# Patient Record
Sex: Female | Born: 1940 | Race: White | Hispanic: No | Smoking: Former smoker
Health system: Southern US, Community
[De-identification: ages and names within clinical notes are randomized; demographics above are authoritative.]

## PROBLEM LIST (undated history)

## (undated) ENCOUNTER — Emergency Department (HOSPITAL_COMMUNITY): Payer: Medicare Other | Source: Home / Self Care

## (undated) DIAGNOSIS — I2699 Other pulmonary embolism without acute cor pulmonale: Secondary | ICD-10-CM

## (undated) DIAGNOSIS — G8929 Other chronic pain: Secondary | ICD-10-CM

## (undated) DIAGNOSIS — M898X9 Other specified disorders of bone, unspecified site: Secondary | ICD-10-CM

## (undated) DIAGNOSIS — K2210 Ulcer of esophagus without bleeding: Secondary | ICD-10-CM

## (undated) DIAGNOSIS — I739 Peripheral vascular disease, unspecified: Secondary | ICD-10-CM

## (undated) DIAGNOSIS — R51 Headache: Secondary | ICD-10-CM

## (undated) DIAGNOSIS — F32A Depression, unspecified: Secondary | ICD-10-CM

## (undated) DIAGNOSIS — T7840XA Allergy, unspecified, initial encounter: Secondary | ICD-10-CM

## (undated) DIAGNOSIS — D649 Anemia, unspecified: Secondary | ICD-10-CM

## (undated) DIAGNOSIS — J449 Chronic obstructive pulmonary disease, unspecified: Secondary | ICD-10-CM

## (undated) DIAGNOSIS — F329 Major depressive disorder, single episode, unspecified: Secondary | ICD-10-CM

## (undated) DIAGNOSIS — M549 Dorsalgia, unspecified: Secondary | ICD-10-CM

## (undated) DIAGNOSIS — M419 Scoliosis, unspecified: Secondary | ICD-10-CM

## (undated) DIAGNOSIS — F319 Bipolar disorder, unspecified: Secondary | ICD-10-CM

## (undated) DIAGNOSIS — M1990 Unspecified osteoarthritis, unspecified site: Secondary | ICD-10-CM

## (undated) DIAGNOSIS — C9000 Multiple myeloma not having achieved remission: Secondary | ICD-10-CM

## (undated) DIAGNOSIS — K219 Gastro-esophageal reflux disease without esophagitis: Secondary | ICD-10-CM

## (undated) DIAGNOSIS — E785 Hyperlipidemia, unspecified: Secondary | ICD-10-CM

## (undated) DIAGNOSIS — R519 Headache, unspecified: Secondary | ICD-10-CM

## (undated) HISTORY — DX: Dorsalgia, unspecified: M54.9

## (undated) HISTORY — PX: EYE SURGERY: SHX253

## (undated) HISTORY — PX: COLONOSCOPY: SHX174

## (undated) HISTORY — DX: Other pulmonary embolism without acute cor pulmonale: I26.99

## (undated) HISTORY — PX: ABDOMINAL HYSTERECTOMY: SHX81

## (undated) HISTORY — DX: Scoliosis, unspecified: M41.9

## (undated) HISTORY — PX: LAMINECTOMY: SHX219

## (undated) HISTORY — DX: Allergy, unspecified, initial encounter: T78.40XA

## (undated) HISTORY — PX: ANKLE SURGERY: SHX546

## (undated) HISTORY — PX: NASAL SINUS SURGERY: SHX719

## (undated) HISTORY — DX: Ulcer of esophagus without bleeding: K22.10

## (undated) HISTORY — DX: Other chronic pain: G89.29

## (undated) HISTORY — DX: Headache: R51

## (undated) HISTORY — DX: Other specified disorders of bone, unspecified site: M89.8X9

## (undated) HISTORY — DX: Hyperlipidemia, unspecified: E78.5

## (undated) HISTORY — DX: Headache, unspecified: R51.9

## (undated) HISTORY — PX: OTHER SURGICAL HISTORY: SHX169

---

## 1999-04-18 ENCOUNTER — Ambulatory Visit (HOSPITAL_COMMUNITY): Admission: RE | Admit: 1999-04-18 | Discharge: 1999-04-18 | Payer: Self-pay | Admitting: Family Medicine

## 1999-04-18 ENCOUNTER — Encounter: Payer: Self-pay | Admitting: Family Medicine

## 2009-02-23 ENCOUNTER — Ambulatory Visit: Payer: Self-pay | Admitting: Diagnostic Radiology

## 2009-02-23 ENCOUNTER — Ambulatory Visit (HOSPITAL_BASED_OUTPATIENT_CLINIC_OR_DEPARTMENT_OTHER): Admission: RE | Admit: 2009-02-23 | Discharge: 2009-02-23 | Payer: Self-pay | Admitting: Family Medicine

## 2012-10-29 ENCOUNTER — Ambulatory Visit: Payer: Medicare Other | Attending: Urology | Admitting: Physical Therapy

## 2012-10-29 DIAGNOSIS — IMO0001 Reserved for inherently not codable concepts without codable children: Secondary | ICD-10-CM | POA: Insufficient documentation

## 2012-10-29 DIAGNOSIS — M545 Low back pain, unspecified: Secondary | ICD-10-CM | POA: Insufficient documentation

## 2012-10-29 DIAGNOSIS — M2569 Stiffness of other specified joint, not elsewhere classified: Secondary | ICD-10-CM | POA: Insufficient documentation

## 2014-03-16 ENCOUNTER — Encounter: Payer: Self-pay | Admitting: Podiatry

## 2014-03-16 ENCOUNTER — Ambulatory Visit (INDEPENDENT_AMBULATORY_CARE_PROVIDER_SITE_OTHER): Payer: Medicare Other

## 2014-03-16 ENCOUNTER — Ambulatory Visit (INDEPENDENT_AMBULATORY_CARE_PROVIDER_SITE_OTHER): Payer: Medicare Other | Admitting: Podiatry

## 2014-03-16 VITALS — BP 120/74 | HR 85 | Resp 16 | Ht 60.0 in | Wt 160.0 lb

## 2014-03-16 DIAGNOSIS — M2040 Other hammer toe(s) (acquired), unspecified foot: Secondary | ICD-10-CM

## 2014-03-16 DIAGNOSIS — M21619 Bunion of unspecified foot: Secondary | ICD-10-CM

## 2014-03-16 NOTE — Progress Notes (Signed)
   Subjective:    Patient ID: [name] May, female    DOB: 1941-10-23, 73 y.o.   MRN: [id number]  HPI Comments: Bunions and hammertoes. Both feet . Right one is worse. Its been getting worse      Review of Systems  HENT:       Ringing in ears   Gastrointestinal: Positive for diarrhea.  Musculoskeletal:       Joint pain  Difficulty walking   Neurological: Positive for tremors.  All other systems reviewed and are negative.      Objective:   Physical Exam: I have reviewed her past medical history medications allergies surgeries social history and review of systems. Pulses are strongly palpable bilateral. +2/4 DP and PT bilateral. Capillary fill time to digits one through 5 of the bilateral foot is immediate. Neurologic sensorium is intact per since once the monofilament bilateral. Deep tendon reflexes are brisk and equal bilateral. Muscle strength is 5 over 5 dorsiflexors plantar flexors inverters everters all intrinsic musculature is intact. 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 She has hammertoe deformities of all of the lesser digits with overlapping of the right second toe over the hallux. 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 With the hallux abductovalgus deformity being noted bilateral. Hammertoes are also visible. 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        Assessment & Plan:  Assessment is painful hallux abductovalgus deformity right greater than left secondary to crossover the hammertoe deformity right.  Plan: Discussed etiology pathology conservative versus surgical therapies. We discussed the pros and cons today of osteotomies of the first metatarsal and the second metatarsal with correction of the hammertoe deformity. I expressed to her that this may or may not resolve her issues. We did discuss  the increase healing time and the possibility of fracture or slow healing of the bone secondary to her osteopenia. So I gave her another option of amputation of the second digit of the right foot at this point this is what she would like to do. We discussed the pros and cons of this as well we gave her consent form to review today consisting of amputation and disarticulation of the second digit at the second metatarsophalangeal joint. I answered all the questions regarding this procedure to the best of my ability in layman's terms. She understands that and is amenable to it. I will followup with her in the near future for surgical intervention.

## 2014-03-16 NOTE — Patient Instructions (Signed)
Pre-Operative Instructions  Congratulations, you have decided to take an important step to improving your quality of life.  You can be assured that the doctors of [facility] will be with you every step of the way.  1. Plan to be at the surgery center/hospital at least 1 (one) hour prior to your scheduled time unless otherwise directed by the surgical center/hospital staff.  You must have a responsible adult accompany you, remain during the surgery and drive you home.  Make sure you have directions to the surgical center/hospital and know how to get there on time. 2. For hospital based surgery you will need to obtain a history and physical form from your family physician within 1 month prior to the date of surgery- we will give you a form for you primary physician.  3. We make every effort to accommodate the date you request for surgery.  There are however, times where surgery dates or times have to be moved.  We will contact you as soon as possible if a change in schedule is required.   4. No Aspirin/Ibuprofen for one week before surgery.  If you are on aspirin, any non-steroidal anti-inflammatory medications (Mobic, Aleve, Ibuprofen) you should stop taking it 7 days prior to your surgery.  You make take Tylenol  For pain prior to surgery.  5. Medications- If you are taking daily heart and blood pressure medications, seizure, reflux, allergy, asthma, anxiety, pain or diabetes medications, make sure the surgery center/hospital is aware before the day of surgery so they may notify you which medications to take or avoid the day of surgery. 6. No food or drink after midnight the night before surgery unless directed otherwise by surgical center/hospital staff. 7. No alcoholic beverages 24 hours prior to surgery.  No smoking 24 hours prior to or 24 hours after surgery. 8. Wear loose pants or shorts- loose enough to fit over bandages, boots, and casts. 9. No slip on shoes, sneakers are best. 10. Bring  your boot with you to the surgery center/hospital.  Also bring crutches or a walker if your physician has prescribed it for you.  If you do not have this equipment, it will be provided for you after surgery. 11. If you have not been contracted by the surgery center/hospital by the day before your surgery, call to confirm the date and time of your surgery. 12. Leave-time from work may vary depending on the type of surgery you have.  Appropriate arrangements should be made prior to surgery with your employer. 13. Prescriptions will be provided immediately following surgery by your doctor.  Have these filled as soon as possible after surgery and take the medication as directed. 14. Remove nail polish on the operative foot. 15. Wash the night before surgery.  The night before surgery wash the foot and leg well with the antibacterial soap provided and water paying special attention to beneath the toenails and in between the toes.  Rinse thoroughly with water and dry well with a towel.  Perform this wash unless told not to do so by your physician.  Enclosed: 1 Ice pack (please put in freezer the night before surgery)   1 Hibiclens skin cleaner   Pre-op Instructions  If you have any questions regarding the instructions, do not hesitate to call our office.  [place]: [address] [place], [place] [address] [phone]  [place]: [address] [place], [place] [address] [phone]  [place]: [address]  [place],  [address] [phone]  Dr. Richard   Tuchman DPM, Dr. Norman Regal DPM Dr. Richard Sikora DPM, Dr. M. Todd Hyatt DPM, Dr. Kathryn Egerton DPM 

## 2014-03-24 ENCOUNTER — Telehealth: Payer: Self-pay | Admitting: *Deleted

## 2014-03-24 ENCOUNTER — Encounter: Payer: Self-pay | Admitting: *Deleted

## 2014-03-24 NOTE — Telephone Encounter (Signed)
Medical clearance letter for surgery sent to Dr. Jefm Petty per Dr. Milinda Pointer.  Dr. Jefm Petty [address] [place], [place] [address] [facility]: [phone]

## 2014-04-08 ENCOUNTER — Telehealth: Payer: Self-pay | Admitting: *Deleted

## 2014-04-08 NOTE — Telephone Encounter (Signed)
I need to schedule surgery on my right foot, Bunion and Hammertoe.  I returned her call.  She stated that she needed to get medical clearance per Dr. Milinda Pointer.  She said she saw him on yesterday and he said that she was medically clear to have her procedure.  She stated she was anemic but he said everything looks good.  She asked to schedule surgery on the 12th,19th, or the 26th.  I offerered her the 19th.  She stated that date is perfect it allows her to get some things taken care of.  She asked what do we need from her.  I told her we just need a letter of medical clearance from Dr. Jeralene Huff.  She stated she would get that to [place].

## 2014-04-09 ENCOUNTER — Encounter: Payer: Self-pay | Admitting: *Deleted

## 2014-04-29 ENCOUNTER — Other Ambulatory Visit: Payer: Self-pay | Admitting: Podiatry

## 2014-04-29 MED ORDER — OXYCODONE-ACETAMINOPHEN 10-325 MG PO TABS: 1.0000 | ORAL_TABLET | Freq: Four times a day (QID) | ORAL | Status: DC | PRN

## 2014-04-29 MED ORDER — PROMETHAZINE HCL 12.5 MG PO TABS: 25.0000 mg | ORAL_TABLET | Freq: Four times a day (QID) | ORAL | Status: DC | PRN

## 2014-04-29 MED ORDER — CEPHALEXIN 500 MG PO CAPS: 500.0000 mg | ORAL_CAPSULE | Freq: Three times a day (TID) | ORAL | Status: DC

## 2014-04-30 ENCOUNTER — Telehealth: Payer: Self-pay | Admitting: *Deleted

## 2014-04-30 DIAGNOSIS — M2040 Other hammer toe(s) (acquired), unspecified foot: Secondary | ICD-10-CM

## 2014-04-30 DIAGNOSIS — M79609 Pain in unspecified limb: Secondary | ICD-10-CM

## 2014-04-30 DIAGNOSIS — M21619 Bunion of unspecified foot: Secondary | ICD-10-CM

## 2014-04-30 NOTE — Telephone Encounter (Signed)
I called to ask if Zofran is covered by her insurance.  [name] May informed me that patient went ahead and paid for the Promethazine.

## 2014-04-30 NOTE — Telephone Encounter (Signed)
Promethazine prescription needs prior authorization from patient's insurance.

## 2014-05-04 ENCOUNTER — Telehealth: Payer: Self-pay

## 2014-05-04 NOTE — Telephone Encounter (Signed)
Spoke with pt regarding post op status. She states that her right foot hurts worse at night than during the day, I advised her to take ibuprofen in between her schedule pain medciation doses to help with break through pain. Advised to continue with ice, and elevation and to remain in sterile dressing until her appt. No other issues or concerns at this time.

## 2014-05-06 ENCOUNTER — Encounter: Payer: Self-pay | Admitting: Podiatry

## 2014-05-06 ENCOUNTER — Ambulatory Visit (INDEPENDENT_AMBULATORY_CARE_PROVIDER_SITE_OTHER): Payer: Medicare Other | Admitting: Podiatry

## 2014-05-06 ENCOUNTER — Ambulatory Visit (INDEPENDENT_AMBULATORY_CARE_PROVIDER_SITE_OTHER): Payer: Medicare Other

## 2014-05-06 VITALS — BP 138/78 | HR 100 | Resp 12

## 2014-05-06 DIAGNOSIS — M21619 Bunion of unspecified foot: Secondary | ICD-10-CM

## 2014-05-06 DIAGNOSIS — M21611 Bunion of right foot: Secondary | ICD-10-CM

## 2014-05-06 DIAGNOSIS — Z9889 Other specified postprocedural states: Secondary | ICD-10-CM

## 2014-05-06 NOTE — Progress Notes (Signed)
She presents today one week status post McBride bunion repair right foot and indication second digit of the right foot. She states that she has a sore spot on the bottom of her foot to need for her toe was. She denies fever chills nausea vomiting muscle aches and pains.  Objective: Vital signs are stable she is alert and oriented x3. Once the rest of dressing was removed demonstrates no erythema mild edema no cellulitis drainage or odor she does have a superficial wound to the plantar aspect of the second metatarsophalangeal joint area orientation site was more than likely this little blood blister was secondary to irritation from dressing material. Radiographic evaluation confirms good McBride bunion repair and removal of second toe.  Assessment: Redressed today dressed a compressive dressing will followup with me in one week. Encouraged range of motion exercises.  Plan:

## 2014-05-06 NOTE — Progress Notes (Signed)
Dr. Milinda Pointer performed at right [facility] bunionectomy and right 2nd amputation toe MPJ joint DOS 04/30/14

## 2014-05-13 ENCOUNTER — Encounter: Payer: Self-pay | Admitting: Podiatry

## 2014-05-13 ENCOUNTER — Ambulatory Visit (INDEPENDENT_AMBULATORY_CARE_PROVIDER_SITE_OTHER): Payer: Medicare Other | Admitting: Podiatry

## 2014-05-13 VITALS — BP 137/85 | HR 86 | Temp 97.9°F | Resp 16

## 2014-05-13 DIAGNOSIS — Z9889 Other specified postprocedural states: Secondary | ICD-10-CM

## 2014-05-13 NOTE — Progress Notes (Signed)
She presents today 2 weeks status post a dry bunion repair of the right foot and a amputation second digit of the right foot. She states that they feel a little tingling. She denies fever chills nausea vomiting muscle aches and pains.  Objective: Vital signs are stable she is alert and oriented x3. Sutures are intact and margins appear to be well coapted sutures were removed today margins remain well coapted. There is minimal edema no erythema saline is drainage or odor. She has good range of motion of the first metatarsophalangeal joint of the right foot.  Assessment: Well-healing surgical foot right. Status post amputation second digit right foot at the metatarsophalangeal joint. Bunion repair right foot.  Plan: Discussed etiology pathology conservative versus surgical therapies. Sutures were removed today I placed her in a Darco shoe and will followup with her in 2 weeks.

## 2014-05-27 ENCOUNTER — Ambulatory Visit (INDEPENDENT_AMBULATORY_CARE_PROVIDER_SITE_OTHER): Payer: Medicare Other

## 2014-05-27 ENCOUNTER — Ambulatory Visit (INDEPENDENT_AMBULATORY_CARE_PROVIDER_SITE_OTHER): Payer: Medicare Other | Admitting: Podiatry

## 2014-05-27 VITALS — BP 92/49 | HR 85 | Resp 15 | Ht 61.0 in | Wt 160.0 lb

## 2014-05-27 DIAGNOSIS — M2011 Hallux valgus (acquired), right foot: Secondary | ICD-10-CM

## 2014-05-27 DIAGNOSIS — Z9889 Other specified postprocedural states: Secondary | ICD-10-CM

## 2014-05-27 DIAGNOSIS — M2010 Hallux valgus (acquired), unspecified foot: Secondary | ICD-10-CM

## 2014-05-27 NOTE — Progress Notes (Signed)
She presents today 3 weeks status post a dry bunion repair with amputation second digit of the right foot. She denies fever chills nausea vomiting muscle aches and pains and states it is starting become easier for her to the foot down.  Objective: Vital signs are stable she is alert and oriented x3. Pulses are palpable right. Incision site to bone to heal very nicely and the swelling has come down considerably.  Assessment: Well-healing surgical foot right.  Plan: I encouraged her to get into a pair of tennis shoes or loosefitting slides. I will followup with her in 2-4 weeks.

## 2014-06-01 ENCOUNTER — Encounter: Payer: Medicare Other | Admitting: Podiatry

## 2014-06-10 ENCOUNTER — Ambulatory Visit (INDEPENDENT_AMBULATORY_CARE_PROVIDER_SITE_OTHER): Payer: Medicare Other | Admitting: Podiatry

## 2014-06-10 ENCOUNTER — Encounter: Payer: Self-pay | Admitting: Podiatry

## 2014-06-10 ENCOUNTER — Ambulatory Visit (INDEPENDENT_AMBULATORY_CARE_PROVIDER_SITE_OTHER): Payer: Medicare Other

## 2014-06-10 VITALS — BP 101/55 | HR 89 | Resp 16

## 2014-06-10 DIAGNOSIS — Z9889 Other specified postprocedural states: Secondary | ICD-10-CM

## 2014-06-10 DIAGNOSIS — M2010 Hallux valgus (acquired), unspecified foot: Secondary | ICD-10-CM

## 2014-06-10 DIAGNOSIS — M2011 Hallux valgus (acquired), right foot: Secondary | ICD-10-CM

## 2014-06-10 NOTE — Progress Notes (Signed)
She presents today for her final postop visit regarding her right bunion repair and amputation second digit right foot. She denies fever chills nausea vomiting muscle aches and pains and states that it and she just fine.  Objective: Vital signs are stable she is alert and oriented x3. Surgical foot demonstrates no erythema edema cellulitis drainage or odor.  Assessment: Well-healing surgical foot right.  Plan: Get back to her regular routine followup with me as needed.

## 2015-02-06 ENCOUNTER — Inpatient Hospital Stay (HOSPITAL_BASED_OUTPATIENT_CLINIC_OR_DEPARTMENT_OTHER)
Admission: EM | Admit: 2015-02-06 | Discharge: 2015-02-15 | DRG: 166 | Disposition: A | Discharge status: Home / Self Care | Payer: Medicare Other | Attending: Internal Medicine | Admitting: Internal Medicine

## 2015-02-06 ENCOUNTER — Emergency Department (HOSPITAL_BASED_OUTPATIENT_CLINIC_OR_DEPARTMENT_OTHER): Payer: Medicare Other

## 2015-02-06 ENCOUNTER — Encounter (HOSPITAL_BASED_OUTPATIENT_CLINIC_OR_DEPARTMENT_OTHER): Payer: Self-pay | Admitting: *Deleted

## 2015-02-06 DIAGNOSIS — R197 Diarrhea, unspecified: Secondary | ICD-10-CM | POA: Diagnosis present

## 2015-02-06 DIAGNOSIS — N179 Acute kidney failure, unspecified: Secondary | ICD-10-CM | POA: Diagnosis present

## 2015-02-06 DIAGNOSIS — D509 Iron deficiency anemia, unspecified: Secondary | ICD-10-CM | POA: Diagnosis present

## 2015-02-06 DIAGNOSIS — I82403 Acute embolism and thrombosis of unspecified deep veins of lower extremity, bilateral: Secondary | ICD-10-CM | POA: Diagnosis not present

## 2015-02-06 DIAGNOSIS — I82409 Acute embolism and thrombosis of unspecified deep veins of unspecified lower extremity: Secondary | ICD-10-CM | POA: Insufficient documentation

## 2015-02-06 DIAGNOSIS — R062 Wheezing: Secondary | ICD-10-CM

## 2015-02-06 DIAGNOSIS — G2581 Restless legs syndrome: Secondary | ICD-10-CM | POA: Diagnosis present

## 2015-02-06 DIAGNOSIS — J9601 Acute respiratory failure with hypoxia: Secondary | ICD-10-CM | POA: Diagnosis present

## 2015-02-06 DIAGNOSIS — E785 Hyperlipidemia, unspecified: Secondary | ICD-10-CM | POA: Diagnosis present

## 2015-02-06 DIAGNOSIS — R7989 Other specified abnormal findings of blood chemistry: Secondary | ICD-10-CM | POA: Diagnosis not present

## 2015-02-06 DIAGNOSIS — R748 Abnormal levels of other serum enzymes: Secondary | ICD-10-CM | POA: Diagnosis present

## 2015-02-06 DIAGNOSIS — Z90710 Acquired absence of both cervix and uterus: Secondary | ICD-10-CM

## 2015-02-06 DIAGNOSIS — K219 Gastro-esophageal reflux disease without esophagitis: Secondary | ICD-10-CM | POA: Diagnosis present

## 2015-02-06 DIAGNOSIS — J439 Emphysema, unspecified: Secondary | ICD-10-CM | POA: Diagnosis present

## 2015-02-06 DIAGNOSIS — Z791 Long term (current) use of non-steroidal anti-inflammatories (NSAID): Secondary | ICD-10-CM | POA: Diagnosis not present

## 2015-02-06 DIAGNOSIS — F319 Bipolar disorder, unspecified: Secondary | ICD-10-CM | POA: Diagnosis present

## 2015-02-06 DIAGNOSIS — R0902 Hypoxemia: Secondary | ICD-10-CM | POA: Diagnosis not present

## 2015-02-06 DIAGNOSIS — D649 Anemia, unspecified: Secondary | ICD-10-CM | POA: Diagnosis present

## 2015-02-06 DIAGNOSIS — I2699 Other pulmonary embolism without acute cor pulmonale: Secondary | ICD-10-CM | POA: Diagnosis present

## 2015-02-06 DIAGNOSIS — I517 Cardiomegaly: Secondary | ICD-10-CM | POA: Diagnosis not present

## 2015-02-06 DIAGNOSIS — D6851 Activated protein C resistance: Secondary | ICD-10-CM | POA: Diagnosis present

## 2015-02-06 DIAGNOSIS — R0602 Shortness of breath: Secondary | ICD-10-CM | POA: Insufficient documentation

## 2015-02-06 DIAGNOSIS — M419 Scoliosis, unspecified: Secondary | ICD-10-CM | POA: Diagnosis present

## 2015-02-06 DIAGNOSIS — Z87891 Personal history of nicotine dependence: Secondary | ICD-10-CM

## 2015-02-06 DIAGNOSIS — Z79899 Other long term (current) drug therapy: Secondary | ICD-10-CM | POA: Diagnosis not present

## 2015-02-06 DIAGNOSIS — R778 Other specified abnormalities of plasma proteins: Secondary | ICD-10-CM | POA: Diagnosis present

## 2015-02-06 HISTORY — DX: Major depressive disorder, single episode, unspecified: F32.9

## 2015-02-06 HISTORY — DX: Depression, unspecified: F32.A

## 2015-02-06 HISTORY — DX: Anemia, unspecified: D64.9

## 2015-02-06 HISTORY — DX: Bipolar disorder, unspecified: F31.9

## 2015-02-06 LAB — HEPARIN LEVEL (UNFRACTIONATED): Heparin Unfractionated: 0.36 IU/mL (ref 0.30–0.70)

## 2015-02-06 LAB — BASIC METABOLIC PANEL
Anion gap: 10 (ref 5–15)
BUN: 22 mg/dL (ref 6–23)
CHLORIDE: 103 mmol/L (ref 96–112)
CO2: 23 mmol/L (ref 19–32)
Calcium: 8.8 mg/dL (ref 8.4–10.5)
Creatinine, Ser: 1.39 mg/dL — ABNORMAL HIGH (ref 0.50–1.10)
GFR calc non Af Amer: 37 mL/min — ABNORMAL LOW (ref >90)
GFR, EST AFRICAN AMERICAN: 43 mL/min — AB (ref >90)
GLUCOSE: 116 mg/dL — AB (ref 70–99)
Potassium: 3.8 mmol/L (ref 3.5–5.1)
Sodium: 136 mmol/L (ref 135–145)

## 2015-02-06 LAB — CBC
HEMATOCRIT: 30.3 % — AB (ref 36.0–46.0)
Hemoglobin: 8.8 g/dL — ABNORMAL LOW (ref 12.0–15.0)
MCH: 23.6 pg — ABNORMAL LOW (ref 26.0–34.0)
MCHC: 29 g/dL — AB (ref 30.0–36.0)
MCV: 81.2 fL (ref 78.0–100.0)
PLATELETS: 278 10*3/uL (ref 150–400)
RBC: 3.73 MIL/uL — AB (ref 3.87–5.11)
RDW: 16.6 % — ABNORMAL HIGH (ref 11.5–15.5)
WBC: 9.6 10*3/uL (ref 4.0–10.5)

## 2015-02-06 LAB — I-STAT ARTERIAL BLOOD GAS, ED
ACID-BASE DEFICIT: 2 mmol/L (ref 0.0–2.0)
Bicarbonate: 21.7 mEq/L (ref 20.0–24.0)
O2 SAT: 90 %
TCO2: 23 mmol/L (ref 0–100)
pCO2 arterial: 31.1 mmHg — ABNORMAL LOW (ref 35.0–45.0)
pH, Arterial: 7.452 — ABNORMAL HIGH (ref 7.350–7.450)
pO2, Arterial: 56 mmHg — ABNORMAL LOW (ref 80.0–100.0)

## 2015-02-06 LAB — BRAIN NATRIURETIC PEPTIDE: B NATRIURETIC PEPTIDE 5: 693.7 pg/mL — AB (ref 0.0–100.0)

## 2015-02-06 LAB — MRSA PCR SCREENING: MRSA BY PCR: NEGATIVE

## 2015-02-06 LAB — TROPONIN I
Troponin I: 0.09 ng/mL — ABNORMAL HIGH (ref <0.031)
Troponin I: 0.07 ng/mL — ABNORMAL HIGH (ref <0.031)

## 2015-02-06 LAB — ANTITHROMBIN III: AntiThromb III Func: 81 % (ref 75–120)

## 2015-02-06 MED ORDER — ZOLPIDEM TARTRATE 5 MG PO TABS
5.0000 mg | ORAL_TABLET | Freq: Every evening | ORAL | Status: DC | PRN
  Filled 2015-02-06: qty 1

## 2015-02-06 MED ORDER — CALCIUM CARBONATE-VITAMIN D 500-200 MG-UNIT PO TABS
1.0000 | ORAL_TABLET | Freq: Every day | ORAL | Status: DC
  Administered 2015-02-07 – 2015-02-14 (×8): 1 via ORAL
  Filled 2015-02-06 (×11): qty 1

## 2015-02-06 MED ORDER — PANTOPRAZOLE SODIUM 40 MG PO TBEC
40.0000 mg | DELAYED_RELEASE_TABLET | Freq: Two times a day (BID) | ORAL | Status: DC | PRN
  Administered 2015-02-11 – 2015-02-15 (×2): 40 mg via ORAL
  Filled 2015-02-06 (×2): qty 1

## 2015-02-06 MED ORDER — LOPERAMIDE HCL 2 MG PO TABS: 4.0000 mg | ORAL_TABLET | Freq: Every day | ORAL | Status: DC | PRN

## 2015-02-06 MED ORDER — GABAPENTIN 300 MG PO CAPS
600.0000 mg | ORAL_CAPSULE | Freq: Three times a day (TID) | ORAL | Status: DC
  Filled 2015-02-06: qty 2

## 2015-02-06 MED ORDER — GABAPENTIN 300 MG PO CAPS
600.0000 mg | ORAL_CAPSULE | Freq: Three times a day (TID) | ORAL | Status: DC
  Administered 2015-02-06 – 2015-02-08 (×6): 600 mg via ORAL
  Filled 2015-02-06 (×8): qty 2

## 2015-02-06 MED ORDER — SODIUM CHLORIDE 0.9 % IV SOLN
INTRAVENOUS | Status: DC
  Administered 2015-02-06: 18:00:00 via INTRAVENOUS
  Administered 2015-02-06: 75 mL via INTRAVENOUS

## 2015-02-06 MED ORDER — LORATADINE 10 MG PO TABS
10.0000 mg | ORAL_TABLET | Freq: Every day | ORAL | Status: DC
  Administered 2015-02-07 – 2015-02-15 (×9): 10 mg via ORAL
  Filled 2015-02-06 (×9): qty 1

## 2015-02-06 MED ORDER — ONDANSETRON HCL 4 MG PO TABS: 4.0000 mg | ORAL_TABLET | Freq: Four times a day (QID) | ORAL | Status: DC | PRN

## 2015-02-06 MED ORDER — ZOLPIDEM TARTRATE 5 MG PO TABS
5.0000 mg | ORAL_TABLET | Freq: Every day | ORAL | Status: DC
  Administered 2015-02-06 – 2015-02-09 (×2): 5 mg via ORAL
  Filled 2015-02-06 (×6): qty 1

## 2015-02-06 MED ORDER — ROPINIROLE HCL 1 MG PO TABS
2.0000 mg | ORAL_TABLET | ORAL | Status: DC
  Administered 2015-02-06 – 2015-02-14 (×18): 2 mg via ORAL
  Filled 2015-02-06 (×22): qty 2

## 2015-02-06 MED ORDER — BUPROPION HCL ER (XL) 300 MG PO TB24
300.0000 mg | ORAL_TABLET | Freq: Every day | ORAL | Status: DC
  Administered 2015-02-07 – 2015-02-15 (×9): 300 mg via ORAL
  Filled 2015-02-06 (×9): qty 1

## 2015-02-06 MED ORDER — SODIUM CHLORIDE 0.9 % IJ SOLN
3.0000 mL | Freq: Two times a day (BID) | INTRAMUSCULAR | Status: DC
  Administered 2015-02-06 – 2015-02-14 (×9): 3 mL via INTRAVENOUS

## 2015-02-06 MED ORDER — IOHEXOL 350 MG/ML SOLN
80.0000 mL | Freq: Once | INTRAVENOUS | Status: AC | PRN
  Administered 2015-02-06: 80 mL via INTRAVENOUS

## 2015-02-06 MED ORDER — POLYSACCHARIDE IRON COMPLEX 150 MG PO CAPS
150.0000 mg | ORAL_CAPSULE | Freq: Every day | ORAL | Status: DC
  Administered 2015-02-07 – 2015-02-15 (×9): 150 mg via ORAL
  Filled 2015-02-06 (×9): qty 1

## 2015-02-06 MED ORDER — CETYLPYRIDINIUM CHLORIDE 0.05 % MT LIQD
7.0000 mL | Freq: Two times a day (BID) | OROMUCOSAL | Status: DC
  Administered 2015-02-06 – 2015-02-14 (×12): 7 mL via OROMUCOSAL

## 2015-02-06 MED ORDER — GABAPENTIN 600 MG PO TABS: 600.0000 mg | ORAL_TABLET | Freq: Three times a day (TID) | ORAL | Status: DC

## 2015-02-06 MED ORDER — HYDROCODONE-ACETAMINOPHEN 5-325 MG PO TABS
2.0000 | ORAL_TABLET | Freq: Four times a day (QID) | ORAL | Status: DC | PRN
  Administered 2015-02-07 – 2015-02-15 (×11): 2 via ORAL
  Filled 2015-02-06 (×12): qty 2

## 2015-02-06 MED ORDER — LAMOTRIGINE 200 MG PO TABS
200.0000 mg | ORAL_TABLET | Freq: Every day | ORAL | Status: DC
  Administered 2015-02-06 – 2015-02-14 (×9): 200 mg via ORAL
  Filled 2015-02-06 (×11): qty 1

## 2015-02-06 MED ORDER — CALCIUM CARBONATE-VITAMIN D 250-125 MG-UNIT PO TABS: 1.0000 | ORAL_TABLET | Freq: Every day | ORAL | Status: DC

## 2015-02-06 MED ORDER — NITROGLYCERIN 0.4 MG SL SUBL
SUBLINGUAL_TABLET | SUBLINGUAL | Status: AC
  Administered 2015-02-06: 0.4 mg via SUBLINGUAL
  Filled 2015-02-06: qty 3

## 2015-02-06 MED ORDER — HEPARIN BOLUS VIA INFUSION
4000.0000 [IU] | Freq: Once | INTRAVENOUS | Status: AC
  Administered 2015-02-06: 4000 [IU] via INTRAVENOUS

## 2015-02-06 MED ORDER — ONDANSETRON HCL 4 MG/2ML IJ SOLN: 4.0000 mg | Freq: Four times a day (QID) | INTRAMUSCULAR | Status: DC | PRN

## 2015-02-06 MED ORDER — LOPERAMIDE HCL 2 MG PO CAPS: 4.0000 mg | ORAL_CAPSULE | Freq: Every day | ORAL | Status: DC | PRN

## 2015-02-06 MED ORDER — ROPINIROLE HCL 1 MG PO TABS
1.0000 mg | ORAL_TABLET | Freq: Every day | ORAL | Status: DC
  Administered 2015-02-07 – 2015-02-15 (×9): 1 mg via ORAL
  Filled 2015-02-06 (×10): qty 1

## 2015-02-06 MED ORDER — ACETAMINOPHEN 325 MG PO TABS: 650.0000 mg | ORAL_TABLET | Freq: Four times a day (QID) | ORAL | Status: DC | PRN

## 2015-02-06 MED ORDER — NITROGLYCERIN 0.4 MG SL SUBL
0.4000 mg | SUBLINGUAL_TABLET | SUBLINGUAL | Status: DC | PRN
Start: 2015-02-06 — End: 2015-02-15
  Administered 2015-02-06: 0.4 mg via SUBLINGUAL

## 2015-02-06 MED ORDER — ROPINIROLE HCL 1 MG PO TABS: 2.0000 mg | ORAL_TABLET | Freq: Two times a day (BID) | ORAL | Status: DC

## 2015-02-06 MED ORDER — HEPARIN (PORCINE) IN NACL 100-0.45 UNIT/ML-% IJ SOLN
1100.0000 [IU]/h | INTRAMUSCULAR | Status: DC
  Administered 2015-02-06 (×2): 1000 [IU]/h via INTRAVENOUS
  Filled 2015-02-06 (×2): qty 250

## 2015-02-06 MED ORDER — ASPIRIN 81 MG PO CHEW
324.0000 mg | CHEWABLE_TABLET | Freq: Once | ORAL | Status: AC
  Administered 2015-02-06: 324 mg via ORAL
  Filled 2015-02-06: qty 4

## 2015-02-06 MED ORDER — ACETAMINOPHEN 650 MG RE SUPP: 650.0000 mg | Freq: Four times a day (QID) | RECTAL | Status: DC | PRN

## 2015-02-06 MED ORDER — ROPINIROLE HCL 1 MG PO TABS
2.0000 mg | ORAL_TABLET | ORAL | Status: DC
  Filled 2015-02-06: qty 2

## 2015-02-06 NOTE — Progress Notes (Signed)
ANTICOAGULATION CONSULT NOTE - Follow Up Consult  Pharmacy Consult for heparin Indication: pulmonary embolus  No Known Allergies  Patient Measurements: Height: 5' (152.4 cm) Weight: 172 lb 2.9 oz (78.1 kg) IBW/kg (Calculated) : 45.5 Heparin Dosing Weight: 64 kg  Vital Signs: Temp: 97.8 F (36.6 C) (03/27 1600) Temp Source: Oral (03/27 1600) BP: 134/86 mmHg (03/27 1600) Pulse Rate: 86 (03/27 1600)  Labs:  Recent Labs  02/06/15 1010 02/06/15 1620  HGB 8.8*  --   HCT 30.3*  --   PLT 278  --   HEPARINUNFRC  --  0.36  CREATININE 1.39*  --   TROPONINI 0.09*  --     Estimated Creatinine Clearance: 33.8 mL/min (by C-G formula based on Cr of 1.39).   Medications:  Scheduled:  . [START ON 02/07/2015] buPROPion  300 mg Oral Daily  . [START ON 02/07/2015] calcium-vitamin D  1 tablet Oral Q breakfast  . gabapentin  600 mg Oral TID  . [START ON 02/07/2015] iron polysaccharides  150 mg Oral Daily  . lamoTRIgine  200 mg Oral QHS  . [START ON 02/07/2015] loratadine  10 mg Oral Daily  . [START ON 02/07/2015] rOPINIRole  1 mg Oral Daily  . rOPINIRole  2 mg Oral 2 times per day  . sodium chloride  3 mL Intravenous Q12H  . zolpidem  5 mg Oral QHS   Infusions:  . sodium chloride 75 mL/hr at 02/06/15 1739  . heparin 1,000 Units/hr (02/06/15 1739)    Assessment: 74 yo female with PE is currently on therapeutic heparin but heparin level is on low side at 0.36. Goal of Therapy:  Heparin level 0.3-0.7 units/ml Monitor platelets by anticoagulation protocol: Yes   Plan:  - increase heparin to 1100 units/hr - 8 hrs heparin level  Chou Busler, Tsz-Yin 02/06/2015,5:39 PM

## 2015-02-06 NOTE — ED Notes (Signed)
Pt reports chest pain 5/10, new pain "tightness"- EDP notified- obtaining repeat ekg

## 2015-02-06 NOTE — Progress Notes (Signed)
Patient transferred from [facility], Dr. Canary Brim.  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 CT of the chest was conducted showing PE with right heart strain. Patient was also found to have acute respiratory failure however saturations above 92% on oxygen. Patient's vitals are currently stable. Dr. Canary Brim did speak with critical care who stated patient could go to stepdown. Currently on heparin drip. Patient also complained of chest pain and had elevated troponin for possibly secondary to PE.  Accepted, stepdown unit, inpatient at [facility].  Time spent: 5 minutes  Delanie Tirrell D.O. [facility] Pager [phone]  If 7PM-7AM, please contact night-coverage [email or web address] Password [facility] 02/06/2015, 12:25 PM

## 2015-02-06 NOTE — ED Notes (Signed)
MD at bedside. 

## 2015-02-06 NOTE — ED Provider Notes (Signed)
CSN: [id number]     Arrival date & time 02/06/15  0945 History   First MD Initiated Contact with Patient 02/06/15 [phone]     Chief Complaint  Patient presents with  . Shortness of Breath     (Consider location/radiation/quality/duration/timing/severity/associated sxs/prior Treatment) HPI  Pt presenting with c/o shortness of breath, worse when moving around.  Symptoms began 2 days ago.  Pt denies chest pain.  States she has had some chronic swelling in her legs but this is no worse than usual.  Her daugther came to her home today and saw how short of breath she appeared and brought her to the ED.  No recent travel.  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  Denies cough or fever.  No nausea, no back pain.  No syncope or palpitations.  Symptoms are constant, better with rest worse with exertion.  There are no other associated systemic symptoms, there are no other alleviating or modifying factors.   Past Medical History  Diagnosis Date  . Degenerative disorder of bone   . Scoliosis   . Esophageal ulcer   . Allergy   . Hyperlipidemia   . Chronic headaches   . Back pain   . Pulmonary embolism   . Anemia   . Depression   . Bipolar disorder    Past Surgical History  Procedure Laterality Date  . Ankle surgery    . Laminectomy    . Nasal sinus surgery    . Abdominal hysterectomy     History reviewed. No pertinent family history. History  Substance Use Topics  . Smoking status: Former Research scientist (life sciences)  . Smokeless tobacco: Never Used     Comment: quit 22 years ago   . Alcohol Use: No   OB History    No data available     Review of Systems  ROS reviewed and all otherwise negative except for mentioned in HPI    Allergies  Review of patient's allergies indicates no known allergies.  Home Medications   Prior to Admission medications   Medication Sig Start Date End Date Taking? Authorizing Provider  buPROPion (WELLBUTRIN XL) 300 MG 24 hr tablet Take 300 mg by  mouth daily.  02/25/14  Yes Historical Provider, MD  Calcium Carbonate-Vitamin D (CALCIUM + D PO) Take 1 tablet by mouth daily.    Yes Historical Provider, MD  clorazepate (TRANXENE) 3.75 MG tablet Take 3.75 mg by mouth 2 (two) times daily. 02/02/15  Yes Historical Provider, MD  furosemide (LASIX) 20 MG tablet Take 20 mg by mouth daily as needed for fluid or edema.    Yes Historical Provider, MD  gabapentin (NEURONTIN) 600 MG tablet Take 600 mg by mouth 3 (three) times daily. 15:00, 18:00, and 21:00 02/25/14  Yes Historical Provider, MD  hydrocortisone 2.5 % cream Apply 1 application topically daily as needed (eczema).  01/06/14  Yes Historical Provider, MD  ibuprofen (ADVIL,MOTRIN) 600 MG tablet Take 600 mg by mouth every 6 (six) hours as needed for moderate pain.    Yes Historical Provider, MD  iron polysaccharides (NU-IRON) 150 MG capsule Take 150 mg by mouth daily.   Yes Historical Provider, MD  lamoTRIgine (LAMICTAL) 200 MG tablet Take 200 mg by mouth at bedtime.  02/20/14  Yes Historical Provider, MD  loperamide (IMODIUM A-D) 2 MG tablet Take 4-6 mg by mouth daily as needed for diarrhea or loose stools.   Yes Historical Provider, MD  loratadine (CLARITIN) 10 MG tablet  Take 10 mg by mouth daily.   Yes Historical Provider, MD  pantoprazole (PROTONIX) 40 MG tablet Take 40 mg by mouth 2 (two) times daily as needed (acid reflux).  01/24/14  Yes Historical Provider, MD  rOPINIRole (REQUIP) 1 MG tablet Take 1 mg by mouth every morning.  12/15/14  Yes Historical Provider, MD  rOPINIRole (REQUIP) 2 MG tablet Take 2 mg by mouth 2 (two) times daily.  02/25/14  Yes Historical Provider, MD  zolpidem (AMBIEN) 5 MG tablet Take 5 mg by mouth at bedtime as needed for sleep.  01/31/15  Yes Historical Provider, MD  oxyCODONE-acetaminophen (PERCOCET) 10-325 MG per tablet Take 1 tablet by mouth every 6 (six) hours as needed for pain. Patient not taking: Reported on 02/06/2015 04/29/14   Max T Hyatt, DPM  promethazine  (PHENERGAN) 12.5 MG tablet Take 2 tablets (25 mg total) by mouth every 6 (six) hours as needed for nausea or vomiting. Patient not taking: Reported on 02/06/2015 04/29/14   Max T Hyatt, DPM  zolpidem (AMBIEN) 10 MG tablet  02/22/14   Historical Provider, MD   BP 127/75 mmHg  Pulse 95  Temp(Src) 98.3 F (36.8 C) (Oral)  Resp 19  Ht 5' (1.524 m)  Wt 172 lb 2.9 oz (78.1 kg)  BMI 33.63 kg/m2  SpO2 95%  Vitals reviewed Physical Exam  Physical Examination: General appearance - alert, ill appearing, and in no distress Mental status - alert, oriented to person, place, and time Eyes - no conjunctival injection, no scleral icterus Mouth - mucous membranes moist, pharynx normal without lesions Chest - clear to auscultation, no wheezes, rales or rhonchi, symmetric air entry, increased respiratory effort Heart - tachycardic  rate, regular rhythm, normal S1, S2, no murmurs, rubs, clicks or gallops Abdomen - soft, nontender, nondistended, no masses or organomegaly Extremities - peripheral pulses normal, 1+ bilateral pedal edema- chronic per patient, no clubbing, bilateral hands cyanotic- improved after O2 administration Skin - normal coloration and turgor, no rashes  ED Course  Procedures (including critical care time)  10:59 AM pt now c/o chest pain.  Given aspirin, will give nitroglycerin.  Troponin has just resulted as elevated as well- will go ahead and start heparin, awaiting CT angio chest to r/o pe.    11:10 AM ABG shows po2 56, increased O2.  Pt has complete relief of chest pain after one nitro.  12:03 PM large PEs on CT scan.  Code PE pager called. Family and patient updated.    4:23 PM d/w Dr. Nelda Marseille, PCCM- he states patient is able to be admitted to stepdown to hospitalist service.  Paging them now.    12:27 PM d/w Dr. Ree Kida, triad for admission to step down.    CRITICAL CARE Performed by: Threasa Beards Total critical care time: 75 Critical care time was exclusive of separately  billable procedures and treating other patients. Critical care was necessary to treat or prevent imminent or life-threatening deterioration. Critical care was time spent personally by me on the following activities: development of treatment plan with patient and/or surrogate as well as nursing, discussions with consultants, evaluation of patient's response to treatment, examination of patient, obtaining history from patient or surrogate, ordering and performing treatments and interventions, ordering and review of laboratory studies, ordering and review of radiographic studies, pulse oximetry and re-evaluation of patient's condition. Labs Review Labs Reviewed  CBC - Abnormal; Notable for the following:    RBC 3.73 (*)    Hemoglobin 8.8 (*)    HCT  30.3 (*)    MCH 23.6 (*)    MCHC 29.0 (*)    RDW 16.6 (*)    All other components within normal limits  BASIC METABOLIC PANEL - Abnormal; Notable for the following:    Glucose, Bld 116 (*)    Creatinine, Ser 1.39 (*)    GFR calc non Af Amer 37 (*)    GFR calc Af Amer 43 (*)    All other components within normal limits  TROPONIN I - Abnormal; Notable for the following:    Troponin I 0.09 (*)    All other components within normal limits  BRAIN NATRIURETIC PEPTIDE - Abnormal; Notable for the following:    B Natriuretic Peptide 693.7 (*)    All other components within normal limits  TROPONIN I - Abnormal; Notable for the following:    Troponin I 0.07 (*)    All other components within normal limits  TROPONIN I - Abnormal; Notable for the following:    Troponin I 0.05 (*)    All other components within normal limits  CBC - Abnormal; Notable for the following:    RBC 3.32 (*)    Hemoglobin 7.7 (*)    HCT 26.7 (*)    MCH 23.2 (*)    MCHC 28.8 (*)    RDW 16.5 (*)    All other components within normal limits  BASIC METABOLIC PANEL - Abnormal; Notable for the following:    Glucose, Bld 111 (*)    Calcium 8.2 (*)    GFR calc non Af Amer 54 (*)     GFR calc Af Amer 62 (*)    All other components within normal limits  HEPARIN LEVEL (UNFRACTIONATED) - Abnormal; Notable for the following:    Heparin Unfractionated 0.22 (*)    All other components within normal limits  RETICULOCYTES - Abnormal; Notable for the following:    RBC. 3.25 (*)    All other components within normal limits  HEPATIC FUNCTION PANEL - Abnormal; Notable for the following:    Albumin 2.8 (*)    All other components within normal limits  I-STAT ARTERIAL BLOOD GAS, ED - Abnormal; Notable for the following:    pH, Arterial 7.452 (*)    pCO2 arterial 31.1 (*)    pO2, Arterial 56.0 (*)    All other components within normal limits  MRSA PCR SCREENING  ANTITHROMBIN III  HEPARIN LEVEL (UNFRACTIONATED)  TROPONIN I  HEPARIN LEVEL (UNFRACTIONATED)  PROTEIN C ACTIVITY  PROTEIN C, TOTAL  PROTEIN S ACTIVITY  PROTEIN S, TOTAL  LUPUS ANTICOAGULANT PANEL  BETA-2-GLYCOPROTEIN I ABS, IGG/M/A  HOMOCYSTEINE  FACTOR 5 LEIDEN  PROTHROMBIN GENE MUTATION  CARDIOLIPIN ANTIBODIES, IGG, IGM, IGA  HOMOCYSTEINE  VITAMIN B12  FOLATE  IRON AND TIBC  FERRITIN  HEPARIN LEVEL (UNFRACTIONATED)  TYPE AND SCREEN  ABO/RH  PREPARE RBC (CROSSMATCH)    Imaging Review Dg Chest 2 View  02/06/2015   CLINICAL DATA:  Shortness of Breath  EXAM: CHEST  2 VIEW  COMPARISON:  None.  FINDINGS: Cardiomediastinal silhouette is unremarkable. Significant dextroscoliosis lower thoracic spine. Levoscoliosis upper thoracic spine. Small hiatal hernia. No acute infiltrate or pulmonary edema. There is small focal herniation of right posterior diaphragm best seen on lateral view. Thoracic spine osteopenia.  IMPRESSION: Thoracic scoliosis.  No active disease.  Small hiatal hernia.   Electronically Signed   By: Lahoma Crocker M.D.   On: 02/06/2015 10:30   Ct Angio Chest Pe W/cm &/or Wo Cm  02/06/2015  CLINICAL DATA:  Two day history of chest pain and difficulty breathing  EXAM: CT ANGIOGRAPHY CHEST WITH CONTRAST   TECHNIQUE: Multidetector CT imaging of the chest was performed using the standard protocol during bolus administration of intravenous contrast. Multiplanar CT image reconstructions and MIPs were obtained to evaluate the vascular anatomy.  CONTRAST:  71mL OMNIPAQUE IOHEXOL 350 MG/ML SOLN  COMPARISON:  Chest radiograph February 06, 2015  FINDINGS: There is extensive pulmonary embolism arising from the right main pulmonary artery extending into multiple right lower lobe branches as well as into the anterior segment right upper lobe branch. There are pulmonary emboli in the distal left main pulmonary artery with pulmonary emboli extending into the proximal upper and lower lobe pulmonary arteries. There is right heart strain with the right ventricle to left ventricle diameter ratio measuring 1.5, normal less than 0.9.  There is no thoracic aortic aneurysm or dissection.  There is a moderate hiatal hernia.  There is underlying centrilobular emphysematous change. There is patchy atelectasis in both lung bases, slightly more on the left than on the right.  There is no appreciable thoracic adenopathy. There are multiple foci of coronary artery calcification. The pericardium is not thickened. There is enlargement of the left lobe of the thyroid with a focal calcified nodular area in the left lobe measuring 1.1 x 0.9 cm.  In the visualized upper abdomen, there is hepatic steatosis.  There is degenerative change in the thoracic spine with upper thoracic levoscoliosis. There are no blastic or lytic bone lesions.  Review of the MIP images confirms the above findings.  IMPRESSION: Positive for acute PE with CT evidence of right heart strain (RV/LV Ratio = 1.5) consistent with at least submassive (intermediate risk) PE. The presence of right heart strain has been associated with an increased risk of morbidity and mortality. Please activate Code PE by paging [phone].  Underlying emphysema with patchy bibasilar atelectatic change,  slightly more on the left than on the right.  Enlarged left lobe of thyroid with calcified nodule in left lobe thyroid. Consider further evaluation with thyroid ultrasound. If patient is clinically hyperthyroid, consider nuclear medicine thyroid uptake and scan.  Hepatic steatosis.  No appreciable adenopathy.  Critical Value/emergent results were called by telephone at the time of interpretation on 02/06/2015 at 11:51 am to Dr. Alfonzo Beers , who verbally acknowledged these results.   Electronically Signed   By: Lowella Grip III M.D.   On: 02/06/2015 11:52     EKG Interpretation   Date/Time:  Sunday February 06 2015 10:57:12 EDT Ventricular Rate:  109 PR Interval:  142 QRS Duration: 86 QT Interval:  366 QTC Calculation: 492 R Axis:   72 Text Interpretation:  Sinus tachycardia T wave abnormality, consider  anterior ischemia Abnormal ECG No significant change since last tracing  Confirmed by [facility]  MD, Khristen Cheyney [phone]) on 02/06/2015 1:15:12 PM      MDM   Final diagnoses:  Shortness of breath  submassive PE Tachycardia Hypoxic respiratory failure Elevated troponin  Pt presenting tachypneic and tachycardic and hypoxic with shortness of breath for the past 2 days.  o2 improved with supplemental oxygen, ABG shows po2 56- O2 increased.  Pt with hx of PE- ct angio obtained and shows large clot burden with right heart strain.  Pt began to have chest pain in the ED.  Troponin elevated, most likley due to large PEs.  See notes above- d/w radiology, PCCM, and triad.  Pt started on heparin drip prior to CT  angio due to high risk/clinical picture and elevated troponin.  Pt updated multiple times and is agreeable with transfer to [facility] for further management.     Alfonzo Beers, MD 02/07/15 [phone]

## 2015-02-06 NOTE — Progress Notes (Signed)
ANTICOAGULATION CONSULT NOTE - Initial Consult  Pharmacy Consult for Heparin Indication: pulmonary embolus  No Known Allergies  Patient Measurements: Height: 5' (152.4 cm) Weight: 172 lb 2.9 oz (78.1 kg) IBW/kg (Calculated) : 45.5 Heparin Dosing Weight: 64 kg  Vital Signs: Temp: 98 F (36.7 C) (03/27 1403) Temp Source: Oral (03/27 1403) BP: 136/78 mmHg (03/27 1403) Pulse Rate: 90 (03/27 1403)  Labs:  Recent Labs  02/06/15 1010  HGB 8.8*  HCT 30.3*  PLT 278  CREATININE 1.39*  TROPONINI 0.09*    Estimated Creatinine Clearance: 33.8 mL/min (by C-G formula based on Cr of 1.39).   Medical History: Past Medical History  Diagnosis Date  . Degenerative disorder of bone   . Scoliosis   . Esophageal ulcer   . Allergy   . Hyperlipidemia   . Chronic headaches   . Back pain   . Pulmonary embolism   . Anemia    Assessment:   Transferred to [facility] from [place] for PE with right heart strain. Noted hx PE 40-50 yrs ago.    Heparin begun at [facility] ~11:40am with 4000 units IV x 1 and running at 1000 units/hr (~ 15.6 units/kg/hr).  Goal of Therapy:  Heparin level 0.3-0.7 units/ml Monitor platelets by anticoagulation protocol: Yes   Plan:   Continue heparin drip at 1000 units/hr.  Heparin level ~ 6 hrs after drip begun, ~5:30pm.  Daily heparin level and CBC while on heparin.  Arty Baumgartner, [place] Pager: [phone] 02/06/2015,3:57 PM

## 2015-02-06 NOTE — H&P (Addendum)
[facility] History and Physical  [name] May ZOX:[id number] DOB: 1941/07/06 DOA: 02/06/2015  Referring physician: Dr Alfonzo Beers PCP: Wonda Cerise, MD   Chief Complaint:  Progressive shortness of  breath since 2 days  HPI:  74 year old female with history of scoliosis, GERD, remote history of PE shortly following ankle surgery about 40 years back, history of tobacco use (quit over 20 years ago) who [place] shortness of breath for past 2 days. Patient reports acute onset of shortness of breath worsening with minimal exertion 2 days back with some chest heaviness. Patient at baseline is fairly active but was unable to ambulate even up to 40 feet without getting short of breath. She also reports mild orthopnea but no PND. Denies any recent illness, recently being bedbound or recent travel. Denies family history of blood clots. She denies any hemoptysis.. Patient denies headache, dizziness, fever, chills, nausea , vomiting, palpitations, abdominal pain, bowel or urinary symptoms. Denies change in weight or appetite.  Course in the ED Patient was tachycardic up to 120s and tachypnea to 28. She was saturating in high 80s on room air. Blood work significant for hemoglobin of 8.8 and creatinine of 1.39. Troponin was mildly elevated to 0.09. X-ray was unremarkable. CT angiogram of the chest done showed acute submassive PE with right heart strain (RV/LV ratio of 1.5). Also showed underlying emphysema. ABG done showed mild toxemia. Patient started on IV heparin drip. Patient placed on 5 L O2 via nasal cannula and hospitalists admission requested to stepdown. Critical care was consulted by ED physician who recommended admission to stepdown on heparin drip and no need for thrombolysis at this time.  In the stepdown unit patient is on 5 L via nasal cannula and maintaining O2 sat in high 90s. Heart rate is in the 90s and blood pressure stable. On  reducing the O2 to 3L her sats dropped down to mid 80s. Also on making patient sit up on 5 L oxygen saturation drops down to 88%  Review of Systems:  Constitutional: Denies fever, chills, diaphoresis, appetite change and fatigue.  HEENT: Denies visual or hearing symptoms, congestion, difficult swallowing, neck pain or stiffness Respiratory: SOB, DOE, orthopnea, denies cough, chest tightness,  and wheezing.   Cardiovascular: chest heaviness+, denies palpitations and leg swelling.  Gastrointestinal: Denies nausea, vomiting, abdominal pain, diarrhea, constipation, blood in stool and abdominal distention.  Genitourinary: Denies dysuria,hematuria, flank pain and difficulty urinating.  Endocrine: Denies: hot or cold intolerance, , polyuria, polydipsia. Musculoskeletal: Denies myalgias, back pain, joint pain. Denies cough swelling or tenderness Skin: Denies pallor, rash and wound.  Neurological: Denies dizziness, seizures, syncope, weakness, light-headedness, numbness and headaches.  Hematological: Denies adenopathy Psychiatric/Behavioral: Denies confusion   Past Medical History  Diagnosis Date  . Degenerative disorder of bone   . Scoliosis   . Esophageal ulcer   . Allergy   . Hyperlipidemia   . Chronic headaches   . Back pain   . Pulmonary embolism   . Anemia    Past Surgical History  Procedure Laterality Date  . Ankle surgery    . Laminectomy    . Nasal sinus surgery    . Abdominal hysterectomy     Social History:  reports that she has quit smoking. She has never used smokeless tobacco. She reports that she does not drink alcohol or use illicit drugs.  No Known Allergies  No family history on file.  Prior to Admission medications   Medication Sig  Start Date End Date Taking? Authorizing Provider  buPROPion (WELLBUTRIN XL) 300 MG 24 hr tablet Take 300 mg by mouth daily.  02/25/14  Yes Historical Provider, MD  Calcium Carbonate-Vitamin D (CALCIUM + D PO) Take 1 tablet by mouth  daily.    Yes Historical Provider, MD  clorazepate (TRANXENE) 3.75 MG tablet Take 3.75 mg by mouth 2 (two) times daily. 02/02/15  Yes Historical Provider, MD  furosemide (LASIX) 20 MG tablet Take 20 mg by mouth daily as needed for fluid or edema.    Yes Historical Provider, MD  gabapentin (NEURONTIN) 600 MG tablet Take 600 mg by mouth 3 (three) times daily. 15:00, 18:00, and 21:00 02/25/14  Yes Historical Provider, MD  hydrocortisone 2.5 % cream Apply 1 application topically daily as needed (eczema).  01/06/14  Yes Historical Provider, MD  ibuprofen (ADVIL,MOTRIN) 600 MG tablet Take 600 mg by mouth every 6 (six) hours as needed for moderate pain.    Yes Historical Provider, MD  iron polysaccharides (NU-IRON) 150 MG capsule Take 150 mg by mouth daily.   Yes Historical Provider, MD  lamoTRIgine (LAMICTAL) 200 MG tablet Take 200 mg by mouth at bedtime.  02/20/14  Yes Historical Provider, MD  loperamide (IMODIUM A-D) 2 MG tablet Take 4-6 mg by mouth daily as needed for diarrhea or loose stools.   Yes Historical Provider, MD  loratadine (CLARITIN) 10 MG tablet Take 10 mg by mouth daily.   Yes Historical Provider, MD  pantoprazole (PROTONIX) 40 MG tablet Take 40 mg by mouth 2 (two) times daily as needed (acid reflux).  01/24/14  Yes Historical Provider, MD  rOPINIRole (REQUIP) 1 MG tablet Take 1 mg by mouth every morning.  12/15/14  Yes Historical Provider, MD  rOPINIRole (REQUIP) 2 MG tablet Take 2 mg by mouth 2 (two) times daily.  02/25/14  Yes Historical Provider, MD  zolpidem (AMBIEN) 5 MG tablet Take 5 mg by mouth at bedtime as needed for sleep.  01/31/15  Yes Historical Provider, MD  oxyCODONE-acetaminophen (PERCOCET) 10-325 MG per tablet Take 1 tablet by mouth every 6 (six) hours as needed for pain. Patient not taking: Reported on 02/06/2015 04/29/14   Max T Hyatt, DPM  promethazine (PHENERGAN) 12.5 MG tablet Take 2 tablets (25 mg total) by mouth every 6 (six) hours as needed for nausea or vomiting. Patient  not taking: Reported on 02/06/2015 04/29/14   Max T Hyatt, DPM  zolpidem (AMBIEN) 10 MG tablet  02/22/14   Historical Provider, MD     Physical Exam:  Filed Vitals:   02/06/15 1400 02/06/15 1403 02/06/15 1403 02/06/15 1500  BP: 136/78 136/78    Pulse: 89 90    Temp:  98 F (36.7 C) 98 F (36.7 C)   TempSrc:  Axillary Oral   Resp: 19 18    Height:    5' (1.524 m)  Weight:    78.1 kg (172 lb 2.9 oz)  SpO2: 95% 95%      Constitutional: Vital signs reviewed.  Elderly female lying in bed in no acute distress. HEENT: no pallor, no icterus, moist oral mucosa, no cervical lymphadenopathy Cardiovascular: RRR, S1 normal, S2 normal, no MRG Chest: CTAB, no wheezes, rales, or rhonchi GI: Soft. Non-tender, non-distended, bowel sounds are normal, musculoskeletal: warm, no edema, no calf tenderness or swelling  Neurological: Alert and oriented  Labs on Admission:  Basic Metabolic Panel:  Recent Labs Lab 02/06/15 1010  NA 136  K 3.8  CL 103  CO2 23  GLUCOSE 116*  BUN 22  CREATININE 1.39*  CALCIUM 8.8   Liver Function Tests: No results for input(s): AST, ALT, ALKPHOS, BILITOT, PROT, ALBUMIN in the last 168 hours. No results for input(s): LIPASE, AMYLASE in the last 168 hours. No results for input(s): AMMONIA in the last 168 hours. CBC:  Recent Labs Lab 02/06/15 1010  WBC 9.6  HGB 8.8*  HCT 30.3*  MCV 81.2  PLT 278   Cardiac Enzymes:  Recent Labs Lab 02/06/15 1010  TROPONINI 0.09*   BNP: Invalid input(s): POCBNP CBG: No results for input(s): GLUCAP in the last 168 hours.  Radiological Exams on Admission: Dg Chest 2 View  02/06/2015   CLINICAL DATA:  Shortness of Breath  EXAM: CHEST  2 VIEW  COMPARISON:  None.  FINDINGS: Cardiomediastinal silhouette is unremarkable. Significant dextroscoliosis lower thoracic spine. Levoscoliosis upper thoracic spine. Small hiatal hernia. No acute infiltrate or pulmonary edema. There is small focal herniation of right posterior  diaphragm best seen on lateral view. Thoracic spine osteopenia.  IMPRESSION: Thoracic scoliosis.  No active disease.  Small hiatal hernia.   Electronically Signed   By: Lahoma Crocker M.D.   On: 02/06/2015 10:30   Ct Angio Chest Pe W/cm &/or Wo Cm  02/06/2015   CLINICAL DATA:  Two day history of chest pain and difficulty breathing  EXAM: CT ANGIOGRAPHY CHEST WITH CONTRAST  TECHNIQUE: Multidetector CT imaging of the chest was performed using the standard protocol during bolus administration of intravenous contrast. Multiplanar CT image reconstructions and MIPs were obtained to evaluate the vascular anatomy.  CONTRAST:  38mL OMNIPAQUE IOHEXOL 350 MG/ML SOLN  COMPARISON:  Chest radiograph February 06, 2015  FINDINGS: There is extensive pulmonary embolism arising from the right main pulmonary artery extending into multiple right lower lobe branches as well as into the anterior segment right upper lobe branch. There are pulmonary emboli in the distal left main pulmonary artery with pulmonary emboli extending into the proximal upper and lower lobe pulmonary arteries. There is right heart strain with the right ventricle to left ventricle diameter ratio measuring 1.5, normal less than 0.9.  There is no thoracic aortic aneurysm or dissection.  There is a moderate hiatal hernia.  There is underlying centrilobular emphysematous change. There is patchy atelectasis in both lung bases, slightly more on the left than on the right.  There is no appreciable thoracic adenopathy. There are multiple foci of coronary artery calcification. The pericardium is not thickened. There is enlargement of the left lobe of the thyroid with a focal calcified nodular area in the left lobe measuring 1.1 x 0.9 cm.  In the visualized upper abdomen, there is hepatic steatosis.  There is degenerative change in the thoracic spine with upper thoracic levoscoliosis. There are no blastic or lytic bone lesions.  Review of the MIP images confirms the above  findings.  IMPRESSION: Positive for acute PE with CT evidence of right heart strain (RV/LV Ratio = 1.5) consistent with at least submassive (intermediate risk) PE. The presence of right heart strain has been associated with an increased risk of morbidity and mortality. Please activate Code PE by paging [phone].  Underlying emphysema with patchy bibasilar atelectatic change, slightly more on the left than on the right.  Enlarged left lobe of thyroid with calcified nodule in left lobe thyroid. Consider further evaluation with thyroid ultrasound. If patient is clinically hyperthyroid, consider nuclear medicine thyroid uptake and scan.  Hepatic steatosis.  No appreciable adenopathy.  Critical Value/emergent results were called by  telephone at the time of interpretation on 02/06/2015 at 11:51 am to Dr. Alfonzo Beers , who verbally acknowledged these results.   Electronically Signed   By: Lowella Grip III M.D.   On: 02/06/2015 11:52    EKG: Sinus Tachycardia at 109, T-wave inversion in V3-V5, no old EKG to compare  Assessment/Plan  Principal Problem:   Acute hypoxic respiratory failure secondary to submassive PE Admit to stepdown. Continue IV heparin drip. Continue O2 via nasal cannula (requiring up to 5 L at present). History of remote PE post surgery remotely. Check hypercoagulable panel and Doppler of lower extremity. Check 2-D echo to evaluate right heart strain. Critical care consulted to evaluate for thrombolysis. Spoke with Dr Nelda Marseille who will see the pt.    Active Problems:  Elevated troponin Likely secondary to acute PE. No active chest pain. EKG unremarkable. Will cycle enzymes.    Scoliosis of thoracic spine Pain control with when necessary Vicodin. Patient reports being evaluated for surgery    Chronic back pain Will hold NSAIDs and place her on when necessary Vicodin  Acute kidney injury Mild. Will hold NSAIDs. Gentle IV hydration.  GERD Continue PPI twice a  day  Anemia No baseline hemoglobin available. . Check stool for occult blood   Diet: Regular  DVT prophylaxis: IV heparin   Code Status: Full code Family Communication: discussed with patient and her daughter at bedside Disposition Plan: Admit to stepdown  Louellen Molder [facility] Pager [phone]  Total time spent on admission :70 minutes  If 7PM-7AM, please contact night-coverage [email or web address] Password [facility] 02/06/2015, 4:22 PM

## 2015-02-06 NOTE — Consult Note (Signed)
Name: [name] May MRN: [id number] DOB: Jun 23, 1941    ADMISSION DATE:  02/06/2015 CONSULTATION DATE:  02/06/2015  REFERRING MD :  Dr. Clementeen Graham  CHIEF COMPLAINT:  PE  BRIEF PATIENT DESCRIPTION: 74 year old female with PMH of a PE in her 3s treated with a course of coumadin.  She has been becoming more tired lately and has been leading more of a sedentary life style.  No recent trips or plan/car rides.  No family history of clots.  She does endorse however that her right leg has been more swollen than her left and that has been for a couple of years (unsure how many).  Has been on lasix for that.  She reports sudden SOB starting 2 days ago but has been getting worse.  She presented to [facility] ED.  A CT was done that showed bilateral PE R>L.  She [facility].  SIGNIFICANT EVENTS  3/27 Admission for acute PE  STUDIES:  3/27 CTA with bilateral large PE  PAST MEDICAL HISTORY :   has a past medical history of Degenerative disorder of bone; Scoliosis; Esophageal ulcer; Allergy; Hyperlipidemia; Chronic headaches; Back pain; Pulmonary embolism; Anemia; Depression; and Bipolar disorder.  has past surgical history that includes Ankle surgery; Laminectomy; Nasal sinus surgery; and Abdominal hysterectomy. Prior to Admission medications   Medication Sig Start Date End Date Taking? Authorizing Provider  buPROPion (WELLBUTRIN XL) 300 MG 24 hr tablet Take 300 mg by mouth daily.  02/25/14  Yes Historical Provider, MD  Calcium Carbonate-Vitamin D (CALCIUM + D PO) Take 1 tablet by mouth daily.    Yes Historical Provider, MD  clorazepate (TRANXENE) 3.75 MG tablet Take 3.75 mg by mouth 2 (two) times daily. 02/02/15  Yes Historical Provider, MD  furosemide (LASIX) 20 MG tablet Take 20 mg by mouth daily as needed for fluid or edema.    Yes Historical Provider, MD  gabapentin (NEURONTIN) 600 MG tablet Take 600 mg by mouth 3 (three) times daily.  15:00, 18:00, and 21:00 02/25/14  Yes Historical Provider, MD  hydrocortisone 2.5 % cream Apply 1 application topically daily as needed (eczema).  01/06/14  Yes Historical Provider, MD  ibuprofen (ADVIL,MOTRIN) 600 MG tablet Take 600 mg by mouth every 6 (six) hours as needed for moderate pain.    Yes Historical Provider, MD  iron polysaccharides (NU-IRON) 150 MG capsule Take 150 mg by mouth daily.   Yes Historical Provider, MD  lamoTRIgine (LAMICTAL) 200 MG tablet Take 200 mg by mouth at bedtime.  02/20/14  Yes Historical Provider, MD  loperamide (IMODIUM A-D) 2 MG tablet Take 4-6 mg by mouth daily as needed for diarrhea or loose stools.   Yes Historical Provider, MD  loratadine (CLARITIN) 10 MG tablet Take 10 mg by mouth daily.   Yes Historical Provider, MD  pantoprazole (PROTONIX) 40 MG tablet Take 40 mg by mouth 2 (two) times daily as needed (acid reflux).  01/24/14  Yes Historical Provider, MD  rOPINIRole (REQUIP) 1 MG tablet Take 1 mg by mouth every morning.  12/15/14  Yes Historical Provider, MD  rOPINIRole (REQUIP) 2 MG tablet Take 2 mg by mouth 2 (two) times daily.  02/25/14  Yes Historical Provider, MD  zolpidem (AMBIEN) 5 MG tablet Take 5 mg by mouth at bedtime as needed for sleep.  01/31/15  Yes Historical Provider, MD  oxyCODONE-acetaminophen (PERCOCET) 10-325 MG per tablet Take 1 tablet by mouth every 6 (six) hours as  needed for pain. Patient not taking: Reported on 02/06/2015 04/29/14   Max T Hyatt, DPM  promethazine (PHENERGAN) 12.5 MG tablet Take 2 tablets (25 mg total) by mouth every 6 (six) hours as needed for nausea or vomiting. Patient not taking: Reported on 02/06/2015 04/29/14   Max T Hyatt, DPM  zolpidem (AMBIEN) 10 MG tablet  02/22/14   Historical Provider, MD   No Known Allergies  FAMILY HISTORY:  family history is not on file. SOCIAL HISTORY:  reports that she has quit smoking. She has never used smokeless tobacco. She reports that she does not drink alcohol or use illicit  drugs.  REVIEW OF SYSTEMS:   Constitutional: Negative for fever, chills, weight loss, malaise/fatigue and diaphoresis.  HENT: Negative for hearing loss, ear pain, nosebleeds, congestion, sore throat, neck pain, tinnitus and ear discharge.   Eyes: Negative for blurred vision, double vision, photophobia, pain, discharge and redness.  Respiratory: Negative for cough, hemoptysis, sputum production, shortness of breath, wheezing and stridor.   Cardiovascular: Negative for chest pain, palpitations, orthopnea, claudication, leg swelling and PND.  Gastrointestinal: Negative for heartburn, nausea, vomiting, abdominal pain, diarrhea, constipation, blood in stool and melena.  Genitourinary: Negative for dysuria, urgency, frequency, hematuria and flank pain.  Musculoskeletal: Negative for myalgias, back pain, joint pain and falls.  Skin: Negative for itching and rash.  Neurological: Negative for dizziness, tingling, tremors, sensory change, speech change, focal weakness, seizures, loss of consciousness, weakness and headaches.  Endo/Heme/Allergies: Negative for environmental allergies and polydipsia. Does not bruise/bleed easily.  SUBJECTIVE:   VITAL SIGNS: Temp:  [97.8 F (36.6 C)-98.8 F (37.1 C)] 97.8 F (36.6 C) (03/27 1600) Pulse Rate:  [66-122] 86 (03/27 1600) Resp:  [14-28] 14 (03/27 1600) BP: (108-136)/(60-108) 134/86 mmHg (03/27 1600) SpO2:  [89 %-100 %] 90 % (03/27 1600) Weight:  [78.1 kg (172 lb 2.9 oz)] 78.1 kg (172 lb 2.9 oz) (03/27 1500)  PHYSICAL EXAMINATION: General:  Well appearing scoliotic woman, resting comfortably in exam bed in NAD. Neuro:  Alert and oriented, moving all ext to command. Head: [place]/AT ENT:  WNL EYES:  PERRL, EOM-I. Cardiovascular:  RRR, Nl S1/S2, -M/R/G. Lungs:  CTA bilaterally. Abdomen:  Soft, NT, ND and +BS. Musculoskeletal:  R leg with 2+ edema and left with 1+ edema. Skin:  Intact   Recent Labs Lab 02/06/15 1010  NA 136  K 3.8  CL 103  CO2 23   BUN 22  CREATININE 1.39*  GLUCOSE 116*    Recent Labs Lab 02/06/15 1010  HGB 8.8*  HCT 30.3*  WBC 9.6  PLT 278   Dg Chest 2 View  02/06/2015   CLINICAL DATA:  Shortness of Breath  EXAM: CHEST  2 VIEW  COMPARISON:  None.  FINDINGS: Cardiomediastinal silhouette is unremarkable. Significant dextroscoliosis lower thoracic spine. Levoscoliosis upper thoracic spine. Small hiatal hernia. No acute infiltrate or pulmonary edema. There is small focal herniation of right posterior diaphragm best seen on lateral view. Thoracic spine osteopenia.  IMPRESSION: Thoracic scoliosis.  No active disease.  Small hiatal hernia.   Electronically Signed   By: Lahoma Crocker M.D.   On: 02/06/2015 10:30   Ct Angio Chest Pe W/cm &/or Wo Cm  02/06/2015   CLINICAL DATA:  Two day history of chest pain and difficulty breathing  EXAM: CT ANGIOGRAPHY CHEST WITH CONTRAST  TECHNIQUE: Multidetector CT imaging of the chest was performed using the standard protocol during bolus administration of intravenous contrast. Multiplanar CT image reconstructions and MIPs were obtained to  evaluate the vascular anatomy.  CONTRAST:  13mL OMNIPAQUE IOHEXOL 350 MG/ML SOLN  COMPARISON:  Chest radiograph February 06, 2015  FINDINGS: There is extensive pulmonary embolism arising from the right main pulmonary artery extending into multiple right lower lobe branches as well as into the anterior segment right upper lobe branch. There are pulmonary emboli in the distal left main pulmonary artery with pulmonary emboli extending into the proximal upper and lower lobe pulmonary arteries. There is right heart strain with the right ventricle to left ventricle diameter ratio measuring 1.5, normal less than 0.9.  There is no thoracic aortic aneurysm or dissection.  There is a moderate hiatal hernia.  There is underlying centrilobular emphysematous change. There is patchy atelectasis in both lung bases, slightly more on the left than on the right.  There is no  appreciable thoracic adenopathy. There are multiple foci of coronary artery calcification. The pericardium is not thickened. There is enlargement of the left lobe of the thyroid with a focal calcified nodular area in the left lobe measuring 1.1 x 0.9 cm.  In the visualized upper abdomen, there is hepatic steatosis.  There is degenerative change in the thoracic spine with upper thoracic levoscoliosis. There are no blastic or lytic bone lesions.  Review of the MIP images confirms the above findings.  IMPRESSION: Positive for acute PE with CT evidence of right heart strain (RV/LV Ratio = 1.5) consistent with at least submassive (intermediate risk) PE. The presence of right heart strain has been associated with an increased risk of morbidity and mortality. Please activate Code PE by paging [phone].  Underlying emphysema with patchy bibasilar atelectatic change, slightly more on the left than on the right.  Enlarged left lobe of thyroid with calcified nodule in left lobe thyroid. Consider further evaluation with thyroid ultrasound. If patient is clinically hyperthyroid, consider nuclear medicine thyroid uptake and scan.  Hepatic steatosis.  No appreciable adenopathy.  Critical Value/emergent results were called by telephone at the time of interpretation on 02/06/2015 at 11:51 am to Dr. Alfonzo Beers , who verbally acknowledged these results.   Electronically Signed   By: Lowella Grip III M.D.   On: 02/06/2015 11:52    ASSESSMENT / PLAN:  Acute and severe PE. Hypoxemic respiratory failure. Right heart strain. Right leg swelling. History of tobacco abuse.  I have reviewed the CT myself and shows R>L clot burden PE. I have discussed the case with Dr. Clementeen Graham and agreed on plan.  Recommendations: - 2D echo to delineate right heart strain and pulmonary artery pressure. - Bilateral lower extremity dopplers, if there is a clot there mobile or not will need to place an IVC filter as the clot burden is  very large.  Ensure it is a retrievable filter. - Heparin drip for anti-coagulation. - Patient was given the option of lytics and has declined. - Discussed coumadin vs the newer generation anti-coag at length and given that she was on coumadin before and grew comfortable with it she chose coumadin. - Recommend starting coumdin in AM with a 48 hour bridge. - Supplemental O2 as needed. - Hold ambulation for today. - IS per RT protocol. - When able to ambulate then will need an ambulatory desaturation study to qualify for home O2 as I suspect she will need home O2 for sometime until the clot organizes. - Coumadin for life. - Hypercoagulable panel - Keep in SDU as patient is at high risk for deterioration. - PCCM will follow.  Rush Farmer, M.D. [facility]  Pulmonary/Critical Care Medicine. Pager: [phone]. After hours pager: [phone].  02/06/2015, 5:28 PM

## 2015-02-06 NOTE — ED Notes (Signed)
PO2 56 on ABG @ 3l/m, O2 increased to 6l/m humidified.  SpO2 93%

## 2015-02-06 NOTE — ED Notes (Signed)
Patient transported to CT 

## 2015-02-06 NOTE — ED Notes (Signed)
Pt reports she felt SOB this morning when she "got up and moved around"- audible wheezing- denies history of heart or respiratory problems- has ekg last fall which was "normal"- increased SOB with exertion

## 2015-02-07 ENCOUNTER — Encounter (HOSPITAL_COMMUNITY): Payer: Self-pay | Admitting: Radiology

## 2015-02-07 ENCOUNTER — Inpatient Hospital Stay (HOSPITAL_COMMUNITY): Payer: Medicare Other

## 2015-02-07 DIAGNOSIS — R0602 Shortness of breath: Secondary | ICD-10-CM

## 2015-02-07 DIAGNOSIS — I82409 Acute embolism and thrombosis of unspecified deep veins of unspecified lower extremity: Secondary | ICD-10-CM | POA: Insufficient documentation

## 2015-02-07 DIAGNOSIS — I82403 Acute embolism and thrombosis of unspecified deep veins of lower extremity, bilateral: Secondary | ICD-10-CM

## 2015-02-07 DIAGNOSIS — I2699 Other pulmonary embolism without acute cor pulmonale: Secondary | ICD-10-CM

## 2015-02-07 DIAGNOSIS — D649 Anemia, unspecified: Secondary | ICD-10-CM | POA: Diagnosis present

## 2015-02-07 DIAGNOSIS — R0902 Hypoxemia: Secondary | ICD-10-CM

## 2015-02-07 LAB — IRON AND TIBC
Iron: 24 ug/dL — ABNORMAL LOW (ref 42–145)
SATURATION RATIOS: 7 % — AB (ref 20–55)
TIBC: 335 ug/dL (ref 250–470)
UIBC: 311 ug/dL (ref 125–400)

## 2015-02-07 LAB — BASIC METABOLIC PANEL
Anion gap: 7 (ref 5–15)
BUN: 16 mg/dL (ref 6–23)
CHLORIDE: 104 mmol/L (ref 96–112)
CO2: 25 mmol/L (ref 19–32)
Calcium: 8.2 mg/dL — ABNORMAL LOW (ref 8.4–10.5)
Creatinine, Ser: 1.02 mg/dL (ref 0.50–1.10)
GFR calc Af Amer: 62 mL/min — ABNORMAL LOW (ref >90)
GFR calc non Af Amer: 54 mL/min — ABNORMAL LOW (ref >90)
GLUCOSE: 111 mg/dL — AB (ref 70–99)
Potassium: 3.8 mmol/L (ref 3.5–5.1)
SODIUM: 136 mmol/L (ref 135–145)

## 2015-02-07 LAB — VITAMIN B12: Vitamin B-12: 475 pg/mL (ref 211–911)

## 2015-02-07 LAB — CBC
HEMATOCRIT: 26.7 % — AB (ref 36.0–46.0)
HEMOGLOBIN: 7.7 g/dL — AB (ref 12.0–15.0)
MCH: 23.2 pg — AB (ref 26.0–34.0)
MCHC: 28.8 g/dL — AB (ref 30.0–36.0)
MCV: 80.4 fL (ref 78.0–100.0)
Platelets: 258 10*3/uL (ref 150–400)
RBC: 3.32 MIL/uL — ABNORMAL LOW (ref 3.87–5.11)
RDW: 16.5 % — AB (ref 11.5–15.5)
WBC: 8.3 10*3/uL (ref 4.0–10.5)

## 2015-02-07 LAB — HEPARIN LEVEL (UNFRACTIONATED)
Heparin Unfractionated: 0.22 IU/mL — ABNORMAL LOW (ref 0.30–0.70)
Heparin Unfractionated: 0.58 IU/mL (ref 0.30–0.70)
Heparin Unfractionated: 0.43 IU/mL (ref 0.30–0.70)

## 2015-02-07 LAB — HEPATIC FUNCTION PANEL
ALT: 12 U/L (ref 0–35)
AST: 19 U/L (ref 0–37)
Albumin: 2.8 g/dL — ABNORMAL LOW (ref 3.5–5.2)
Alkaline Phosphatase: 70 U/L (ref 39–117)
BILIRUBIN TOTAL: 0.7 mg/dL (ref 0.3–1.2)
Bilirubin, Direct: 0.2 mg/dL (ref 0.0–0.5)
Indirect Bilirubin: 0.5 mg/dL (ref 0.3–0.9)
Total Protein: 6.9 g/dL (ref 6.0–8.3)

## 2015-02-07 LAB — TROPONIN I
TROPONIN I: 0.05 ng/mL — AB (ref <0.031)
TROPONIN I: 0.03 ng/mL (ref <0.031)

## 2015-02-07 LAB — PREPARE RBC (CROSSMATCH)

## 2015-02-07 LAB — RETICULOCYTES
RBC.: 3.25 MIL/uL — AB (ref 3.87–5.11)
RETIC COUNT ABSOLUTE: 94.3 10*3/uL (ref 19.0–186.0)
Retic Ct Pct: 2.9 % (ref 0.4–3.1)

## 2015-02-07 LAB — FOLATE: Folate: 9.3 ng/mL

## 2015-02-07 LAB — FERRITIN: Ferritin: 15 ng/mL (ref 10–291)

## 2015-02-07 LAB — ABO/RH: ABO/RH(D): A POS

## 2015-02-07 MED ORDER — HEPARIN (PORCINE) IN NACL 100-0.45 UNIT/ML-% IJ SOLN
1400.0000 [IU]/h | INTRAMUSCULAR | Status: DC
  Administered 2015-02-07 (×2): 1250 [IU]/h via INTRAVENOUS
  Administered 2015-02-09 (×2): 1400 [IU]/h via INTRAVENOUS
  Administered 2015-02-11: 1650 [IU]/h via INTRAVENOUS
  Administered 2015-02-12: 1550 [IU]/h via INTRAVENOUS
  Administered 2015-02-13 – 2015-02-14 (×3): 1400 [IU]/h via INTRAVENOUS
  Filled 2015-02-07 (×17): qty 250

## 2015-02-07 MED ORDER — HEPARIN BOLUS VIA INFUSION
1000.0000 [IU] | INTRAVENOUS | Status: AC
  Administered 2015-02-07: 1000 [IU] via INTRAVENOUS
  Filled 2015-02-07: qty 1000

## 2015-02-07 MED ORDER — SODIUM CHLORIDE 0.9 % IV SOLN
Freq: Once | INTRAVENOUS | Status: AC
  Administered 2015-02-07: 14:00:00 via INTRAVENOUS

## 2015-02-07 MED ORDER — LEVALBUTEROL HCL 0.63 MG/3ML IN NEBU
0.6300 mg | INHALATION_SOLUTION | Freq: Once | RESPIRATORY_TRACT | Status: AC
  Administered 2015-02-07: 0.63 mg via RESPIRATORY_TRACT

## 2015-02-07 MED ORDER — LEVALBUTEROL HCL 0.63 MG/3ML IN NEBU
0.6300 mg | INHALATION_SOLUTION | Freq: Four times a day (QID) | RESPIRATORY_TRACT | Status: DC | PRN
  Administered 2015-02-08: 0.63 mg via RESPIRATORY_TRACT
  Filled 2015-02-07: qty 3

## 2015-02-07 MED ORDER — FUROSEMIDE 10 MG/ML IJ SOLN
20.0000 mg | Freq: Once | INTRAMUSCULAR | Status: AC
  Administered 2015-02-07: 20 mg via INTRAVENOUS
  Filled 2015-02-07: qty 2

## 2015-02-07 MED ORDER — CLORAZEPATE DIPOTASSIUM 3.75 MG PO TABS
3.7500 mg | ORAL_TABLET | Freq: Two times a day (BID) | ORAL | Status: DC
  Administered 2015-02-07 – 2015-02-15 (×16): 3.75 mg via ORAL
  Filled 2015-02-07 (×17): qty 1

## 2015-02-07 NOTE — Progress Notes (Signed)
[facility] PROGRESS NOTE  [name] May GPQ:[id number] DOB: 04-01-1941 DOA: 02/06/2015  PCP: Wonda Cerise, MD  Brief HPI: 74 year old Caucasian female with a past medical history of scoliosis, GERD, remote history of PE, presented to the hospital with shortness of breath. She was found to have submassive pulmonary embolism with evidence for right heart strain. Patient was admitted to the hospital for further management.  Past medical history:  Past Medical History  Diagnosis Date  . Degenerative disorder of bone   . Scoliosis   . Esophageal ulcer   . Allergy   . Hyperlipidemia   . Chronic headaches   . Back pain   . Pulmonary embolism   . Anemia   . Depression   . Bipolar disorder     Consultants: Pulmonology  Procedures:  Lower extremity venous Doppler Preliminary findings: Positive for DVT involving the Right Common femoral, femoral, profunda femoral, popliteal, posterior tibial, and gastroc veins. Positive for DVT involving the Left Common femoral, femoral, profunda femoral, popliteal, soleal, and gastroc veins.  2-D echocardiogram is pending  IVC filter placement is pending  Antibiotics: None.   Subjective: The patient continues to feel short of breath this morning. Denies any dizziness or lightheadedness. Denies any chest pain. No nausea, vomiting.  Objective: Vital Signs  Filed Vitals:   02/06/15 2000 02/06/15 2325 02/07/15 0302 02/07/15 0732  BP: 97/64 102/59 111/61 111/73  Pulse: 85 93 105 97  Temp: 98.3 F (36.8 C) 98.4 F (36.9 C) 99.2 F (37.3 C) 98.2 F (36.8 C)  TempSrc: Oral Oral Oral Oral  Resp: 21 22 30 21   Height:      Weight:      SpO2: 97% 100% 93% 90%    Intake/Output Summary (Last 24 hours) at 02/07/15 0802 Last data filed at 02/07/15 0600  Gross per 24 hour  Intake 1321.38 ml  Output    300 ml  Net 1021.38 ml   Filed Weights   02/06/15 1500  Weight: 78.1 kg (172 lb 2.9 oz)    General appearance: alert,  cooperative, appears stated age and no distress Resp: Diminished air entry at the bases with coarse breath sounds. Diffuse wheezing bilaterally. Few crackles heard as well. Cardio: regular rate and rhythm, S1, S2 normal, no murmur, click, rub or gallop GI: soft, non-tender; bowel sounds normal; no masses,  no organomegaly Extremities: extremities normal, atraumatic, no cyanosis or edema Skin: Skin color, texture, turgor normal. No rashes or lesions Neurologic: No focal deficits.  Lab Results:  Basic Metabolic Panel:  Recent Labs Lab 02/06/15 1010 02/07/15 0333  NA 136 136  K 3.8 3.8  CL 103 104  CO2 23 25  GLUCOSE 116* 111*  BUN 22 16  CREATININE 1.39* 1.02  CALCIUM 8.8 8.2*   CBC:  Recent Labs Lab 02/06/15 1010 02/07/15 0333  WBC 9.6 8.3  HGB 8.8* 7.7*  HCT 30.3* 26.7*  MCV 81.2 80.4  PLT 278 258   Cardiac Enzymes:  Recent Labs Lab 02/06/15 1010 02/06/15 1620 02/06/15 2220 02/07/15 0333  TROPONINI 0.09* 0.07* 0.05* 0.03   BNP (last 3 results)  Recent Labs  02/06/15 1010  BNP 693.7*    Recent Results (from the past 240 hour(s))  MRSA PCR Screening     Status: None   Collection Time: 02/06/15  3:08 PM  Result Value Ref Range Status   MRSA by PCR NEGATIVE NEGATIVE Final    Comment:        The GeneXpert MRSA Assay (FDA  approved for NASAL specimens only), is one component of a comprehensive MRSA colonization surveillance program. It is not intended to diagnose MRSA infection nor to guide or monitor treatment for MRSA infections.       Studies/Results: Dg Chest 2 View  02/06/2015   CLINICAL DATA:  Shortness of Breath  EXAM: CHEST  2 VIEW  COMPARISON:  None.  FINDINGS: Cardiomediastinal silhouette is unremarkable. Significant dextroscoliosis lower thoracic spine. Levoscoliosis upper thoracic spine. Small hiatal hernia. No acute infiltrate or pulmonary edema. There is small focal herniation of right posterior diaphragm best seen on lateral view.  Thoracic spine osteopenia.  IMPRESSION: Thoracic scoliosis.  No active disease.  Small hiatal hernia.   Electronically Signed   By: Lahoma Crocker M.D.   On: 02/06/2015 10:30   Ct Angio Chest Pe W/cm &/or Wo Cm  02/06/2015   CLINICAL DATA:  Two day history of chest pain and difficulty breathing  EXAM: CT ANGIOGRAPHY CHEST WITH CONTRAST  TECHNIQUE: Multidetector CT imaging of the chest was performed using the standard protocol during bolus administration of intravenous contrast. Multiplanar CT image reconstructions and MIPs were obtained to evaluate the vascular anatomy.  CONTRAST:  36mL OMNIPAQUE IOHEXOL 350 MG/ML SOLN  COMPARISON:  Chest radiograph February 06, 2015  FINDINGS: There is extensive pulmonary embolism arising from the right main pulmonary artery extending into multiple right lower lobe branches as well as into the anterior segment right upper lobe branch. There are pulmonary emboli in the distal left main pulmonary artery with pulmonary emboli extending into the proximal upper and lower lobe pulmonary arteries. There is right heart strain with the right ventricle to left ventricle diameter ratio measuring 1.5, normal less than 0.9.  There is no thoracic aortic aneurysm or dissection.  There is a moderate hiatal hernia.  There is underlying centrilobular emphysematous change. There is patchy atelectasis in both lung bases, slightly more on the left than on the right.  There is no appreciable thoracic adenopathy. There are multiple foci of coronary artery calcification. The pericardium is not thickened. There is enlargement of the left lobe of the thyroid with a focal calcified nodular area in the left lobe measuring 1.1 x 0.9 cm.  In the visualized upper abdomen, there is hepatic steatosis.  There is degenerative change in the thoracic spine with upper thoracic levoscoliosis. There are no blastic or lytic bone lesions.  Review of the MIP images confirms the above findings.  IMPRESSION: Positive for acute PE  with CT evidence of right heart strain (RV/LV Ratio = 1.5) consistent with at least submassive (intermediate risk) PE. The presence of right heart strain has been associated with an increased risk of morbidity and mortality. Please activate Code PE by paging [phone].  Underlying emphysema with patchy bibasilar atelectatic change, slightly more on the left than on the right.  Enlarged left lobe of thyroid with calcified nodule in left lobe thyroid. Consider further evaluation with thyroid ultrasound. If patient is clinically hyperthyroid, consider nuclear medicine thyroid uptake and scan.  Hepatic steatosis.  No appreciable adenopathy.  Critical Value/emergent results were called by telephone at the time of interpretation on 02/06/2015 at 11:51 am to Dr. Alfonzo Beers , who verbally acknowledged these results.   Electronically Signed   By: Lowella Grip III M.D.   On: 02/06/2015 11:52    Medications:  Scheduled: . sodium chloride   Intravenous Once  . antiseptic oral rinse  7 mL Mouth Rinse BID  . buPROPion  300 mg Oral Daily  .  calcium-vitamin D  1 tablet Oral Q breakfast  . furosemide  20 mg Intravenous Once  . gabapentin  600 mg Oral TID  . iron polysaccharides  150 mg Oral Daily  . lamoTRIgine  200 mg Oral QHS  . loratadine  10 mg Oral Daily  . rOPINIRole  1 mg Oral Daily  . rOPINIRole  2 mg Oral 2 times per day  . sodium chloride  3 mL Intravenous Q12H  . zolpidem  5 mg Oral QHS   Continuous: . sodium chloride 10 mL/hr at 02/07/15 0834  . heparin 1,250 Units/hr (02/07/15 0355)   WIO:[id number] **OR** acetaminophen, HYDROcodone-acetaminophen, [COMPLETED] levalbuterol **FOLLOWED BY** levalbuterol, loperamide, nitroGLYCERIN, ondansetron **OR** ondansetron (ZOFRAN) IV, pantoprazole, zolpidem  Assessment/Plan:  Principal Problem:   Acute pulmonary embolism Active Problems:   Scoliosis of thoracic spine   GERD (gastroesophageal reflux disease)   Acute respiratory failure with  hypoxia   Acute kidney injury   Elevated troponin    Acute hypoxic respiratory failure secondary to submassive PE Patient's condition remains tenuous. She however remains hemodynamically stable. Continues to require oxygen, which will be continued. Continue IV heparin infusion. Pulmonology is following. Plan is for IVC filter placement today. Lower extremity Dopplers did reveal bilateral DVT. Hypercoagulable panel is pending. Irrespective of this she will require lifelong anticoagulation. Coumadin to be started tomorrow. Cardiac exam is pending. She does have significant wheezing today. We will give her nebulizer treatments.  Elevated troponin Likely secondary to acute PE. No active chest pain. EKG unremarkable. Follow-up results of echocardiogram.  Scoliosis of thoracic spine Pain control with when necessary Vicodin. Patient reports being evaluated for surgery  Chronic back pain Continue to hold NSAIDs. Continue when necessary Vicodin.  Acute kidney injury Mild. Appears to have resolved. Cut back on IV fluids.   GERD Continue PPI twice a day  Normocytic Anemia Patient gives a history of iron deficiency anemia. She is followed by hematologist, Dr. Cruzita Lederer at [facility]. She has received iron infusions. Never has received blood transfusions. Hemoglobin has dropped significantly today. No overt bleeding. This could also be contributing to her dyspnea. We will transfuse 2 units. Lasix in between. I tried to contact her hematologist, but he is out of town. On-call hematologist hasn't returned my call yet. Check stool for occult blood.   DVT Prophylaxis: On IV heparin    Code Status: Full code  Family Communication: Discussed with the patient. No family at bedside.  Disposition Plan: Remain in step down.    LOS: 1 day   [facility] Pager [phone] 02/07/2015, 8:02 AM  If 7PM-7AM, please contact night-coverage at [email or web address], password [facility]

## 2015-02-07 NOTE — Progress Notes (Addendum)
ANTICOAGULATION CONSULT NOTE - Follow Up Consult  Pharmacy Consult for heparin Indication: pulmonary embolus  No Known Allergies  Patient Measurements: Height: 5' (152.4 cm) Weight: 172 lb 2.9 oz (78.1 kg) IBW/kg (Calculated) : 45.5 Heparin Dosing Weight: 64 kg  Vital Signs: Temp: 98.2 F (36.8 C) (03/28 1117) Temp Source: Oral (03/28 1117) BP: 90/57 mmHg (03/28 1117) Pulse Rate: 91 (03/28 1117)  Labs:  Recent Labs  02/06/15 1010 02/06/15 1620 02/06/15 2220 02/07/15 0219 02/07/15 0333 02/07/15 1112  HGB 8.8*  --   --   --  7.7*  --   HCT 30.3*  --   --   --  26.7*  --   PLT 278  --   --   --  258  --   HEPARINUNFRC  --  0.36  --  0.22*  --  0.58  CREATININE 1.39*  --   --   --  1.02  --   TROPONINI 0.09* 0.07* 0.05*  --  0.03  --     Estimated Creatinine Clearance: 46 mL/min (by C-G formula based on Cr of 1.02).   Medications:  Scheduled:  . sodium chloride   Intravenous Once  . antiseptic oral rinse  7 mL Mouth Rinse BID  . buPROPion  300 mg Oral Daily  . calcium-vitamin D  1 tablet Oral Q breakfast  . furosemide  20 mg Intravenous Once  . gabapentin  600 mg Oral TID  . iron polysaccharides  150 mg Oral Daily  . lamoTRIgine  200 mg Oral QHS  . loratadine  10 mg Oral Daily  . rOPINIRole  1 mg Oral Daily  . rOPINIRole  2 mg Oral 2 times per day  . sodium chloride  3 mL Intravenous Q12H  . zolpidem  5 mg Oral QHS   Infusions:  . sodium chloride 10 mL/hr at 02/07/15 0834  . heparin 1,250 Units/hr (02/07/15 0355)    Assessment: 74 yo female with bilateral PE/DVT on heparin and at goal (HL= 0.58). Hg/hct= 7.7/26.7 and down from 8.8/30.3 on 3/27. Plans noted for IVC filter then coumadin (per patient preference).  Goal of Therapy:  Heparin level 0.3-0.7 units/ml Monitor platelets by anticoagulation protocol: Yes   Plan:  -No heparin changes needed -Will recheck a heparin level later today to confirm -PT/INR in am to obtain a baseline INR -Daily  heparin level and CBC  Hildred Laser, Pharm D 02/07/2015 12:10 PM

## 2015-02-07 NOTE — Progress Notes (Signed)
ANTICOAGULATION CONSULT NOTE - Follow Up Consult  Pharmacy Consult for heparin Indication: pulmonary embolus  No Known Allergies  Patient Measurements: Height: 5' (152.4 cm) Weight: 172 lb 2.9 oz (78.1 kg) IBW/kg (Calculated) : 45.5 Heparin Dosing Weight: 64 kg  Vital Signs: Temp: 99.2 F (37.3 C) (03/28 0302) Temp Source: Oral (03/28 0302) BP: 111/61 mmHg (03/28 0302) Pulse Rate: 105 (03/28 0302)  Labs:  Recent Labs  02/06/15 1010 02/06/15 1620 02/06/15 2220 02/07/15 0219  HGB 8.8*  --   --   --   HCT 30.3*  --   --   --   PLT 278  --   --   --   HEPARINUNFRC  --  0.36  --  0.22*  CREATININE 1.39*  --   --   --   TROPONINI 0.09* 0.07* 0.05*  --     Estimated Creatinine Clearance: 33.8 mL/min (by C-G formula based on Cr of 1.39).   Medications:  Scheduled:  . antiseptic oral rinse  7 mL Mouth Rinse BID  . buPROPion  300 mg Oral Daily  . calcium-vitamin D  1 tablet Oral Q breakfast  . gabapentin  600 mg Oral TID  . iron polysaccharides  150 mg Oral Daily  . lamoTRIgine  200 mg Oral QHS  . loratadine  10 mg Oral Daily  . rOPINIRole  1 mg Oral Daily  . rOPINIRole  2 mg Oral 2 times per day  . sodium chloride  3 mL Intravenous Q12H  . zolpidem  5 mg Oral QHS   Infusions:  . sodium chloride 75 mL (02/06/15 2025)  . heparin 1,100 Units/hr (02/06/15 2025)    Assessment: 74 yo female with PE is currently on therapeutic heparin Heparin level decreased to 0.22 despite an increase in the rate last evening. RN reports that heparin is infusing without problems and the IV site looks good. No bleeding noted.   Goal of Therapy:  Heparin level 0.3-0.7 units/ml Monitor platelets by anticoagulation protocol: Yes   Plan:  Bolus heparin 1000 units x 1 Increase heparin to 1250 units/hr - 8 hrs heparin level   Nicole Cella, RPh Clinical Pharmacist Pager: [phone] 02/07/2015,3:12 AM

## 2015-02-07 NOTE — Progress Notes (Signed)
  Echocardiogram 2D Echocardiogram has been performed.  [name] May 02/07/2015, 4:03 PM

## 2015-02-07 NOTE — Consult Note (Signed)
Risks and Benefits discussed with the patient including, but not limited to bleeding, infection, contrast induced renal failure, filter fracture or migration which can lead to emergency surgery or even death, strut penetration with damage or irritation to adjacent structures and caval thrombosis. All of the patient's questions were answered, patient is agreeable to proceed. Consent signed and in chart.    Chief Complaint: Chief Complaint  Patient presents with  . Shortness of Breath  B PE with large clot burden and Rt heart strain B DVT  Referring Physician(s): PCCM  History of Present Illness: [name] May is a 74 y.o. female   Pt developed sudden shortness of breath 2 days prior to admission Felt heavy chest pressure in mid chest Seemed similar to previous PE---she had had PE when she was in her 40's post ankle surgery Treated successfully with coumadin CTA did reveal B PE with Rt heart strain (large clot burden) Doppler 3/28 does reveal B DVT On Heparin drip now---to transition to coumadin Pt was evaluated by PCCM-- Pt declined thrombolysis per note Now request is made for retrievable Inferior Vena Cava Filter placement I have seen and examined pt  Pt has eaten breakfast and lunch today Plan for 3/29 am  Past Medical History  Diagnosis Date  . Degenerative disorder of bone   . Scoliosis   . Esophageal ulcer   . Allergy   . Hyperlipidemia   . Chronic headaches   . Back pain   . Pulmonary embolism   . Anemia   . Depression   . Bipolar disorder     Past Surgical History  Procedure Laterality Date  . Ankle surgery    . Laminectomy    . Nasal sinus surgery    . Abdominal hysterectomy      Allergies: Review of patient's allergies indicates no known allergies.  Medications: Prior to Admission medications   Medication Sig Start Date End Date Taking? Authorizing Provider  buPROPion (WELLBUTRIN XL) 300 MG 24 hr tablet Take 300 mg by mouth daily.  02/25/14   Yes Historical Provider, MD  Calcium Carbonate-Vitamin D (CALCIUM + D PO) Take 1 tablet by mouth daily.    Yes Historical Provider, MD  clorazepate (TRANXENE) 3.75 MG tablet Take 3.75 mg by mouth 2 (two) times daily. 02/02/15  Yes Historical Provider, MD  furosemide (LASIX) 20 MG tablet Take 20 mg by mouth daily as needed for fluid or edema.    Yes Historical Provider, MD  gabapentin (NEURONTIN) 600 MG tablet Take 600 mg by mouth 3 (three) times daily. 15:00, 18:00, and 21:00 02/25/14  Yes Historical Provider, MD  hydrocortisone 2.5 % cream Apply 1 application topically daily as needed (eczema).  01/06/14  Yes Historical Provider, MD  ibuprofen (ADVIL,MOTRIN) 600 MG tablet Take 600 mg by mouth every 6 (six) hours as needed for moderate pain.    Yes Historical Provider, MD  iron polysaccharides (NU-IRON) 150 MG capsule Take 150 mg by mouth daily.   Yes Historical Provider, MD  lamoTRIgine (LAMICTAL) 200 MG tablet Take 200 mg by mouth at bedtime.  02/20/14  Yes Historical Provider, MD  loperamide (IMODIUM A-D) 2 MG tablet Take 4-6 mg by mouth daily as needed for diarrhea or loose stools.   Yes Historical Provider, MD  loratadine (CLARITIN) 10 MG tablet Take 10 mg by mouth daily.   Yes Historical Provider, MD  pantoprazole (PROTONIX) 40 MG tablet Take 40 mg by mouth 2 (two) times daily as needed (acid reflux).  01/24/14  Yes  Historical Provider, MD  rOPINIRole (REQUIP) 1 MG tablet Take 1 mg by mouth every morning.  12/15/14  Yes Historical Provider, MD  rOPINIRole (REQUIP) 2 MG tablet Take 2 mg by mouth 2 (two) times daily.  02/25/14  Yes Historical Provider, MD  zolpidem (AMBIEN) 5 MG tablet Take 5 mg by mouth at bedtime as needed for sleep.  01/31/15  Yes Historical Provider, MD  oxyCODONE-acetaminophen (PERCOCET) 10-325 MG per tablet Take 1 tablet by mouth every 6 (six) hours as needed for pain. Patient not taking: Reported on 02/06/2015 04/29/14   Max T Hyatt, DPM  promethazine (PHENERGAN) 12.5 MG tablet Take  2 tablets (25 mg total) by mouth every 6 (six) hours as needed for nausea or vomiting. Patient not taking: Reported on 02/06/2015 04/29/14   Max T Hyatt, DPM  zolpidem (AMBIEN) 10 MG tablet  02/22/14   Historical Provider, MD     History reviewed. No pertinent family history.  History   Social History  . Marital Status: Widowed    Spouse Name: N/A  . Number of Children: N/A  . Years of Education: N/A   Social History Main Topics  . Smoking status: Former Research scientist (life sciences)  . Smokeless tobacco: Never Used     Comment: quit 22 years ago   . Alcohol Use: No  . Drug Use: No  . Sexual Activity: Not Currently   Other Topics Concern  . None   Social History Narrative     Review of Systems: A 12 point ROS discussed and pertinent positives are indicated in the HPI above.  All other systems are negative.  Review of Systems  Constitutional: Positive for activity change and fatigue. Negative for fever and appetite change.  Respiratory: Positive for chest tightness and shortness of breath. Negative for cough.   Cardiovascular: Negative for chest pain.  Gastrointestinal: Negative for abdominal pain.  Musculoskeletal: Negative for back pain.  Psychiatric/Behavioral: Negative for behavioral problems and confusion.    Vital Signs: BP 106/57 mmHg  Pulse 94  Temp(Src) 98.3 F (36.8 C) (Oral)  Resp 18  Ht 5' (1.524 m)  Wt 78.1 kg (172 lb 2.9 oz)  BMI 33.63 kg/m2  SpO2 93%  Physical Exam  Constitutional: She is oriented to person, place, and time. She appears well-nourished.  Cardiovascular: Normal rate, regular rhythm and normal heart sounds.   No murmur heard. Pulmonary/Chest: Breath sounds normal. She has no wheezes.  Abdominal: Soft. Bowel sounds are normal. She exhibits no distension. There is no tenderness.  Musculoskeletal: Normal range of motion.  Neurological: She is alert and oriented to person, place, and time.  Skin: Skin is warm and dry.  Psychiatric: She has a normal mood and  affect. Her behavior is normal. Judgment and thought content normal.  Nursing note and vitals reviewed.   Mallampati Score:  MD Evaluation Airway: WNL Heart: WNL Abdomen: WNL Chest/ Lungs: WNL ASA  Classification: 3 Mallampati/Airway Score: One  Imaging: Dg Chest 2 View  02/06/2015   CLINICAL DATA:  Shortness of Breath  EXAM: CHEST  2 VIEW  COMPARISON:  None.  FINDINGS: Cardiomediastinal silhouette is unremarkable. Significant dextroscoliosis lower thoracic spine. Levoscoliosis upper thoracic spine. Small hiatal hernia. No acute infiltrate or pulmonary edema. There is small focal herniation of right posterior diaphragm best seen on lateral view. Thoracic spine osteopenia.  IMPRESSION: Thoracic scoliosis.  No active disease.  Small hiatal hernia.   Electronically Signed   By: Lahoma Crocker M.D.   On: 02/06/2015 10:30   Ct  Angio Chest Pe W/cm &/or Wo Cm  02/06/2015   CLINICAL DATA:  Two day history of chest pain and difficulty breathing  EXAM: CT ANGIOGRAPHY CHEST WITH CONTRAST  TECHNIQUE: Multidetector CT imaging of the chest was performed using the standard protocol during bolus administration of intravenous contrast. Multiplanar CT image reconstructions and MIPs were obtained to evaluate the vascular anatomy.  CONTRAST:  91mL OMNIPAQUE IOHEXOL 350 MG/ML SOLN  COMPARISON:  Chest radiograph February 06, 2015  FINDINGS: There is extensive pulmonary embolism arising from the right main pulmonary artery extending into multiple right lower lobe branches as well as into the anterior segment right upper lobe branch. There are pulmonary emboli in the distal left main pulmonary artery with pulmonary emboli extending into the proximal upper and lower lobe pulmonary arteries. There is right heart strain with the right ventricle to left ventricle diameter ratio measuring 1.5, normal less than 0.9.  There is no thoracic aortic aneurysm or dissection.  There is a moderate hiatal hernia.  There is underlying  centrilobular emphysematous change. There is patchy atelectasis in both lung bases, slightly more on the left than on the right.  There is no appreciable thoracic adenopathy. There are multiple foci of coronary artery calcification. The pericardium is not thickened. There is enlargement of the left lobe of the thyroid with a focal calcified nodular area in the left lobe measuring 1.1 x 0.9 cm.  In the visualized upper abdomen, there is hepatic steatosis.  There is degenerative change in the thoracic spine with upper thoracic levoscoliosis. There are no blastic or lytic bone lesions.  Review of the MIP images confirms the above findings.  IMPRESSION: Positive for acute PE with CT evidence of right heart strain (RV/LV Ratio = 1.5) consistent with at least submassive (intermediate risk) PE. The presence of right heart strain has been associated with an increased risk of morbidity and mortality. Please activate Code PE by paging [phone].  Underlying emphysema with patchy bibasilar atelectatic change, slightly more on the left than on the right.  Enlarged left lobe of thyroid with calcified nodule in left lobe thyroid. Consider further evaluation with thyroid ultrasound. If patient is clinically hyperthyroid, consider nuclear medicine thyroid uptake and scan.  Hepatic steatosis.  No appreciable adenopathy.  Critical Value/emergent results were called by telephone at the time of interpretation on 02/06/2015 at 11:51 am to Dr. Alfonzo Beers , who verbally acknowledged these results.   Electronically Signed   By: Lowella Grip III M.D.   On: 02/06/2015 11:52    Labs:  CBC:  Recent Labs  02/06/15 1010 02/07/15 0333  WBC 9.6 8.3  HGB 8.8* 7.7*  HCT 30.3* 26.7*  PLT 278 258    COAGS: No results for input(s): INR, APTT in the last 8760 hours.  BMP:  Recent Labs  02/06/15 1010 02/07/15 0333  NA 136 136  K 3.8 3.8  CL 103 104  CO2 23 25  GLUCOSE 116* 111*  BUN 22 16  CALCIUM 8.8 8.2*    CREATININE 1.39* 1.02  GFRNONAA 37* 54*  GFRAA 43* 62*    LIVER FUNCTION TESTS:  Recent Labs  02/07/15 1112  BILITOT 0.7  AST 19  ALT 12  ALKPHOS 70  PROT 6.9  ALBUMIN 2.8*    TUMOR MARKERS: No results for input(s): AFPTM, CEA, CA199, CHROMGRNA in the last 8760 hours.  Assessment and Plan:  B PE with Rt heart strain per CTA Declines lytic therapy per PCCM note + B DVT per  doppler Heparin IV then to coumadin Now scheduled for retrievable IVC filter placement Risks and Benefits discussed with the patient including, but not limited to bleeding, infection, contrast induced renal failure, filter fracture or migration which can lead to emergency surgery or even death, strut penetration with damage or irritation to adjacent structures and caval thrombosis. All of the patient's questions were answered, patient is agreeable to proceed. Consent signed and in chart.   Thank you for this interesting consult.  I greatly enjoyed meeting [name] May and look forward to participating in their care.  Signed: Jorrell Kuster A 02/07/2015, 2:45 PM   I spent a total of 20 Minutes  in face to face in clinical consultation, greater than 50% of which was counseling/coordinating care for retrievable IVC filter placement

## 2015-02-07 NOTE — Progress Notes (Addendum)
*  PRELIMINARY RESULTS* Vascular Ultrasound Lower extremity venous duplex\ has been completed.  Preliminary findings: Positive for DVT involving the Right Common femoral, femoral, profunda femoral, popliteal, posterior tibial, and gastroc veins. Positive for DVT involving the Left Common femoral, femoral, profunda femoral, popliteal, soleal, and gastroc veins.  Landry Mellow, RDMS, RVT  02/07/2015, 8:33 AM

## 2015-02-07 NOTE — Progress Notes (Signed)
Two units of blood transfused today per order, with 1 dose of IV 20 mg lasix given in between each unit. Pt voided 1200cc after lasix given (see doc flow sheets). Patient tolerated blood transfusions very well. No reaction suspected. No SOB, no pain, or dyspnea at this time.   Roxan Hockey, RN

## 2015-02-07 NOTE — Care Management Note (Addendum)
    Page 1 of 1   02/09/2015     9:19:16 AM CARE MANAGEMENT NOTE 02/09/2015  Patient:  [name] May,[name] May   Account Number:  [id number]  Date Initiated:  02/07/2015  Documentation initiated by:  Elissa Hefty  Subjective/Objective Assessment:   adm w pul embolus     Action/Plan:   lives alone, pcp dr Jefm Petty   Anticipated DC Date:     Anticipated DC Plan:  HOME/SELF CARE         Choice offered to / List presented to:             Status of service:   Medicare Important Message given?  YES (If response is "NO", the following Medicare IM given date fields will be blank) Date Medicare IM given:  02/09/2015 Medicare IM given by:  Elissa Hefty Date Additional Medicare IM given:   Additional Medicare IM given by:    Discharge Disposition:    Per UR Regulation:  Reviewed for med. necessity/level of care/duration of stay  If discussed at [place] of Stay Meetings, dates discussed:    Comments:

## 2015-02-07 NOTE — Progress Notes (Signed)
ANTICOAGULATION CONSULT NOTE - Follow Up Consult  Pharmacy Consult for heparin Indication: pulmonary embolus  No Known Allergies  Patient Measurements: Height: 5' (152.4 cm) Weight: 172 lb 2.9 oz (78.1 kg) IBW/kg (Calculated) : 45.5 Heparin Dosing Weight: 64 kg  Vital Signs: Temp: 97.6 F (36.4 C) (03/28 1916) Temp Source: Oral (03/28 1645) BP: 92/60 mmHg (03/28 1916) Pulse Rate: 89 (03/28 1916)  Labs:  Recent Labs  02/06/15 1010  02/06/15 1620 02/06/15 2220 02/07/15 0219 02/07/15 0333 02/07/15 1112 02/07/15 2036  HGB 8.8*  --   --   --   --  7.7*  --   --   HCT 30.3*  --   --   --   --  26.7*  --   --   PLT 278  --   --   --   --  258  --   --   HEPARINUNFRC  --   < > 0.36  --  0.22*  --  0.58 0.43  CREATININE 1.39*  --   --   --   --  1.02  --   --   TROPONINI 0.09*  --  0.07* 0.05*  --  0.03  --   --   < > = values in this interval not displayed.  Estimated Creatinine Clearance: 46 mL/min (by C-G formula based on Cr of 1.02).   Medications:  Scheduled:  . antiseptic oral rinse  7 mL Mouth Rinse BID  . buPROPion  300 mg Oral Daily  . calcium-vitamin D  1 tablet Oral Q breakfast  . clorazepate  3.75 mg Oral BID  . gabapentin  600 mg Oral TID  . iron polysaccharides  150 mg Oral Daily  . lamoTRIgine  200 mg Oral QHS  . loratadine  10 mg Oral Daily  . rOPINIRole  1 mg Oral Daily  . rOPINIRole  2 mg Oral 2 times per day  . sodium chloride  3 mL Intravenous Q12H  . zolpidem  5 mg Oral QHS   Infusions:  . sodium chloride Stopped (02/07/15 1939)  . heparin 1,250 Units/hr (02/07/15 0355)    Assessment: 74 yo female with bilateral PE/DVT on heparin and at goal (HL= 0.43). Hgb/hct= 7.7/26.7 and down from 8.8/30.3 on 3/27. s/p PRBC x 2 3/28. Plans noted for IVC filter then coumadin (per patient preference).  Goal of Therapy:  Heparin level 0.3-0.7 units/ml Monitor platelets by anticoagulation protocol: Yes   Plan:  Continue heparin at 1250 units/hr Daily  heparin level and CBC  Sherlon Handing, PharmD, BCPS Clinical pharmacist, pager [phone]  02/07/2015 9:20 PM

## 2015-02-07 NOTE — Consult Note (Signed)
Name: [name] May MRN: [id number] DOB: 1941-03-14    ADMISSION DATE:  02/06/2015 CONSULTATION DATE:  02/06/2015  REFERRING MD :  Dr. Clementeen Graham  CHIEF COMPLAINT:  PE  BRIEF PATIENT DESCRIPTION: 74 year old female with PMH of a PE in her 69s treated with a course of coumadin.  She has been becoming more tired lately and has been leading more of a sedentary life style.  No recent trips or plan/car rides.  No family history of clots.  She does endorse however that her right leg has been more swollen than her left and that has been for a couple of years (unsure how many).  Has been on lasix for that.  She reports sudden SOB starting 2 days ago but has been getting worse.  She presented to [facility] ED.  A CT was done that showed bilateral PE R>L.  She was transferred to [facility] under [facility] on SDU for treatment.  SIGNIFICANT EVENTS  3/27 Admission for acute PE  STUDIES:  3/27 CTA with bilateral large PE  SUBJECTIVE: More lethargic this AM due to vicoden but easily arousable.  VITAL SIGNS: Temp:  [97.8 F (36.6 C)-99.2 F (37.3 C)] 98.2 F (36.8 C) (03/28 0732) Pulse Rate:  [85-107] 97 (03/28 0732) Resp:  [14-30] 21 (03/28 0732) BP: (97-136)/(59-108) 111/73 mmHg (03/28 0732) SpO2:  [90 %-100 %] 93 % (03/28 0833) FiO2 (%):  [40 %] 40 % (03/28 0833) Weight:  [78.1 kg (172 lb 2.9 oz)] 78.1 kg (172 lb 2.9 oz) (03/27 1500)  PHYSICAL EXAMINATION: General:  Well appearing scoliotic woman, resting comfortably in exam bed in NAD. Neuro:  Alert and oriented, moving all ext to command. Head: [place]/AT ENT:  WNL EYES:  PERRL, EOM-I. Cardiovascular:  RRR, Nl S1/S2, -M/R/G. Lungs:  CTA bilaterally. Abdomen:  Soft, NT, ND and +BS. Musculoskeletal:  R leg with 2+ edema and left with 1+ edema. Skin:  Intact  Recent Labs Lab 02/06/15 1010 02/07/15 0333  NA 136 136  K 3.8 3.8  CL 103 104  CO2 23 25  BUN 22 16  CREATININE 1.39* 1.02  GLUCOSE 116* 111*   Recent Labs Lab  02/06/15 1010 02/07/15 0333  HGB 8.8* 7.7*  HCT 30.3* 26.7*  WBC 9.6 8.3  PLT 278 258   Dg Chest 2 View  02/06/2015   CLINICAL DATA:  Shortness of Breath  EXAM: CHEST  2 VIEW  COMPARISON:  None.  FINDINGS: Cardiomediastinal silhouette is unremarkable. Significant dextroscoliosis lower thoracic spine. Levoscoliosis upper thoracic spine. Small hiatal hernia. No acute infiltrate or pulmonary edema. There is small focal herniation of right posterior diaphragm best seen on lateral view. Thoracic spine osteopenia.  IMPRESSION: Thoracic scoliosis.  No active disease.  Small hiatal hernia.   Electronically Signed   By: Lahoma Crocker M.D.   On: 02/06/2015 10:30   Ct Angio Chest Pe W/cm &/or Wo Cm  02/06/2015   CLINICAL DATA:  Two day history of chest pain and difficulty breathing  EXAM: CT ANGIOGRAPHY CHEST WITH CONTRAST  TECHNIQUE: Multidetector CT imaging of the chest was performed using the standard protocol during bolus administration of intravenous contrast. Multiplanar CT image reconstructions and MIPs were obtained to evaluate the vascular anatomy.  CONTRAST:  4mL OMNIPAQUE IOHEXOL 350 MG/ML SOLN  COMPARISON:  Chest radiograph February 06, 2015  FINDINGS: There is extensive pulmonary embolism arising from the right main pulmonary artery extending into multiple right lower lobe branches as well as into the anterior segment  right upper lobe branch. There are pulmonary emboli in the distal left main pulmonary artery with pulmonary emboli extending into the proximal upper and lower lobe pulmonary arteries. There is right heart strain with the right ventricle to left ventricle diameter ratio measuring 1.5, normal less than 0.9.  There is no thoracic aortic aneurysm or dissection.  There is a moderate hiatal hernia.  There is underlying centrilobular emphysematous change. There is patchy atelectasis in both lung bases, slightly more on the left than on the right.  There is no appreciable thoracic adenopathy. There  are multiple foci of coronary artery calcification. The pericardium is not thickened. There is enlargement of the left lobe of the thyroid with a focal calcified nodular area in the left lobe measuring 1.1 x 0.9 cm.  In the visualized upper abdomen, there is hepatic steatosis.  There is degenerative change in the thoracic spine with upper thoracic levoscoliosis. There are no blastic or lytic bone lesions.  Review of the MIP images confirms the above findings.  IMPRESSION: Positive for acute PE with CT evidence of right heart strain (RV/LV Ratio = 1.5) consistent with at least submassive (intermediate risk) PE. The presence of right heart strain has been associated with an increased risk of morbidity and mortality. Please activate Code PE by paging [phone].  Underlying emphysema with patchy bibasilar atelectatic change, slightly more on the left than on the right.  Enlarged left lobe of thyroid with calcified nodule in left lobe thyroid. Consider further evaluation with thyroid ultrasound. If patient is clinically hyperthyroid, consider nuclear medicine thyroid uptake and scan.  Hepatic steatosis.  No appreciable adenopathy.  Critical Value/emergent results were called by telephone at the time of interpretation on 02/06/2015 at 11:51 am to Dr. Alfonzo Beers , who verbally acknowledged these results.   Electronically Signed   By: Lowella Grip III M.D.   On: 02/06/2015 11:52    ASSESSMENT / PLAN:  Acute and severe PE. Hypoxemic respiratory failure. Right heart strain. Right leg swelling. History of tobacco abuse. New and extensive DVT bilaterally.  I have reviewed the CT myself and shows R>L clot burden PE. I have discussed the case with Dr. Clementeen Graham and agreed on plan.  Recommendations: - 2D echo pending. - Bilateral lower extremity dopplers prelim report with extensive DVT bilaterally up to femoral level. - Heparin drip for anti-coagulation. - Start coumadin in AM. - Discussed coumadin vs  the newer generation anti-coag at length and given that she was on coumadin before and grew comfortable with it she chose coumadin. - Supplemental O2 as needed. - Hold ambulation until IVC filter is in. - Place IVC filter. - IS per RT protocol. - When able to ambulate then will need an ambulatory desaturation study to qualify for home O2 as I suspect she will need home O2 for sometime until the clot organizes. - Coumadin for life. - Hypercoagulable panel not finalized. - Keep in SDU as patient is at high risk for deterioration. - PCCM will follow.  Rush Farmer, M.D. [facility] Pulmonary/Critical Care Medicine. Pager: [phone]. After hours pager: [phone].  02/07/2015, 10:59 AM

## 2015-02-08 ENCOUNTER — Inpatient Hospital Stay (HOSPITAL_COMMUNITY): Payer: Medicare Other

## 2015-02-08 DIAGNOSIS — J9601 Acute respiratory failure with hypoxia: Secondary | ICD-10-CM

## 2015-02-08 DIAGNOSIS — I2699 Other pulmonary embolism without acute cor pulmonale: Secondary | ICD-10-CM | POA: Insufficient documentation

## 2015-02-08 DIAGNOSIS — K219 Gastro-esophageal reflux disease without esophagitis: Secondary | ICD-10-CM

## 2015-02-08 DIAGNOSIS — R0602 Shortness of breath: Secondary | ICD-10-CM | POA: Insufficient documentation

## 2015-02-08 LAB — TYPE AND SCREEN
ABO/RH(D): A POS
ANTIBODY SCREEN: NEGATIVE
UNIT DIVISION: 0
Unit division: 0

## 2015-02-08 LAB — CBC
HEMATOCRIT: 31.7 % — AB (ref 36.0–46.0)
Hemoglobin: 9.4 g/dL — ABNORMAL LOW (ref 12.0–15.0)
MCH: 24.7 pg — ABNORMAL LOW (ref 26.0–34.0)
MCHC: 29.7 g/dL — ABNORMAL LOW (ref 30.0–36.0)
MCV: 83.2 fL (ref 78.0–100.0)
PLATELETS: 228 10*3/uL (ref 150–400)
RBC: 3.81 MIL/uL — AB (ref 3.87–5.11)
RDW: 16.3 % — ABNORMAL HIGH (ref 11.5–15.5)
WBC: 7.4 10*3/uL (ref 4.0–10.5)

## 2015-02-08 LAB — COMPREHENSIVE METABOLIC PANEL
ALT: 14 U/L (ref 0–35)
ANION GAP: 6 (ref 5–15)
AST: 28 U/L (ref 0–37)
Albumin: 2.7 g/dL — ABNORMAL LOW (ref 3.5–5.2)
Alkaline Phosphatase: 74 U/L (ref 39–117)
BUN: 18 mg/dL (ref 6–23)
CO2: 25 mmol/L (ref 19–32)
Calcium: 8.3 mg/dL — ABNORMAL LOW (ref 8.4–10.5)
Chloride: 103 mmol/L (ref 96–112)
Creatinine, Ser: 1.04 mg/dL (ref 0.50–1.10)
GFR calc Af Amer: 61 mL/min — ABNORMAL LOW (ref >90)
GFR, EST NON AFRICAN AMERICAN: 52 mL/min — AB (ref >90)
Glucose, Bld: 104 mg/dL — ABNORMAL HIGH (ref 70–99)
POTASSIUM: 3.8 mmol/L (ref 3.5–5.1)
Sodium: 134 mmol/L — ABNORMAL LOW (ref 135–145)
Total Bilirubin: 0.9 mg/dL (ref 0.3–1.2)
Total Protein: 6.7 g/dL (ref 6.0–8.3)

## 2015-02-08 LAB — BETA-2-GLYCOPROTEIN I ABS, IGG/M/A
Beta-2 Glyco I IgG: <9 GPI IgG units (ref 0–20)
Beta-2-Glycoprotein I IgA: <9 GPI IgA units (ref 0–25)

## 2015-02-08 LAB — PROTIME-INR
INR: 1.24 (ref 0.00–1.49)
PROTHROMBIN TIME: 15.7 s — AB (ref 11.6–15.2)

## 2015-02-08 LAB — HOMOCYSTEINE
HOMOCYSTEINE-NORM: 27.2 umol/L — AB (ref 0.0–15.0)
Homocysteine: 25.9 umol/L — ABNORMAL HIGH (ref 0.0–15.0)

## 2015-02-08 LAB — HEPARIN LEVEL (UNFRACTIONATED)
Heparin Unfractionated: 0.24 IU/mL — ABNORMAL LOW (ref 0.30–0.70)
Heparin Unfractionated: 0.43 IU/mL (ref 0.30–0.70)

## 2015-02-08 MED ORDER — GABAPENTIN 300 MG PO CAPS
600.0000 mg | ORAL_CAPSULE | Freq: Two times a day (BID) | ORAL | Status: DC
  Administered 2015-02-08 – 2015-02-15 (×14): 600 mg via ORAL
  Filled 2015-02-08 (×17): qty 2

## 2015-02-08 MED ORDER — FENTANYL CITRATE 0.05 MG/ML IJ SOLN
INTRAMUSCULAR | Status: AC | PRN
  Administered 2015-02-08: 25 ug via INTRAVENOUS

## 2015-02-08 MED ORDER — LIDOCAINE HCL 1 % IJ SOLN
INTRAMUSCULAR | Status: AC
  Filled 2015-02-08: qty 20

## 2015-02-08 MED ORDER — MORPHINE SULFATE 2 MG/ML IJ SOLN
2.0000 mg | INTRAMUSCULAR | Status: DC | PRN
  Administered 2015-02-08: 2 mg via INTRAVENOUS
  Filled 2015-02-08: qty 1

## 2015-02-08 MED ORDER — FENTANYL CITRATE 0.05 MG/ML IJ SOLN
INTRAMUSCULAR | Status: AC
  Filled 2015-02-08: qty 2

## 2015-02-08 MED ORDER — IOHEXOL 300 MG/ML  SOLN
100.0000 mL | Freq: Once | INTRAMUSCULAR | Status: AC | PRN
  Administered 2015-02-08: 50 mL via INTRAVENOUS

## 2015-02-08 MED ORDER — MIDAZOLAM HCL 2 MG/2ML IJ SOLN
INTRAMUSCULAR | Status: AC
  Filled 2015-02-08: qty 2

## 2015-02-08 MED ORDER — FUROSEMIDE 10 MG/ML IJ SOLN
40.0000 mg | Freq: Once | INTRAMUSCULAR | Status: AC
  Administered 2015-02-08: 40 mg via INTRAVENOUS
  Filled 2015-02-08: qty 4

## 2015-02-08 MED ORDER — MIDAZOLAM HCL 2 MG/2ML IJ SOLN
INTRAMUSCULAR | Status: AC | PRN
  Administered 2015-02-08: 0.5 mg via INTRAVENOUS

## 2015-02-08 NOTE — Progress Notes (Signed)
[facility] PROGRESS NOTE  [name] May IOX:[id number] DOB: 1941/02/07 DOA: 02/06/2015  PCP: Wonda Cerise, MD  Brief HPI: 74 year old Caucasian female with a past medical history of scoliosis, GERD, remote history of PE, presented to the hospital with shortness of breath. She was found to have submassive pulmonary embolism with evidence for right heart strain. Patient was admitted to the hospital for further management.  Past medical history:  Past Medical History  Diagnosis Date  . Degenerative disorder of bone   . Scoliosis   . Esophageal ulcer   . Allergy   . Hyperlipidemia   . Chronic headaches   . Back pain   . Pulmonary embolism   . Anemia   . Depression   . Bipolar disorder     Consultants: Pulmonology  Procedures:  Lower extremity venous Doppler Preliminary findings: Positive for DVT involving the Right Common femoral, femoral, profunda femoral, popliteal, posterior tibial, and gastroc veins. Positive for DVT involving the Left Common femoral, femoral, profunda femoral, popliteal, soleal, and gastroc veins.  2-D echocardiogram Study Conclusions - Left ventricle: The cavity size was normal. There was mild focalbasal hypertrophy of the septum. Systolic function was normal.The estimated ejection fraction was in the range of 55% to 60%.Wall motion was normal; there were no regional wall motionabnormalities. Doppler parameters are consistent with abnormalleft ventricular relaxation (grade 1 diastolic dysfunction). - Ventricular septum: The contour showed diastolic flattening andsystolic flattening. - Mitral valve: Calcified annulus. - Right ventricle: Systolic function was moderately reduced. - Atrial septum: The septum bowed from right to left, consistentwith increased right atrial pressure. - Pulmonary arteries: Systolic pressure was moderately to severelyincreased. PA peak pressure: 64 mm Hg (S). - Pericardium, extracardiac: A trivial pericardial  effusion wasidentified. Impressions: - Normal LV function; grade 1 diastolic dysfunction; moderatelyreduced RV function; mild TR; moderate to severe elevation inpulmonary pressure.  IVC filter placement is pending  Antibiotics: None.   Subjective: The patient slept well last night. However, this morning she feels more short of breath. She was also wheezing more than yesterday. Denies any cough. Complains of right-sided chest pain which has been present previously as well.   Objective: Vital Signs  Filed Vitals:   02/07/15 1916 02/08/15 0000 02/08/15 0010 02/08/15 0345  BP: 92/60  92/55 97/62  Pulse: 89 80 81 77  Temp: 97.6 F (36.4 C) 97.6 F (36.4 C)    TempSrc:  Oral    Resp: 16 17 19 17   Height:      Weight:      SpO2: 90% 91% 96% 91%    Intake/Output Summary (Last 24 hours) at 02/08/15 2683 Last data filed at 02/08/15 0700  Gross per 24 hour  Intake 2105.04 ml  Output   1850 ml  Net 255.04 ml   Filed Weights   02/06/15 1500  Weight: 78.1 kg (172 lb 2.9 oz)    General appearance: alert, cooperative, appears stated age and no distress Resp: Diminished air entry at the bases with coarse breath sounds. Diffuse wheezing bilaterally. Few crackles heard as well. similar to yesterday. Cardio: regular rate and rhythm, S1, S2 normal, no murmur, click, rub or gallop GI: soft, non-tender; bowel sounds normal; no masses,  no organomegaly Extremities: extremities normal, atraumatic, no cyanosis or edema Skin: Skin color, texture, turgor normal. No rashes or lesions Neurologic: No focal deficits.  Lab Results:  Basic Metabolic Panel:  Recent Labs Lab 02/06/15 1010 02/07/15 0333 02/08/15 0253  NA 136 136 134*  K 3.8 3.8 3.8  CL 103 104 103  CO2 23 25 25   GLUCOSE 116* 111* 104*  BUN 22 16 18   CREATININE 1.39* 1.02 1.04  CALCIUM 8.8 8.2* 8.3*   CBC:  Recent Labs Lab 02/06/15 1010 02/07/15 0333 02/08/15 0253  WBC 9.6 8.3 7.4  HGB 8.8* 7.7* 9.4*  HCT  30.3* 26.7* 31.7*  MCV 81.2 80.4 83.2  PLT 278 258 228   Cardiac Enzymes:  Recent Labs Lab 02/06/15 1010 02/06/15 1620 02/06/15 2220 02/07/15 0333  TROPONINI 0.09* 0.07* 0.05* 0.03   BNP (last 3 results)  Recent Labs  02/06/15 1010  BNP 693.7*    Recent Results (from the past 240 hour(s))  MRSA PCR Screening     Status: None   Collection Time: 02/06/15  3:08 PM  Result Value Ref Range Status   MRSA by PCR NEGATIVE NEGATIVE Final    Comment:        The GeneXpert MRSA Assay (FDA approved for NASAL specimens only), is one component of a comprehensive MRSA colonization surveillance program. It is not intended to diagnose MRSA infection nor to guide or monitor treatment for MRSA infections.       Studies/Results: Dg Chest 2 View  02/06/2015   CLINICAL DATA:  Shortness of Breath  EXAM: CHEST  2 VIEW  COMPARISON:  None.  FINDINGS: Cardiomediastinal silhouette is unremarkable. Significant dextroscoliosis lower thoracic spine. Levoscoliosis upper thoracic spine. Small hiatal hernia. No acute infiltrate or pulmonary edema. There is small focal herniation of right posterior diaphragm best seen on lateral view. Thoracic spine osteopenia.  IMPRESSION: Thoracic scoliosis.  No active disease.  Small hiatal hernia.   Electronically Signed   By: Lahoma Crocker M.D.   On: 02/06/2015 10:30   Ct Angio Chest Pe W/cm &/or Wo Cm  02/06/2015   CLINICAL DATA:  Two day history of chest pain and difficulty breathing  EXAM: CT ANGIOGRAPHY CHEST WITH CONTRAST  TECHNIQUE: Multidetector CT imaging of the chest was performed using the standard protocol during bolus administration of intravenous contrast. Multiplanar CT image reconstructions and MIPs were obtained to evaluate the vascular anatomy.  CONTRAST:  34mL OMNIPAQUE IOHEXOL 350 MG/ML SOLN  COMPARISON:  Chest radiograph February 06, 2015  FINDINGS: There is extensive pulmonary embolism arising from the right main pulmonary artery extending into multiple  right lower lobe branches as well as into the anterior segment right upper lobe branch. There are pulmonary emboli in the distal left main pulmonary artery with pulmonary emboli extending into the proximal upper and lower lobe pulmonary arteries. There is right heart strain with the right ventricle to left ventricle diameter ratio measuring 1.5, normal less than 0.9.  There is no thoracic aortic aneurysm or dissection.  There is a moderate hiatal hernia.  There is underlying centrilobular emphysematous change. There is patchy atelectasis in both lung bases, slightly more on the left than on the right.  There is no appreciable thoracic adenopathy. There are multiple foci of coronary artery calcification. The pericardium is not thickened. There is enlargement of the left lobe of the thyroid with a focal calcified nodular area in the left lobe measuring 1.1 x 0.9 cm.  In the visualized upper abdomen, there is hepatic steatosis.  There is degenerative change in the thoracic spine with upper thoracic levoscoliosis. There are no blastic or lytic bone lesions.  Review of the MIP images confirms the above findings.  IMPRESSION: Positive for acute PE with CT evidence of right heart strain (RV/LV Ratio = 1.5) consistent with  at least submassive (intermediate risk) PE. The presence of right heart strain has been associated with an increased risk of morbidity and mortality. Please activate Code PE by paging [phone].  Underlying emphysema with patchy bibasilar atelectatic change, slightly more on the left than on the right.  Enlarged left lobe of thyroid with calcified nodule in left lobe thyroid. Consider further evaluation with thyroid ultrasound. If patient is clinically hyperthyroid, consider nuclear medicine thyroid uptake and scan.  Hepatic steatosis.  No appreciable adenopathy.  Critical Value/emergent results were called by telephone at the time of interpretation on 02/06/2015 at 11:51 am to Dr. Alfonzo Beers , who  verbally acknowledged these results.   Electronically Signed   By: Lowella Grip III M.D.   On: 02/06/2015 11:52    Medications:  Scheduled: . antiseptic oral rinse  7 mL Mouth Rinse BID  . buPROPion  300 mg Oral Daily  . calcium-vitamin D  1 tablet Oral Q breakfast  . clorazepate  3.75 mg Oral BID  . gabapentin  600 mg Oral TID  . iron polysaccharides  150 mg Oral Daily  . lamoTRIgine  200 mg Oral QHS  . loratadine  10 mg Oral Daily  . rOPINIRole  1 mg Oral Daily  . rOPINIRole  2 mg Oral 2 times per day  . sodium chloride  3 mL Intravenous Q12H  . zolpidem  5 mg Oral QHS   Continuous: . sodium chloride Stopped (02/07/15 1939)  . heparin 1,400 Units/hr (02/08/15 0425)   OIZ:[id number] **OR** acetaminophen, HYDROcodone-acetaminophen, [COMPLETED] levalbuterol **FOLLOWED BY** levalbuterol, loperamide, nitroGLYCERIN, ondansetron **OR** ondansetron (ZOFRAN) IV, pantoprazole, zolpidem  Assessment/Plan:  Principal Problem:   Acute pulmonary embolism Active Problems:   Scoliosis of thoracic spine   GERD (gastroesophageal reflux disease)   Acute respiratory failure with hypoxia   Acute kidney injury   Elevated troponin   Normocytic anemia   DVT (deep venous thrombosis)    Acute hypoxic respiratory failure secondary to submassive PE Patient's condition remains tenuous. She however remains hemodynamically stable. Continues to require oxygen, which will be continued. Continue IV heparin infusion. Pulmonology is following. Plan is for IVC filter placement today, but her tenuous respiratory status could make this challenging. Lower extremity Dopplers did reveal bilateral DVT. Hypercoagulable panel is pending. Irrespective of this she will require lifelong anticoagulation. Initiate Coumadin when patient is more stable. 2-D echocardiogram does show moderate to severely elevated pulmonary pressures. There could be a component of COPD exacerbation as well. Continue nebulizer treatments.  May have to consider steroids as well. Will have pulmonology weigh in as well.  Elevated troponin Likely secondary to acute PE. No active chest pain. EKG unremarkable. Follow-up results of echocardiogram.  Normocytic Anemia Patient gives a history of iron deficiency anemia. She is followed by hematologist, Dr. Cruzita Lederer at [facility]. She has received iron infusions. Never has received blood transfusions as outpatient. Hemoglobin did drop down significantly yesterday. No overt bleeding was identified. She was transfused 2 units of blood with good response. I tried to contact her hematologist on 3/28 (Dr. Cruzita Lederer, [phone]), but he is out of town for rest of the week. On-call hematologist hasn't returned my call yet. Check stool for occult blood.  Scoliosis of thoracic spine Pain control with when necessary Vicodin. Patient reports being evaluated for surgery  Chronic back pain Continue to hold NSAIDs. Continue when necessary Vicodin.  Acute kidney injury Mild. Appears to have resolved.   GERD Continue PPI twice a day   DVT Prophylaxis:  On IV heparin    Code Status: Full code  Family Communication: Discussed with the patient. No family at bedside.  Disposition Plan: Remain in step down.    LOS: 2 days   [facility] Pager [phone] 02/08/2015, 7:27 AM  If 7PM-7AM, please contact night-coverage at [email or web address], password [facility]

## 2015-02-08 NOTE — Progress Notes (Signed)
ANTICOAGULATION CONSULT NOTE - Follow Up Consult  Pharmacy Consult for heparin Indication: pulmonary embolus   Labs:  Recent Labs  02/06/15 1010 02/06/15 1620 02/06/15 2220  02/07/15 0333 02/07/15 1112 02/07/15 2036 02/08/15 0253  HGB 8.8*  --   --   --  7.7*  --   --  9.4*  HCT 30.3*  --   --   --  26.7*  --   --  31.7*  PLT 278  --   --   --  258  --   --  228  LABPROT  --   --   --   --   --   --   --  15.7*  INR  --   --   --   --   --   --   --  1.24  HEPARINUNFRC  --  0.36  --   < >  --  0.58 0.43 0.24*  CREATININE 1.39*  --   --   --  1.02  --   --  1.04  TROPONINI 0.09* 0.07* 0.05*  --  0.03  --   --   --   < > = values in this interval not displayed.    Assessment: 74yo female subtherapeutic on heparin after two levels at goal though had been trending down, no gtt issues per RN; Hgb up s/p two units, no active bleeding per RN.  Goal of Therapy:  Heparin level 0.3-0.7 units/ml   Plan:  Will increase heparin gtt by 2 units/kg/hr to 1400 units/hr and check level in [place], PharmD, BCPS  02/08/2015,4:25 AM

## 2015-02-08 NOTE — Procedures (Signed)
Successful IVC filter insertion via RT CFV No comp Stable Full report in PACS

## 2015-02-08 NOTE — Progress Notes (Signed)
Name: [name] May MRN: [id number] DOB: 06/09/1941    ADMISSION DATE:  02/06/2015 CONSULTATION DATE:  02/06/2015  REFERRING MD :  Dr. Clementeen Graham  CHIEF COMPLAINT:  PE  BRIEF PATIENT DESCRIPTION: 74 year old female with PMH of a PE in her 28s treated with a course of coumadin.  She has been becoming more tired lately and has been leading more of a sedentary life style.  No recent trips or plan/car rides.  No family history of clots.  She does endorse however that her right leg has been more swollen than her left and that has been for a couple of years (unsure how many).  Has been on lasix for that.  She reports sudden SOB starting 2 days ago but has been getting worse.  She presented to [facility] ED.  A CT was done that showed bilateral PE R>L.  She was transferred to [facility] under [facility] for treatment.  SIGNIFICANT EVENTS  3/27 Admission for acute PE  STUDIES:  3/27 CTA with bilateral large PE  SUBJECTIVE: Significantly worsening hypoxemia requiring 100% NRB.  VITAL SIGNS: Temp:  [97.6 F (36.4 C)-98.5 F (36.9 C)] 98.2 F (36.8 C) (03/29 1151) Pulse Rate:  [77-105] 101 (03/29 1151) Resp:  [16-23] 20 (03/29 1151) BP: (92-158)/(55-97) 121/64 mmHg (03/29 1151) SpO2:  [89 %-99 %] 95 % (03/29 1151)  PHYSICAL EXAMINATION: General:  Well appearing scoliotic woman, in exam bed in significant respiratory distress. Neuro:  Alert and oriented, moving all ext to command. Head: [place]/AT ENT:  WNL EYES:  PERRL, EOM-I. Cardiovascular:  RRR, Nl S1/S2, -M/R/G. Lungs:  CTA bilaterally. Abdomen:  Soft, NT, ND and +BS. Musculoskeletal:  R leg with 2+ edema and left with 1+ edema. Skin:  Intact  Recent Labs Lab 02/06/15 1010 02/07/15 0333 02/08/15 0253  NA 136 136 134*  K 3.8 3.8 3.8  CL 103 104 103  CO2 23 25 25   BUN 22 16 18   CREATININE 1.39* 1.02 1.04  GLUCOSE 116* 111* 104*    Recent Labs Lab 02/06/15 1010 02/07/15 0333 02/08/15 0253  HGB 8.8* 7.7*  9.4*  HCT 30.3* 26.7* 31.7*  WBC 9.6 8.3 7.4  PLT 278 258 228   Dg Chest Port 1 View  02/08/2015   CLINICAL DATA:  Acute pulmonary emboli identified 2 days ago, now with worsening shortness of breath and wheezing.  EXAM: PORTABLE CHEST - 1 VIEW  COMPARISON:  Two-view chest x-ray and CTA chest 02/06/2015.  FINDINGS: Markedly suboptimal inspiration with atelectasis in the lung bases. Lungs remain clear otherwise. Cardiac silhouette moderately enlarged, unchanged. Pulmonary vascularity normal. Possible small bilateral pleural effusions.  IMPRESSION: 1. Suboptimal inspiration accounts for bibasilar atelectasis. 2. Possible small bilateral pleural effusions. 3.  No acute cardiopulmonary disease otherwise.   Electronically Signed   By: Evangeline Dakin M.D.   On: 02/08/2015 08:33    ASSESSMENT / PLAN:  Acute and severe PE. Hypoxemic respiratory failure. Right heart strain. Right leg swelling. History of tobacco abuse. New and extensive DVT bilaterally.  I have reviewed the CT myself and shows R>L clot burden PE. I have discussed the case with Dr. Clementeen Graham and agreed on plan.  Recommendations: - 2D echo EF intact with right heart dilatation and PAP of 61. - Bilateral lower extremity dopplers extensive DVT bilaterally up to femoral level. - Heparin drip for anti-coagulation. - Hold off starting coumadin for now incase patient needs lytics. - Hold IVC filter placement until more stable. - Discussed  coumadin vs the newer generation anti-coag at length and given that she was on coumadin before and grew comfortable with it she chose coumadin. - Attempt to wean O2 down. - Lasix x2 doses. - Hold ambulation until IVC filter is in. - IS per RT protocol. - When able to ambulate then will need an ambulatory desaturation study to qualify for home O2 as I suspect she will need home O2 for sometime until the clot organizes. - Coumadin for life. - Hypercoagulable panel not finalized. - Keep in SDU as  patient is at high risk for deterioration. - PCCM will follow.  Patient seen a total of 5 times throughout the day.  Diuresed well with 40 of lasix.  I am still concerned for the risk of respiratory failure requiring intubation.  We discussed lytic therapy but patient is improving with diureses and is hyper not hypotensive.  If this was PE driven then patient should be hypotensive as well.  Another consideration is catheter directed lysis but patient is improving.  The patient is critically ill with multiple organ systems failure and requires high complexity decision making for assessment and support, frequent evaluation and titration of therapies, application of advanced monitoring technologies and extensive interpretation of multiple databases.   Critical Care Time devoted to patient care services described in this note is  35  Minutes. This time reflects time of care of this signee Dr Jennet Maduro. This critical care time does not reflect procedure time, or teaching time or supervisory time of PA/NP/Med student/Med Resident etc but could involve care discussion time.  Rush Farmer, M.D. [facility]. Pager: [phone]. After hours pager: [phone].  02/08/2015, 1:31 PM

## 2015-02-08 NOTE — Sedation Documentation (Signed)
Patient denies pain and is resting comfortably.  

## 2015-02-08 NOTE — Progress Notes (Signed)
ANTICOAGULATION CONSULT NOTE - Follow Up Consult  Pharmacy Consult for heparin Indication: pulmonary embolus  No Known Allergies  Patient Measurements: Height: 5' (152.4 cm) Weight: 172 lb 2.9 oz (78.1 kg) IBW/kg (Calculated) : 45.5 Heparin Dosing Weight: 64 kg  Vital Signs: Temp: 98.2 F (36.8 C) (03/29 1151) Temp Source: Oral (03/29 1151) BP: 121/64 mmHg (03/29 1151) Pulse Rate: 101 (03/29 1151)  Labs:  Recent Labs  02/06/15 1010 02/06/15 1620 02/06/15 2220  02/07/15 0333  02/07/15 2036 02/08/15 0253 02/08/15 1300  HGB 8.8*  --   --   --  7.7*  --   --  9.4*  --   HCT 30.3*  --   --   --  26.7*  --   --  31.7*  --   PLT 278  --   --   --  258  --   --  228  --   LABPROT  --   --   --   --   --   --   --  15.7*  --   INR  --   --   --   --   --   --   --  1.24  --   HEPARINUNFRC  --  0.36  --   < >  --   < > 0.43 0.24* 0.43  CREATININE 1.39*  --   --   --  1.02  --   --  1.04  --   TROPONINI 0.09* 0.07* 0.05*  --  0.03  --   --   --   --   < > = values in this interval not displayed.  Estimated Creatinine Clearance: 45.2 mL/min (by C-G formula based on Cr of 1.04).   Medications:  Scheduled:  . antiseptic oral rinse  7 mL Mouth Rinse BID  . buPROPion  300 mg Oral Daily  . calcium-vitamin D  1 tablet Oral Q breakfast  . clorazepate  3.75 mg Oral BID  . fentaNYL      . gabapentin  600 mg Oral TID  . iron polysaccharides  150 mg Oral Daily  . lamoTRIgine  200 mg Oral QHS  . lidocaine      . lidocaine      . loratadine  10 mg Oral Daily  . midazolam      . rOPINIRole  1 mg Oral Daily  . rOPINIRole  2 mg Oral 2 times per day  . sodium chloride  3 mL Intravenous Q12H  . zolpidem  5 mg Oral QHS   Infusions:  . sodium chloride Stopped (02/07/15 1939)  . heparin 1,400 Units/hr (02/08/15 0425)    Assessment: 74 yo female with bilateral PE/DVT on heparin and at goal (HL= 0.43) after increase to 1400 units/hr. Hgb/hct= 9.4/31.7 s/p PRBC x 2 3/28. Plans noted  for IVC filter then coumadin (per patient preference). Per MD notes -  May consider catheter directed lysis but no plans now as patient is improving.  Goal of Therapy:  Heparin level 0.3-0.7 units/ml Monitor platelets by anticoagulation protocol: Yes   Plan:  Continue heparin at 1400 units/hr Daily heparin level and CBC  Hildred Laser, Pharm D 02/08/2015 2:10 PM

## 2015-02-09 LAB — BASIC METABOLIC PANEL
Anion gap: 5 (ref 5–15)
BUN: 16 mg/dL (ref 6–23)
CALCIUM: 8.6 mg/dL (ref 8.4–10.5)
CO2: 30 mmol/L (ref 19–32)
CREATININE: 1.05 mg/dL (ref 0.50–1.10)
Chloride: 99 mmol/L (ref 96–112)
GFR calc non Af Amer: 52 mL/min — ABNORMAL LOW (ref >90)
GFR, EST AFRICAN AMERICAN: 60 mL/min — AB (ref >90)
Glucose, Bld: 90 mg/dL (ref 70–99)
POTASSIUM: 3.6 mmol/L (ref 3.5–5.1)
Sodium: 134 mmol/L — ABNORMAL LOW (ref 135–145)

## 2015-02-09 LAB — CBC
HCT: 33.6 % — ABNORMAL LOW (ref 36.0–46.0)
HEMOGLOBIN: 9.9 g/dL — AB (ref 12.0–15.0)
MCH: 24.6 pg — ABNORMAL LOW (ref 26.0–34.0)
MCHC: 29.5 g/dL — AB (ref 30.0–36.0)
MCV: 83.4 fL (ref 78.0–100.0)
Platelets: 257 10*3/uL (ref 150–400)
RBC: 4.03 MIL/uL (ref 3.87–5.11)
RDW: 16.9 % — ABNORMAL HIGH (ref 11.5–15.5)
WBC: 8.7 10*3/uL (ref 4.0–10.5)

## 2015-02-09 LAB — PROTEIN S ACTIVITY: PROTEIN S ACTIVITY: 56 % — AB (ref 60–145)

## 2015-02-09 LAB — PROTEIN C, TOTAL: Protein C, Total: 71 % (ref 70–140)

## 2015-02-09 LAB — HEPARIN LEVEL (UNFRACTIONATED)
Heparin Unfractionated: 0.17 IU/mL — ABNORMAL LOW (ref 0.30–0.70)
Heparin Unfractionated: 0.44 IU/mL (ref 0.30–0.70)

## 2015-02-09 LAB — GLUCOSE, CAPILLARY
GLUCOSE-CAPILLARY: 107 mg/dL — AB (ref 70–99)
GLUCOSE-CAPILLARY: 92 mg/dL (ref 70–99)
Glucose-Capillary: 81 mg/dL (ref 70–99)

## 2015-02-09 LAB — FACTOR 5 LEIDEN

## 2015-02-09 LAB — HEXAGONAL PHASE PHOSPHOLIPID: Hexagonal Phase Phospholipid: 11.6 s — ABNORMAL HIGH (ref 0.0–8.0)

## 2015-02-09 LAB — LUPUS ANTICOAGULANT PANEL
DRVVT: 55.6 s — AB (ref 0.0–55.1)
PTT LA: 74.5 s — AB (ref 0.0–50.0)

## 2015-02-09 LAB — PROTEIN S, TOTAL: Protein S Ag, Total: 104 % (ref 58–150)

## 2015-02-09 LAB — CARDIOLIPIN ANTIBODIES, IGG, IGM, IGA
Anticardiolipin IgA: <9 APL U/mL (ref 0–11)
Anticardiolipin IgG: <9 GPL U/mL (ref 0–14)

## 2015-02-09 LAB — DRVVT MIX: DRVVT MIX: 48.4 s — AB (ref 0.0–45.4)

## 2015-02-09 LAB — PROTEIN C ACTIVITY: Protein C Activity: 85 % (ref 74–151)

## 2015-02-09 LAB — PTT-LA MIX: PTT-LA Mix: 53.7 s — ABNORMAL HIGH (ref 0.0–50.0)

## 2015-02-09 LAB — DRVVT CONFIRM: dRVVT Confirm: 0.9 ratio (ref 0.0–1.4)

## 2015-02-09 MED ORDER — COUMADIN BOOK
Freq: Once | Status: AC
  Administered 2015-02-10: 14:00:00
  Filled 2015-02-09 (×2): qty 1

## 2015-02-09 MED ORDER — WARFARIN SODIUM 5 MG PO TABS
5.0000 mg | ORAL_TABLET | Freq: Once | ORAL | Status: AC
  Administered 2015-02-09: 5 mg via ORAL
  Filled 2015-02-09: qty 1

## 2015-02-09 MED ORDER — WARFARIN VIDEO: Freq: Once | Status: DC

## 2015-02-09 MED ORDER — WARFARIN - PHARMACIST DOSING INPATIENT
Freq: Every day | Status: DC
  Administered 2015-02-14: 18:00:00

## 2015-02-09 NOTE — Discharge Instructions (Signed)
Information on my medicine - Coumadin   (Warfarin)  This medication education was reviewed with me or my healthcare representative as part of my discharge preparation.  The pharmacist that spoke with me during my hospital stay was:  Dareen Piano, [facility]  Why was Coumadin prescribed for you? Coumadin was prescribed for you because you have a blood clot or a medical condition that can cause an increased risk of forming blood clots. Blood clots can cause serious health problems by blocking the flow of blood to the heart, lung, or brain. Coumadin can prevent harmful blood clots from forming. As a reminder your indication for Coumadin is:   Pulmonary Embolism Treatment  What test will check on my response to Coumadin? While on Coumadin (warfarin) you will need to have an INR test regularly to ensure that your dose is keeping you in the desired range. The INR (international normalized ratio) number is calculated from the result of the laboratory test called prothrombin time (PT).  If an INR APPOINTMENT HAS NOT ALREADY BEEN MADE FOR YOU please schedule an appointment to have this lab work done by your health care provider within 7 days. Your INR goal is usually a number between:  2 to 3 or your provider may give you a more narrow range like 2-2.5.  Ask your health care provider during an office visit what your goal INR is.  What  do you need to  know  About  COUMADIN? Take Coumadin (warfarin) exactly as prescribed by your healthcare provider about the same time each day.  DO NOT stop taking without talking to the doctor who prescribed the medication.  Stopping without other blood clot prevention medication to take the place of Coumadin may increase your risk of developing a new clot or stroke.  Get refills before you run out.  What do you do if you miss a dose? If you miss a dose, take it as soon as you remember on the same day then continue your regularly scheduled regimen the next day.  Do not take  two doses of Coumadin at the same time.  Important Safety Information A possible side effect of Coumadin (Warfarin) is an increased risk of bleeding. You should call your healthcare provider right away if you experience any of the following: ? Bleeding from an injury or your nose that does not stop. ? Unusual colored urine (red or dark brown) or unusual colored stools (red or black). ? Unusual bruising for unknown reasons. ? A serious fall or if you hit your head (even if there is no bleeding).  Some foods or medicines interact with Coumadin (warfarin) and might alter your response to warfarin. To help avoid this: ? Eat a balanced diet, maintaining a consistent amount of Vitamin K. ? Notify your provider about major diet changes you plan to make. ? Avoid alcohol or limit your intake to 1 drink for women and 2 drinks for men per day. (1 drink is 5 oz. wine, 12 oz. beer, or 1.5 oz. liquor.)  Make sure that ANY health care provider who prescribes medication for you knows that you are taking Coumadin (warfarin).  Also make sure the healthcare provider who is monitoring your Coumadin knows when you have started a new medication including herbals and non-prescription products.  Coumadin (Warfarin)  Major Drug Interactions  Increased Warfarin Effect Decreased Warfarin Effect  Alcohol (large quantities) Antibiotics (esp. Septra/Bactrim, Flagyl, Cipro) Amiodarone (Cordarone) Aspirin (ASA) Cimetidine (Tagamet) Megestrol (Megace) NSAIDs (ibuprofen, naproxen, etc.) Piroxicam (  Feldene) °Propafenone (Rythmol SR) °Propranolol (Inderal) °Isoniazid (INH) °Posaconazole (Noxafil) Barbiturates (Phenobarbital) °Carbamazepine (Tegretol) °Chlordiazepoxide (Librium) °Cholestyramine (Questran) °Griseofulvin °Oral Contraceptives °Rifampin °Sucralfate (Carafate) °Vitamin K  ° °Coumadin® (Warfarin) Major Herbal Interactions  °Increased Warfarin Effect Decreased Warfarin Effect  °Garlic °Ginseng °Ginkgo biloba  Coenzyme Q10 °Green tea °St. John’s wort   ° °Coumadin® (Warfarin) FOOD Interactions  °Eat a consistent number of servings per week of foods HIGH in Vitamin K °(1 serving = ½ cup)  °Collards (cooked, or boiled & drained) °Kale (cooked, or boiled & drained) °Mustard greens (cooked, or boiled & drained) °Parsley *serving size only = ¼ cup °Spinach (cooked, or boiled & drained) °Swiss chard (cooked, or boiled & drained) °Turnip greens (cooked, or boiled & drained)  °Eat a consistent number of servings per week of foods MEDIUM-HIGH in Vitamin K °(1 serving = 1 cup)  °Asparagus (cooked, or boiled & drained) °Broccoli (cooked, boiled & drained, or raw & chopped) °Brussel sprouts (cooked, or boiled & drained) *serving size only = ½ cup °Lettuce, raw (green leaf, endive, romaine) °Spinach, raw °Turnip greens, raw & chopped  ° °These websites have more information on Coumadin (warfarin):  www.coumadin.com; °www.ahrq.gov/consumer/coumadin.htm; ° ° °

## 2015-02-09 NOTE — Progress Notes (Signed)
Name: [name] May MRN: [id number] DOB: Apr 26, 1941    ADMISSION DATE:  02/06/2015 CONSULTATION DATE:  02/06/2015  REFERRING MD :  Dr. Clementeen Graham  CHIEF COMPLAINT:  PE  BRIEF PATIENT DESCRIPTION: 74 year old female with PMH of a PE in her 48s treated with a course of coumadin.  She has been becoming more tired lately and has been leading more of a sedentary life style.  No recent trips or plan/car rides.  No family history of clots.  She does endorse however that her right leg has been more swollen than her left and that has been for a couple of years (unsure how many).  Has been on lasix for that.  She reports sudden SOB starting 2 days ago but has been getting worse.  She presented to [facility] ED.  A CT was done that showed bilateral PE R>L.  She was transferred to [facility] on [facility]U for treatment.  SIGNIFICANT EVENTS  3/27 Admission for acute PE  STUDIES:  3/27 CTA with bilateral large PE  SUBJECTIVE: Feels better this AM, down to six liters.  VITAL SIGNS: Temp:  [97.7 F (36.5 C)-98.2 F (36.8 C)] 97.7 F (36.5 C) (03/30 0729) Pulse Rate:  [83-103] 93 (03/30 0729) Resp:  [12-20] 17 (03/30 0729) BP: (83-134)/(49-78) 134/78 mmHg (03/30 0729) SpO2:  [88 %-100 %] 88 % (03/30 0729)  PHYSICAL EXAMINATION: General:  Well appearing scoliotic woman, in exam bed no respiratory distress. Neuro:  Alert and oriented, moving all ext to command. Head: [place]/AT ENT:  WNL EYES:  PERRL, EOM-I. Cardiovascular:  RRR, Nl S1/S2, -M/R/G. Lungs:  CTA bilaterally. Abdomen:  Soft, NT, ND and +BS. Musculoskeletal:  R leg with 2+ edema and left with 1+ edema. Skin:  Intact  Recent Labs Lab 02/07/15 0333 02/08/15 0253 02/09/15 0225  NA 136 134* 134*  K 3.8 3.8 3.6  CL 104 103 99  CO2 25 25 30   BUN 16 18 16   CREATININE 1.02 1.04 1.05  GLUCOSE 111* 104* 90    Recent Labs Lab 02/07/15 0333 02/08/15 0253 02/09/15 0225  HGB 7.7* 9.4* 9.9*  HCT 26.7* 31.7* 33.6*    WBC 8.3 7.4 8.7  PLT 258 228 257   Ir Ivc Filter Plmt / S&i /img Guid/mod Sed  02/08/2015   CLINICAL DATA:  ACUTE BILATERAL PULMONARY EMBOLI WITH RIGHT HEART STRAIN. ACUTE BILATERAL DVT  EXAM: ULTRASOUND GUIDANCE FOR VASCULAR ACCESS  IVC CATHETERIZATION AND VENOGRAM  IVC FILTER INSERTION  Date:  3/29/20163/29/2016 4:53 pm  Radiologist:  M. Daryll Brod, MD  Guidance:  Ultrasound fluoroscopic  FLUOROSCOPY TIME:  2 minutes 6 seconds, 130 mGy  MEDICATIONS AND MEDICAL HISTORY: 0.5 mg Versed, 25 mcg fentanyl  ANESTHESIA/SEDATION: 15 minutes  CONTRAST:  70mL OMNIPAQUE IOHEXOL 300 MG/ML  SOLN  COMPLICATIONS: None immediate  PROCEDURE: Informed consent was obtained from the patient following explanation of the procedure, risks, benefits and alternatives. The patient understands, agrees and consents for the procedure. All questions were addressed. A time out was performed.  Maximal barrier sterile technique utilized including caps, mask, sterile gowns, sterile gloves, large sterile drape, hand hygiene, and betadine prep.  Under sterile condition and local anesthesia, right common femoral venous access was performed with ultrasound. Over a guide wire, the IVC filter delivery sheath and inner dilator were advanced into the IVC just above the IVC bifurcation. Contrast injection was performed for an IVC venogram.  IVC VENOGRAM: The IVC is patent. No evidence of thrombus, stenosis,  or occlusion. No variant venous anatomy. The renal veins are identified at L1.  IVC FILTER INSERTION: Through the delivery sheath, the Bard Denali IVC filter was deployed in the infrarenal IVC at the L2-3 level just below the renal veins and above the IVC bifurcation. Contrast injection confirmed position. There is good apposition of the filter against the IVC.  The delivery sheath was removed and hemostasis was obtained with compression for 5 minutes. The patient tolerated the procedure well. No immediate complications.  IMPRESSION: Ultrasound  and fluoroscopically guided infrarenal IVC filter insertion.   Electronically Signed   By: Jerilynn Mages.  Shick M.D.   On: 02/08/2015 17:01   Dg Chest Port 1 View  02/08/2015   CLINICAL DATA:  Acute pulmonary emboli identified 2 days ago, now with worsening shortness of breath and wheezing.  EXAM: PORTABLE CHEST - 1 VIEW  COMPARISON:  Two-view chest x-ray and CTA chest 02/06/2015.  FINDINGS: Markedly suboptimal inspiration with atelectasis in the lung bases. Lungs remain clear otherwise. Cardiac silhouette moderately enlarged, unchanged. Pulmonary vascularity normal. Possible small bilateral pleural effusions.  IMPRESSION: 1. Suboptimal inspiration accounts for bibasilar atelectasis. 2. Possible small bilateral pleural effusions. 3.  No acute cardiopulmonary disease otherwise.   Electronically Signed   By: Evangeline Dakin M.D.   On: 02/08/2015 08:33   ASSESSMENT / PLAN:  Acute and severe PE. Hypoxemic respiratory failure. Right heart strain. Right leg swelling. History of tobacco abuse. New and extensive DVT bilaterally.  I have reviewed the CT myself and shows R>L clot burden PE. Plan discussed with RN, patient and daughter.  Recommendations: - 2D echo EF intact with right heart dilatation and PAP of 61. - Bilateral lower extremity dopplers extensive DVT bilaterally up to femoral level. - Heparin drip for anti-coagulation, begin coumdin. - IVC filter in place after patient improved with diureses yesterday. - Discussed coumadin vs the newer generation anti-coag at length and given that she was on coumadin before and grew comfortable with it she chose coumadin. - Attempt to wean O2 down. - IS per RT protocol. - Titrate O2 for sat of 88-92%. - OOB to chair. - PT evaluation and treatment. - Ambulatory desaturation study to qualify for home O2, anticipate will need it. - Coumadin for life. - Hypercoagulable panel not finalized. - Keep in SDU as patient is at high risk for deterioration. - PCCM will  follow.  Rush Farmer, M.D. [facility] Pulmonary/Critical Care Medicine. Pager: [phone]. After hours pager: [phone].  02/09/2015, 10:35 AM

## 2015-02-09 NOTE — Progress Notes (Signed)
[facility] PROGRESS NOTE  [name] May DXA:[id number] DOB: [birth year] DOA: 02/06/2015 PCP: Wonda Cerise, MD  Brief HPI: 74 year old Caucasian female with a past medical history of scoliosis, GERD, remote history of PE, presented to the hospital with shortness of breath. She was found to have submassive pulmonary embolism with evidence for right heart strain. Patient was admitted to the hospital for further management.  Assessment/Plan: Acute hypoxic respiratory failure secondary to submassive PE - pulmonology on board and assisting with management.  - Currently on IV heparin infussion. - s/p IVC filter placement  Elevated troponin Likely secondary to acute PE. No active chest pain. EKG unremarkable. - s/p echocardiogram and reported no wall motion abnormality.  Normocytic Anemia Patient gives a history of iron deficiency anemia. She is followed by hematologist, Dr. Cruzita Lederer at [facility]. She has received iron infusions. Never has received blood transfusions as outpatient. Hemoglobin did drop down significantly yesterday. No overt bleeding was identified. She was transfused 2 units of blood with good response.  - agree with checking stools for occult blood.  Scoliosis of thoracic spine Pain control with when necessary Vicodin.   Chronic back pain Continue to hold NSAIDs. Continue when necessary Vicodin.  Acute kidney injury Mild. Appears to have resolved.   GERD Continue PPI twice a day  Code Status: full Family Communication: none at bedside Disposition Plan:  Pending improvement in condition for not continue to monitor in 2H   Consultants:  Critical care  Procedures: 2-D echocardiogram Study Conclusions - Left ventricle: The cavity size was normal. There was mild focalbasal hypertrophy of the septum. Systolic function was normal.The estimated ejection fraction was in the range of 55% to 60%.Wall motion was normal; there were no regional wall  motionabnormalities. Doppler parameters are consistent with abnormalleft ventricular relaxation (grade 1 diastolic dysfunction). - Ventricular septum: The contour showed diastolic flattening andsystolic flattening. - Mitral valve: Calcified annulus. - Right ventricle: Systolic function was moderately reduced. - Atrial septum: The septum bowed from right to left, consistentwith increased right atrial pressure. - Pulmonary arteries: Systolic pressure was moderately to severelyincreased. PA peak pressure: 64 mm Hg (S). - Pericardium, extracardiac: A trivial pericardial effusion wasidentified. Impressions: - Normal LV function; grade 1 diastolic dysfunction; moderatelyreduced RV function; mild TR; moderate to severe elevation inpulmonary pressure.  IVC filter placement is pending  Antibiotics:  None  HPI/Subjective: Patient has no new complaints. States that with activity gets short of breath  Objective: Filed Vitals:   02/09/15 0729  BP: 134/78  Pulse: 93  Temp: 97.7 F (36.5 C)  Resp: 17    Intake/Output Summary (Last 24 hours) at 02/09/15 1118 Last data filed at 02/09/15 0800  Gross per 24 hour  Intake    358 ml  Output      0 ml  Net    358 ml   Filed Weights   02/06/15 1500  Weight: 78.1 kg (172 lb 2.9 oz)    Exam:   General:  Patient in no acute distress, alert and awake  Cardiovascular: Regular rate and rhythm, no murmurs or rubs  Respiratory: Patient with nasal cannula in place,no wheezes  Abdomen: Soft, nondistended, nontender  Musculoskeletal: No cyanosis or clubbing on limited exam   Data Reviewed: Basic Metabolic Panel:  Recent Labs Lab 02/06/15 1010 02/07/15 0333 02/08/15 0253 02/09/15 0225  NA 136 136 134* 134*  K 3.8 3.8 3.8 3.6  CL 103 104 103 99  CO2 23 25 25 30   GLUCOSE 116* 111* 104* 90  BUN 22 16 18 16   CREATININE 1.39* 1.02 1.04 1.05  CALCIUM 8.8 8.2* 8.3* 8.6   Liver Function Tests:  Recent Labs Lab 02/07/15 1112  02/08/15 0253  AST 19 28  ALT 12 14  ALKPHOS 70 74  BILITOT 0.7 0.9  PROT 6.9 6.7  ALBUMIN 2.8* 2.7*   No results for input(s): LIPASE, AMYLASE in the last 168 hours. No results for input(s): AMMONIA in the last 168 hours. CBC:  Recent Labs Lab 02/06/15 1010 02/07/15 0333 02/08/15 0253 02/09/15 0225  WBC 9.6 8.3 7.4 8.7  HGB 8.8* 7.7* 9.4* 9.9*  HCT 30.3* 26.7* 31.7* 33.6*  MCV 81.2 80.4 83.2 83.4  PLT 278 258 228 257   Cardiac Enzymes:  Recent Labs Lab 02/06/15 1010 02/06/15 1620 02/06/15 2220 02/07/15 0333  TROPONINI 0.09* 0.07* 0.05* 0.03   BNP (last 3 results)  Recent Labs  02/06/15 1010  BNP 693.7*    ProBNP (last 3 results) No results for input(s): PROBNP in the last 8760 hours.  CBG:  Recent Labs Lab 02/09/15 0730  GLUCAP 81    Recent Results (from the past 240 hour(s))  MRSA PCR Screening     Status: None   Collection Time: 02/06/15  3:08 PM  Result Value Ref Range Status   MRSA by PCR NEGATIVE NEGATIVE Final    Comment:        The GeneXpert MRSA Assay (FDA approved for NASAL specimens only), is one component of a comprehensive MRSA colonization surveillance program. It is not intended to diagnose MRSA infection nor to guide or monitor treatment for MRSA infections.      Studies: Ir Ivc Filter Plmt / S&i /img Guid/mod Sed  02/08/2015   CLINICAL DATA:  ACUTE BILATERAL PULMONARY EMBOLI WITH RIGHT HEART STRAIN. ACUTE BILATERAL DVT  EXAM: ULTRASOUND GUIDANCE FOR VASCULAR ACCESS  IVC CATHETERIZATION AND VENOGRAM  IVC FILTER INSERTION  Date:  3/29/20163/29/2016 4:53 pm  Radiologist:  M. Daryll Brod, MD  Guidance:  Ultrasound fluoroscopic  FLUOROSCOPY TIME:  2 minutes 6 seconds, 130 mGy  MEDICATIONS AND MEDICAL HISTORY: 0.5 mg Versed, 25 mcg fentanyl  ANESTHESIA/SEDATION: 15 minutes  CONTRAST:  79mL OMNIPAQUE IOHEXOL 300 MG/ML  SOLN  COMPLICATIONS: None immediate  PROCEDURE: Informed consent was obtained from the patient following  explanation of the procedure, risks, benefits and alternatives. The patient understands, agrees and consents for the procedure. All questions were addressed. A time out was performed.  Maximal barrier sterile technique utilized including caps, mask, sterile gowns, sterile gloves, large sterile drape, hand hygiene, and betadine prep.  Under sterile condition and local anesthesia, right common femoral venous access was performed with ultrasound. Over a guide wire, the IVC filter delivery sheath and inner dilator were advanced into the IVC just above the IVC bifurcation. Contrast injection was performed for an IVC venogram.  IVC VENOGRAM: The IVC is patent. No evidence of thrombus, stenosis, or occlusion. No variant venous anatomy. The renal veins are identified at L1.  IVC FILTER INSERTION: Through the delivery sheath, the Bard Denali IVC filter was deployed in the infrarenal IVC at the L2-3 level just below the renal veins and above the IVC bifurcation. Contrast injection confirmed position. There is good apposition of the filter against the IVC.  The delivery sheath was removed and hemostasis was obtained with compression for 5 minutes. The patient tolerated the procedure well. No immediate complications.  IMPRESSION: Ultrasound and fluoroscopically guided infrarenal IVC filter insertion.   Electronically Signed   By: Jerilynn Mages.  Shick M.D.   On: 02/08/2015 17:01   Dg Chest Port 1 View  02/08/2015   CLINICAL DATA:  Acute pulmonary emboli identified 2 days ago, now with worsening shortness of breath and wheezing.  EXAM: PORTABLE CHEST - 1 VIEW  COMPARISON:  Two-view chest x-ray and CTA chest 02/06/2015.  FINDINGS: Markedly suboptimal inspiration with atelectasis in the lung bases. Lungs remain clear otherwise. Cardiac silhouette moderately enlarged, unchanged. Pulmonary vascularity normal. Possible small bilateral pleural effusions.  IMPRESSION: 1. Suboptimal inspiration accounts for bibasilar atelectasis. 2. Possible  small bilateral pleural effusions. 3.  No acute cardiopulmonary disease otherwise.   Electronically Signed   By: Evangeline Dakin M.D.   On: 02/08/2015 08:33    Scheduled Meds: . antiseptic oral rinse  7 mL Mouth Rinse BID  . buPROPion  300 mg Oral Daily  . calcium-vitamin D  1 tablet Oral Q breakfast  . clorazepate  3.75 mg Oral BID  . gabapentin  600 mg Oral BID  . iron polysaccharides  150 mg Oral Daily  . lamoTRIgine  200 mg Oral QHS  . loratadine  10 mg Oral Daily  . rOPINIRole  1 mg Oral Daily  . rOPINIRole  2 mg Oral 2 times per day  . sodium chloride  3 mL Intravenous Q12H  . zolpidem  5 mg Oral QHS   Continuous Infusions: . sodium chloride Stopped (02/07/15 1939)  . heparin 1,400 Units/hr (02/09/15 0800)    Principal Problem:   Acute pulmonary embolism Active Problems:   Scoliosis of thoracic spine   GERD (gastroesophageal reflux disease)   Acute respiratory failure with hypoxia   Acute kidney injury   Elevated troponin   Normocytic anemia   DVT (deep venous thrombosis)   PE (pulmonary embolism)   Shortness of breath    Time spent: > 35 minutes    Velvet Bathe  [facility] Pager [phone]. If 7PM-7AM, please contact night-coverage at [email or web address], password [facility] 02/09/2015, 11:18 AM  LOS: 3 days

## 2015-02-09 NOTE — Evaluation (Signed)
Physical Therapy Evaluation Patient Details Name: [name] May MRN: [id number] DOB: 10/01/1941 Today's Date: 02/09/2015   History of Present Illness  Pt adm with bil PE. Bil DVT's found. Pt had IVC filter placed. PMH - scoliosis,  Clinical Impression  Pt admitted with above diagnosis. Pt currently with functional limitations due to the deficits listed below (see PT Problem List).  Pt will benefit from skilled PT to increase their independence and safety with mobility to allow discharge to the venue listed below.  Expect pt will make good progress and be able to return home.     Follow Up Recommendations Home health PT;Supervision - Intermittent    Equipment Recommendations  None recommended by PT    Recommendations for Other Services OT consult     Precautions / Restrictions Precautions Precautions: Fall Precaution Comments: Decr SaO2      Mobility  Bed Mobility Overal bed mobility: Needs Assistance Bed Mobility: Supine to Sit     Supine to sit: Supervision     General bed mobility comments: Incr time  Transfers Overall transfer level: Needs assistance Equipment used: 4-wheeled walker Transfers: Sit to/from Stand Sit to Stand: Min guard         General transfer comment: Assist for safety and balance  Ambulation/Gait Ambulation/Gait assistance: Min guard Ambulation Distance (Feet): 45 Feet (x 2) Assistive device: 4-wheeled walker Gait Pattern/deviations: Step-through pattern;Decreased stride length   Gait velocity interpretation: Below normal speed for age/gender General Gait Details: Pt amb on 6L with SaO2 87-90%. Pt required 1 standing rest break.  Stairs            Wheelchair Mobility    Modified Rankin (Stroke Patients Only)       Balance Overall balance assessment: Needs assistance Sitting-balance support: No upper extremity supported;Feet supported Sitting balance-Leahy Scale: Normal     Standing balance support: No upper extremity  supported Standing balance-Leahy Scale: Fair                               Pertinent Vitals/Pain Pain Assessment: No/denies pain    Home Living Family/patient expects to be discharged to:: Private residence Living Arrangements: Alone Available Help at Discharge: Family Type of Home: House Home Access: Stairs to enter   Technical brewer of Steps: 1 Home Layout: One level Home Equipment: Environmental consultant - 4 wheels;Cane - single point      Prior Function Level of Independence: Independent with assistive device(s)         Comments: Uses cane primarily when out.     Hand Dominance        Extremity/Trunk Assessment   Upper Extremity Assessment: Generalized weakness           Lower Extremity Assessment: Generalized weakness         Communication      Cognition Arousal/Alertness: Awake/alert Behavior During Therapy: WFL for tasks assessed/performed Overall Cognitive Status: Within Functional Limits for tasks assessed                      General Comments      Exercises        Assessment/Plan    PT Assessment Patient needs continued PT services  PT Diagnosis Difficulty walking;Generalized weakness   PT Problem List Decreased strength;Decreased activity tolerance;Decreased balance;Decreased mobility  PT Treatment Interventions DME instruction;Balance training;Gait training;Functional mobility training;Patient/family education;Therapeutic activities;Therapeutic exercise   PT Goals (Current goals can be found in the Care  Plan section) Acute Rehab PT Goals Patient Stated Goal: Return home PT Goal Formulation: With patient Time For Goal Achievement: 02/16/15 Potential to Achieve Goals: Good    Frequency Min 3X/week   Barriers to discharge        Co-evaluation               End of Session Equipment Utilized During Treatment: Oxygen Activity Tolerance: Patient limited by fatigue Patient left: in chair;with call bell/phone  within reach Nurse Communication: Mobility status         Time: 1141-1208 PT Time Calculation (min) (ACUTE ONLY): 27 min   Charges:   PT Evaluation $Initial PT Evaluation Tier I: 1 Procedure PT Treatments $Gait Training: 8-22 mins   PT G Codes:        [name] May Mar 01, 2015, 12:52 PM  [employer] PT [phone]

## 2015-02-09 NOTE — Progress Notes (Addendum)
ANTICOAGULATION CONSULT NOTE - Follow Up Consult  Pharmacy Consult for heparin, coumadin Indication: pulmonary embolus  No Known Allergies  Patient Measurements: Height: 5' (152.4 cm) Weight: 172 lb 2.9 oz (78.1 kg) IBW/kg (Calculated) : 45.5 Heparin Dosing Weight: 64 kg  Vital Signs: Temp: 97.4 F (36.3 C) (03/30 1139) Temp Source: Oral (03/30 1139) BP: 104/64 mmHg (03/30 1139) Pulse Rate: 94 (03/30 1139)  Labs:  Recent Labs  02/06/15 1620 02/06/15 2220  02/07/15 0333  02/08/15 0253 02/08/15 1300 02/09/15 0225 02/09/15 0230 02/09/15 0810  HGB  --   --   < > 7.7*  --  9.4*  --  9.9*  --   --   HCT  --   --   --  26.7*  --  31.7*  --  33.6*  --   --   PLT  --   --   --  258  --  228  --  257  --   --   LABPROT  --   --   --   --   --  15.7*  --   --   --   --   INR  --   --   --   --   --  1.24  --   --   --   --   HEPARINUNFRC 0.36  --   < >  --   < > 0.24* 0.43  --  0.17* 0.44  CREATININE  --   --   --  1.02  --  1.04  --  1.05  --   --   TROPONINI 0.07* 0.05*  --  0.03  --   --   --   --   --   --   < > = values in this interval not displayed.  Estimated Creatinine Clearance: 44.7 mL/min (by C-G formula based on Cr of 1.05).   Medications:  Scheduled:  . antiseptic oral rinse  7 mL Mouth Rinse BID  . buPROPion  300 mg Oral Daily  . calcium-vitamin D  1 tablet Oral Q breakfast  . clorazepate  3.75 mg Oral BID  . coumadin book   Does not apply Once  . gabapentin  600 mg Oral BID  . iron polysaccharides  150 mg Oral Daily  . lamoTRIgine  200 mg Oral QHS  . loratadine  10 mg Oral Daily  . rOPINIRole  1 mg Oral Daily  . rOPINIRole  2 mg Oral 2 times per day  . sodium chloride  3 mL Intravenous Q12H  . warfarin  5 mg Oral ONCE-1800  . Warfarin - Pharmacist Dosing Inpatient   Does not apply q1800  . zolpidem  5 mg Oral QHS   Infusions:  . sodium chloride Stopped (02/07/15 1939)  . heparin 1,400 Units/hr (02/09/15 0800)    Assessment: 74 yo female with  bilateral PE/DVT on heparin and at goal (HL= 0.44) on 1400 units/hr. Hgb/hct= 9.9/33.6 s/p PRBC x 2 on 3/28. Now s/p IVC filter placement 02/07/14 on to begin coumadin today (day 1 or minimum 5 day overlap with heparin). Baseline INR= 1.24  Goal of Therapy:  Heparin level 0.3-0.7 units/ml Monitor platelets by anticoagulation protocol: Yes   Plan:  -Continue heparin at 1400 units/hr -Daily heparin level and CBC -Coumadin 5mg  po today -Daily PT/INR -Will provide coumadin education  Hildred Laser, Pharm D 02/09/2015 12:04 PM

## 2015-02-10 DIAGNOSIS — I82403 Acute embolism and thrombosis of unspecified deep veins of lower extremity, bilateral: Secondary | ICD-10-CM | POA: Diagnosis present

## 2015-02-10 LAB — CBC
HCT: 35.6 % — ABNORMAL LOW (ref 36.0–46.0)
Hemoglobin: 10.5 g/dL — ABNORMAL LOW (ref 12.0–15.0)
MCH: 24.7 pg — ABNORMAL LOW (ref 26.0–34.0)
MCHC: 29.5 g/dL — ABNORMAL LOW (ref 30.0–36.0)
MCV: 83.8 fL (ref 78.0–100.0)
PLATELETS: 282 10*3/uL (ref 150–400)
RBC: 4.25 MIL/uL (ref 3.87–5.11)
RDW: 17.8 % — AB (ref 11.5–15.5)
WBC: 8.3 10*3/uL (ref 4.0–10.5)

## 2015-02-10 LAB — PROTIME-INR
INR: 1.19 (ref 0.00–1.49)
Prothrombin Time: 15.3 seconds — ABNORMAL HIGH (ref 11.6–15.2)

## 2015-02-10 LAB — PROTHROMBIN GENE MUTATION

## 2015-02-10 LAB — HEPARIN LEVEL (UNFRACTIONATED)
HEPARIN UNFRACTIONATED: 0.55 [IU]/mL (ref 0.30–0.70)
Heparin Unfractionated: 0.18 IU/mL — ABNORMAL LOW (ref 0.30–0.70)
Heparin Unfractionated: 0.51 IU/mL (ref 0.30–0.70)

## 2015-02-10 MED ORDER — HEPARIN BOLUS VIA INFUSION
1000.0000 [IU] | Freq: Once | INTRAVENOUS | Status: AC
  Administered 2015-02-10: 1000 [IU] via INTRAVENOUS
  Filled 2015-02-10: qty 1000

## 2015-02-10 MED ORDER — WARFARIN SODIUM 5 MG PO TABS
5.0000 mg | ORAL_TABLET | Freq: Once | ORAL | Status: AC
  Administered 2015-02-10: 5 mg via ORAL
  Filled 2015-02-10 (×2): qty 1

## 2015-02-10 NOTE — Progress Notes (Signed)
[facility] PROGRESS NOTE  [name] May VWP:[id number] DOB: [birth year] DOA: 02/06/2015 PCP: Wonda Cerise, MD  Brief narrative 74 year old female with history of GERD, remote history of PE, history of tobacco use, scoliosis presented with shortness of breath for the past 2 days and found to have a submassive right-sided PE with bilateral DVT. Patient admitted to stepdown and placed on heparin drip. She then had an IVC filter placed in.  Assessment/Plan: Acute hypoxic respiratory failure secondary to  submassive PE Admitted to stepdown On IV heparin drip. Given bilateral significant DVTs patient underwent IVC filter placement. -Appreciate pulmonary consult and recommendation. Patient has been started on Coumadin since yesterday. -Right heart strain commended on CT scan was not evident on 2-D echo. -She will need to be on lifelong Coumadin.  Elevated troponin Likely secondary to acute PE. EKG unremarkable. Echo without wall motion abnormality.  Normocytic/ iron deficiency anemia Patient transfusion 2 units PRBC with good improvement. Reports following up with hematologist in [facility].  Acute kidney injury on admission Mild. Resolved  Chronic back pain Underlying scoliosis. Pain control with when necessary Vicodin  GERD Continue PPI   Code Status: Full code Family Communication: Daughter and son-in-law at bedside Disposition Plan: Transfer to telemetry. Home Once INR therapeutic. Likely will be in the next 3-4 days. Home health is recommended by PT   Consultants:  Pulmonary  Procedures:  CT angiogram of the chest  2-D echo  IVC filter placement by IR  Antibiotics:  None  HPI/Subjective: Since seen and examined. Reports mild chest discomfort with deep inspiration. O2 sat stable on 2 L via nasal cannula. Denies any other symptoms. No overnight issues  Objective: Filed Vitals:   02/10/15 1151  BP: 127/62  Pulse: 88  Temp: 98.5 F (36.9 C)  Resp: 22     Intake/Output Summary (Last 24 hours) at 02/10/15 1301 Last data filed at 02/10/15 0600  Gross per 24 hour  Intake 443.88 ml  Output      0 ml  Net 443.88 ml   Filed Weights   02/06/15 1500  Weight: 78.1 kg (172 lb 2.9 oz)    Exam:   General:  Elderly female in no acute distress  HEENT: Pallor present, moist oral mucosa, supple neck  Chest: Clear to auscultation bilaterally, no added sounds  CVS: Normal S1 and S2, no murmurs rub or gallop  GI: Soft, nondistended, nontender, bowel sounds present  Musculoskeletal: Warm, no edema  CNS: Alert and oriented   Data Reviewed: Basic Metabolic Panel:  Recent Labs Lab 02/06/15 1010 02/07/15 0333 02/08/15 0253 02/09/15 0225  NA 136 136 134* 134*  K 3.8 3.8 3.8 3.6  CL 103 104 103 99  CO2 23 25 25 30   GLUCOSE 116* 111* 104* 90  BUN 22 16 18 16   CREATININE 1.39* 1.02 1.04 1.05  CALCIUM 8.8 8.2* 8.3* 8.6   Liver Function Tests:  Recent Labs Lab 02/07/15 1112 02/08/15 0253  AST 19 28  ALT 12 14  ALKPHOS 70 74  BILITOT 0.7 0.9  PROT 6.9 6.7  ALBUMIN 2.8* 2.7*   No results for input(s): LIPASE, AMYLASE in the last 168 hours. No results for input(s): AMMONIA in the last 168 hours. CBC:  Recent Labs Lab 02/06/15 1010 02/07/15 0333 02/08/15 0253 02/09/15 0225 02/10/15 0252  WBC 9.6 8.3 7.4 8.7 8.3  HGB 8.8* 7.7* 9.4* 9.9* 10.5*  HCT 30.3* 26.7* 31.7* 33.6* 35.6*  MCV 81.2 80.4 83.2 83.4 83.8  PLT 278 258 228  257 282   Cardiac Enzymes:  Recent Labs Lab 02/06/15 1010 02/06/15 1620 02/06/15 2220 02/07/15 0333  TROPONINI 0.09* 0.07* 0.05* 0.03   BNP (last 3 results)  Recent Labs  02/06/15 1010  BNP 693.7*    ProBNP (last 3 results) No results for input(s): PROBNP in the last 8760 hours.  CBG:  Recent Labs Lab 02/09/15 0730 02/09/15 1141 02/09/15 1639  GLUCAP 81 107* 92    Recent Results (from the past 240 hour(s))  MRSA PCR Screening     Status: None   Collection Time:  02/06/15  3:08 PM  Result Value Ref Range Status   MRSA by PCR NEGATIVE NEGATIVE Final    Comment:        The GeneXpert MRSA Assay (FDA approved for NASAL specimens only), is one component of a comprehensive MRSA colonization surveillance program. It is not intended to diagnose MRSA infection nor to guide or monitor treatment for MRSA infections.      Studies: Ir Ivc Filter Plmt / S&i /img Guid/mod Sed  02/08/2015   CLINICAL DATA:  ACUTE BILATERAL PULMONARY EMBOLI WITH RIGHT HEART STRAIN. ACUTE BILATERAL DVT  EXAM: ULTRASOUND GUIDANCE FOR VASCULAR ACCESS  IVC CATHETERIZATION AND VENOGRAM  IVC FILTER INSERTION  Date:  3/29/20163/29/2016 4:53 pm  Radiologist:  M. Daryll Brod, MD  Guidance:  Ultrasound fluoroscopic  FLUOROSCOPY TIME:  2 minutes 6 seconds, 130 mGy  MEDICATIONS AND MEDICAL HISTORY: 0.5 mg Versed, 25 mcg fentanyl  ANESTHESIA/SEDATION: 15 minutes  CONTRAST:  17mL OMNIPAQUE IOHEXOL 300 MG/ML  SOLN  COMPLICATIONS: None immediate  PROCEDURE: Informed consent was obtained from the patient following explanation of the procedure, risks, benefits and alternatives. The patient understands, agrees and consents for the procedure. All questions were addressed. A time out was performed.  Maximal barrier sterile technique utilized including caps, mask, sterile gowns, sterile gloves, large sterile drape, hand hygiene, and betadine prep.  Under sterile condition and local anesthesia, right common femoral venous access was performed with ultrasound. Over a guide wire, the IVC filter delivery sheath and inner dilator were advanced into the IVC just above the IVC bifurcation. Contrast injection was performed for an IVC venogram.  IVC VENOGRAM: The IVC is patent. No evidence of thrombus, stenosis, or occlusion. No variant venous anatomy. The renal veins are identified at L1.  IVC FILTER INSERTION: Through the delivery sheath, the Bard Denali IVC filter was deployed in the infrarenal IVC at the L2-3 level  just below the renal veins and above the IVC bifurcation. Contrast injection confirmed position. There is good apposition of the filter against the IVC.  The delivery sheath was removed and hemostasis was obtained with compression for 5 minutes. The patient tolerated the procedure well. No immediate complications.  IMPRESSION: Ultrasound and fluoroscopically guided infrarenal IVC filter insertion.   Electronically Signed   By: Jerilynn Mages.  Shick M.D.   On: 02/08/2015 17:01    Scheduled Meds: . antiseptic oral rinse  7 mL Mouth Rinse BID  . buPROPion  300 mg Oral Daily  . calcium-vitamin D  1 tablet Oral Q breakfast  . clorazepate  3.75 mg Oral BID  . coumadin book   Does not apply Once  . gabapentin  600 mg Oral BID  . iron polysaccharides  150 mg Oral Daily  . lamoTRIgine  200 mg Oral QHS  . loratadine  10 mg Oral Daily  . rOPINIRole  1 mg Oral Daily  . rOPINIRole  2 mg Oral 2 times per day  .  sodium chloride  3 mL Intravenous Q12H  . warfarin   Does not apply Once  . Warfarin - Pharmacist Dosing Inpatient   Does not apply q1800  . zolpidem  5 mg Oral QHS   Continuous Infusions: . sodium chloride Stopped (02/07/15 1939)  . heparin 1,650 Units/hr (02/10/15 0600)      Time spent: 25 minutes    Ashwin Tibbs  [facility] Pager [phone]. If 7PM-7AM, please contact night-coverage at [email or web address], password [facility] 02/10/2015, 1:01 PM  LOS: 4 days

## 2015-02-10 NOTE — Progress Notes (Addendum)
ANTICOAGULATION CONSULT NOTE - Follow Up Consult  Pharmacy Consult for heparin, coumadin Indication: pulmonary embolus  No Known Allergies  Patient Measurements: Height: 5' (152.4 cm) Weight: 172 lb 2.9 oz (78.1 kg) IBW/kg (Calculated) : 45.5 Heparin Dosing Weight: 64 kg  Vital Signs: Temp: 98.5 F (36.9 C) (03/31 1151) Temp Source: Oral (03/31 1151) BP: 127/62 mmHg (03/31 1151) Pulse Rate: 88 (03/31 1151)  Labs:  Recent Labs  02/08/15 0253  02/09/15 0225  02/09/15 0810 02/10/15 0252 02/10/15 1122  HGB 9.4*  --  9.9*  --   --  10.5*  --   HCT 31.7*  --  33.6*  --   --  35.6*  --   PLT 228  --  257  --   --  282  --   LABPROT 15.7*  --   --   --   --  15.3*  --   INR 1.24  --   --   --   --  1.19  --   HEPARINUNFRC 0.24*  < >  --   < > 0.44 0.18* 0.55  CREATININE 1.04  --  1.05  --   --   --   --   < > = values in this interval not displayed.  Estimated Creatinine Clearance: 44.7 mL/min (by C-G formula based on Cr of 1.05).   Medications:  Scheduled:  . antiseptic oral rinse  7 mL Mouth Rinse BID  . buPROPion  300 mg Oral Daily  . calcium-vitamin D  1 tablet Oral Q breakfast  . clorazepate  3.75 mg Oral BID  . coumadin book   Does not apply Once  . gabapentin  600 mg Oral BID  . iron polysaccharides  150 mg Oral Daily  . lamoTRIgine  200 mg Oral QHS  . loratadine  10 mg Oral Daily  . rOPINIRole  1 mg Oral Daily  . rOPINIRole  2 mg Oral 2 times per day  . sodium chloride  3 mL Intravenous Q12H  . warfarin  5 mg Oral ONCE-1800  . warfarin   Does not apply Once  . Warfarin - Pharmacist Dosing Inpatient   Does not apply q1800  . zolpidem  5 mg Oral QHS   Infusions:  . sodium chloride Stopped (02/07/15 1939)  . heparin 1,650 Units/hr (02/10/15 0600)    Assessment: 74 yo female with bilateral PE/DVT on heparin and at goal (HL= 0.44) on 1400 units/hr. Hgb/hct= 10.5/35.6 s/p PRBC x 2 on 3/28. Now s/p IVC filter placement 02/07/14 on coumadin (day 2 of minimum 5  day overlap with heparin). INR today= 1.19 and heparin level = 0.55 after increase to 1650 units/hr.   Goal of Therapy:  Heparin level 0.3-0.7 units/ml Monitor platelets by anticoagulation protocol: Yes   Plan:  -Continue heparin at 1650 units/hr -Heparin level later today to confirm -Daily heparin level and CBC -Coumadin 5mg  po today -Daily PT/INR  Hildred Laser, Pharm D 02/10/2015 1:14 PM    ===============================   Addendum: - confirmatory HL 0.51, therapeutic - no bleeding reported    Plan: - continue heparin gtt at 1650 units/hr - f/u AM labs    Emily Forse D. Mina Marble, PharmD, BCPS Pager:  [phone] 02/10/2015, 7:59 PM

## 2015-02-10 NOTE — Progress Notes (Signed)
ANTICOAGULATION CONSULT NOTE - Follow Up Consult  Pharmacy Consult for heparin Indication: pulmonary embolus   Labs:  Recent Labs  02/07/15 0333  02/08/15 0253  02/09/15 0225 02/09/15 0230 02/09/15 0810 02/10/15 0252  HGB 7.7*  --  9.4*  --  9.9*  --   --  10.5*  HCT 26.7*  --  31.7*  --  33.6*  --   --  35.6*  PLT 258  --  228  --  257  --   --  282  LABPROT  --   --  15.7*  --   --   --   --  15.3*  INR  --   --  1.24  --   --   --   --  1.19  HEPARINUNFRC  --   < > 0.24*  < >  --  0.17* 0.44 0.18*  CREATININE 1.02  --  1.04  --  1.05  --   --   --   TROPONINI 0.03  --   --   --   --   --   --   --   < > = values in this interval not displayed.    Assessment: 74yo female subtherapeutic on heparin after one level at goal after rate increase.  Goal of Therapy:  Heparin level 0.3-0.7 units/ml   Plan:  Will give small heparin bolus of 1000 units and increase heparin gtt by 3 units/kg/hr to 1650 units/hr and check level in [place], PharmD, BCPS  02/10/2015,3:32 AM

## 2015-02-10 NOTE — Progress Notes (Signed)
Physical Therapy Evaluation Patient Details Name: [name] May MRN: [id number] DOB: Apr 01, 1941 Today's Date: 2015/02/20   History of Present Illness  Pt adm with bil PE. Bil DVT's found. Pt had IVC filter placed. PMH - scoliosis,  Clinical Impression  Pt making good progress.    Follow Up Recommendations Home health PT;Supervision - Intermittent    Equipment Recommendations  None recommended by PT    Recommendations for Other Services OT consult     Precautions / Restrictions Precautions Precautions: Fall Precaution Comments: Decr SaO2      Mobility  Bed Mobility                  Transfers Overall transfer level: Needs assistance Equipment used: 4-wheeled walker Transfers: Sit to/from Stand Sit to Stand: Min guard         General transfer comment: Incr effort required from low recliner.  Ambulation/Gait Ambulation/Gait assistance: Supervision Ambulation Distance (Feet): 90 Feet (x 2) Assistive device: 4-wheeled walker Gait Pattern/deviations: Step-through pattern;Decreased stride length   Gait velocity interpretation: Below normal speed for age/gender General Gait Details: Pt with 1 seated rest break. Pt on 4L of O2 with SaO2 91-93%.  Stairs            Wheelchair Mobility    Modified Rankin (Stroke Patients Only)       Balance Overall balance assessment: Needs assistance Sitting-balance support: No upper extremity supported;Feet supported Sitting balance-Leahy Scale: Normal     Standing balance support: No upper extremity supported Standing balance-Leahy Scale: Fair                               Pertinent Vitals/Pain Pain Assessment: No/denies pain    Home Living                        Prior Function                 Hand Dominance        Extremity/Trunk Assessment                         Communication      Cognition Arousal/Alertness: Awake/alert Behavior During Therapy: WFL for  tasks assessed/performed Overall Cognitive Status: Within Functional Limits for tasks assessed                      General Comments      Exercises        Assessment/Plan    PT Assessment    PT Diagnosis     PT Problem List    PT Treatment Interventions     PT Goals (Current goals can be found in the Care Plan section)      Frequency Min 3X/week   Barriers to discharge        Co-evaluation               End of Session Equipment Utilized During Treatment: Oxygen Activity Tolerance: Patient tolerated treatment well Patient left: in chair;with call bell/phone within reach;with family/visitor present Nurse Communication: Mobility status         Time: 1120-1140 PT Time Calculation (min) (ACUTE ONLY): 20 min   Charges:     PT Treatments $Gait Training: 8-22 mins   PT G Codes:        [name] May 02-20-2015, 1:43 PM  [employer] PT [phone]

## 2015-02-10 NOTE — Progress Notes (Signed)
Name: [name] May MRN: [id number] DOB: [birth year]    ADMISSION DATE:  02/06/2015 CONSULTATION DATE:  02/06/2015  REFERRING MD :  Dr. Clementeen Graham  CHIEF COMPLAINT:  PE  BRIEF PATIENT DESCRIPTION: 74 year old female with PMH of a PE in her 72s treated with a course of coumadin.  She has been becoming more tired lately and has been leading more of a sedentary life style.  No recent trips or plan/car rides.  No family history of clots.  She does endorse however that her right leg has been more swollen than her left and that has been for a couple of years (unsure how many).  Has been on lasix for that.  She reports sudden SOB starting 2 days ago but has been getting worse.  She presented to [facility] ED.  A CT was done that showed bilateral PE R>L.  She was transferred to [facility] under [facility] for treatment.  SIGNIFICANT EVENTS  3/27 Admission for acute PE  STUDIES:  3/27 CTA with bilateral large PE  SUBJECTIVE: Feels better this AM, down to six liters.  VITAL SIGNS: Temp:  [97.6 F (36.4 C)-98.8 F (37.1 C)] 98.5 F (36.9 C) (03/31 1151) Pulse Rate:  [80-101] 88 (03/31 1151) Resp:  [16-28] 22 (03/31 1151) BP: (101-155)/(52-118) 127/62 mmHg (03/31 1151) SpO2:  [91 %-94 %] 93 % (03/31 1201)  PHYSICAL EXAMINATION: General:  Well appearing scoliotic woman, in exam bed no respiratory distress. Neuro:  Alert and oriented, moving all ext to command. Head: [place]/AT ENT:  WNL EYES:  PERRL, EOM-I. Cardiovascular:  RRR, Nl S1/S2, -M/R/G. Lungs:  CTA bilaterally. Abdomen:  Soft, NT, ND and +BS. Musculoskeletal:  R leg with 2+ edema and left with 1+ edema. Skin:  Intact  Recent Labs Lab 02/07/15 0333 02/08/15 0253 02/09/15 0225  NA 136 134* 134*  K 3.8 3.8 3.6  CL 104 103 99  CO2 25 25 30   BUN 16 18 16   CREATININE 1.02 1.04 1.05  GLUCOSE 111* 104* 90    Recent Labs Lab 02/08/15 0253 02/09/15 0225 02/10/15 0252  HGB 9.4* 9.9* 10.5*  HCT 31.7* 33.6* 35.6*   WBC 7.4 8.7 8.3  PLT 228 257 282   Ir Ivc Filter Plmt / S&i /img Guid/mod Sed  02/08/2015   CLINICAL DATA:  ACUTE BILATERAL PULMONARY EMBOLI WITH RIGHT HEART STRAIN. ACUTE BILATERAL DVT  EXAM: ULTRASOUND GUIDANCE FOR VASCULAR ACCESS  IVC CATHETERIZATION AND VENOGRAM  IVC FILTER INSERTION  Date:  3/29/20163/29/2016 4:53 pm  Radiologist:  M. Daryll Brod, MD  Guidance:  Ultrasound fluoroscopic  FLUOROSCOPY TIME:  2 minutes 6 seconds, 130 mGy  MEDICATIONS AND MEDICAL HISTORY: 0.5 mg Versed, 25 mcg fentanyl  ANESTHESIA/SEDATION: 15 minutes  CONTRAST:  30mL OMNIPAQUE IOHEXOL 300 MG/ML  SOLN  COMPLICATIONS: None immediate  PROCEDURE: Informed consent was obtained from the patient following explanation of the procedure, risks, benefits and alternatives. The patient understands, agrees and consents for the procedure. All questions were addressed. A time out was performed.  Maximal barrier sterile technique utilized including caps, mask, sterile gowns, sterile gloves, large sterile drape, hand hygiene, and betadine prep.  Under sterile condition and local anesthesia, right common femoral venous access was performed with ultrasound. Over a guide wire, the IVC filter delivery sheath and inner dilator were advanced into the IVC just above the IVC bifurcation. Contrast injection was performed for an IVC venogram.  IVC VENOGRAM: The IVC is patent. No evidence of thrombus, stenosis, or  occlusion. No variant venous anatomy. The renal veins are identified at L1.  IVC FILTER INSERTION: Through the delivery sheath, the Bard Denali IVC filter was deployed in the infrarenal IVC at the L2-3 level just below the renal veins and above the IVC bifurcation. Contrast injection confirmed position. There is good apposition of the filter against the IVC.  The delivery sheath was removed and hemostasis was obtained with compression for 5 minutes. The patient tolerated the procedure well. No immediate complications.  IMPRESSION: Ultrasound  and fluoroscopically guided infrarenal IVC filter insertion.   Electronically Signed   By: Jerilynn Mages.  Shick M.D.   On: 02/08/2015 17:01   ASSESSMENT / PLAN:  Acute and severe PE. Hypoxemic respiratory failure. Right heart strain. Right leg swelling. History of tobacco abuse. New and extensive DVT bilaterally.  I have reviewed the CT myself and shows R>L clot burden PE. Plan discussed with RN, patient and daughter.  Recommendations: - 2D echo EF intact with right heart dilatation and PAP of 61. - Bilateral lower extremity dopplers extensive DVT bilaterally up to femoral level. - Heparin drip for anti-coagulation, continue coumdin. - IVC filter in place retrieve in 6 months. - Attempt to wean O2 down. - IS per RT protocol. - Titrate O2 for sat of 88-92%. - OOB to chair. - PT evaluation and treatment. - Ambulatory desaturation study to qualify for home O2. - Coumadin for life. - Hypercoagulable panel not finalized. - May transfer to tele. - PCCM will sign off, please call back if needed.  Rush Farmer, M.D. [facility] Pulmonary/Critical Care Medicine. Pager: [phone]. After hours pager: [phone].  02/10/2015, 3:18 PM

## 2015-02-11 LAB — CBC
HCT: 32.5 % — ABNORMAL LOW (ref 36.0–46.0)
Hemoglobin: 9.5 g/dL — ABNORMAL LOW (ref 12.0–15.0)
MCH: 24.5 pg — AB (ref 26.0–34.0)
MCHC: 29.2 g/dL — AB (ref 30.0–36.0)
MCV: 83.8 fL (ref 78.0–100.0)
PLATELETS: 294 10*3/uL (ref 150–400)
RBC: 3.88 MIL/uL (ref 3.87–5.11)
RDW: 18.1 % — AB (ref 11.5–15.5)
WBC: 7 10*3/uL (ref 4.0–10.5)

## 2015-02-11 LAB — PROTIME-INR
INR: 1.26 (ref 0.00–1.49)
PROTHROMBIN TIME: 15.9 s — AB (ref 11.6–15.2)

## 2015-02-11 LAB — HEPARIN LEVEL (UNFRACTIONATED): HEPARIN UNFRACTIONATED: 0.55 [IU]/mL (ref 0.30–0.70)

## 2015-02-11 LAB — CLOSTRIDIUM DIFFICILE BY PCR: CDIFFPCR: NEGATIVE

## 2015-02-11 MED ORDER — WARFARIN SODIUM 5 MG PO TABS
5.0000 mg | ORAL_TABLET | Freq: Once | ORAL | Status: AC
  Administered 2015-02-11: 5 mg via ORAL
  Filled 2015-02-11: qty 1

## 2015-02-11 NOTE — Progress Notes (Signed)
ANTICOAGULATION CONSULT NOTE - Follow Up Consult  Pharmacy Consult for heparin, coumadin Indication: pulmonary embolus  No Known Allergies  Patient Measurements: Height: 5' (152.4 cm) Weight: 172 lb 2.9 oz (78.1 kg) IBW/kg (Calculated) : 45.5 Heparin Dosing Weight: 64 kg  Vital Signs: Temp: 98.3 F (36.8 C) (04/01 0758) Temp Source: Oral (04/01 0758) BP: 138/81 mmHg (04/01 0758) Pulse Rate: 88 (04/01 0758)  Labs:  Recent Labs  02/09/15 0225  02/10/15 0252 02/10/15 1122 02/10/15 1900 02/11/15 0408  HGB 9.9*  --  10.5*  --   --  9.5*  HCT 33.6*  --  35.6*  --   --  32.5*  PLT 257  --  282  --   --  294  LABPROT  --   --  15.3*  --   --  15.9*  INR  --   --  1.19  --   --  1.26  HEPARINUNFRC  --   < > 0.18* 0.55 0.51 0.55  CREATININE 1.05  --   --   --   --   --   < > = values in this interval not displayed.  Estimated Creatinine Clearance: 44.7 mL/min (by C-G formula based on Cr of 1.05).   Medications:  Scheduled:  . antiseptic oral rinse  7 mL Mouth Rinse BID  . buPROPion  300 mg Oral Daily  . calcium-vitamin D  1 tablet Oral Q breakfast  . clorazepate  3.75 mg Oral BID  . gabapentin  600 mg Oral BID  . iron polysaccharides  150 mg Oral Daily  . lamoTRIgine  200 mg Oral QHS  . loratadine  10 mg Oral Daily  . rOPINIRole  1 mg Oral Daily  . rOPINIRole  2 mg Oral 2 times per day  . sodium chloride  3 mL Intravenous Q12H  . warfarin   Does not apply Once  . Warfarin - Pharmacist Dosing Inpatient   Does not apply q1800  . zolpidem  5 mg Oral QHS   Infusions:  . sodium chloride Stopped (02/07/15 1939)  . heparin 1,650 Units/hr (02/11/15 0530)    Assessment: 74 yo female with bilateral PE/DVT on heparin . Hgb/hct= 9.5/32.5 s/p PRBC x 2 on 3/28. Now s/p IVC filter placement 02/07/14 on coumadin (day 3 of minimum 5 day overlap with heparin). INR today= 1.26 and heparin level = 0.55  Goal of Therapy:  Heparin level 0.3-0.7 units/ml Monitor platelets by  anticoagulation protocol: Yes   Plan:  -Continue heparin at 1650 units/hr -Daily heparin level and CBC -Coumadin 5mg  po today -Daily PT/INR Thanks for allowing pharmacy to be a part of this patient's care.  Excell Seltzer, PharmD Clinical Pharmacist, [phone]  02/11/2015, 8:02 AM

## 2015-02-11 NOTE — Progress Notes (Signed)
PROGRESS NOTE  Misty Foutz VOZ:[id number] DOB: 11-17-1940 DOA: 02/06/2015 PCP: Wonda Cerise, MD  Assessment/Plan: Acute hypoxic respiratory failure secondary to submassive PE Admitted to stepdown On IV heparin drip.  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 -Appreciate pulmonary consult and recommendation.  -started on Coumadin  -Right heart strain commended on CT scan was not evident on 2-D echo. -She will need to be on lifelong Coumadin. -Factor V Leiden positive  -Continue heparin bridge   Elevated troponin Likely secondary to acute PE. EKG unremarkable. Echo without wall motion abnormality. -No chest pain presently   Normocytic/ iron deficiency anemia Patient transfusion 2 units PRBC with good improvement. Reports following up with hematologist in [facility].  Acute kidney injury on admission Mild. Resolved -Serum creatinine 1.39 on 02/06/2015 -Repeat BMP in the morning   Chronic back pain Underlying scoliosis. Pain control with when necessary Vicodin  GERD Continue PPI  Diarrhea  -C. difficile PCR     Family Communication:   Daughter updated at beside Disposition Plan:   Home when medically stable       Procedures/Studies: Dg Chest 2 View  02/06/2015   CLINICAL DATA:  Shortness of Breath  EXAM: CHEST  2 VIEW  COMPARISON:  None.  FINDINGS: Cardiomediastinal silhouette is unremarkable. Significant dextroscoliosis lower thoracic spine. Levoscoliosis upper thoracic spine. Small hiatal hernia. No acute infiltrate or pulmonary edema. There is small focal herniation of right posterior diaphragm best seen on lateral view. Thoracic spine osteopenia.  IMPRESSION: Thoracic scoliosis.  No active disease.  Small hiatal hernia.   Electronically Signed   By: Lahoma Crocker M.D.   On: 02/06/2015 10:30   Ct Angio Chest Pe W/cm &/or Wo Cm  02/06/2015   CLINICAL DATA:  Two day history of chest pain and difficulty  breathing  EXAM: CT ANGIOGRAPHY CHEST WITH CONTRAST  TECHNIQUE: Multidetector CT imaging of the chest was performed using the standard protocol during bolus administration of intravenous contrast. Multiplanar CT image reconstructions and MIPs were obtained to evaluate the vascular anatomy.  CONTRAST:  17mL OMNIPAQUE IOHEXOL 350 MG/ML SOLN  COMPARISON:  Chest radiograph February 06, 2015  FINDINGS: There is extensive pulmonary embolism arising from the right main pulmonary artery extending into multiple right lower lobe branches as well as into the anterior segment right upper lobe branch. There are pulmonary emboli in the distal left main pulmonary artery with pulmonary emboli extending into the proximal upper and lower lobe pulmonary arteries. There is right heart strain with the right ventricle to left ventricle diameter ratio measuring 1.5, normal less than 0.9.  There is no thoracic aortic aneurysm or dissection.  There is a moderate hiatal hernia.  There is underlying centrilobular emphysematous change. There is patchy atelectasis in both lung bases, slightly more on the left than on the right.  There is no appreciable thoracic adenopathy. There are multiple foci of coronary artery calcification. The pericardium is not thickened. There is enlargement of the left lobe of the thyroid with a focal calcified nodular area in the left lobe measuring 1.1 x 0.9 cm.  In the visualized upper abdomen, there is hepatic steatosis.  There is degenerative change in the thoracic spine with upper thoracic levoscoliosis. There are no blastic or lytic bone lesions.  Review of the MIP images confirms the above findings.  IMPRESSION: Positive for acute PE with CT evidence of right heart strain (RV/LV Ratio = 1.5) consistent with  at least submassive (intermediate risk) PE. The presence of right heart strain has been associated with an increased risk of morbidity and mortality. Please activate Code PE by paging [phone].  Underlying  emphysema with patchy bibasilar atelectatic change, slightly more on the left than on the right.  Enlarged left lobe of thyroid with calcified nodule in left lobe thyroid. Consider further evaluation with thyroid ultrasound. If patient is clinically hyperthyroid, consider nuclear medicine thyroid uptake and scan.  Hepatic steatosis.  No appreciable adenopathy.  Critical Value/emergent results were called by telephone at the time of interpretation on 02/06/2015 at 11:51 am to Dr. Alfonzo Beers , who verbally acknowledged these results.   Electronically Signed   By: Lowella Grip III M.D.   On: 02/06/2015 11:52   Ir Ivc Filter Plmt / S&i /img Guid/mod Sed  02/08/2015   CLINICAL DATA:  ACUTE BILATERAL PULMONARY EMBOLI WITH RIGHT HEART STRAIN. ACUTE BILATERAL DVT  EXAM: ULTRASOUND GUIDANCE FOR VASCULAR ACCESS  IVC CATHETERIZATION AND VENOGRAM  IVC FILTER INSERTION  Date:  3/29/20163/29/2016 4:53 pm  Radiologist:  M. Daryll Brod, MD  Guidance:  Ultrasound fluoroscopic  FLUOROSCOPY TIME:  2 minutes 6 seconds, 130 mGy  MEDICATIONS AND MEDICAL HISTORY: 0.5 mg Versed, 25 mcg fentanyl  ANESTHESIA/SEDATION: 15 minutes  CONTRAST:  50mL OMNIPAQUE IOHEXOL 300 MG/ML  SOLN  COMPLICATIONS: None immediate  PROCEDURE: Informed consent was obtained from the patient following explanation of the procedure, risks, benefits and alternatives. The patient understands, agrees and consents for the procedure. All questions were addressed. A time out was performed.  Maximal barrier sterile technique utilized including caps, mask, sterile gowns, sterile gloves, large sterile drape, hand hygiene, and betadine prep.  Under sterile condition and local anesthesia, right common femoral venous access was performed with ultrasound. Over a guide wire, the IVC filter delivery sheath and inner dilator were advanced into the IVC just above the IVC bifurcation. Contrast injection was performed for an IVC venogram.  IVC VENOGRAM: The IVC is patent. No  evidence of thrombus, stenosis, or occlusion. No variant venous anatomy. The renal veins are identified at L1.  IVC FILTER INSERTION: Through the delivery sheath, the Bard Denali IVC filter was deployed in the infrarenal IVC at the L2-3 level just below the renal veins and above the IVC bifurcation. Contrast injection confirmed position. There is good apposition of the filter against the IVC.  The delivery sheath was removed and hemostasis was obtained with compression for 5 minutes. The patient tolerated the procedure well. No immediate complications.  IMPRESSION: Ultrasound and fluoroscopically guided infrarenal IVC filter insertion.   Electronically Signed   By: Jerilynn Mages.  Shick M.D.   On: 02/08/2015 17:01   Dg Chest Port 1 View  02/08/2015   CLINICAL DATA:  Acute pulmonary emboli identified 2 days ago, now with worsening shortness of breath and wheezing.  EXAM: PORTABLE CHEST - 1 VIEW  COMPARISON:  Two-view chest x-ray and CTA chest 02/06/2015.  FINDINGS: Markedly suboptimal inspiration with atelectasis in the lung bases. Lungs remain clear otherwise. Cardiac silhouette moderately enlarged, unchanged. Pulmonary vascularity normal. Possible small bilateral pleural effusions.  IMPRESSION: 1. Suboptimal inspiration accounts for bibasilar atelectasis. 2. Possible small bilateral pleural effusions. 3.  No acute cardiopulmonary disease otherwise.   Electronically Signed   By: Evangeline Dakin M.D.   On: 02/08/2015 08:33         Subjective: Patient is having loose stools but denies any hematochezia or melena. Breathing overall is better but still has some dyspnea  on exertion. Denies any fevers, chills, hemoptysis, chest pain, nausea, vomiting, abdominal pain. No dysuria.  Objective: Filed Vitals:   02/10/15 1953 02/11/15 0421 02/11/15 0531 02/11/15 0758  BP: 141/68 85/51 99/52  138/81  Pulse: 98 84  88  Temp: 98.4 F (36.9 C) 98.3 F (36.8 C)  98.3 F (36.8 C)  TempSrc: Oral Oral  Oral  Resp: 18 16  20     Height:      Weight:      SpO2: 94% 90%  92%    Intake/Output Summary (Last 24 hours) at 02/11/15 1206 Last data filed at 02/10/15 1300  Gross per 24 hour  Intake    240 ml  Output      0 ml  Net    240 ml   Weight change:  Exam:   General:  Pt is alert, follows commands appropriately, not in acute distress  HEENT: No icterus, No thrush,  [place]/AT  Cardiovascular: RRR, S1/S2, no rubs, no gallops  Respiratory: Diminished breath sounds. Bibasilar crackles. No wheeze.   Abdomen: Soft/+BS, non tender, non distended, no guarding  Extremities: 1+LE edema, No lymphangitis, No petechiae, No rashes, no synovitis  Data Reviewed: Basic Metabolic Panel:  Recent Labs Lab 02/06/15 1010 02/07/15 0333 02/08/15 0253 02/09/15 0225  NA 136 136 134* 134*  K 3.8 3.8 3.8 3.6  CL 103 104 103 99  CO2 23 25 25 30   GLUCOSE 116* 111* 104* 90  BUN 22 16 18 16   CREATININE 1.39* 1.02 1.04 1.05  CALCIUM 8.8 8.2* 8.3* 8.6   Liver Function Tests:  Recent Labs Lab 02/07/15 1112 02/08/15 0253  AST 19 28  ALT 12 14  ALKPHOS 70 74  BILITOT 0.7 0.9  PROT 6.9 6.7  ALBUMIN 2.8* 2.7*   No results for input(s): LIPASE, AMYLASE in the last 168 hours. No results for input(s): AMMONIA in the last 168 hours. CBC:  Recent Labs Lab 02/07/15 0333 02/08/15 0253 02/09/15 0225 02/10/15 0252 02/11/15 0408  WBC 8.3 7.4 8.7 8.3 7.0  HGB 7.7* 9.4* 9.9* 10.5* 9.5*  HCT 26.7* 31.7* 33.6* 35.6* 32.5*  MCV 80.4 83.2 83.4 83.8 83.8  PLT 258 228 257 282 294   Cardiac Enzymes:  Recent Labs Lab 02/06/15 1010 02/06/15 1620 02/06/15 2220 02/07/15 0333  TROPONINI 0.09* 0.07* 0.05* 0.03   BNP: Invalid input(s): POCBNP CBG:  Recent Labs Lab 02/09/15 0730 02/09/15 1141 02/09/15 1639  GLUCAP 81 107* 92    Recent Results (from the past 240 hour(s))  MRSA PCR Screening     Status: None   Collection Time: 02/06/15  3:08 PM  Result Value Ref Range Status   MRSA by PCR NEGATIVE NEGATIVE  Final    Comment:        The GeneXpert MRSA Assay (FDA approved for NASAL specimens only), is one component of a comprehensive MRSA colonization surveillance program. It is not intended to diagnose MRSA infection nor to guide or monitor treatment for MRSA infections.      Scheduled Meds: . antiseptic oral rinse  7 mL Mouth Rinse BID  . buPROPion  300 mg Oral Daily  . calcium-vitamin D  1 tablet Oral Q breakfast  . clorazepate  3.75 mg Oral BID  . gabapentin  600 mg Oral BID  . iron polysaccharides  150 mg Oral Daily  . lamoTRIgine  200 mg Oral QHS  . loratadine  10 mg Oral Daily  . rOPINIRole  1 mg Oral Daily  . rOPINIRole  2  mg Oral 2 times per day  . sodium chloride  3 mL Intravenous Q12H  . warfarin  5 mg Oral ONCE-1800  . warfarin   Does not apply Once  . Warfarin - Pharmacist Dosing Inpatient   Does not apply q1800  . zolpidem  5 mg Oral QHS   Continuous Infusions: . sodium chloride Stopped (02/07/15 1939)  . heparin 1,650 Units/hr (02/11/15 0530)     Jamare Vanatta, DO  [facility] Pager [phone]  If 7PM-7AM, please contact night-coverage [email or web address] Password [email or web address] 02/11/2015, 12:06 PM   LOS: 5 days

## 2015-02-12 LAB — BASIC METABOLIC PANEL
ANION GAP: 3 — AB (ref 5–15)
BUN: 8 mg/dL (ref 6–23)
CALCIUM: 9 mg/dL (ref 8.4–10.5)
CO2: 29 mmol/L (ref 19–32)
CREATININE: 0.83 mg/dL (ref 0.50–1.10)
Chloride: 105 mmol/L (ref 96–112)
GFR calc Af Amer: 80 mL/min — ABNORMAL LOW (ref >90)
GFR calc non Af Amer: 69 mL/min — ABNORMAL LOW (ref >90)
GLUCOSE: 90 mg/dL (ref 70–99)
POTASSIUM: 3.6 mmol/L (ref 3.5–5.1)
Sodium: 137 mmol/L (ref 135–145)

## 2015-02-12 LAB — HEPARIN LEVEL (UNFRACTIONATED)
HEPARIN UNFRACTIONATED: 0.76 [IU]/mL — AB (ref 0.30–0.70)
HEPARIN UNFRACTIONATED: 0.45 [IU]/mL (ref 0.30–0.70)
Heparin Unfractionated: 0.76 IU/mL — ABNORMAL HIGH (ref 0.30–0.70)

## 2015-02-12 LAB — CBC
HCT: 36.5 % (ref 36.0–46.0)
Hemoglobin: 10.6 g/dL — ABNORMAL LOW (ref 12.0–15.0)
MCH: 24.4 pg — ABNORMAL LOW (ref 26.0–34.0)
MCHC: 29 g/dL — AB (ref 30.0–36.0)
MCV: 83.9 fL (ref 78.0–100.0)
PLATELETS: 334 10*3/uL (ref 150–400)
RBC: 4.35 MIL/uL (ref 3.87–5.11)
RDW: 18 % — AB (ref 11.5–15.5)
WBC: 6.5 10*3/uL (ref 4.0–10.5)

## 2015-02-12 LAB — PROTIME-INR
INR: 1.31 (ref 0.00–1.49)
Prothrombin Time: 16.4 seconds — ABNORMAL HIGH (ref 11.6–15.2)

## 2015-02-12 MED ORDER — WARFARIN SODIUM 7.5 MG PO TABS
7.5000 mg | ORAL_TABLET | Freq: Once | ORAL | Status: AC
Start: 2015-02-12 — End: 2015-02-12
  Administered 2015-02-12: 7.5 mg via ORAL
  Filled 2015-02-12: qty 1

## 2015-02-12 MED ORDER — NA FERRIC GLUC CPLX IN SUCROSE 12.5 MG/ML IV SOLN
125.0000 mg | Freq: Once | INTRAVENOUS | Status: AC
  Administered 2015-02-12: 125 mg via INTRAVENOUS
  Filled 2015-02-12: qty 10

## 2015-02-12 NOTE — Progress Notes (Signed)
ANTICOAGULATION CONSULT NOTE - Follow Up Consult  Pharmacy Consult for Heparin Indication: PE  No Known Allergies  Patient Measurements: Height: 5' (152.4 cm) Weight: 172 lb 2.9 oz (78.1 kg) IBW/kg (Calculated) : 45.5 Heparin Dosing Weight: 64 kg  Vital Signs: Temp: 97.5 F (36.4 C) (04/02 2002) Temp Source: Oral (04/02 2002) BP: 135/95 mmHg (04/02 2002) Pulse Rate: 96 (04/02 2002)  Labs:  Recent Labs  02/10/15 0252  02/11/15 0408 02/12/15 0451 02/12/15 1300 02/12/15 2107  HGB 10.5*  --  9.5* 10.6*  --   --   HCT 35.6*  --  32.5* 36.5  --   --   PLT 282  --  294 334  --   --   LABPROT 15.3*  --  15.9* 16.4*  --   --   INR 1.19  --  1.26 1.31  --   --   HEPARINUNFRC 0.18*  < > 0.55 0.76* 0.76* 0.45  CREATININE  --   --   --  0.83  --   --   < > = values in this interval not displayed.  Estimated Creatinine Clearance: 56.6 mL/min (by C-G formula based on Cr of 0.83).   Assessment: Anticoagulation - new bilateral PE with right heart strain (per CT) and doppler 3/28 shows bilateral LLE DVT. Heparin begun at [facility]. Heparin level 0.76>0.45 now in goal range.  Goal of Therapy:  Heparin level 0.3-0.7 units/ml Monitor platelets by anticoagulation protocol: Yes   Plan:  Continue IV heparin at 1400 units/hr Next HL and CBC in AM   Haedyn Ancrum S. Alford Highland, PharmD, [place] Clinical Staff Pharmacist Pager [place], [place] 02/12/2015,10:12 PM

## 2015-02-12 NOTE — Progress Notes (Signed)
PROGRESS NOTE  [name] May PTW:[id number] DOB: October 05, 1941 DOA: 02/06/2015 PCP: Wonda Cerise, MD  Brief History 74 year old female with PMH of a PE in her 59s treated with a course of coumadin. She has been becoming more tired lately and has been leading more of a sedentary life style. She denies recent trips or plan/car rides or family history of clots. She does endorse however that her right leg has been more swollen for which she has been on lasix for that. She reports sudden SOB starting 2 days prior to admission but it has been getting worse. She presented to [facility] ED. A CT was done that showed bilateral PE R>L. Assessment/Plan: Acute hypoxic respiratory failure secondary to submassive PE Admitted to stepdown On IV heparin drip.  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 -Appreciate pulmonary consult and recommendation.  -started on Coumadin  -Right heart strain commended on CT scan was not evident on 2-D echo. -She will need to be on lifelong Coumadin. -Factor V Leiden positive  -Continue heparin bridge   Elevated troponin -secondary to acute PE. EKG unremarkable. Echo without wall motion abnormality. -No chest pain presently   Normocytic/ iron deficiency anemia -Patient transfusion 2 units PRBC with good improvement.  -Reports following up with hematologist in [facility]. -give IV dose nulecit  Acute kidney injury on admission -Mild. Resolved -Serum creatinine 1.39 on 02/06/2015 -Repeat BMP in the morning   Chronic back pain Underlying scoliosis. Pain control with when necessary Vicodin  GERD Continue PPI  Diarrhea  -C. difficile PCR--neg   Family Communication:   Daughter updated at beside Disposition Plan:   Home when medically stable       Procedures/Studies: Dg Chest 2 View  02/06/2015   CLINICAL DATA:  Shortness of Breath  EXAM: CHEST  2 VIEW  COMPARISON:  None.   FINDINGS: Cardiomediastinal silhouette is unremarkable. Significant dextroscoliosis lower thoracic spine. Levoscoliosis upper thoracic spine. Small hiatal hernia. No acute infiltrate or pulmonary edema. There is small focal herniation of right posterior diaphragm best seen on lateral view. Thoracic spine osteopenia.  IMPRESSION: Thoracic scoliosis.  No active disease.  Small hiatal hernia.   Electronically Signed   By: Lahoma Crocker M.D.   On: 02/06/2015 10:30   Ct Angio Chest Pe W/cm &/or Wo Cm  02/06/2015   CLINICAL DATA:  Two day history of chest pain and difficulty breathing  EXAM: CT ANGIOGRAPHY CHEST WITH CONTRAST  TECHNIQUE: Multidetector CT imaging of the chest was performed using the standard protocol during bolus administration of intravenous contrast. Multiplanar CT image reconstructions and MIPs were obtained to evaluate the vascular anatomy.  CONTRAST:  56mL OMNIPAQUE IOHEXOL 350 MG/ML SOLN  COMPARISON:  Chest radiograph February 06, 2015  FINDINGS: There is extensive pulmonary embolism arising from the right main pulmonary artery extending into multiple right lower lobe branches as well as into the anterior segment right upper lobe branch. There are pulmonary emboli in the distal left main pulmonary artery with pulmonary emboli extending into the proximal upper and lower lobe pulmonary arteries. There is right heart strain with the right ventricle to left ventricle diameter ratio measuring 1.5, normal less than 0.9.  There is no thoracic aortic aneurysm or dissection.  There is a moderate hiatal hernia.  There is underlying centrilobular emphysematous change. There is patchy atelectasis in both lung bases, slightly more on the left than on the right.  There is no appreciable thoracic adenopathy. There are multiple foci of coronary artery calcification. The pericardium is not thickened. There is enlargement of the left lobe of the thyroid with a focal calcified nodular area in the left lobe measuring 1.1 x  0.9 cm.  In the visualized upper abdomen, there is hepatic steatosis.  There is degenerative change in the thoracic spine with upper thoracic levoscoliosis. There are no blastic or lytic bone lesions.  Review of the MIP images confirms the above findings.  IMPRESSION: Positive for acute PE with CT evidence of right heart strain (RV/LV Ratio = 1.5) consistent with at least submassive (intermediate risk) PE. The presence of right heart strain has been associated with an increased risk of morbidity and mortality. Please activate Code PE by paging [phone].  Underlying emphysema with patchy bibasilar atelectatic change, slightly more on the left than on the right.  Enlarged left lobe of thyroid with calcified nodule in left lobe thyroid. Consider further evaluation with thyroid ultrasound. If patient is clinically hyperthyroid, consider nuclear medicine thyroid uptake and scan.  Hepatic steatosis.  No appreciable adenopathy.  Critical Value/emergent results were called by telephone at the time of interpretation on 02/06/2015 at 11:51 am to Dr. Alfonzo Beers , who verbally acknowledged these results.   Electronically Signed   By: Lowella Grip III M.D.   On: 02/06/2015 11:52   Ir Ivc Filter Plmt / S&i /img Guid/mod Sed  02/08/2015   CLINICAL DATA:  ACUTE BILATERAL PULMONARY EMBOLI WITH RIGHT HEART STRAIN. ACUTE BILATERAL DVT  EXAM: ULTRASOUND GUIDANCE FOR VASCULAR ACCESS  IVC CATHETERIZATION AND VENOGRAM  IVC FILTER INSERTION  Date:  3/29/20163/29/2016 4:53 pm  Radiologist:  M. Daryll Brod, MD  Guidance:  Ultrasound fluoroscopic  FLUOROSCOPY TIME:  2 minutes 6 seconds, 130 mGy  MEDICATIONS AND MEDICAL HISTORY: 0.5 mg Versed, 25 mcg fentanyl  ANESTHESIA/SEDATION: 15 minutes  CONTRAST:  21mL OMNIPAQUE IOHEXOL 300 MG/ML  SOLN  COMPLICATIONS: None immediate  PROCEDURE: Informed consent was obtained from the patient following explanation of the procedure, risks, benefits and alternatives. The patient understands,  agrees and consents for the procedure. All questions were addressed. A time out was performed.  Maximal barrier sterile technique utilized including caps, mask, sterile gowns, sterile gloves, large sterile drape, hand hygiene, and betadine prep.  Under sterile condition and local anesthesia, right common femoral venous access was performed with ultrasound. Over a guide wire, the IVC filter delivery sheath and inner dilator were advanced into the IVC just above the IVC bifurcation. Contrast injection was performed for an IVC venogram.  IVC VENOGRAM: The IVC is patent. No evidence of thrombus, stenosis, or occlusion. No variant venous anatomy. The renal veins are identified at L1.  IVC FILTER INSERTION: Through the delivery sheath, the Bard Denali IVC filter was deployed in the infrarenal IVC at the L2-3 level just below the renal veins and above the IVC bifurcation. Contrast injection confirmed position. There is good apposition of the filter against the IVC.  The delivery sheath was removed and hemostasis was obtained with compression for 5 minutes. The patient tolerated the procedure well. No immediate complications.  IMPRESSION: Ultrasound and fluoroscopically guided infrarenal IVC filter insertion.   Electronically Signed   By: Jerilynn Mages.  Shick M.D.   On: 02/08/2015 17:01   Dg Chest Port 1 View  02/08/2015   CLINICAL DATA:  Acute pulmonary emboli identified 2 days ago, now with worsening shortness of breath and wheezing.  EXAM: PORTABLE CHEST - 1 VIEW  COMPARISON:  Two-view chest x-ray and CTA chest 02/06/2015.  FINDINGS: Markedly suboptimal inspiration with atelectasis in the lung bases. Lungs remain clear otherwise. Cardiac silhouette moderately enlarged, unchanged. Pulmonary vascularity normal. Possible small bilateral pleural effusions.  IMPRESSION: 1. Suboptimal inspiration accounts for bibasilar atelectasis. 2. Possible small bilateral pleural effusions. 3.  No acute cardiopulmonary disease otherwise.    Electronically Signed   By: Evangeline Dakin M.D.   On: 02/08/2015 08:33         Subjective: Patient states that she is breathing better. She denies any fevers, chills, chest pain,  nausea, vomiting, diarrhea, abdominal pain, dysuria, hematuria. There is no hemoptysis. She does have some dyspnea on exertion.  Objective: Filed Vitals:   02/11/15 2009 02/12/15 0328 02/12/15 1016 02/12/15 1510  BP: 139/77 132/82 102/61 107/54  Pulse: 98 102 87 85  Temp: 97.6 F (36.4 C) 98 F (36.7 C)  97.8 F (36.6 C)  TempSrc: Oral Oral  Oral  Resp: 18 18  17   Height:      Weight:      SpO2: 94% 93%  93%    Intake/Output Summary (Last 24 hours) at 02/12/15 1528 Last data filed at 02/12/15 1014  Gross per 24 hour  Intake    240 ml  Output      0 ml  Net    240 ml   Weight change:  Exam:   General:  Pt is alert, follows commands appropriately, not in acute distress  HEENT: No icterus, No thrush,  [place]/AT  Cardiovascular: RRR, S1/S2, no rubs, no gallops  Respiratory: Bibasilar crackles. No wheezing. Good air movement.  Abdomen: Soft/+BS, non tender, non distended, no guarding  Extremities: trace LE edema, No lymphangitis, No petechiae, No rashes, no synovitis  Data Reviewed: Basic Metabolic Panel:  Recent Labs Lab 02/06/15 1010 02/07/15 0333 02/08/15 0253 02/09/15 0225 02/12/15 0451  NA 136 136 134* 134* 137  K 3.8 3.8 3.8 3.6 3.6  CL 103 104 103 99 105  CO2 23 25 25 30 29   GLUCOSE 116* 111* 104* 90 90  BUN 22 16 18 16 8   CREATININE 1.39* 1.02 1.04 1.05 0.83  CALCIUM 8.8 8.2* 8.3* 8.6 9.0   Liver Function Tests:  Recent Labs Lab 02/07/15 1112 02/08/15 0253  AST 19 28  ALT 12 14  ALKPHOS 70 74  BILITOT 0.7 0.9  PROT 6.9 6.7  ALBUMIN 2.8* 2.7*   No results for input(s): LIPASE, AMYLASE in the last 168 hours. No results for input(s): AMMONIA in the last 168 hours. CBC:  Recent Labs Lab 02/08/15 0253 02/09/15 0225 02/10/15 0252 02/11/15 0408  02/12/15 0451  WBC 7.4 8.7 8.3 7.0 6.5  HGB 9.4* 9.9* 10.5* 9.5* 10.6*  HCT 31.7* 33.6* 35.6* 32.5* 36.5  MCV 83.2 83.4 83.8 83.8 83.9  PLT 228 257 282 294 334   Cardiac Enzymes:  Recent Labs Lab 02/06/15 1010 02/06/15 1620 02/06/15 2220 02/07/15 0333  TROPONINI 0.09* 0.07* 0.05* 0.03   BNP: Invalid input(s): POCBNP CBG:  Recent Labs Lab 02/09/15 0730 02/09/15 1141 02/09/15 1639  GLUCAP 81 107* 92    Recent Results (from the past 240 hour(s))  MRSA PCR Screening     Status: None   Collection Time: 02/06/15  3:08 PM  Result Value Ref Range Status   MRSA by PCR NEGATIVE NEGATIVE Final    Comment:        The GeneXpert MRSA Assay (FDA approved for NASAL specimens only), is one component of a  comprehensive MRSA colonization surveillance program. It is not intended to diagnose MRSA infection nor to guide or monitor treatment for MRSA infections.   Clostridium Difficile by PCR     Status: None   Collection Time: 02/11/15 11:13 AM  Result Value Ref Range Status   C difficile by pcr NEGATIVE NEGATIVE Final     Scheduled Meds: . antiseptic oral rinse  7 mL Mouth Rinse BID  . buPROPion  300 mg Oral Daily  . calcium-vitamin D  1 tablet Oral Q breakfast  . clorazepate  3.75 mg Oral BID  . gabapentin  600 mg Oral BID  . iron polysaccharides  150 mg Oral Daily  . lamoTRIgine  200 mg Oral QHS  . loratadine  10 mg Oral Daily  . rOPINIRole  1 mg Oral Daily  . rOPINIRole  2 mg Oral 2 times per day  . sodium chloride  3 mL Intravenous Q12H  . warfarin  7.5 mg Oral ONCE-1800  . warfarin   Does not apply Once  . Warfarin - Pharmacist Dosing Inpatient   Does not apply q1800  . zolpidem  5 mg Oral QHS   Continuous Infusions: . sodium chloride Stopped (02/07/15 1939)  . heparin 1,400 Units/hr (02/12/15 1413)     Lihanna Biever, DO  [facility] Pager [phone]  If 7PM-7AM, please contact night-coverage [email or web address] Password [email or web address] 02/12/2015, 3:28 PM   LOS: 6  days

## 2015-02-12 NOTE — Progress Notes (Addendum)
Addendum: heparin level still elevated at 0.76 after rate adjustment this am. No bleeding issues noted. Will further reduce rate and recheck level tonight.  INR up to 1.3 after 3 doses, will increase warfarin to 7.5mg  tonight.  Plan: Reduce heparin to 1400 units/hr, recheck tonight. Warfarin 7.5mg  tonight  Erin Hearing PharmD., BCPS Clinical Pharmacist Pager [phone] 02/12/2015 2:11 PM    ANTICOAGULATION CONSULT NOTE - Follow Up Consult  Pharmacy Consult for Heparin  Indication: pulmonary embolus  No Known Allergies  Patient Measurements: Height: 5' (152.4 cm) Weight: 172 lb 2.9 oz (78.1 kg) IBW/kg (Calculated) : 45.5  Vital Signs: Temp: 98 F (36.7 C) (04/02 0328) Temp Source: Oral (04/02 0328) BP: 132/82 mmHg (04/02 0328) Pulse Rate: 102 (04/02 0328)  Labs:  Recent Labs  02/10/15 0252  02/10/15 1900 02/11/15 0408 02/12/15 0451  HGB 10.5*  --   --  9.5* 10.6*  HCT 35.6*  --   --  32.5* 36.5  PLT 282  --   --  294 334  LABPROT 15.3*  --   --  15.9* 16.4*  INR 1.19  --   --  1.26 1.31  HEPARINUNFRC 0.18*  < > 0.51 0.55 0.76*  CREATININE  --   --   --   --  0.83  < > = values in this interval not displayed.  Estimated Creatinine Clearance: 56.6 mL/min (by C-G formula based on Cr of 0.83).   Assessment: Supra-therapeutic heparin level, no issues per RN.   Goal of Therapy:  Heparin level 0.3-0.7 units/ml Monitor platelets by anticoagulation protocol: Yes   Plan:  -Decrease heparin to 1550 units/hr -1300 HL -Daily CBC/HL -Monitor for bleeding  Narda Bonds 02/12/2015,6:11 AM

## 2015-02-13 LAB — CBC
HCT: 36.3 % (ref 36.0–46.0)
HEMOGLOBIN: 10.3 g/dL — AB (ref 12.0–15.0)
MCH: 23.8 pg — AB (ref 26.0–34.0)
MCHC: 28.4 g/dL — ABNORMAL LOW (ref 30.0–36.0)
MCV: 83.8 fL (ref 78.0–100.0)
Platelets: 336 10*3/uL (ref 150–400)
RBC: 4.33 MIL/uL (ref 3.87–5.11)
RDW: 18.2 % — ABNORMAL HIGH (ref 11.5–15.5)
WBC: 5.6 10*3/uL (ref 4.0–10.5)

## 2015-02-13 LAB — PROTIME-INR
INR: 1.59 — ABNORMAL HIGH (ref 0.00–1.49)
PROTHROMBIN TIME: 19.1 s — AB (ref 11.6–15.2)

## 2015-02-13 LAB — HEPARIN LEVEL (UNFRACTIONATED): Heparin Unfractionated: 0.41 IU/mL (ref 0.30–0.70)

## 2015-02-13 MED ORDER — WARFARIN SODIUM 7.5 MG PO TABS
7.5000 mg | ORAL_TABLET | Freq: Once | ORAL | Status: AC
  Administered 2015-02-13: 7.5 mg via ORAL
  Filled 2015-02-13: qty 1

## 2015-02-13 NOTE — Progress Notes (Signed)
ANTICOAGULATION CONSULT NOTE - Follow Up Consult  Pharmacy Consult for Heparin/warfarin Indication: PE  No Known Allergies  Patient Measurements: Height: 5' (152.4 cm) Weight: 172 lb 2.9 oz (78.1 kg) IBW/kg (Calculated) : 45.5 Heparin Dosing Weight: 64 kg  Vital Signs: Temp: 97.9 F (36.6 C) (04/03 0520) Temp Source: Oral (04/03 0520) BP: 131/71 mmHg (04/03 0918) Pulse Rate: 75 (04/03 0918)  Labs:  Recent Labs  02/11/15 0408 02/12/15 0451 02/12/15 1300 02/12/15 2107 02/13/15 0515  HGB 9.5* 10.6*  --   --  10.3*  HCT 32.5* 36.5  --   --  36.3  PLT 294 334  --   --  336  LABPROT 15.9* 16.4*  --   --  19.1*  INR 1.26 1.31  --   --  1.59*  HEPARINUNFRC 0.55 0.76* 0.76* 0.45 0.41  CREATININE  --  0.83  --   --   --     Estimated Creatinine Clearance: 56.6 mL/min (by C-G formula based on Cr of 0.83).   Assessment: Anticoagulation - new bilateral PE with right heart strain (per CT) and doppler 3/28 shows bilateral LLE DVT. Heparin begun at [facility].   Heparin level 0.76>0.45>0.41 continues in goal range. INR below goal but trending up now to 1.59. Repeat 7.5mg  tonight. 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  Goal of Therapy:  Heparin level 0.3-0.7 units/ml Monitor platelets by anticoagulation protocol: Yes   Plan:  Continue IV heparin at 1400 units/hr Warfarin 7.5mg  tonight Daily HL and CBC   Erin Hearing PharmD., BCPS Clinical Pharmacist Pager [phone] 02/13/2015 10:49 AM

## 2015-02-13 NOTE — Progress Notes (Signed)
PROGRESS NOTE  [name] May ACZ:[id number] DOB: 1941-04-01 DOA: 02/06/2015 PCP: Wonda Cerise, MD   Brief History 74 year old female with PMH of a PE in her 81s treated with a course of coumadin. She has been becoming more tired lately and has been leading more of a sedentary life style. She denies recent trips or plan/car rides or family history of clots. She does endorse however that her right leg has been more swollen for which she has been on lasix for that. She reports sudden SOB starting 2 days prior to admission but it has been getting worse. She presented to [facility] ED. A CT was done that showed bilateral PE R>L. Assessment/Plan: Acute hypoxic respiratory failure secondary to submassive PE -Initially Admitted to stepdown On IV heparin drip.  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 -Appreciate pulmonary consult and recommendation.  -started on Coumadin  -Right heart strain commended on CT scan was not evident on 2-D echo. -She will need to be on lifelong Coumadin. -Factor V Leiden positive  -Continue heparin bridge   Elevated troponin -secondary to acute PE. EKG unremarkable. Echo without wall motion abnormality. -No chest pain presently   Normocytic/ iron deficiency anemia -Patient transfusion 2 units PRBC with good improvement.  -Reports following up with hematologist in [facility]. -given IV dose nulecit -Hgb remains stable after transfusion  Acute kidney injury on admission -Mild. Resolved -Serum creatinine 1.39 on 02/06/2015 -Repeat BMP in the morning   Chronic back pain Underlying scoliosis. Pain control with when necessary Vicodin  GERD Continue PPI  Diarrhea  -C. difficile PCR--neg   Family Communication: Daughter updated at beside 02/12/25 Disposition Plan: Home 4/4 or 4/5      Procedures/Studies: Dg Chest 2 View  02/06/2015   CLINICAL DATA:  Shortness of  Breath  EXAM: CHEST  2 VIEW  COMPARISON:  None.  FINDINGS: Cardiomediastinal silhouette is unremarkable. Significant dextroscoliosis lower thoracic spine. Levoscoliosis upper thoracic spine. Small hiatal hernia. No acute infiltrate or pulmonary edema. There is small focal herniation of right posterior diaphragm best seen on lateral view. Thoracic spine osteopenia.  IMPRESSION: Thoracic scoliosis.  No active disease.  Small hiatal hernia.   Electronically Signed   By: Lahoma Crocker M.D.   On: 02/06/2015 10:30   Ct Angio Chest Pe W/cm &/or Wo Cm  02/06/2015   CLINICAL DATA:  Two day history of chest pain and difficulty breathing  EXAM: CT ANGIOGRAPHY CHEST WITH CONTRAST  TECHNIQUE: Multidetector CT imaging of the chest was performed using the standard protocol during bolus administration of intravenous contrast. Multiplanar CT image reconstructions and MIPs were obtained to evaluate the vascular anatomy.  CONTRAST:  76mL OMNIPAQUE IOHEXOL 350 MG/ML SOLN  COMPARISON:  Chest radiograph February 06, 2015  FINDINGS: There is extensive pulmonary embolism arising from the right main pulmonary artery extending into multiple right lower lobe branches as well as into the anterior segment right upper lobe branch. There are pulmonary emboli in the distal left main pulmonary artery with pulmonary emboli extending into the proximal upper and lower lobe pulmonary arteries. There is right heart strain with the right ventricle to left ventricle diameter ratio measuring 1.5, normal less than 0.9.  There is no thoracic aortic aneurysm or dissection.  There is a moderate hiatal hernia.  There is underlying centrilobular emphysematous change. There is patchy atelectasis in both lung bases, slightly more on the left than on  the right.  There is no appreciable thoracic adenopathy. There are multiple foci of coronary artery calcification. The pericardium is not thickened. There is enlargement of the left lobe of the thyroid with a focal  calcified nodular area in the left lobe measuring 1.1 x 0.9 cm.  In the visualized upper abdomen, there is hepatic steatosis.  There is degenerative change in the thoracic spine with upper thoracic levoscoliosis. There are no blastic or lytic bone lesions.  Review of the MIP images confirms the above findings.  IMPRESSION: Positive for acute PE with CT evidence of right heart strain (RV/LV Ratio = 1.5) consistent with at least submassive (intermediate risk) PE. The presence of right heart strain has been associated with an increased risk of morbidity and mortality. Please activate Code PE by paging [phone].  Underlying emphysema with patchy bibasilar atelectatic change, slightly more on the left than on the right.  Enlarged left lobe of thyroid with calcified nodule in left lobe thyroid. Consider further evaluation with thyroid ultrasound. If patient is clinically hyperthyroid, consider nuclear medicine thyroid uptake and scan.  Hepatic steatosis.  No appreciable adenopathy.  Critical Value/emergent results were called by telephone at the time of interpretation on 02/06/2015 at 11:51 am to Dr. Alfonzo Beers , who verbally acknowledged these results.   Electronically Signed   By: Lowella Grip III M.D.   On: 02/06/2015 11:52   Ir Ivc Filter Plmt / S&i /img Guid/mod Sed  02/08/2015   CLINICAL DATA:  ACUTE BILATERAL PULMONARY EMBOLI WITH RIGHT HEART STRAIN. ACUTE BILATERAL DVT  EXAM: ULTRASOUND GUIDANCE FOR VASCULAR ACCESS  IVC CATHETERIZATION AND VENOGRAM  IVC FILTER INSERTION  Date:  3/29/20163/29/2016 4:53 pm  Radiologist:  M. Daryll Brod, MD  Guidance:  Ultrasound fluoroscopic  FLUOROSCOPY TIME:  2 minutes 6 seconds, 130 mGy  MEDICATIONS AND MEDICAL HISTORY: 0.5 mg Versed, 25 mcg fentanyl  ANESTHESIA/SEDATION: 15 minutes  CONTRAST:  62mL OMNIPAQUE IOHEXOL 300 MG/ML  SOLN  COMPLICATIONS: None immediate  PROCEDURE: Informed consent was obtained from the patient following explanation of the procedure,  risks, benefits and alternatives. The patient understands, agrees and consents for the procedure. All questions were addressed. A time out was performed.  Maximal barrier sterile technique utilized including caps, mask, sterile gowns, sterile gloves, large sterile drape, hand hygiene, and betadine prep.  Under sterile condition and local anesthesia, right common femoral venous access was performed with ultrasound. Over a guide wire, the IVC filter delivery sheath and inner dilator were advanced into the IVC just above the IVC bifurcation. Contrast injection was performed for an IVC venogram.  IVC VENOGRAM: The IVC is patent. No evidence of thrombus, stenosis, or occlusion. No variant venous anatomy. The renal veins are identified at L1.  IVC FILTER INSERTION: Through the delivery sheath, the Bard Denali IVC filter was deployed in the infrarenal IVC at the L2-3 level just below the renal veins and above the IVC bifurcation. Contrast injection confirmed position. There is good apposition of the filter against the IVC.  The delivery sheath was removed and hemostasis was obtained with compression for 5 minutes. The patient tolerated the procedure well. No immediate complications.  IMPRESSION: Ultrasound and fluoroscopically guided infrarenal IVC filter insertion.   Electronically Signed   By: Jerilynn Mages.  Shick M.D.   On: 02/08/2015 17:01   Dg Chest Port 1 View  02/08/2015   CLINICAL DATA:  Acute pulmonary emboli identified 2 days ago, now with worsening shortness of breath and wheezing.  EXAM: PORTABLE CHEST -  1 VIEW  COMPARISON:  Two-view chest x-ray and CTA chest 02/06/2015.  FINDINGS: Markedly suboptimal inspiration with atelectasis in the lung bases. Lungs remain clear otherwise. Cardiac silhouette moderately enlarged, unchanged. Pulmonary vascularity normal. Possible small bilateral pleural effusions.  IMPRESSION: 1. Suboptimal inspiration accounts for bibasilar atelectasis. 2. Possible small bilateral pleural  effusions. 3.  No acute cardiopulmonary disease otherwise.   Electronically Signed   By: Evangeline Dakin M.D.   On: 02/08/2015 08:33         Subjective: Patient denies fevers, chills, headache, chest pain, dyspnea, nausea, vomiting, diarrhea, abdominal pain, dysuria, hematuria Still has some dyspnea on exertion, but gradually improving  Objective: Filed Vitals:   02/12/15 1510 02/12/15 2002 02/13/15 0520 02/13/15 0918  BP: 107/54 135/95 135/66 131/71  Pulse: 85 96 87 75  Temp: 97.8 F (36.6 C) 97.5 F (36.4 C) 97.9 F (36.6 C)   TempSrc: Oral Oral Oral   Resp: 17 18 16    Height:      Weight:      SpO2: 93% 99% 99%     Intake/Output Summary (Last 24 hours) at 02/13/15 1343 Last data filed at 02/13/15 0900  Gross per 24 hour  Intake    627 ml  Output      0 ml  Net    627 ml   Weight change:  Exam:   General:  Pt is alert, follows commands appropriately, not in acute distress  HEENT: No icterus, No thrush, [place]/AT  Cardiovascular: RRR, S1/S2, no rubs, no gallops  Respiratory: Bibasilar crackles, left greater than right. No wheezing  Abdomen: Soft/+BS, non tender, non distended, no guarding  Extremities: No edema, No lymphangitis, No petechiae, No rashes, no synovitis  Data Reviewed: Basic Metabolic Panel:  Recent Labs Lab 02/07/15 0333 02/08/15 0253 02/09/15 0225 02/12/15 0451  NA 136 134* 134* 137  K 3.8 3.8 3.6 3.6  CL 104 103 99 105  CO2 25 25 30 29   GLUCOSE 111* 104* 90 90  BUN 16 18 16 8   CREATININE 1.02 1.04 1.05 0.83  CALCIUM 8.2* 8.3* 8.6 9.0   Liver Function Tests:  Recent Labs Lab 02/07/15 1112 02/08/15 0253  AST 19 28  ALT 12 14  ALKPHOS 70 74  BILITOT 0.7 0.9  PROT 6.9 6.7  ALBUMIN 2.8* 2.7*   No results for input(s): LIPASE, AMYLASE in the last 168 hours. No results for input(s): AMMONIA in the last 168 hours. CBC:  Recent Labs Lab 02/09/15 0225 02/10/15 0252 02/11/15 0408 02/12/15 0451 02/13/15 0515  WBC 8.7 8.3  7.0 6.5 5.6  HGB 9.9* 10.5* 9.5* 10.6* 10.3*  HCT 33.6* 35.6* 32.5* 36.5 36.3  MCV 83.4 83.8 83.8 83.9 83.8  PLT 257 282 294 334 336   Cardiac Enzymes:  Recent Labs Lab 02/06/15 1620 02/06/15 2220 02/07/15 0333  TROPONINI 0.07* 0.05* 0.03   BNP: Invalid input(s): POCBNP CBG:  Recent Labs Lab 02/09/15 0730 02/09/15 1141 02/09/15 1639  GLUCAP 81 107* 92    Recent Results (from the past 240 hour(s))  MRSA PCR Screening     Status: None   Collection Time: 02/06/15  3:08 PM  Result Value Ref Range Status   MRSA by PCR NEGATIVE NEGATIVE Final    Comment:        The GeneXpert MRSA Assay (FDA approved for NASAL specimens only), is one component of a comprehensive MRSA colonization surveillance program. It is not intended to diagnose MRSA infection nor to guide or monitor treatment for  MRSA infections.   Clostridium Difficile by PCR     Status: None   Collection Time: 02/11/15 11:13 AM  Result Value Ref Range Status   C difficile by pcr NEGATIVE NEGATIVE Final     Scheduled Meds: . antiseptic oral rinse  7 mL Mouth Rinse BID  . buPROPion  300 mg Oral Daily  . calcium-vitamin D  1 tablet Oral Q breakfast  . clorazepate  3.75 mg Oral BID  . gabapentin  600 mg Oral BID  . iron polysaccharides  150 mg Oral Daily  . lamoTRIgine  200 mg Oral QHS  . loratadine  10 mg Oral Daily  . rOPINIRole  1 mg Oral Daily  . rOPINIRole  2 mg Oral 2 times per day  . sodium chloride  3 mL Intravenous Q12H  . warfarin  7.5 mg Oral ONCE-1800  . warfarin   Does not apply Once  . Warfarin - Pharmacist Dosing Inpatient   Does not apply q1800  . zolpidem  5 mg Oral QHS   Continuous Infusions: . sodium chloride Stopped (02/07/15 1939)  . heparin 1,400 Units/hr (02/13/15 0612)     Emrey Thornley, DO  [facility] Pager [phone]  If 7PM-7AM, please contact night-coverage [email or web address] Password [email or web address] 02/13/2015, 1:43 PM   LOS: 7 days

## 2015-02-14 LAB — HEPARIN LEVEL (UNFRACTIONATED): Heparin Unfractionated: 0.38 IU/mL (ref 0.30–0.70)

## 2015-02-14 LAB — PROTIME-INR
INR: 1.91 — AB (ref 0.00–1.49)
Prothrombin Time: 22 seconds — ABNORMAL HIGH (ref 11.6–15.2)

## 2015-02-14 MED ORDER — WARFARIN SODIUM 7.5 MG PO TABS
7.5000 mg | ORAL_TABLET | Freq: Once | ORAL | Status: AC
Start: 2015-02-14 — End: 2015-02-14
  Administered 2015-02-14: 7.5 mg via ORAL
  Filled 2015-02-14: qty 1

## 2015-02-14 MED ORDER — HYDROCODONE-ACETAMINOPHEN 5-325 MG PO TABS: 1.0000 | ORAL_TABLET | Freq: Four times a day (QID) | ORAL | Status: DC | PRN

## 2015-02-14 NOTE — Progress Notes (Signed)
CARE MANAGEMENT NOTE 02/14/2015  Patient:  [name],[name]   Account Number:  [id number]  Date Initiated:  02/07/2015  Documentation initiated by:  Elissa Hefty  Subjective/Objective Assessment:   adm w pul embolus     Action/Plan:   lives alone, pcp dr Jefm Petty   Anticipated DC Date:  02/15/2015   Anticipated DC Plan:  [place]  CM consult      Choice offered to / List presented to:             Status of service:  Completed, signed off Medicare Important Message given?  YES (If response is "NO", the following Medicare IM given date fields will be blank) Date Medicare IM given:  02/09/2015 Medicare IM given by:  Elissa Hefty Date Additional Medicare IM given:  02/14/2015 Additional Medicare IM given by:  Jonnie Finner  Discharge Disposition:  HOME/SELF CARE  Per UR Regulation:  Reviewed for med. necessity/level of care/duration of stay  If discussed at [place] of Stay Meetings, dates discussed:    Comments:  02/14/2015 1100 NCM spoke to pt and gave pemission to speak to dtr, [name] [name] # [id number], address, [address], [place] [address].  Dtr states pt will be staying with her temp until she is stronger. Dtr states pt see [facility], Dr Jeralene Huff and they use the [facility] for PT/INR draws for Coumadin [phone] fax [phone]. Contacted Clinic and states a referral form in  required. Provided NCM fax number. Pt will need oxygen at time of dc. She has RW at home. No other DME requested at this time. Notified attending, Dr. Carles Collet. Possible dc on 02/15/2015. Will need PT/INR drawn on Friday, April 8. Jonnie Finner RN CCM Case Mgmt phone [phone]

## 2015-02-14 NOTE — Progress Notes (Signed)
PROGRESS NOTE  [name] May PFX:[id number] DOB: 02-28-1941 DOA: 02/06/2015 PCP: Wonda Cerise, MD  Brief History 74 year old female with PMH of a PE in her 32s treated with a course of coumadin. She has been becoming more tired lately and has been leading more of a sedentary life style. She denies recent trips or plan/car rides or family history of clots. She does endorse however that her right leg has been more swollen for which she has been on lasix for that. She reports sudden SOB starting 2 days prior to admission but it has been getting worse. She presented to [facility] ED. A CT was done that showed bilateral PE R>L. Assessment/Plan: Acute hypoxic respiratory failure secondary to submassive PE -Initially Admitted to stepdown On IV heparin drip.  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 -Appreciate pulmonary consult and recommendation.  -started on Coumadin  -Right heart strain commented on CT scan was not evident on 2-D echo. -She will need to be on lifelong Coumadin. -Factor V Leiden positive  -Continued heparin bridge until INR was therapeutic  Elevated troponin -secondary to acute PE. EKG unremarkable. Echo without wall motion abnormality. -No chest pain presently   Normocytic/ iron deficiency anemia -Patient transfusion 2 units PRBC with good improvement.  -Reports following up with hematologist in [facility]. -given IV dose nulecit -Hgb remains stable after transfusion  Acute kidney injury on admission -Mild. Resolved -Serum creatinine 1.39 on 02/06/2015  Chronic back pain Underlying scoliosis. Pain control with when necessary Vicodin  GERD Continue PPI  Diarrhea  -C. difficile PCR--neg  Restless Leg -pt takes Requip 1mg  in am -then 2mg  @6pm  and 2mg  @ 9pm  Family Communication: Daughter updated at beside 02/12/25 Disposition Plan: Home 4/5 if  stable    Procedures/Studies: Dg Chest 2 View  02/06/2015   CLINICAL DATA:  Shortness of Breath  EXAM: CHEST  2 VIEW  COMPARISON:  None.  FINDINGS: Cardiomediastinal silhouette is unremarkable. Significant dextroscoliosis lower thoracic spine. Levoscoliosis upper thoracic spine. Small hiatal hernia. No acute infiltrate or pulmonary edema. There is small focal herniation of right posterior diaphragm best seen on lateral view. Thoracic spine osteopenia.  IMPRESSION: Thoracic scoliosis.  No active disease.  Small hiatal hernia.   Electronically Signed   By: Lahoma Crocker M.D.   On: 02/06/2015 10:30   Ct Angio Chest Pe W/cm &/or Wo Cm  02/06/2015   CLINICAL DATA:  Two day history of chest pain and difficulty breathing  EXAM: CT ANGIOGRAPHY CHEST WITH CONTRAST  TECHNIQUE: Multidetector CT imaging of the chest was performed using the standard protocol during bolus administration of intravenous contrast. Multiplanar CT image reconstructions and MIPs were obtained to evaluate the vascular anatomy.  CONTRAST:  14mL OMNIPAQUE IOHEXOL 350 MG/ML SOLN  COMPARISON:  Chest radiograph February 06, 2015  FINDINGS: There is extensive pulmonary embolism arising from the right main pulmonary artery extending into multiple right lower lobe branches as well as into the anterior segment right upper lobe branch. There are pulmonary emboli in the distal left main pulmonary artery with pulmonary emboli extending into the proximal upper and lower lobe pulmonary arteries. There is right heart strain with the right ventricle to left ventricle diameter ratio measuring 1.5, normal less than 0.9.  There is no thoracic aortic aneurysm or dissection.  There is a moderate hiatal hernia.  There is underlying centrilobular emphysematous change. There is patchy atelectasis in both  lung bases, slightly more on the left than on the right.  There is no appreciable thoracic adenopathy. There are multiple foci of coronary artery calcification. The  pericardium is not thickened. There is enlargement of the left lobe of the thyroid with a focal calcified nodular area in the left lobe measuring 1.1 x 0.9 cm.  In the visualized upper abdomen, there is hepatic steatosis.  There is degenerative change in the thoracic spine with upper thoracic levoscoliosis. There are no blastic or lytic bone lesions.  Review of the MIP images confirms the above findings.  IMPRESSION: Positive for acute PE with CT evidence of right heart strain (RV/LV Ratio = 1.5) consistent with at least submassive (intermediate risk) PE. The presence of right heart strain has been associated with an increased risk of morbidity and mortality. Please activate Code PE by paging [phone].  Underlying emphysema with patchy bibasilar atelectatic change, slightly more on the left than on the right.  Enlarged left lobe of thyroid with calcified nodule in left lobe thyroid. Consider further evaluation with thyroid ultrasound. If patient is clinically hyperthyroid, consider nuclear medicine thyroid uptake and scan.  Hepatic steatosis.  No appreciable adenopathy.  Critical Value/emergent results were called by telephone at the time of interpretation on 02/06/2015 at 11:51 am to Dr. Alfonzo Beers , who verbally acknowledged these results.   Electronically Signed   By: Lowella Grip III M.D.   On: 02/06/2015 11:52   Ir Ivc Filter Plmt / S&i /img Guid/mod Sed  02/08/2015   CLINICAL DATA:  ACUTE BILATERAL PULMONARY EMBOLI WITH RIGHT HEART STRAIN. ACUTE BILATERAL DVT  EXAM: ULTRASOUND GUIDANCE FOR VASCULAR ACCESS  IVC CATHETERIZATION AND VENOGRAM  IVC FILTER INSERTION  Date:  3/29/20163/29/2016 4:53 pm  Radiologist:  M. Daryll Brod, MD  Guidance:  Ultrasound fluoroscopic  FLUOROSCOPY TIME:  2 minutes 6 seconds, 130 mGy  MEDICATIONS AND MEDICAL HISTORY: 0.5 mg Versed, 25 mcg fentanyl  ANESTHESIA/SEDATION: 15 minutes  CONTRAST:  63mL OMNIPAQUE IOHEXOL 300 MG/ML  SOLN  COMPLICATIONS: None immediate   PROCEDURE: Informed consent was obtained from the patient following explanation of the procedure, risks, benefits and alternatives. The patient understands, agrees and consents for the procedure. All questions were addressed. A time out was performed.  Maximal barrier sterile technique utilized including caps, mask, sterile gowns, sterile gloves, large sterile drape, hand hygiene, and betadine prep.  Under sterile condition and local anesthesia, right common femoral venous access was performed with ultrasound. Over a guide wire, the IVC filter delivery sheath and inner dilator were advanced into the IVC just above the IVC bifurcation. Contrast injection was performed for an IVC venogram.  IVC VENOGRAM: The IVC is patent. No evidence of thrombus, stenosis, or occlusion. No variant venous anatomy. The renal veins are identified at L1.  IVC FILTER INSERTION: Through the delivery sheath, the Bard Denali IVC filter was deployed in the infrarenal IVC at the L2-3 level just below the renal veins and above the IVC bifurcation. Contrast injection confirmed position. There is good apposition of the filter against the IVC.  The delivery sheath was removed and hemostasis was obtained with compression for 5 minutes. The patient tolerated the procedure well. No immediate complications.  IMPRESSION: Ultrasound and fluoroscopically guided infrarenal IVC filter insertion.   Electronically Signed   By: Jerilynn Mages.  Shick M.D.   On: 02/08/2015 17:01   Dg Chest Port 1 View  02/08/2015   CLINICAL DATA:  Acute pulmonary emboli identified 2 days ago, now with worsening shortness  of breath and wheezing.  EXAM: PORTABLE CHEST - 1 VIEW  COMPARISON:  Two-view chest x-ray and CTA chest 02/06/2015.  FINDINGS: Markedly suboptimal inspiration with atelectasis in the lung bases. Lungs remain clear otherwise. Cardiac silhouette moderately enlarged, unchanged. Pulmonary vascularity normal. Possible small bilateral pleural effusions.  IMPRESSION: 1.  Suboptimal inspiration accounts for bibasilar atelectasis. 2. Possible small bilateral pleural effusions. 3.  No acute cardiopulmonary disease otherwise.   Electronically Signed   By: Evangeline Dakin M.D.   On: 02/08/2015 08:33         Subjective: Patient is feeling well overall. She still has some dyspnea on exertion but denies fevers, chills, chest pain, nausea, vomiting, diarrhea, abdominal pain, hematochezia, hemoptysis.  Objective: Filed Vitals:   02/13/15 1730 02/13/15 1927 02/14/15 0405 02/14/15 1347  BP: 128/97 138/75 113/67 115/62  Pulse: 91 89 75 84  Temp: 97.8 F (36.6 C) 98 F (36.7 C) 97.7 F (36.5 C) 98.2 F (36.8 C)  TempSrc: Oral Oral Oral Oral  Resp: 18 16 18 18   Height:      Weight:      SpO2: 96% 96% 93% 92%    Intake/Output Summary (Last 24 hours) at 02/14/15 1833 Last data filed at 02/14/15 1300  Gross per 24 hour  Intake    480 ml  Output      0 ml  Net    480 ml   Weight change:  Exam:   General:  Pt is alert, follows commands appropriately, not in acute distress  HEENT: No icterus, No thrush,  [place]/AT  Cardiovascular: RRR, S1/S2, no rubs, no gallops  Respiratory: Bibasilar rales. No wheezing. Good air movement.  Abdomen: Soft/+BS, non tender, non distended, no guarding  Extremities: trace LE edema, No lymphangitis, No petechiae, No rashes, no synovitis  Data Reviewed: Basic Metabolic Panel:  Recent Labs Lab 02/08/15 0253 02/09/15 0225 02/12/15 0451  NA 134* 134* 137  K 3.8 3.6 3.6  CL 103 99 105  CO2 25 30 29   GLUCOSE 104* 90 90  BUN 18 16 8   CREATININE 1.04 1.05 0.83  CALCIUM 8.3* 8.6 9.0   Liver Function Tests:  Recent Labs Lab 02/08/15 0253  AST 28  ALT 14  ALKPHOS 74  BILITOT 0.9  PROT 6.7  ALBUMIN 2.7*   No results for input(s): LIPASE, AMYLASE in the last 168 hours. No results for input(s): AMMONIA in the last 168 hours. CBC:  Recent Labs Lab 02/09/15 0225 02/10/15 0252 02/11/15 0408 02/12/15 0451  02/13/15 0515  WBC 8.7 8.3 7.0 6.5 5.6  HGB 9.9* 10.5* 9.5* 10.6* 10.3*  HCT 33.6* 35.6* 32.5* 36.5 36.3  MCV 83.4 83.8 83.8 83.9 83.8  PLT 257 282 294 334 336   Cardiac Enzymes: No results for input(s): CKTOTAL, CKMB, CKMBINDEX, TROPONINI in the last 168 hours. BNP: Invalid input(s): POCBNP CBG:  Recent Labs Lab 02/09/15 0730 02/09/15 1141 02/09/15 1639  GLUCAP 81 107* 92    Recent Results (from the past 240 hour(s))  MRSA PCR Screening     Status: None   Collection Time: 02/06/15  3:08 PM  Result Value Ref Range Status   MRSA by PCR NEGATIVE NEGATIVE Final    Comment:        The GeneXpert MRSA Assay (FDA approved for NASAL specimens only), is one component of a comprehensive MRSA colonization surveillance program. It is not intended to diagnose MRSA infection nor to guide or monitor treatment for MRSA infections.   Clostridium Difficile by PCR  Status: None   Collection Time: 02/11/15 11:13 AM  Result Value Ref Range Status   C difficile by pcr NEGATIVE NEGATIVE Final     Scheduled Meds: . antiseptic oral rinse  7 mL Mouth Rinse BID  . buPROPion  300 mg Oral Daily  . calcium-vitamin D  1 tablet Oral Q breakfast  . clorazepate  3.75 mg Oral BID  . gabapentin  600 mg Oral BID  . iron polysaccharides  150 mg Oral Daily  . lamoTRIgine  200 mg Oral QHS  . loratadine  10 mg Oral Daily  . rOPINIRole  1 mg Oral Daily  . rOPINIRole  2 mg Oral 2 times per day  . sodium chloride  3 mL Intravenous Q12H  . warfarin   Does not apply Once  . Warfarin - Pharmacist Dosing Inpatient   Does not apply q1800  . zolpidem  5 mg Oral QHS   Continuous Infusions: . sodium chloride Stopped (02/07/15 1939)  . heparin 1,400 Units/hr (02/14/15 1541)     Arrian Manson, DO  [facility] Pager [phone]  If 7PM-7AM, please contact night-coverage [email or web address] Password [facility] 02/14/2015, 6:33 PM   LOS: 8 days

## 2015-02-14 NOTE — Progress Notes (Signed)
ANTICOAGULATION CONSULT NOTE - Follow Up Consult  Pharmacy Consult for Heparin/warfarin Indication: PE  No Known Allergies  Patient Measurements: Height: 5' (152.4 cm) Weight: 172 lb 2.9 oz (78.1 kg) IBW/kg (Calculated) : 45.5 Heparin Dosing Weight: 64 kg  Vital Signs: Temp: 97.7 F (36.5 C) (04/04 0405) Temp Source: Oral (04/04 0405) BP: 113/67 mmHg (04/04 0405) Pulse Rate: 75 (04/04 0405)  Labs:  Recent Labs  02/12/15 0451  02/12/15 2107 02/13/15 0515 02/14/15 0431  HGB 10.6*  --   --  10.3*  --   HCT 36.5  --   --  36.3  --   PLT 334  --   --  336  --   LABPROT 16.4*  --   --  19.1* 22.0*  INR 1.31  --   --  1.59* 1.91*  HEPARINUNFRC 0.76*  < > 0.45 0.41 0.38  CREATININE 0.83  --   --   --   --   < > = values in this interval not displayed.  Estimated Creatinine Clearance: 56.6 mL/min (by C-G formula based on Cr of 0.83).   Assessment: Anticoagulation - new bilateral PE with right heart strain (per CT) and doppler 3/28 shows bilateral LLE DVT. Heparin started at [facility].   Heparin level continues in goal range. INR below goal but trending up now to 1.91. Repeat 7.5mg  tonight. CBC stable  Pt does have recent hx of DVTs and IVC filter placed 02/08/15. FVL + Anticipate lifelong coumadin.  Goal of Therapy:  Heparin level 0.3-0.7 units/ml Monitor platelets by anticoagulation protocol: Yes   Plan:  Continue IV heparin at 1400 units/hr Warfarin 7.5mg  tonight Daily heparin level and CBC   Alan Drummer, Pharm.D., BCPS Clinical Pharmacist Pager [phone] 02/14/2015 8:56 AM

## 2015-02-14 NOTE — Progress Notes (Signed)
Physical Therapy Treatment Patient Details Name: [name] May MRN: [id number] DOB: June 15, 1941 Today's Date: 02/14/2015    History of Present Illness Pt adm with bil PE. Bil DVT's found. Pt had IVC filter placed. PMH - scoliosis,    PT Comments    Pt progressing with mobility but continues to desat with activity. Recommend oxygen for home as well as HHPT to progress mobility, strengthen, and educate in proper positioning and exercises for scoliosis. PT will continue to follow.   Follow Up Recommendations  Home health PT;Supervision - Intermittent     Equipment Recommendations  None recommended by PT    Recommendations for Other Services OT consult     Precautions / Restrictions Precautions Precautions: Fall Precaution Comments: Decr SaO2 Restrictions Weight Bearing Restrictions: No    Mobility  Bed Mobility               General bed mobility comments: pt received in chair  Transfers Overall transfer level: Modified independent Equipment used: None Transfers: Sit to/from Stand Sit to Stand: Modified independent (Device/Increase time)         General transfer comment: practiced sit to stand multiple times from chair and toilet, mod I though pt desat to 75% after toilet transfer when on RA, 3L O2 applied after  Ambulation/Gait Ambulation/Gait assistance: Supervision Ambulation Distance (Feet): 200 Feet Assistive device: Straight cane Gait Pattern/deviations: Step-through pattern;Antalgic Gait velocity: decreased Gait velocity interpretation: Below normal speed for age/gender General Gait Details: pt with antalgic pattern due to scoliosis. SPC elevated more than normal but pt uses the height to brace self, as compensation for curvature so did not lower, pt safe with it at current height. On 2 L O2, pt desat to 80% on 2L, O2 increased to 3L. Second 100', pt slower and reaching for rail on left side. O2 sats 86%-88%.     Stairs            Wheelchair  Mobility    Modified Rankin (Stroke Patients Only)       Balance Overall balance assessment: Needs assistance Sitting-balance support: No upper extremity supported;Feet supported Sitting balance-Leahy Scale: Normal     Standing balance support: No upper extremity supported;During functional activity Standing balance-Leahy Scale: Good                      Cognition Arousal/Alertness: Awake/alert Behavior During Therapy: WFL for tasks assessed/performed Overall Cognitive Status: Within Functional Limits for tasks assessed                      Exercises      General Comments General comments (skin integrity, edema, etc.): discussed d/c considerations including activity level upon return home, going home to take shower (because shower at daughter's house is on 2nd level), stretches and strengthening activities for scoliosis, and f/u with HHPT.      Pertinent Vitals/Pain Pain Assessment: No/denies pain  O2 sats 75% on RA with exertion, see Note for more values.    Home Living                      Prior Function            PT Goals (current goals can now be found in the care plan section) Acute Rehab PT Goals Patient Stated Goal: Return home PT Goal Formulation: With patient Time For Goal Achievement: 02/16/15 Potential to Achieve Goals: Good Progress towards PT goals: Progressing toward goals  Frequency  Min 3X/week    PT Plan Current plan remains appropriate    Co-evaluation             End of Session Equipment Utilized During Treatment: Oxygen Activity Tolerance: Patient tolerated treatment well Patient left: in chair;with call bell/phone within reach;with family/visitor present     Time: 6015-6153 PT Time Calculation (min) (ACUTE ONLY): 46 min  Charges:  $Gait Training: 8-22 mins $Therapeutic Activity: 8-22 mins $Self Care/Home Management: 8-22                    G Codes:     Leighton Roach, PT  Acute Rehab  Services  [phone]  Leighton Roach 02/14/2015, 11:54 AM

## 2015-02-14 NOTE — Progress Notes (Signed)
CARE MANAGEMENT NOTE 02/14/2015  Patient:  [name] May,[name] May   Account Number:  [id number]  Date Initiated:  02/07/2015  Documentation initiated by:  Elissa Hefty  Subjective/Objective Assessment:   adm w pul embolus     Action/Plan:   lives alone, pcp dr Jefm Petty   Anticipated DC Date:  02/15/2015   Anticipated DC Plan:  [place]  CM consult      [facility] Choice  HOME HEALTH   Choice offered to / List presented to:  C-4 Adult Children   DME arranged  [facility]      DME agency  [place] arranged  [place].   Status of service:  Completed, signed off Medicare Important Message given?  YES (If response is "NO", the following Medicare IM given date fields will be blank) Date Medicare IM given:  02/09/2015 Medicare IM given by:  Elissa Hefty Date Additional Medicare IM given:  02/14/2015 Additional Medicare IM given by:  Jonnie Finner  Discharge Disposition:  HOME/SELF CARE  Per UR Regulation:  Reviewed for med. necessity/level of care/duration of stay  If discussed at [place] of Stay Meetings, dates discussed:    Comments:  02/14/2015 1545 Provided pt with HHA list. NCM spoke to dtr and requested [facility] for [facility]. Contacted [facility] for [facility]. Notified DME rep for oxygen for dc scheduled 4/5. Faxed referral to [facility] for PT/INR draw on Friday, 02/18/15. Will fax dc summary to [place]. Appt time 02/18/2015 at 9 am. Jonnie Finner RN CCM Case Mgmt phone [phone]  02/14/2015 1100 NCM spoke to pt and gave pemission to speak to dtr, Cyril Loosen # [phone], address, [address] [place] [address].  Dtr states pt will be staying with her temp until she is stronger. Dtr states pt see [facility], Dr Jeralene Huff and they use the [facility] for PT/INR draws for Coumadin [phone] fax [phone]. Contacted Clinic and states a  referral form in  required. Provided NCM fax number. Pt will need oxygen at time of dc. She has RW at home. No other DME requested at this time. Notified attending, Dr. Carles Collet. Possible dc on 02/15/2015. Will need PT/INR drawn on Friday, April 8. Jonnie Finner RN CCM Case Mgmt phone [phone]

## 2015-02-14 NOTE — Progress Notes (Signed)
  SATURATION QUALIFICATIONS: (This note is used to comply with regulatory documentation for home oxygen)  Saturation at rest on 2L O2 = 93%  Patient Saturations on Room Air at Rest = 88%  Patient Saturations on Room Air while Ambulating = 75%  Patient Saturations on 2 Liters of oxygen while Ambulating = 80%                                   On 3 L while ambulating                         = 88%  Please briefly explain why patient needs home oxygen: Pt with PE, desaturating at rest and with activity when on RA.  [name] May, [place]  [phone]

## 2015-02-15 LAB — PROTIME-INR
INR: 2.08 — AB (ref 0.00–1.49)
PROTHROMBIN TIME: 23.6 s — AB (ref 11.6–15.2)

## 2015-02-15 LAB — HEPARIN LEVEL (UNFRACTIONATED): HEPARIN UNFRACTIONATED: 0.46 [IU]/mL (ref 0.30–0.70)

## 2015-02-15 MED ORDER — HYDROCODONE-ACETAMINOPHEN 5-325 MG PO TABS: 1.0000 | ORAL_TABLET | Freq: Four times a day (QID) | ORAL | Status: DC | PRN

## 2015-02-15 MED ORDER — WARFARIN SODIUM 5 MG PO TABS: 7.5000 mg | ORAL_TABLET | Freq: Every day | ORAL | Status: DC

## 2015-02-15 NOTE — Progress Notes (Signed)
ANTICOAGULATION CONSULT NOTE - Follow Up Consult  Pharmacy Consult for Heparin/warfarin Indication: PE  No Known Allergies  Patient Measurements: Height: 5' (152.4 cm) Weight: 172 lb 2.9 oz (78.1 kg) IBW/kg (Calculated) : 45.5 Heparin Dosing Weight: 64 kg  Vital Signs: Temp: 98 F (36.7 C) (04/05 0500) Temp Source: Oral (04/05 0500) BP: 113/60 mmHg (04/05 0500) Pulse Rate: 82 (04/05 0500)  Labs:  Recent Labs  02/13/15 0515 02/14/15 0431 02/15/15 0455  HGB 10.3*  --   --   HCT 36.3  --   --   PLT 336  --   --   LABPROT 19.1* 22.0* 23.6*  INR 1.59* 1.91* 2.08*  HEPARINUNFRC 0.41 0.38 0.46    Estimated Creatinine Clearance: 56.6 mL/min (by C-G formula based on Cr of 0.83).   Assessment: Anticoagulation - new bilateral PE with right heart strain (per CT) and doppler 3/28 shows bilateral LLE DVT. Heparin started at [facility].   Heparin level and INR are now both within goal range.  Can d/c heparin now that INR therapeutic on Coumadin.  Pt does have recent hx of DVTs and IVC filter placed 02/08/15. FVL + Anticipate lifelong coumadin.  Goal of Therapy:  Heparin level 0.3-0.7 units/ml Monitor platelets by anticoagulation protocol: Yes   Plan:  D/C heparin and associated labs Warfarin 7.5mg  tonight Recommend d/c home on Warfarin 7.5mg  daily with next INR check on Friday.   [employer], Pharm.D., BCPS Clinical Pharmacist Pager [phone] 02/15/2015 12:50 PM

## 2015-02-15 NOTE — Progress Notes (Signed)
Pt/family given discharge instructions, medication lists, follow up appointments, and when to call the doctor.  Pt/family verbalizes understanding. Pt given signs and symptoms of infection. Transported pt to main entrance with home oxygen. Payton Emerald, RN

## 2015-02-16 NOTE — Discharge Summary (Signed)
Physician Discharge Summary  Jazleen Robeck BTD:[id number] DOB: 1941-08-20 DOA: 02/06/2015  PCP: Wonda Cerise, MD  Admit date: 02/06/2015 Discharge date: 02/15/2015 Recommendations for Outpatient Follow-up:  1. Pt will need to follow up with PCP in 2 weeks post discharge 2. Please obtain INR and CBC on 02/17/14 and adjust Coumadin dose for INR 2-3  Discharge Diagnoses:  Acute hypoxic respiratory failure secondary to submassive PE -Initially Admitted to stepdown On IV heparin drip.  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 -Appreciate pulmonary consult and recommendation.  -started on Coumadin  -Right heart strain commented on CT scan was not evident on 2-D echo. -She will need to be on lifelong Coumadin. -Factor V Leiden positive  -Continued heparin bridge until INR was therapeutic -INR 2.08 on day of d/c -Instructed pt to take warfarin 7.5mg  on 4/5 and 4/6, then 5mg  on 4/7 -Pt has appt at Coumadin clinic on 02/18/15 am during which time her dosing can be adjusted if needed at [facility]  Elevated troponin -secondary to acute PE. EKG unremarkable. Echo without wall motion abnormality. -No chest pain presently   Normocytic/ iron deficiency anemia -Patient transfusion 2 units PRBC with good improvement.  -Reports following up with hematologist in [facility]. -given IV dose nulecit -Hgb remains stable after transfusion  Acute kidney injury on admission -Mild. Resolved -Serum creatinine 1.39 on 02/06/2015  Chronic back pain Underlying scoliosis. Pain control with when necessary Vicodin  GERD Continue PPI  Diarrhea  -C. difficile PCR--neg  Restless Leg -pt takes Requip 1mg  in am -then 2mg  @6pm  and 2mg  @ 9pm  Discharge Condition: stable  Disposition: home Follow-up Information    Follow up with [facility].   Why:  Home Health Physical Therapy   Contact information:   [address] [place] [address] [phone]       Follow up with [facility].   Why:  appointment time 02/18/2015 at 9 am. to have PT/INR labs drawn for Coumadin   Contact information:   [address]  [place] [address] [phone]      Follow up with [facility].   Why:  Home 02 arranged- portable tank to be delivered to room prior to discharge   Contact information:   [address] [place] [address] [phone]       Diet:heart healthy Wt Readings from Last 3 Encounters:  02/06/15 78.1 kg (172 lb 2.9 oz)  05/27/14 72.576 kg (160 lb)  03/16/14 72.576 kg (160 lb)    History of present illness:  74 year old female with PMH of a PE in her 53s treated with a course of coumadin. She has been becoming more tired lately and has been leading more of a sedentary life style. She denies recent trips or plan/car rides or family history of clots. She does endorse however that her right leg has been more swollen for which she has been on lasix for that. She reports sudden SOB starting 2 days prior to admission but it had been getting worse. She presented to [facility] ED. A CT was done that showed bilateral PE R>L with R-heart strain. Pt was admitted to stepdown unit and started on heparin drip.  CCM was consulted. Echo did not show any RV compromise.  She was stabilzed and IVC filter was place.  She was transitioned to telemetry.  Heparin bridge was continued while she was on warfarin.  Pt remained clinically stable thereafter.  She had oxygen desaturation with ambulation so home oxygen was set up.  When INR was >2, heparin drip was discontinued.  Follow up was arranged with assistance of case management for pt to go to coumadin clinic at [facility] for INR check.  Pt was d/ced in stable condition.  Consultants: Critical care medicine  Discharge Exam: Filed Vitals:   02/15/15 0500  BP: 113/60    Pulse: 82  Temp: 98 F (36.7 C)  Resp: 18   Filed Vitals:   02/14/15 0405 02/14/15 1347 02/14/15 2147 02/15/15 0500  BP: 113/67 115/62 137/58 113/60  Pulse: 75 84 92 82  Temp: 97.7 F (36.5 C) 98.2 F (36.8 C) 98 F (36.7 C) 98 F (36.7 C)  TempSrc: Oral Oral Oral Oral  Resp: 18 18 18 18   Height:      Weight:      SpO2: 93% 92% 96% 96%   General: A&O x 3, NAD, pleasant, cooperative Cardiovascular: RRR, no rub, no gallop, no S3 Respiratory: diminished BS at bases without wheeze Abdomen:soft, nontender, nondistended, positive bowel sounds Extremities: trace LEedema, No lymphangitis, no petechiae  Discharge Instructions      Discharge Instructions    Diet - low sodium heart healthy    Complete by:  As directed      Increase activity slowly    Complete by:  As directed             Medication List    STOP taking these medications        ibuprofen 600 MG tablet  Commonly known as:  ADVIL,MOTRIN     oxyCODONE-acetaminophen 10-325 MG per tablet  Commonly known as:  PERCOCET     promethazine 12.5 MG tablet  Commonly known as:  PHENERGAN      TAKE these medications        buPROPion 300 MG 24 hr tablet  Commonly known as:  WELLBUTRIN XL  Take 300 mg by mouth daily.     CALCIUM + D PO  Take 1 tablet by mouth daily.     clorazepate 3.75 MG tablet  Commonly known as:  TRANXENE  Take 3.75 mg by mouth 2 (two) times daily.     furosemide 20 MG tablet  Commonly known as:  LASIX  Take 20 mg by mouth daily as needed for fluid or edema.     gabapentin 600 MG tablet  Commonly known as:  NEURONTIN  Take 600 mg by mouth 3 (three) times daily. 15:00, 18:00, and 21:00     HYDROcodone-acetaminophen 5-325 MG per tablet  Commonly known as:  NORCO/VICODIN  Take 1-2 tablets by mouth every 6 (six) hours as needed for moderate pain.     hydrocortisone 2.5 % cream  Apply 1 application topically daily as needed (eczema).     lamoTRIgine 200 MG tablet  Commonly known  as:  LAMICTAL  Take 200 mg by mouth at bedtime.     loperamide 2 MG tablet  Commonly known as:  IMODIUM A-D  Take 4-6 mg by mouth daily as needed for diarrhea or loose stools.     loratadine 10 MG tablet  Commonly known as:  CLARITIN  Take 10 mg by mouth daily.     NU-IRON 150 MG capsule  Generic drug:  iron polysaccharides  Take 150 mg by mouth daily.     pantoprazole 40 MG tablet  Commonly known as:  PROTONIX  Take 40 mg by mouth  2 (two) times daily as needed (acid reflux).     rOPINIRole 2 MG tablet  Commonly known as:  REQUIP  Take 2 mg by mouth 2 (two) times daily.     rOPINIRole 1 MG tablet  Commonly known as:  REQUIP  Take 1 mg by mouth every morning.     warfarin 5 MG tablet  Commonly known as:  COUMADIN  Take 1.5 tablets (7.5 mg total) by mouth daily at 6 PM. On 02/15/15 and on 02/16/15.  Then take 5mg  on 02/17/15 until seen by MD on 02/18/15.     zolpidem 5 MG tablet  Commonly known as:  AMBIEN  Take 5 mg by mouth at bedtime as needed for sleep.         The results of significant diagnostics from this hospitalization (including imaging, microbiology, ancillary and laboratory) are listed below for reference.    Significant Diagnostic Studies: Dg Chest 2 View  02/06/2015   CLINICAL DATA:  Shortness of Breath  EXAM: CHEST  2 VIEW  COMPARISON:  None.  FINDINGS: Cardiomediastinal silhouette is unremarkable. Significant dextroscoliosis lower thoracic spine. Levoscoliosis upper thoracic spine. Small hiatal hernia. No acute infiltrate or pulmonary edema. There is small focal herniation of right posterior diaphragm best seen on lateral view. Thoracic spine osteopenia.  IMPRESSION: Thoracic scoliosis.  No active disease.  Small hiatal hernia.   Electronically Signed   By: Lahoma Crocker M.D.   On: 02/06/2015 10:30   Ct Angio Chest Pe W/cm &/or Wo Cm  02/06/2015   CLINICAL DATA:  Two day history of chest pain and difficulty breathing  EXAM: CT ANGIOGRAPHY CHEST WITH CONTRAST   TECHNIQUE: Multidetector CT imaging of the chest was performed using the standard protocol during bolus administration of intravenous contrast. Multiplanar CT image reconstructions and MIPs were obtained to evaluate the vascular anatomy.  CONTRAST:  38mL OMNIPAQUE IOHEXOL 350 MG/ML SOLN  COMPARISON:  Chest radiograph February 06, 2015  FINDINGS: There is extensive pulmonary embolism arising from the right main pulmonary artery extending into multiple right lower lobe branches as well as into the anterior segment right upper lobe branch. There are pulmonary emboli in the distal left main pulmonary artery with pulmonary emboli extending into the proximal upper and lower lobe pulmonary arteries. There is right heart strain with the right ventricle to left ventricle diameter ratio measuring 1.5, normal less than 0.9.  There is no thoracic aortic aneurysm or dissection.  There is a moderate hiatal hernia.  There is underlying centrilobular emphysematous change. There is patchy atelectasis in both lung bases, slightly more on the left than on the right.  There is no appreciable thoracic adenopathy. There are multiple foci of coronary artery calcification. The pericardium is not thickened. There is enlargement of the left lobe of the thyroid with a focal calcified nodular area in the left lobe measuring 1.1 x 0.9 cm.  In the visualized upper abdomen, there is hepatic steatosis.  There is degenerative change in the thoracic spine with upper thoracic levoscoliosis. There are no blastic or lytic bone lesions.  Review of the MIP images confirms the above findings.  IMPRESSION: Positive for acute PE with CT evidence of right heart strain (RV/LV Ratio = 1.5) consistent with at least submassive (intermediate risk) PE. The presence of right heart strain has been associated with an increased risk of morbidity and mortality. Please activate Code PE by paging [phone].  Underlying emphysema with patchy bibasilar atelectatic change,  slightly more on the left than  on the right.  Enlarged left lobe of thyroid with calcified nodule in left lobe thyroid. Consider further evaluation with thyroid ultrasound. If patient is clinically hyperthyroid, consider nuclear medicine thyroid uptake and scan.  Hepatic steatosis.  No appreciable adenopathy.  Critical Value/emergent results were called by telephone at the time of interpretation on 02/06/2015 at 11:51 am to Dr. Alfonzo Beers , who verbally acknowledged these results.   Electronically Signed   By: Lowella Grip III M.D.   On: 02/06/2015 11:52   Ir Ivc Filter Plmt / S&i /img Guid/mod Sed  02/08/2015   CLINICAL DATA:  ACUTE BILATERAL PULMONARY EMBOLI WITH RIGHT HEART STRAIN. ACUTE BILATERAL DVT  EXAM: ULTRASOUND GUIDANCE FOR VASCULAR ACCESS  IVC CATHETERIZATION AND VENOGRAM  IVC FILTER INSERTION  Date:  3/29/20163/29/2016 4:53 pm  Radiologist:  M. Daryll Brod, MD  Guidance:  Ultrasound fluoroscopic  FLUOROSCOPY TIME:  2 minutes 6 seconds, 130 mGy  MEDICATIONS AND MEDICAL HISTORY: 0.5 mg Versed, 25 mcg fentanyl  ANESTHESIA/SEDATION: 15 minutes  CONTRAST:  9mL OMNIPAQUE IOHEXOL 300 MG/ML  SOLN  COMPLICATIONS: None immediate  PROCEDURE: Informed consent was obtained from the patient following explanation of the procedure, risks, benefits and alternatives. The patient understands, agrees and consents for the procedure. All questions were addressed. A time out was performed.  Maximal barrier sterile technique utilized including caps, mask, sterile gowns, sterile gloves, large sterile drape, hand hygiene, and betadine prep.  Under sterile condition and local anesthesia, right common femoral venous access was performed with ultrasound. Over a guide wire, the IVC filter delivery sheath and inner dilator were advanced into the IVC just above the IVC bifurcation. Contrast injection was performed for an IVC venogram.  IVC VENOGRAM: The IVC is patent. No evidence of thrombus, stenosis, or occlusion. No  variant venous anatomy. The renal veins are identified at L1.  IVC FILTER INSERTION: Through the delivery sheath, the Bard Denali IVC filter was deployed in the infrarenal IVC at the L2-3 level just below the renal veins and above the IVC bifurcation. Contrast injection confirmed position. There is good apposition of the filter against the IVC.  The delivery sheath was removed and hemostasis was obtained with compression for 5 minutes. The patient tolerated the procedure well. No immediate complications.  IMPRESSION: Ultrasound and fluoroscopically guided infrarenal IVC filter insertion.   Electronically Signed   By: Jerilynn Mages.  Shick M.D.   On: 02/08/2015 17:01   Dg Chest Port 1 View  02/08/2015   CLINICAL DATA:  Acute pulmonary emboli identified 2 days ago, now with worsening shortness of breath and wheezing.  EXAM: PORTABLE CHEST - 1 VIEW  COMPARISON:  Two-view chest x-ray and CTA chest 02/06/2015.  FINDINGS: Markedly suboptimal inspiration with atelectasis in the lung bases. Lungs remain clear otherwise. Cardiac silhouette moderately enlarged, unchanged. Pulmonary vascularity normal. Possible small bilateral pleural effusions.  IMPRESSION: 1. Suboptimal inspiration accounts for bibasilar atelectasis. 2. Possible small bilateral pleural effusions. 3.  No acute cardiopulmonary disease otherwise.   Electronically Signed   By: Evangeline Dakin M.D.   On: 02/08/2015 08:33     Microbiology: Recent Results (from the past 240 hour(s))  MRSA PCR Screening     Status: None   Collection Time: 02/06/15  3:08 PM  Result Value Ref Range Status   MRSA by PCR NEGATIVE NEGATIVE Final    Comment:        The GeneXpert MRSA Assay (FDA approved for NASAL specimens only), is one component of a comprehensive MRSA colonization surveillance  program. It is not intended to diagnose MRSA infection nor to guide or monitor treatment for MRSA infections.   Clostridium Difficile by PCR     Status: None   Collection Time:  02/11/15 11:13 AM  Result Value Ref Range Status   C difficile by pcr NEGATIVE NEGATIVE Final     Labs: Basic Metabolic Panel:  Recent Labs Lab 02/12/15 0451  NA 137  K 3.6  CL 105  CO2 29  GLUCOSE 90  BUN 8  CREATININE 0.83  CALCIUM 9.0   Liver Function Tests: No results for input(s): AST, ALT, ALKPHOS, BILITOT, PROT, ALBUMIN in the last 168 hours. No results for input(s): LIPASE, AMYLASE in the last 168 hours. No results for input(s): AMMONIA in the last 168 hours. CBC:  Recent Labs Lab 02/10/15 0252 02/11/15 0408 02/12/15 0451 02/13/15 0515  WBC 8.3 7.0 6.5 5.6  HGB 10.5* 9.5* 10.6* 10.3*  HCT 35.6* 32.5* 36.5 36.3  MCV 83.8 83.8 83.9 83.8  PLT 282 294 334 336   Cardiac Enzymes: No results for input(s): CKTOTAL, CKMB, CKMBINDEX, TROPONINI in the last 168 hours. BNP: Invalid input(s): POCBNP CBG:  Recent Labs Lab 02/09/15 1141 02/09/15 1639  GLUCAP 107* 92    Time coordinating discharge:  Greater than 30 minutes  Signed:  Shandria Clinch, DO [facility] Pager: [phone] 02/16/2015, 8:39 AM

## 2015-05-26 ENCOUNTER — Encounter (HOSPITAL_COMMUNITY): Payer: Self-pay

## 2015-05-26 ENCOUNTER — Emergency Department (HOSPITAL_COMMUNITY): Payer: No Typology Code available for payment source

## 2015-05-26 ENCOUNTER — Emergency Department (HOSPITAL_COMMUNITY)
Admission: EM | Admit: 2015-05-26 | Discharge: 2015-05-27 | Disposition: A | Discharge status: Home / Self Care | Payer: No Typology Code available for payment source | Attending: Emergency Medicine | Admitting: Emergency Medicine

## 2015-05-26 DIAGNOSIS — S20312A Abrasion of left front wall of thorax, initial encounter: Secondary | ICD-10-CM | POA: Diagnosis not present

## 2015-05-26 DIAGNOSIS — Z86711 Personal history of pulmonary embolism: Secondary | ICD-10-CM | POA: Insufficient documentation

## 2015-05-26 DIAGNOSIS — Y9389 Activity, other specified: Secondary | ICD-10-CM | POA: Diagnosis not present

## 2015-05-26 DIAGNOSIS — Z87891 Personal history of nicotine dependence: Secondary | ICD-10-CM | POA: Diagnosis not present

## 2015-05-26 DIAGNOSIS — T1490XA Injury, unspecified, initial encounter: Secondary | ICD-10-CM

## 2015-05-26 DIAGNOSIS — F419 Anxiety disorder, unspecified: Secondary | ICD-10-CM | POA: Insufficient documentation

## 2015-05-26 DIAGNOSIS — D649 Anemia, unspecified: Secondary | ICD-10-CM | POA: Diagnosis not present

## 2015-05-26 DIAGNOSIS — Z79899 Other long term (current) drug therapy: Secondary | ICD-10-CM | POA: Diagnosis not present

## 2015-05-26 DIAGNOSIS — S20219A Contusion of unspecified front wall of thorax, initial encounter: Secondary | ICD-10-CM | POA: Diagnosis not present

## 2015-05-26 DIAGNOSIS — Y998 Other external cause status: Secondary | ICD-10-CM | POA: Diagnosis not present

## 2015-05-26 DIAGNOSIS — Y92410 Unspecified street and highway as the place of occurrence of the external cause: Secondary | ICD-10-CM | POA: Diagnosis not present

## 2015-05-26 DIAGNOSIS — Z7901 Long term (current) use of anticoagulants: Secondary | ICD-10-CM | POA: Diagnosis not present

## 2015-05-26 DIAGNOSIS — G8929 Other chronic pain: Secondary | ICD-10-CM | POA: Insufficient documentation

## 2015-05-26 DIAGNOSIS — E785 Hyperlipidemia, unspecified: Secondary | ICD-10-CM | POA: Diagnosis not present

## 2015-05-26 DIAGNOSIS — S299XXA Unspecified injury of thorax, initial encounter: Secondary | ICD-10-CM | POA: Diagnosis present

## 2015-05-26 LAB — BASIC METABOLIC PANEL
Anion gap: 6 (ref 5–15)
BUN: 15 mg/dL (ref 6–20)
CO2: 26 mmol/L (ref 22–32)
Calcium: 9.3 mg/dL (ref 8.9–10.3)
Chloride: 104 mmol/L (ref 101–111)
Creatinine, Ser: 0.91 mg/dL (ref 0.44–1.00)
GFR calc Af Amer: >60 mL/min (ref >60)
GFR calc non Af Amer: >60 mL/min (ref >60)
Glucose, Bld: 91 mg/dL (ref 65–99)
Potassium: 4.1 mmol/L (ref 3.5–5.1)
Sodium: 136 mmol/L (ref 135–145)

## 2015-05-26 LAB — PROTIME-INR
INR: 2.94 — ABNORMAL HIGH (ref 0.00–1.49)
Prothrombin Time: 30.2 seconds — ABNORMAL HIGH (ref 11.6–15.2)

## 2015-05-26 LAB — CBC
HCT: 36.1 % (ref 36.0–46.0)
Hemoglobin: 11.3 g/dL — ABNORMAL LOW (ref 12.0–15.0)
MCH: 26.8 pg (ref 26.0–34.0)
MCHC: 31.3 g/dL (ref 30.0–36.0)
MCV: 85.5 fL (ref 78.0–100.0)
Platelets: 289 10*3/uL (ref 150–400)
RBC: 4.22 MIL/uL (ref 3.87–5.11)
RDW: 17.9 % — ABNORMAL HIGH (ref 11.5–15.5)
WBC: 12.9 10*3/uL — ABNORMAL HIGH (ref 4.0–10.5)

## 2015-05-26 MED ORDER — OXYCODONE-ACETAMINOPHEN 5-325 MG PO TABS: 1.0000 | ORAL_TABLET | Freq: Four times a day (QID) | ORAL | Status: DC | PRN

## 2015-05-26 MED ORDER — IOHEXOL 300 MG/ML  SOLN
100.0000 mL | Freq: Once | INTRAMUSCULAR | Status: AC | PRN
  Administered 2015-05-26: 80 mL via INTRAVENOUS

## 2015-05-26 NOTE — ED Notes (Signed)
Patient transported to CT 

## 2015-05-26 NOTE — Discharge Instructions (Signed)
Return here as needed.  Follow-up with your primary care doctor °
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
## 2015-05-26 NOTE — ED Notes (Signed)
Pt wondering if she should take home scheduled coumadin. Lawyer, PA-C states to hold off of coumadin.
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
## 2015-05-26 NOTE — ED Notes (Signed)
Bed: WA04 Expected date:  Expected time:  Means of arrival:  Comments: triage 

## 2015-05-27 DIAGNOSIS — S20219A Contusion of unspecified front wall of thorax, initial encounter: Secondary | ICD-10-CM | POA: Diagnosis not present

## 2015-05-27 MED ORDER — OXYCODONE-ACETAMINOPHEN 5-325 MG PO TABS
1.0000 | ORAL_TABLET | Freq: Once | ORAL | Status: AC
  Administered 2015-05-27: 1 via ORAL
  Filled 2015-05-27: qty 1

## 2017-04-22 ENCOUNTER — Encounter (HOSPITAL_COMMUNITY): Payer: Self-pay

## 2017-04-22 ENCOUNTER — Inpatient Hospital Stay (HOSPITAL_COMMUNITY)
Admission: EM | Admit: 2017-04-22 | Discharge: 2017-04-24 | DRG: 300 | Disposition: A | Discharge status: Home / Self Care | Payer: Medicare HMO | Attending: Internal Medicine | Admitting: Internal Medicine

## 2017-04-22 ENCOUNTER — Emergency Department (HOSPITAL_COMMUNITY): Payer: Medicare HMO

## 2017-04-22 DIAGNOSIS — Z86718 Personal history of other venous thrombosis and embolism: Secondary | ICD-10-CM

## 2017-04-22 DIAGNOSIS — Z79899 Other long term (current) drug therapy: Secondary | ICD-10-CM

## 2017-04-22 DIAGNOSIS — N179 Acute kidney failure, unspecified: Secondary | ICD-10-CM | POA: Diagnosis present

## 2017-04-22 DIAGNOSIS — Z9109 Other allergy status, other than to drugs and biological substances: Secondary | ICD-10-CM

## 2017-04-22 DIAGNOSIS — Z91040 Latex allergy status: Secondary | ICD-10-CM

## 2017-04-22 DIAGNOSIS — I82492 Acute embolism and thrombosis of other specified deep vein of left lower extremity: Secondary | ICD-10-CM | POA: Diagnosis not present

## 2017-04-22 DIAGNOSIS — Z87891 Personal history of nicotine dependence: Secondary | ICD-10-CM

## 2017-04-22 DIAGNOSIS — R609 Edema, unspecified: Secondary | ICD-10-CM

## 2017-04-22 DIAGNOSIS — D509 Iron deficiency anemia, unspecified: Secondary | ICD-10-CM | POA: Diagnosis present

## 2017-04-22 DIAGNOSIS — Z90710 Acquired absence of both cervix and uterus: Secondary | ICD-10-CM

## 2017-04-22 DIAGNOSIS — N289 Disorder of kidney and ureter, unspecified: Secondary | ICD-10-CM

## 2017-04-22 DIAGNOSIS — Z7982 Long term (current) use of aspirin: Secondary | ICD-10-CM

## 2017-04-22 DIAGNOSIS — D649 Anemia, unspecified: Secondary | ICD-10-CM | POA: Diagnosis present

## 2017-04-22 DIAGNOSIS — R600 Localized edema: Secondary | ICD-10-CM | POA: Diagnosis present

## 2017-04-22 DIAGNOSIS — G2581 Restless legs syndrome: Secondary | ICD-10-CM | POA: Diagnosis present

## 2017-04-22 LAB — URINALYSIS, ROUTINE W REFLEX MICROSCOPIC
Bilirubin Urine: NEGATIVE
Glucose, UA: NEGATIVE mg/dL
Ketones, ur: NEGATIVE mg/dL
NITRITE: NEGATIVE
Protein, ur: NEGATIVE mg/dL
Specific Gravity, Urine: 1.01 (ref 1.005–1.030)
pH: 5 (ref 5.0–8.0)

## 2017-04-22 LAB — BASIC METABOLIC PANEL
Anion gap: 12 (ref 5–15)
BUN: 50 mg/dL — AB (ref 6–20)
CHLORIDE: 100 mmol/L — AB (ref 101–111)
CO2: 22 mmol/L (ref 22–32)
Calcium: 8.9 mg/dL (ref 8.9–10.3)
Creatinine, Ser: 2.82 mg/dL — ABNORMAL HIGH (ref 0.44–1.00)
GFR calc non Af Amer: 15 mL/min — ABNORMAL LOW (ref >60)
GFR, EST AFRICAN AMERICAN: 18 mL/min — AB (ref >60)
Glucose, Bld: 92 mg/dL (ref 65–99)
Potassium: 3.6 mmol/L (ref 3.5–5.1)
Sodium: 134 mmol/L — ABNORMAL LOW (ref 135–145)

## 2017-04-22 LAB — CBC
HEMATOCRIT: 28.9 % — AB (ref 36.0–46.0)
HEMOGLOBIN: 9.5 g/dL — AB (ref 12.0–15.0)
MCH: 31.1 pg (ref 26.0–34.0)
MCHC: 32.9 g/dL (ref 30.0–36.0)
MCV: 94.8 fL (ref 78.0–100.0)
Platelets: 220 10*3/uL (ref 150–400)
RBC: 3.05 MIL/uL — ABNORMAL LOW (ref 3.87–5.11)
RDW: 13 % (ref 11.5–15.5)
WBC: 8.2 10*3/uL (ref 4.0–10.5)

## 2017-04-22 MED ORDER — SODIUM CHLORIDE 0.9% FLUSH: 10.0000 mL | INTRAVENOUS | Status: DC | PRN

## 2017-04-22 MED ORDER — ENOXAPARIN SODIUM 80 MG/0.8ML ~~LOC~~ SOLN
1.0000 mg/kg | Freq: Two times a day (BID) | SUBCUTANEOUS | Status: DC
  Administered 2017-04-22 – 2017-04-24 (×4): 70 mg via SUBCUTANEOUS
  Filled 2017-04-22 (×4): qty 0.8

## 2017-04-22 MED ORDER — SODIUM CHLORIDE 0.9 % IV BOLUS (SEPSIS)
500.0000 mL | Freq: Once | INTRAVENOUS | Status: AC
  Administered 2017-04-22: 500 mL via INTRAVENOUS

## 2017-04-22 NOTE — ED Provider Notes (Signed)
[name] DEPT Provider Note   CSN: [id number] Arrival date & time: 04/22/17  1734     History   Chief Complaint Chief Complaint  Patient presents with  . Leg Swelling    HPI [name] May is a 76 y.o. female.  This is a pleasant 76 year old female with multiple medical problems who presents today with 2 days of swelling to the groin of the left leg. 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  She does have an a filter and takes 325 mg aspirin daily per her hematologist. She states that she did take 40 mg of Lasix to help with the swelling and it has softened the leg a bit. She denies any recent illness.  She does state that she drove to [facility] over the weekend but that is her only sedentary travel in the past several months. She is denying any shortness of breath or chest pain.  No change in appetite.  No nausea, vomiting or diarrhea.  No fever      Past Medical History:  Diagnosis Date  . Allergy   . Anemia   . Back pain   . Bipolar disorder ([place])   . Chronic headaches   . Degenerative disorder of bone   . Depression   . Esophageal ulcer   . Hyperlipidemia   . Pulmonary embolism ([place])   . Scoliosis     Patient Active Problem List   Diagnosis Date Noted  . ARF (acute renal failure) ([place]) 04/22/2017  . DVT of lower extremity, bilateral ([place]) 02/10/2015  . PE (pulmonary embolism)   . Shortness of breath   . Normocytic anemia 02/07/2015  . DVT (deep venous thrombosis) ([place])   . Acute pulmonary embolism ([place]) 02/06/2015  . Scoliosis of thoracic spine 02/06/2015  . GERD (gastroesophageal reflux disease) 02/06/2015  . Acute respiratory failure with hypoxia ([place]) 02/06/2015  . Acute kidney injury ([place]) 02/06/2015  . Elevated troponin 02/06/2015    Past Surgical History:  Procedure Laterality Date  . ABDOMINAL HYSTERECTOMY    . ANKLE SURGERY    . LAMINECTOMY    . NASAL SINUS SURGERY      OB History    No data available       Home  Medications    Prior to Admission medications   Medication Sig Start Date End Date Taking? Authorizing Provider  ascorbic acid (VITAMIN C) 250 MG tablet Take 250 mg by mouth 2 (two) times daily.   Yes [provider]  aspirin EC 325 MG tablet Take 325 mg by mouth at bedtime.   Yes [provider]  buPROPion (WELLBUTRIN XL) 300 MG 24 hr tablet Take 300 mg by mouth daily.  02/25/14  Yes [provider]  Calcium Carbonate-Vitamin D (CALCIUM + D PO) Take 1 tablet by mouth 2 (two) times daily.    Yes [provider]  dicyclomine (BENTYL) 20 MG tablet Take 20 mg by mouth every 6 (six) hours as needed for spasms.   Yes [provider]  furosemide (LASIX) 20 MG tablet Take 20 mg by mouth daily as needed for fluid or edema.    Yes [provider]  gabapentin (NEURONTIN) 600 MG tablet Take 600 mg by mouth 3 (three) times daily. AT 1500, 1800, and 2100 02/25/14  Yes [provider]  hydrocortisone 2.5 % cream Apply 1 application topically daily as needed (eczema).  01/06/14  Yes [provider]  ibuprofen (ADVIL,MOTRIN) 200 MG tablet Take 800 mg  by mouth every 6 (six) hours as needed (for pain).   Yes [provider]  iron polysaccharides (NU-IRON) 150 MG capsule Take 150 mg by mouth daily.   Yes [provider]  lamoTRIgine (LAMICTAL) 200 MG tablet Take 200 mg by mouth at bedtime.  02/20/14  Yes [provider]  loperamide (IMODIUM A-D) 2 MG tablet Take 4-6 mg by mouth daily as needed for diarrhea or loose stools.   Yes [provider]  loratadine (CLARITIN) 10 MG tablet Take 10 mg by mouth daily as needed (for seasonal allergies).    Yes [provider]  LORazepam (ATIVAN) 0.5 MG tablet Take 0.5 mg by mouth 2 (two) times daily.   Yes [provider]  pantoprazole (PROTONIX) 40 MG tablet Take 40 mg by mouth 2 (two) times daily as needed (acid reflux).  01/24/14  Yes [provider]  rOPINIRole (REQUIP) 1 MG tablet Take 1 mg by mouth every morning.  12/15/14  Yes [provider]  rOPINIRole (REQUIP) 2 MG tablet Take 2 mg by mouth 3 (three) times daily. AT 1500, 1800, and 2100 02/25/14  Yes [provider]  zolpidem (AMBIEN) 5 MG tablet Take 5 mg by mouth at bedtime as needed for sleep.  01/31/15  Yes [provider]  HYDROcodone-acetaminophen (NORCO/VICODIN) 5-325 MG per tablet Take 1-2 tablets by mouth every 6 (six) hours as needed for moderate pain. Patient not taking: Reported on 04/22/2017 02/15/15   Orson Eva, MD  oxyCODONE-acetaminophen (PERCOCET/ROXICET) 5-325 MG per tablet Take 1 tablet by mouth every 6 (six) hours as needed for severe pain. Patient not taking: Reported on 04/22/2017 05/26/15   Dalia Heading, PA-C  warfarin (COUMADIN) 5 MG tablet Take 1.5 tablets (7.5 mg total) by mouth daily at 6 PM. On 02/15/15 and on 02/16/15.  Then take 5mg  on 02/17/15 until seen by MD on 02/18/15. Patient not taking: Reported on 04/22/2017 02/15/15   Orson Eva, MD    Family History History reviewed. No pertinent family history.  Social History Social History  Substance Use Topics  . Smoking status: Former Research scientist (life sciences)  . Smokeless tobacco: Never Used     Comment: quit 22 years ago   . Alcohol use No     Allergies   Latex and Mold extract [trichophyton]   Review of Systems Review of Systems  Constitutional: Negative for fever.  Respiratory: Negative for shortness of breath.   Cardiovascular: Positive for leg swelling. Negative for chest pain.  Gastrointestinal: Negative for diarrhea, nausea and vomiting.  Genitourinary: Negative for decreased urine volume, dysuria and frequency.  Skin: Negative for wound.  Neurological: Negative for weakness.  All other systems reviewed and are negative.    Physical Exam Updated Vital Signs BP 106/62 (BP Location: Left Arm)   Pulse (!) 105   Temp 98.7 F (37.1 C) (Oral)   Resp 18   SpO2 91%   Physical  Exam  Constitutional: She is oriented to person, place, and time. She appears well-developed and well-nourished.  HENT:  Head: Normocephalic.  Eyes: Pupils are equal, round, and reactive to light.  Neck: Normal range of motion.  Cardiovascular: Regular rhythm.  Tachycardia present.   Pulmonary/Chest: Effort normal and breath sounds normal.  Abdominal: Soft. Bowel sounds are normal.  Musculoskeletal: She exhibits edema.       Right lower leg: She exhibits swelling and edema.       Legs: Neurological: She is alert and oriented to person, place, and time.  Skin:  Skin is warm and dry. There is erythema.  Slight erythema to lower left leg   Psychiatric: She has a normal mood and affect.  Nursing note and vitals reviewed.    ED Treatments / Results  Labs (all labs ordered are listed, but only abnormal results are displayed) Labs Reviewed  CBC - Abnormal; Notable for the following:       Result Value   RBC 3.05 (*)    Hemoglobin 9.5 (*)    HCT 28.9 (*)    All other components within normal limits  BASIC METABOLIC PANEL - Abnormal; Notable for the following:    Sodium 134 (*)    Chloride 100 (*)    BUN 50 (*)    Creatinine, Ser 2.82 (*)    GFR calc non Af Amer 15 (*)    GFR calc Af Amer 18 (*)    All other components within normal limits  URINALYSIS, ROUTINE W REFLEX MICROSCOPIC - Abnormal; Notable for the following:    Hgb urine dipstick SMALL (*)    Leukocytes, UA LARGE (*)    Bacteria, UA RARE (*)    Squamous Epithelial / LPF 0-5 (*)    All other components within normal limits    EKG  EKG Interpretation None       Radiology Dg Chest 2 View  Result Date: 04/22/2017 CLINICAL DATA:  Left leg swelling. EXAM: CHEST  2 VIEW COMPARISON:  02/06/2015 as well as chest CT 05/26/2015 FINDINGS: Patient is rotated to the right. Lungs are adequately inflated without focal consolidation or effusion. Eventration of the lateral right hemidiaphragm unchanged. Mild stable  cardiomegaly. Moderate size hiatal hernia. Calcified plaque over the thoracic aorta. Moderate curvature of the thoracic spine convex right unchanged diffuse osteopenia present. Degenerative change of the spine. IVC filter is present. IMPRESSION: No acute cardiopulmonary disease. Stable cardiomegaly. Moderate size hiatal hernia. Aortic atherosclerosis. Electronically Signed   By: Marin Olp M.D.   On: 04/22/2017 19:08   US Renal  Result Date: 04/22/2017 CLINICAL DATA:  Acute renal injury EXAM: RENAL / URINARY TRACT ULTRASOUND COMPLETE COMPARISON:  None. FINDINGS: Right Kidney: Length: 9.9 cm. Echogenicity within normal limits. No mass or hydronephrosis visualized. Slight cortical thinning of the right kidney. Left Kidney: Length: 8.8 cm. Echogenicity within normal limits. No mass or hydronephrosis visualized. Bladder: Appears normal for degree of bladder distention. IMPRESSION: No obstructive uropathy of either kidney. Slight cortical thinning of the right kidney. Maintained cortical-medullary differentiation is noted within both kidneys. Electronically Signed   By: Ashley Royalty M.D.   On: 04/22/2017 21:37    Procedures Procedures (including critical care time)  Medications Ordered in ED Medications  sodium chloride 0.9 % bolus 500 mL (not administered)  sodium chloride flush (NS) 0.9 % injection 10-40 mL (not administered)     Initial Impression / Assessment and Plan / ED Course  I have reviewed the triage vital signs and the nursing notes.  Pertinent labs & imaging results that were available during my care of the patient were reviewed by me and considered in my medical decision making (see chart for details).   patient's BUN is 50, and creatinine is 2.62, which compared to results from 2017, is increased  Patient is unaware of any change in her creatinine.  She has not had blood drawn by any of her physicians in over a year.  Will continue work up for AKI with renal ultrasound IV fluids  and probable admission   Will obtain Vascular doppler  of left lower leg tomorrow  Final Clinical Impressions(s) / ED Diagnoses   Final diagnoses:  Peripheral edema  Renal insufficiency    New Prescriptions New Prescriptions   No medications on file     Junius Creamer, NP 04/22/17 2225    Tanna Furry, MD 04/29/17 [phone]

## 2017-04-22 NOTE — ED Notes (Signed)
ED Provider at bedside. 

## 2017-04-22 NOTE — Progress Notes (Signed)
ANTICOAGULATION CONSULT NOTE - Initial Consult  Pharmacy Consult for Lovenox Indication: DVT  Allergies  Allergen Reactions  . Latex Itching and Rash    Blisters for months on legs  . Mold Extract [Trichophyton] Anaphylaxis, Swelling and Rash    Patient Measurements: Weight: 150 lb (68 kg)  Vital Signs: Temp: 98.7 F (37.1 C) (06/11 1800) Temp Source: Oral (06/11 1800) BP: 106/62 (06/11 1800) Pulse Rate: 105 (06/11 1800)  Labs:  Recent Labs  04/22/17 1824  HGB 9.5*  HCT 28.9*  PLT 220  CREATININE 2.82*    CrCl cannot be calculated (Unknown ideal weight.).   Medical History: Past Medical History:  Diagnosis Date  . Allergy   . Anemia   . Back pain   . Bipolar disorder ([place])   . Chronic headaches   . Degenerative disorder of bone   . Depression   . Esophageal ulcer   . Hyperlipidemia   . Pulmonary embolism ([place])   . Scoliosis     Medications:   (Not in a hospital admission) Scheduled:   Infusions:  . sodium chloride      Assessment: 76yo female with history of DVT and edema presents with leg swelling. Pharmacy is consulted to dose lovenox for VTE treatment.  Goal of Therapy:  Monitor platelets by anticoagulation protocol: Yes   Plan:  Lovenox 1mg /kg subcutaneously q12h Monitor s/sx of bleeding Monitor renal function  Andrey Cota. Diona Foley, PharmD, BCPS Clinical Pharmacist [phone] 04/22/2017,10:20 PM

## 2017-04-22 NOTE — ED Provider Notes (Signed)
Patient seen and evaluated. Discussed with Loni Muse PE. Patient with pronounced swelling over left looks really. His of multiple prior DVTs and PEs. Not currently anticoagulated. Has possibly new acute renal insufficiency. Only comparison is 2016. She takes Lasix infrequently, thus doubt prerenal. Ultrasound does not show renal source. Agree with Lovenox, AM ultrasound, further evaluation and her renal insufficiency.   Tanna Furry, MD 04/22/17 2241

## 2017-04-22 NOTE — ED Triage Notes (Signed)
Pt states she has severe swelling in the left leg. Leg is markedly swollen. Pt has hx of PE. Pt denies shortness of breath. She has filter in place.

## 2017-04-23 ENCOUNTER — Observation Stay (HOSPITAL_BASED_OUTPATIENT_CLINIC_OR_DEPARTMENT_OTHER): Payer: Medicare HMO

## 2017-04-23 ENCOUNTER — Encounter (HOSPITAL_COMMUNITY): Payer: Self-pay | Admitting: Internal Medicine

## 2017-04-23 DIAGNOSIS — Z7982 Long term (current) use of aspirin: Secondary | ICD-10-CM | POA: Diagnosis not present

## 2017-04-23 DIAGNOSIS — Z86718 Personal history of other venous thrombosis and embolism: Secondary | ICD-10-CM | POA: Diagnosis not present

## 2017-04-23 DIAGNOSIS — D649 Anemia, unspecified: Secondary | ICD-10-CM | POA: Diagnosis not present

## 2017-04-23 DIAGNOSIS — Z79899 Other long term (current) drug therapy: Secondary | ICD-10-CM | POA: Diagnosis not present

## 2017-04-23 DIAGNOSIS — I82492 Acute embolism and thrombosis of other specified deep vein of left lower extremity: Secondary | ICD-10-CM | POA: Diagnosis present

## 2017-04-23 DIAGNOSIS — Z90710 Acquired absence of both cervix and uterus: Secondary | ICD-10-CM | POA: Diagnosis not present

## 2017-04-23 DIAGNOSIS — Z9109 Other allergy status, other than to drugs and biological substances: Secondary | ICD-10-CM | POA: Diagnosis not present

## 2017-04-23 DIAGNOSIS — R600 Localized edema: Secondary | ICD-10-CM | POA: Diagnosis present

## 2017-04-23 DIAGNOSIS — D509 Iron deficiency anemia, unspecified: Secondary | ICD-10-CM | POA: Diagnosis present

## 2017-04-23 DIAGNOSIS — Z91040 Latex allergy status: Secondary | ICD-10-CM | POA: Diagnosis not present

## 2017-04-23 DIAGNOSIS — Z87891 Personal history of nicotine dependence: Secondary | ICD-10-CM | POA: Diagnosis not present

## 2017-04-23 DIAGNOSIS — R609 Edema, unspecified: Secondary | ICD-10-CM | POA: Diagnosis present

## 2017-04-23 DIAGNOSIS — G2581 Restless legs syndrome: Secondary | ICD-10-CM | POA: Diagnosis present

## 2017-04-23 DIAGNOSIS — N179 Acute kidney failure, unspecified: Secondary | ICD-10-CM | POA: Diagnosis present

## 2017-04-23 DIAGNOSIS — I82402 Acute embolism and thrombosis of unspecified deep veins of left lower extremity: Secondary | ICD-10-CM | POA: Diagnosis not present

## 2017-04-23 LAB — CBC
HCT: 27 % — ABNORMAL LOW (ref 36.0–46.0)
HEMOGLOBIN: 8.8 g/dL — AB (ref 12.0–15.0)
MCH: 31 pg (ref 26.0–34.0)
MCHC: 32.6 g/dL (ref 30.0–36.0)
MCV: 95.1 fL (ref 78.0–100.0)
Platelets: 218 10*3/uL (ref 150–400)
RBC: 2.84 MIL/uL — AB (ref 3.87–5.11)
RDW: 13.1 % (ref 11.5–15.5)
WBC: 7.3 10*3/uL (ref 4.0–10.5)

## 2017-04-23 LAB — BASIC METABOLIC PANEL
ANION GAP: 8 (ref 5–15)
BUN: 39 mg/dL — ABNORMAL HIGH (ref 6–20)
CALCIUM: 8.4 mg/dL — AB (ref 8.9–10.3)
CO2: 23 mmol/L (ref 22–32)
Chloride: 106 mmol/L (ref 101–111)
Creatinine, Ser: 1.7 mg/dL — ABNORMAL HIGH (ref 0.44–1.00)
GFR, EST AFRICAN AMERICAN: 33 mL/min — AB (ref >60)
GFR, EST NON AFRICAN AMERICAN: 28 mL/min — AB (ref >60)
Glucose, Bld: 94 mg/dL (ref 65–99)
Potassium: 3 mmol/L — ABNORMAL LOW (ref 3.5–5.1)
Sodium: 137 mmol/L (ref 135–145)

## 2017-04-23 LAB — CREATININE, URINE, RANDOM: CREATININE, URINE: 119.91 mg/dL

## 2017-04-23 LAB — SODIUM, URINE, RANDOM: Sodium, Ur: <10 mmol/L

## 2017-04-23 MED ORDER — LORAZEPAM 0.5 MG PO TABS
0.5000 mg | ORAL_TABLET | Freq: Two times a day (BID) | ORAL | Status: DC
  Administered 2017-04-23 – 2017-04-24 (×4): 0.5 mg via ORAL
  Filled 2017-04-23 (×4): qty 1

## 2017-04-23 MED ORDER — ZOLPIDEM TARTRATE 5 MG PO TABS
5.0000 mg | ORAL_TABLET | Freq: Every evening | ORAL | Status: DC | PRN
  Filled 2017-04-23: qty 1

## 2017-04-23 MED ORDER — SODIUM CHLORIDE 0.9 % IV SOLN
INTRAVENOUS | Status: DC
  Administered 2017-04-23: 1000 mL via INTRAVENOUS
  Administered 2017-04-23: 14:00:00 via INTRAVENOUS

## 2017-04-23 MED ORDER — DICYCLOMINE HCL 20 MG PO TABS: 20.0000 mg | ORAL_TABLET | Freq: Four times a day (QID) | ORAL | Status: DC | PRN

## 2017-04-23 MED ORDER — LAMOTRIGINE 100 MG PO TABS
200.0000 mg | ORAL_TABLET | Freq: Every day | ORAL | Status: DC
  Administered 2017-04-23 (×2): 200 mg via ORAL
  Filled 2017-04-23 (×3): qty 2

## 2017-04-23 MED ORDER — VITAMIN C 500 MG PO TABS
250.0000 mg | ORAL_TABLET | Freq: Two times a day (BID) | ORAL | Status: DC
  Administered 2017-04-23 – 2017-04-24 (×3): 250 mg via ORAL
  Filled 2017-04-23 (×4): qty 1

## 2017-04-23 MED ORDER — ROPINIROLE HCL 1 MG PO TABS
2.0000 mg | ORAL_TABLET | ORAL | Status: DC
  Administered 2017-04-23 – 2017-04-24 (×5): 2 mg via ORAL
  Filled 2017-04-23 (×5): qty 2

## 2017-04-23 MED ORDER — POLYSACCHARIDE IRON COMPLEX 150 MG PO CAPS
150.0000 mg | ORAL_CAPSULE | Freq: Every day | ORAL | Status: DC
  Administered 2017-04-23 – 2017-04-24 (×2): 150 mg via ORAL
  Filled 2017-04-23 (×3): qty 1

## 2017-04-23 MED ORDER — ACETAMINOPHEN 325 MG PO TABS
650.0000 mg | ORAL_TABLET | Freq: Four times a day (QID) | ORAL | Status: DC | PRN
  Administered 2017-04-23 (×2): 650 mg via ORAL
  Filled 2017-04-23 (×2): qty 2

## 2017-04-23 MED ORDER — ONDANSETRON HCL 4 MG PO TABS
4.0000 mg | ORAL_TABLET | Freq: Four times a day (QID) | ORAL | Status: DC | PRN
Start: 2017-04-23 — End: 2017-04-24

## 2017-04-23 MED ORDER — PANTOPRAZOLE SODIUM 40 MG PO TBEC: 40.0000 mg | DELAYED_RELEASE_TABLET | Freq: Two times a day (BID) | ORAL | Status: DC | PRN

## 2017-04-23 MED ORDER — ONDANSETRON HCL 4 MG/2ML IJ SOLN: 4.0000 mg | Freq: Four times a day (QID) | INTRAMUSCULAR | Status: DC | PRN

## 2017-04-23 MED ORDER — ACETAMINOPHEN 650 MG RE SUPP
650.0000 mg | Freq: Four times a day (QID) | RECTAL | Status: DC | PRN
Start: 2017-04-23 — End: 2017-04-24

## 2017-04-23 MED ORDER — GABAPENTIN 600 MG PO TABS
600.0000 mg | ORAL_TABLET | ORAL | Status: DC
  Administered 2017-04-23 – 2017-04-24 (×5): 600 mg via ORAL
  Filled 2017-04-23 (×5): qty 1

## 2017-04-23 MED ORDER — LORATADINE 10 MG PO TABS: 10.0000 mg | ORAL_TABLET | Freq: Every day | ORAL | Status: DC | PRN

## 2017-04-23 MED ORDER — BUPROPION HCL ER (XL) 150 MG PO TB24
300.0000 mg | ORAL_TABLET | Freq: Every day | ORAL | Status: DC
  Administered 2017-04-23 – 2017-04-24 (×2): 300 mg via ORAL
  Filled 2017-04-23 (×2): qty 2

## 2017-04-23 NOTE — Care Management Note (Signed)
Case Management Note  Patient Details  Name: [name] May MRN: [id number] Date of Birth: Sep 15, 1941  Subjective/Objective:      CM following for progression and d/c planning. Pt lives alone, independent prior to admission. Has a daughter and son who live in the area and are very supportive.               Action/Plan: Have checked for copays for possible anticoagulates at d/c. With help of CMA pt copay for both Xarelto and Elequis would be $47 for a month supply. Now awaiting copay for Lovenox as a possible alternative due to pt hx of bleeding with other agents previously.   Expected Discharge Date:  04/25/17               Expected Discharge Plan:  [place]  In-House Referral:  NA  Discharge planning Services  CM Consult  Post Acute Care Choice:  Home Health Choice offered to:  Patient  DME Arranged:    DME Agency:     HH Arranged:    [place] Agency:     Status of Service:  In process, will continue to follow  If discussed at Long Length of Stay Meetings, dates discussed:    Additional Comments:  Adron Bene, RN 04/23/2017, 2:52 PM

## 2017-04-23 NOTE — Care Management Obs Status (Signed)
[place] NOTIFICATION   Patient Details  Name: [name] May MRN: [id number] Date of Birth: May 05, 1941   Medicare Observation Status Notification Given:  Yes    Roy Tokarz, Rory Percy, RN 04/23/2017, 12:09 PM

## 2017-04-23 NOTE — Progress Notes (Signed)
After doppler, technician called and stated that patient had a DVT throughout her L leg. MD notifed. Will continue to monitor.

## 2017-04-23 NOTE — Progress Notes (Signed)
Arrival Method: Patient arrived in stretcher from ED. Mental Orientation: alert/oriented  X 4 Telemetry:28 Assessment: See Doc Flow sheets. Skin: Warm, dry and intact. IV: Peripheral  Left forearm Pain:.rate x 2 Fall Prevention Safety Plan: Patient educated about fall prevention safety plan, understood and acknowledged. Admission Screening:6700 Orientation: Patient has been oriented to the unit, staff and to the room.

## 2017-04-23 NOTE — H&P (Signed)
History and Physical    [name] IEP:[id number] DOB: 1941-01-20 DOA: 04/22/2017  PCP: Jefm Petty, MD  Patient coming from: Home.  Chief Complaint: Left lower extremity swelling.  HPI: [name] May is a 76 y.o. 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 Denies any trauma or fall. Denies any chest pain or shortness of breath. Patient did take 2 doses of Lasix on seeing the swelling 2 days ago.  ED Course: On exam patient has significant swelling extending up to the left thigh. Patient's creatinine has increased from baseline last. Patient was given Lovenox and admitted for further management of possible DVT and also acute renal failure. Patient was given fluid bolus for the acute renal failure.  Review of Systems: As per HPI, rest all negative.   Past Medical History:  Diagnosis Date  . Allergy   . Anemia   . Back pain   . Bipolar disorder ([place])   . Chronic headaches   . Degenerative disorder of bone   . Depression   . Esophageal ulcer   . Hyperlipidemia   . Pulmonary embolism ([place])   . Scoliosis     Past Surgical History:  Procedure Laterality Date  . ABDOMINAL HYSTERECTOMY    . ANKLE SURGERY    . LAMINECTOMY    . NASAL SINUS SURGERY       reports that she has quit smoking. She has never used smokeless tobacco. She reports that she does not drink alcohol or use drugs.  Allergies  Allergen Reactions  . Latex Itching and Rash    Blisters for months on legs  . Mold Extract [Trichophyton] Anaphylaxis, Swelling and Rash    Family History  Problem Relation Age of Onset  . Deep vein thrombosis Neg Hx     Prior to Admission medications   Medication Sig Start Date End Date Taking? Authorizing Provider    ascorbic acid (VITAMIN C) 250 MG tablet Take 250 mg by mouth 2 (two) times daily.   Yes [provider]  aspirin EC 325 MG tablet Take 325 mg by mouth at bedtime.   Yes [provider]  buPROPion (WELLBUTRIN XL) 300 MG 24 hr tablet Take 300 mg by mouth daily.  02/25/14  Yes [provider]  Calcium Carbonate-Vitamin D (CALCIUM + D PO) Take 1 tablet by mouth 2 (two) times daily.    Yes [provider]  dicyclomine (BENTYL) 20 MG tablet Take 20 mg by mouth every 6 (six) hours as needed for spasms.   Yes [provider]  furosemide (LASIX) 20 MG tablet Take 20 mg by mouth daily as needed for fluid or edema.    Yes [provider]  gabapentin (NEURONTIN) 600 MG tablet Take 600 mg by mouth 3 (three) times daily. AT 1500, 1800, and 2100 02/25/14  Yes [provider]  hydrocortisone 2.5 % cream Apply 1 application topically daily as needed (eczema).  01/06/14  Yes [provider]  ibuprofen (ADVIL,MOTRIN) 200 MG tablet Take 800 mg by mouth every 6 (six) hours as needed (for pain).   Yes [provider]  iron polysaccharides (NU-IRON) 150 MG capsule Take 150 mg by mouth daily.   Yes [provider]  lamoTRIgine (LAMICTAL) 200 MG  tablet Take 200 mg by mouth at bedtime.  02/20/14  Yes [provider]  loperamide (IMODIUM A-D) 2 MG tablet Take 4-6 mg by mouth daily as needed for diarrhea or loose stools.   Yes [provider]  loratadine (CLARITIN) 10 MG tablet Take 10 mg by mouth daily as needed (for seasonal allergies).    Yes [provider]  LORazepam (ATIVAN) 0.5 MG tablet Take 0.5 mg by mouth 2 (two) times daily.   Yes [provider]  pantoprazole (PROTONIX) 40 MG tablet Take 40 mg by mouth 2 (two) times daily as needed (acid reflux).  01/24/14  Yes [provider]  rOPINIRole (REQUIP) 1 MG tablet Take 1 mg by mouth every morning.  12/15/14  Yes [provider]   rOPINIRole (REQUIP) 2 MG tablet Take 2 mg by mouth 3 (three) times daily. AT 1500, 1800, and 2100 02/25/14  Yes [provider]  zolpidem (AMBIEN) 5 MG tablet Take 5 mg by mouth at bedtime as needed for sleep.  01/31/15  Yes [provider]  HYDROcodone-acetaminophen (NORCO/VICODIN) 5-325 MG per tablet Take 1-2 tablets by mouth every 6 (six) hours as needed for moderate pain. Patient not taking: Reported on 04/22/2017 02/15/15   Orson Eva, MD  oxyCODONE-acetaminophen (PERCOCET/ROXICET) 5-325 MG per tablet Take 1 tablet by mouth every 6 (six) hours as needed for severe pain. Patient not taking: Reported on 04/22/2017 05/26/15   Dalia Heading, PA-C  warfarin (COUMADIN) 5 MG tablet Take 1.5 tablets (7.5 mg total) by mouth daily at 6 PM. On 02/15/15 and on 02/16/15.  Then take 5mg  on 02/17/15 until seen by MD on 02/18/15. Patient not taking: Reported on 04/22/2017 02/15/15   Orson Eva, MD    Physical Exam: Vitals:   04/22/17 2200 04/22/17 2230 04/22/17 2300 04/22/17 2330  BP:  (!) 125/94 (!) 103/54 111/81  Pulse:  90 87   Resp:      Temp:      TempSrc:      SpO2:  92% 95%   Weight: 68 kg (150 lb)         Constitutional: Moderately built and nourished. Vitals:   04/22/17 2200 04/22/17 2230 04/22/17 2300 04/22/17 2330  BP:  (!) 125/94 (!) 103/54 111/81  Pulse:  90 87   Resp:      Temp:      TempSrc:      SpO2:  92% 95%   Weight: 68 kg (150 lb)      Eyes: Anicteric no pallor. ENMT: No discharge from the ears eyes nose and mouth. Neck: No mass felt. No JVD appreciated. Respiratory: No rhonchi or crepitations. Cardiovascular: S1-S2 heard no murmurs appreciated. Abdomen: Soft nontender bowel sounds present. Musculoskeletal: Swelling extending from the left lower extremity to the thigh. Skin: Mild erythema of the left lower extremity. Neurologic: Alert awake oriented to time place and person. Moves all extremities.  Psychiatric: Appears normal. Normal affect.   Labs on  Admission: I have personally reviewed following labs and imaging studies  CBC:  Recent Labs Lab 04/22/17 1824  WBC 8.2  HGB 9.5*  HCT 28.9*  MCV 94.8  PLT 784   Basic Metabolic Panel:  Recent Labs Lab 04/22/17 1824  NA 134*  K 3.6  CL 100*  CO2 22  GLUCOSE 92  BUN 50*  CREATININE 2.82*  CALCIUM 8.9   GFR: CrCl cannot be calculated (Unknown ideal weight.). Liver Function Tests: No results for input(s): AST, ALT, ALKPHOS, BILITOT, PROT, ALBUMIN  in the last 168 hours. No results for input(s): LIPASE, AMYLASE in the last 168 hours. No results for input(s): AMMONIA in the last 168 hours. Coagulation Profile: No results for input(s): INR, PROTIME in the last 168 hours. Cardiac Enzymes: No results for input(s): CKTOTAL, CKMB, CKMBINDEX, TROPONINI in the last 168 hours. BNP (last 3 results) No results for input(s): PROBNP in the last 8760 hours. HbA1C: No results for input(s): HGBA1C in the last 72 hours. CBG: No results for input(s): GLUCAP in the last 168 hours. Lipid Profile: No results for input(s): CHOL, HDL, LDLCALC, TRIG, CHOLHDL, LDLDIRECT in the last 72 hours. Thyroid Function Tests: No results for input(s): TSH, T4TOTAL, FREET4, T3FREE, THYROIDAB in the last 72 hours. Anemia Panel: No results for input(s): VITAMINB12, FOLATE, FERRITIN, TIBC, IRON, RETICCTPCT in the last 72 hours. Urine analysis:    Component Value Date/Time   COLORURINE YELLOW 04/22/2017 2039   APPEARANCEUR CLEAR 04/22/2017 2039   LABSPEC 1.010 04/22/2017 2039   PHURINE 5.0 04/22/2017 2039   GLUCOSEU NEGATIVE 04/22/2017 2039   HGBUR SMALL (A) 04/22/2017 2039   BILIRUBINUR NEGATIVE 04/22/2017 2039   [place] NEGATIVE 04/22/2017 2039   PROTEINUR NEGATIVE 04/22/2017 2039   NITRITE NEGATIVE 04/22/2017 2039   LEUKOCYTESUR LARGE (A) 04/22/2017 2039   Sepsis Labs: @LABRCNTIP (procalcitonin:4,lacticidven:4) )No results found for this or any previous visit (from the past 240 hour(s)).    Radiological Exams on Admission: Dg Chest 2 View  Result Date: 04/22/2017 CLINICAL DATA:  Left leg swelling. EXAM: CHEST  2 VIEW COMPARISON:  02/06/2015 as well as chest CT 05/26/2015 FINDINGS: Patient is rotated to the right. Lungs are adequately inflated without focal consolidation or effusion. Eventration of the lateral right hemidiaphragm unchanged. Mild stable cardiomegaly. Moderate size hiatal hernia. Calcified plaque over the thoracic aorta. Moderate curvature of the thoracic spine convex right unchanged diffuse osteopenia present. Degenerative change of the spine. IVC filter is present. IMPRESSION: No acute cardiopulmonary disease. Stable cardiomegaly. Moderate size hiatal hernia. Aortic atherosclerosis. Electronically Signed   By: Marin Olp M.D.   On: 04/22/2017 19:08   US Renal  Result Date: 04/22/2017 CLINICAL DATA:  Acute renal injury EXAM: RENAL / URINARY TRACT ULTRASOUND COMPLETE COMPARISON:  None. FINDINGS: Right Kidney: Length: 9.9 cm. Echogenicity within normal limits. No mass or hydronephrosis visualized. Slight cortical thinning of the right kidney. Left Kidney: Length: 8.8 cm. Echogenicity within normal limits. No mass or hydronephrosis visualized. Bladder: Appears normal for degree of bladder distention. IMPRESSION: No obstructive uropathy of either kidney. Slight cortical thinning of the right kidney. Maintained cortical-medullary differentiation is noted within both kidneys. Electronically Signed   By: Ashley Royalty M.D.   On: 04/22/2017 21:37     Assessment/Plan Principal Problem:   ARF (acute renal failure) (HCC) Active Problems:   Normocytic anemia   Edema of left lower extremity    1. 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 Check Dopplers of the lower extremity. 2. Acute renal failure - will hydrate patient at this time and  recheck metabolic panel in a.m. Check FENa. Hold Lasix. 3. Iron deficiency anemia on iron supplements. Follow CBC. 4. Restless leg syndrome on Requip.   DVT prophylaxis: Lovenox. Code Status: Full code.  Family Communication: Discussed with patient.  Disposition Plan: Home.  Consults called: None.  Admission status: Observation.    Rise Patience MD [facility] Pager  [place] [phone].  If 7PM-7AM, please contact night-coverage [email or web address] Password [facility]  04/23/2017, 12:34 AM

## 2017-04-23 NOTE — Progress Notes (Signed)
*  PRELIMINARY RESULTS* Vascular Ultrasound Bilateral lower extremity venous duplex has been completed.  Preliminary findings: right lower extremity is negative for deep vein thrombosis, the left lower extremity appears positive for acute deep vein thrombosis from the left iliac vein, common femoral, femoral, popliteal and calf veins.  Attempt was made to visualize the IVC, obscured by bowel and gas.   If needed order an IVC/Iliac study with patient being NPO 6-8 hours in advance. Preliminary results called to nurse, [place] 04/23/2017, 2:03 PM

## 2017-04-23 NOTE — Progress Notes (Signed)
PROGRESS NOTE                                                                                                                                                                                                             Patient Demographics:    [name] May, is a 76 y.o. female, DOB - 12/20/1940, ION:[id number]  Admit date - 04/22/2017   Admitting Physician Rise Patience, MD  Outpatient Primary MD for the patient is [name] Petty, MD  LOS - 0  Outpatient Specialists: hematology Dr Cruzita Lede[facility] at [facility]  Chief Complaint  Patient presents with  . Leg Swelling       Brief Narrative   This is a no charge note for patient dictated earlier today by Dr. Hal Hope, chart, labs and imaging were reviewed.  76 y.o. 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 Venous Doppler significant for DVT, she is on heparin GTT for anticoagulation.   Subjective:    [name] May today has, No headache, No chest pain, No abdominal pain - Reports she feels the swelling in left occiput is improving, now at least she is able to bend her knees .   Assessment  & Plan :    Principal Problem:   ARF (acute renal failure) (HCC) Active Problems:   Normocytic anemia   Edema of left lower extremity   Left lower Ext DVT - Prelim report with left lower extremity DVT on ultrasound, patient with history of recurrent DVTs in the past, status post IVC filter, her anticoagulation has been stopped in the past secondary to GI bleed, she has followed by hematologist Dr. Cruzita Lederer.. , he recommends to discharge her on Lovenox for 3 month, does not recommend DOAC currently has she had GI bleeding secondary to Xarelto in the past  , he will schedule to see her in the office in 3-4 weeks, and then he will decide about further anticoagulation from there.  Acute renal failure  - will hydrate patient at this time and recheck metabolic panel in a.m. Check FENa. Hold Lasix.  Iron deficiency anemia on iron  supplements. -  Follow CBC.  Restless leg syndrome - on Requip    Code Status : Full  Family Communication  : Discussed with patient  Disposition Plan  : Home when stable  Consults  :  Discussed with patient primary hematologist Dr. Cruzita Lederer at [facility] via phone  DVT Prophylaxis  :  Lovenox   Lab Results  Component Value Date   PLT 218 04/23/2017    Antibiotics  :    Anti-infectives    None        Objective:   Vitals:   04/22/17 2330 04/23/17 0110 04/23/17 0406 04/23/17 0955  BP: 111/81 (!) 121/58 (!) 98/47 (!) 101/55  Pulse:  98 86 89  Resp:  18 18 18   Temp:  98 F (36.7 C) 98.4 F (36.9 C) 98.1 F (36.7 C)  TempSrc:  Oral Oral Oral  SpO2:  94% 95% 96%  Weight:  67.2 kg (148 lb 3.2 oz)      Wt Readings from Last 3 Encounters:  04/23/17 67.2 kg (148 lb 3.2 oz)  02/06/15 78.1 kg (172 lb 2.9 oz)  05/27/14 72.6 kg (160 lb)     Intake/Output Summary (Last 24 hours) at 04/23/17 1429 Last data filed at 04/23/17 1424  Gross per 24 hour  Intake          2041.25 ml  Output              925 ml  Net          1116.25 ml     Physical Exam  Awake Alert, Oriented X 3,  Symmetrical Chest wall movement, Good air movement bilaterally, CTAB RRR,No Gallops,Rubs or new Murmurs, No Parasternal Heave +ve B.Sounds, Abd Soft, No tenderness,  No rebound - guarding or rigidity. No Cyanosis, +3 left lower extremity edema, No new Rash or bruise      Data Review:    CBC  Recent Labs Lab 04/22/17 1824 04/23/17 0547  WBC 8.2 7.3  HGB 9.5* 8.8*  HCT 28.9* 27.0*  PLT 220 218  MCV 94.8 95.1  MCH 31.1 31.0  MCHC 32.9 32.6  RDW 13.0 13.1    Chemistries   Recent Labs Lab 04/22/17 1824  04/23/17 0547  NA 134* 137  K 3.6 3.0*  CL 100* 106  CO2 22 23  GLUCOSE 92 94  BUN 50* 39*  CREATININE 2.82* 1.70*  CALCIUM 8.9 8.4*   ------------------------------------------------------------------------------------------------------------------ No results for input(s): CHOL, HDL, LDLCALC, TRIG, CHOLHDL, LDLDIRECT in the last 72 hours.  No results found for: HGBA1C ------------------------------------------------------------------------------------------------------------------ No results for input(s): TSH, T4TOTAL, T3FREE, THYROIDAB in the last 72 hours.  Invalid input(s): FREET3 ------------------------------------------------------------------------------------------------------------------ No results for input(s): VITAMINB12, FOLATE, FERRITIN, TIBC, IRON, RETICCTPCT in the last 72 hours.  Coagulation profile No results for input(s): INR, PROTIME in the last 168 hours.  No results for input(s): DDIMER in the last 72 hours.  Cardiac Enzymes No results for input(s): CKMB, TROPONINI, MYOGLOBIN in the last 168 hours.  Invalid input(s): CK ------------------------------------------------------------------------------------------------------------------    Component Value Date/Time   BNP 693.7 (H) 02/06/2015 1010    Inpatient Medications  Scheduled Meds: . buPROPion  300 mg Oral Daily  . enoxaparin (LOVENOX) injection  1 mg/kg Subcutaneous Q12H  . gabapentin  600 mg Oral 3 times per day  . iron polysaccharides  150 mg Oral Daily  . lamoTRIgine  200 mg Oral QHS  . LORazepam  0.5 mg Oral BID  . rOPINIRole  2 mg Oral 3  times per day  . ascorbic acid  250 mg Oral BID   Continuous Infusions: . sodium chloride 75 mL/hr at 04/23/17 1426   PRN Meds:.acetaminophen **OR** acetaminophen, dicyclomine, loratadine, ondansetron **OR** ondansetron (ZOFRAN) IV, pantoprazole, zolpidem  Micro Results No results found for this or any previous visit (from the past 240  hour(s)).  Radiology Reports Dg Chest 2 View  Result Date: 04/22/2017 CLINICAL DATA:  Left leg swelling. EXAM: CHEST  2 VIEW COMPARISON:  02/06/2015 as well as chest CT 05/26/2015 FINDINGS: Patient is rotated to the right. Lungs are adequately inflated without focal consolidation or effusion. Eventration of the lateral right hemidiaphragm unchanged. Mild stable cardiomegaly. Moderate size hiatal hernia. Calcified plaque over the thoracic aorta. Moderate curvature of the thoracic spine convex right unchanged diffuse osteopenia present. Degenerative change of the spine. IVC filter is present. IMPRESSION: No acute cardiopulmonary disease. Stable cardiomegaly. Moderate size hiatal hernia. Aortic atherosclerosis. Electronically Signed   By: Marin Olp M.D.   On: 04/22/2017 19:08   US Renal  Result Date: 04/22/2017 CLINICAL DATA:  Acute renal injury EXAM: RENAL / URINARY TRACT ULTRASOUND COMPLETE COMPARISON:  None. FINDINGS: Right Kidney: Length: 9.9 cm. Echogenicity within normal limits. No mass or hydronephrosis visualized. Slight cortical thinning of the right kidney. Left Kidney: Length: 8.8 cm. Echogenicity within normal limits. No mass or hydronephrosis visualized. Bladder: Appears normal for degree of bladder distention. IMPRESSION: No obstructive uropathy of either kidney. Slight cortical thinning of the right kidney. Maintained cortical-medullary differentiation is noted within both kidneys. Electronically Signed   By: Ashley Royalty M.D.   On: 04/22/2017 21:37    Time Spent in minutes  : no charge   [facility], Terecia Plaut M.D on 04/23/2017 at 2:29 PM  Between 7am to 7pm - Pager - [phone]  After 7pm go to [email or web address] - password [facility]  [facility] -  Office  [phone]

## 2017-04-24 DIAGNOSIS — D649 Anemia, unspecified: Secondary | ICD-10-CM

## 2017-04-24 DIAGNOSIS — N179 Acute kidney failure, unspecified: Secondary | ICD-10-CM

## 2017-04-24 DIAGNOSIS — I82402 Acute embolism and thrombosis of unspecified deep veins of left lower extremity: Secondary | ICD-10-CM

## 2017-04-24 DIAGNOSIS — R600 Localized edema: Secondary | ICD-10-CM

## 2017-04-24 LAB — BASIC METABOLIC PANEL
Anion gap: 8 (ref 5–15)
BUN: 22 mg/dL — AB (ref 6–20)
CO2: 24 mmol/L (ref 22–32)
CREATININE: 0.98 mg/dL (ref 0.44–1.00)
Calcium: 8.2 mg/dL — ABNORMAL LOW (ref 8.9–10.3)
Chloride: 104 mmol/L (ref 101–111)
GFR calc Af Amer: >60 mL/min (ref >60)
GFR, EST NON AFRICAN AMERICAN: 55 mL/min — AB (ref >60)
GLUCOSE: 85 mg/dL (ref 65–99)
Potassium: 3.2 mmol/L — ABNORMAL LOW (ref 3.5–5.1)
SODIUM: 136 mmol/L (ref 135–145)

## 2017-04-24 MED ORDER — ENOXAPARIN SODIUM 80 MG/0.8ML ~~LOC~~ SOLN: 70.0000 mg | Freq: Two times a day (BID) | SUBCUTANEOUS | 0 refills | Status: DC

## 2017-04-24 MED ORDER — POTASSIUM CHLORIDE CRYS ER 20 MEQ PO TBCR
40.0000 meq | EXTENDED_RELEASE_TABLET | Freq: Four times a day (QID) | ORAL | Status: AC
Start: 2017-04-24 — End: 2017-04-24
  Administered 2017-04-24 (×2): 40 meq via ORAL
  Filled 2017-04-24 (×2): qty 2

## 2017-04-24 NOTE — Discharge Summary (Signed)
[name] May HEN:[id number] DOB: Apr 01, 1941 DOA: 04/22/2017  PCP: Jefm Petty, MD  Admit date: 04/22/2017  Discharge date: 04/24/2017  Admitted From: Home   Disposition:  Home   Recommendations for Outpatient Follow-up:   Follow up with PCP in 1-2 weeks  PCP Please obtain BMP/CBC, 2 view CXR in 1week,  (see Discharge instructions)   PCP Please follow up on the following pending results: Needs close follow-up with hematology, patient getting 30 day supply of Lovenox thereafter PCP to provide further supplies   Home Health: None  Equipment/Devices: None  Consultations: [facility] hematologist Dr. Driscilla Moats by previous physician over the phone Discharge Condition: Stable   CODE STATUS: Full   Diet Recommendation:  Heart Healthy    Chief Complaint  Patient presents with  . 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 Venous Doppler significant for DVT, she is on heparin GTT for anticoagulation.                                                                 Hospital Course    Left lower Ext DVT  This has been a recurrent problem, has another left lower extremity large acute DVT, in the past her anti-coloration and had to be stopped due to GI bleeding issues, previous physician discussed her case with patient's hematologist at [facility] Dr. Cruzita Lederer who recommended patient be placed on Lovenox for at least 3 months, she will be provided with 1  month of Lovenox supply and discharged home with outpatient PCP and hematology follow-up. Request PCP to please take care of future Lovenox supplies, patient's lower extremity swelling on the left side has significantly improved since she has been started on Lovenox, she has no chest pain or shortness of breath and there is no clinical suspicion of PE.  Note patient is on aspirin and twice a day PPI as well, PCP to monitor H&H closely and also the need for aspirin based on her CAD risk profile.   Acute renal failure  Resolved after hydration replaced on home medications patient's PCP to monitor BMP.  Iron deficiency anemia on iron supplements. -  Follow CBC by PCP as she has history of GI bleed in the past and now on Lovenox, continue twice a day PPI.  Restless leg syndrome - on Requip   Discharge diagnosis     Principal Problem:   ARF (acute renal failure) (HCC) Active Problems:  Normocytic anemia   Edema of left lower extremity   AKI (acute kidney injury) [facility])    Discharge instructions    Discharge Instructions    Diet - low sodium heart healthy    Complete by:  As directed    Discharge instructions    Complete by:  As directed    Follow with Primary MD Jefm Petty, MD in 3 days and your hematologist within 1 week   Get CBC, CMP, 2 view Chest X ray checked  by Primary MD  in 3 days ( we routinely change or add medications that can affect your baseline labs and fluid status, therefore we recommend that you get the mentioned basic workup next visit with your PCP, your PCP may decide not to get them or add new tests based on their clinical decision)  Activity: As tolerated with Full fall precautions use walker/cane & assistance as needed  Disposition Home    Diet: Heart Healthy    For Heart failure patients - Check your Weight same time everyday, if you gain over 2 pounds, or you develop in leg swelling, experience more shortness of breath or chest pain, call your  Primary MD immediately. Follow Cardiac Low Salt Diet and 1.5 lit/day fluid restriction.  On your next visit with your primary care physician please Get Medicines reviewed and adjusted.  Please request your Prim.MD to go over all Hospital Tests and Procedure/Radiological results at the follow up, please get all Hospital records sent to your Prim MD by signing hospital release before you go home.  If you experience worsening of your admission symptoms, develop shortness of breath, life threatening emergency, suicidal or homicidal thoughts you must seek medical attention immediately by calling 911 or calling your MD immediately  if symptoms less severe.  You Must read complete instructions/literature along with all the possible adverse reactions/side effects for all the Medicines you take and that have been prescribed to you. Take any new Medicines after you have completely understood and accpet all the possible adverse reactions/side effects.   Do not drive, operate heavy machinery, perform activities at heights, swimming or participation in water activities or provide baby sitting services if your were admitted for syncope or siezures until you have seen by Primary MD or a Neurologist and advised to do so again.  Do not drive when taking Pain medications.    Do not take more than prescribed Pain, Sleep and Anxiety Medications  Special Instructions: If you have smoked or chewed Tobacco  in the last 2 yrs please stop smoking, stop any regular Alcohol  and or any Recreational drug use.  Wear Seat belts while driving.   Please note  You were cared for by a hospitalist during your hospital stay. If you have any questions about your discharge medications or the care you received while you were in the hospital after you are discharged, you can call the unit and asked to speak with the hospitalist on call if the hospitalist that took care of you is not available. Once you are discharged, your primary  care physician will handle any further medical issues. Please note that NO REFILLS for any discharge medications will be authorized once you are discharged, as it is imperative that you return to your primary care physician (or establish a relationship with a primary care physician if you do not have one) for your aftercare needs so that they can reassess your need for medications and monitor your lab values.   Increase activity  slowly    Complete by:  As directed       Discharge Medications   Allergies as of 04/24/2017      Reactions   Latex Itching, Rash   Blisters for months on legs   Mold Extract [trichophyton] Anaphylaxis, Swelling, Rash      Medication List    STOP taking these medications   ibuprofen 200 MG tablet Commonly known as:  ADVIL,MOTRIN     TAKE these medications   ascorbic acid 250 MG tablet Commonly known as:  VITAMIN C Take 250 mg by mouth 2 (two) times daily.   aspirin EC 325 MG tablet Take 325 mg by mouth at bedtime.   buPROPion 300 MG 24 hr tablet Commonly known as:  WELLBUTRIN XL Take 300 mg by mouth daily.   CALCIUM + D PO Take 1 tablet by mouth 2 (two) times daily.   dicyclomine 20 MG tablet Commonly known as:  BENTYL Take 20 mg by mouth every 6 (six) hours as needed for spasms.   enoxaparin 80 MG/0.8ML injection Commonly known as:  LOVENOX Inject 0.7 mLs (70 mg total) into the skin every 12 (twelve) hours.   furosemide 20 MG tablet Commonly known as:  LASIX Take 20 mg by mouth daily as needed for fluid or edema.   gabapentin 600 MG tablet Commonly known as:  NEURONTIN Take 600 mg by mouth 3 (three) times daily. AT 1500, 1800, and 2100   hydrocortisone 2.5 % cream Apply 1 application topically daily as needed (eczema).   lamoTRIgine 200 MG tablet Commonly known as:  LAMICTAL Take 200 mg by mouth at bedtime.   loperamide 2 MG tablet Commonly known as:  IMODIUM A-D Take 4-6 mg by mouth daily as needed for diarrhea or loose stools.    loratadine 10 MG tablet Commonly known as:  CLARITIN Take 10 mg by mouth daily as needed (for seasonal allergies).   LORazepam 0.5 MG tablet Commonly known as:  ATIVAN Take 0.5 mg by mouth 2 (two) times daily.   NU-IRON 150 MG capsule Generic drug:  iron polysaccharides Take 150 mg by mouth daily.   pantoprazole 40 MG tablet Commonly known as:  PROTONIX Take 40 mg by mouth 2 (two) times daily as needed (acid reflux).   rOPINIRole 2 MG tablet Commonly known as:  REQUIP Take 2 mg by mouth 3 (three) times daily. AT 1500, 1800, and 2100   rOPINIRole 1 MG tablet Commonly known as:  REQUIP Take 1 mg by mouth every morning.   zolpidem 5 MG tablet Commonly known as:  AMBIEN Take 5 mg by mouth at bedtime as needed for sleep.       Follow-up Information    Jefm Petty, MD. Schedule an appointment as soon as possible for a visit in 3 day(s).   Specialty:  Family Medicine Contact information: [address] [place] [address] [phone]        Ardeth Sportsman., MD. Schedule an appointment as soon as possible for a visit in 1 week(s).   Specialty:  Hematology and Oncology Contact information: [address] [place] [address] [phone]           Major procedures and Radiology Reports - PLEASE review detailed and final reports thoroughly  -     Vascular Ultrasound Bilateral lower extremity venous duplex has been completed.  Preliminary findings: right lower extremity is negative for deep vein thrombosis, the left lower extremity appears positive for acute deep vein  thrombosis from the left iliac vein, common femoral, femoral, popliteal and calf veins.  Attempt was made to visualize the IVC, obscured by bowel and gas.   If needed order an IVC/Iliac study with patient being NPO 6-8 hours in advance.      Dg Chest 2 View  Result Date: 04/22/2017 CLINICAL DATA:  Left leg swelling. EXAM: CHEST  2 VIEW COMPARISON:  02/06/2015 as well as chest  CT 05/26/2015 FINDINGS: Patient is rotated to the right. Lungs are adequately inflated without focal consolidation or effusion. Eventration of the lateral right hemidiaphragm unchanged. Mild stable cardiomegaly. Moderate size hiatal hernia. Calcified plaque over the thoracic aorta. Moderate curvature of the thoracic spine convex right unchanged diffuse osteopenia present. Degenerative change of the spine. IVC filter is present. IMPRESSION: No acute cardiopulmonary disease. Stable cardiomegaly. Moderate size hiatal hernia. Aortic atherosclerosis. Electronically Signed   By: Marin Olp M.D.   On: 04/22/2017 19:08   US Renal  Result Date: 04/22/2017 CLINICAL DATA:  Acute renal injury EXAM: RENAL / URINARY TRACT ULTRASOUND COMPLETE COMPARISON:  None. FINDINGS: Right Kidney: Length: 9.9 cm. Echogenicity within normal limits. No mass or hydronephrosis visualized. Slight cortical thinning of the right kidney. Left Kidney: Length: 8.8 cm. Echogenicity within normal limits. No mass or hydronephrosis visualized. Bladder: Appears normal for degree of bladder distention. IMPRESSION: No obstructive uropathy of either kidney. Slight cortical thinning of the right kidney. Maintained cortical-medullary differentiation is noted within both kidneys. Electronically Signed   By: Ashley Royalty M.D.   On: 04/22/2017 21:37    Micro Results     No results found for this or any previous visit (from the past 240 hour(s)).  Today   Subjective    [name] May today has no headache,no chest abdominal pain,no new weakness tingling or numbness, feels much better wants to go home today.     Objective   Blood pressure 103/69, pulse 79, temperature 98.3 F (36.8 C), temperature source Oral, resp. rate 18, height 4\' 9"  (1.448 m), weight 71.9 kg (158 lb 9.6 oz), SpO2 90 %.   Intake/Output Summary (Last 24 hours) at 04/24/17 0959 Last data filed at 04/24/17 0424  Gross per 24 hour  Intake          1696.25 ml  Output              1975 ml  Net          -278.75 ml    Exam Awake Alert, Oriented x 3, No new F.N deficits, Normal affect [place].AT,PERRAL Supple Neck,No JVD, No cervical lymphadenopathy appriciated.  Symmetrical Chest wall movement, Good air movement bilaterally, CTAB RRR,No Gallops,Rubs or new Murmurs, No Parasternal Heave +ve B.Sounds, Abd Soft, Non tender, No organomegaly appriciated, No rebound -guarding or rigidity. No Cyanosis, Clubbing , No new Rash or bruise, Left leg swelling 1+ which has improved significantly per patient   Data Review   CBC w Diff: Lab Results  Component Value Date   WBC 7.3 04/23/2017   HGB 8.8 (L) 04/23/2017   HCT 27.0 (L) 04/23/2017   PLT 218 04/23/2017    CMP: Lab Results  Component Value Date   NA 136 04/24/2017   K 3.2 (L) 04/24/2017   CL 104 04/24/2017   CO2 24 04/24/2017   BUN 22 (H) 04/24/2017   CREATININE 0.98 04/24/2017   PROT 6.7 02/08/2015   ALBUMIN 2.7 (L) 02/08/2015   BILITOT 0.9 02/08/2015   ALKPHOS 74 02/08/2015   AST 28 02/08/2015  ALT 14 02/08/2015  .   Total Time in preparing paper work, data evaluation and todays exam - 79 minutes  Lala Lund M.D on 04/24/2017 at 9:59 AM  [facility]   Office  [phone]

## 2017-04-24 NOTE — Progress Notes (Signed)
Pt reports during shift change that while having her L leg ultrasound performed today that she became alarmed. Pt reports that "the technician was trying to see my IVC filter but it was blocked by poop, so she asked if she could go a little higher on my abdomen. Then I noticed her facial expression changed and she called in another technician to confirm what she was seeing. I'm not sure what it was because she told me that she couldn't do anything because there was no order to ultrasound the abdomen, but I gather that its much worse than I know." Pt educated that we would make  the attending MD aware in the morning for further evaluation if necessary. Pt reports that she does not want to get anyone in trouble. Pt educated that we have a "just" culture and this may be an opportunity to empower the technician in future instances. Dorthey Sawyer, RN

## 2017-04-24 NOTE — Discharge Instructions (Signed)
Follow with Primary MD Jefm Petty, MD in 3 days and your hematologist within 1 week   Get CBC, CMP, 2 view Chest X ray checked  by Primary MD  in 3 days ( we routinely change or add medications that can affect your baseline labs and fluid status, therefore we recommend that you get the mentioned basic workup next visit with your PCP, your PCP may decide not to get them or add new tests based on their clinical decision)  Activity: As tolerated with Full fall precautions use walker/cane & assistance as needed  Disposition Home    Diet: Heart Healthy    For Heart failure patients - Check your Weight same time everyday, if you gain over 2 pounds, or you develop in leg swelling, experience more shortness of breath or chest pain, call your Primary MD immediately. Follow Cardiac Low Salt Diet and 1.5 lit/day fluid restriction.  On your next visit with your primary care physician please Get Medicines reviewed and adjusted.  Please request your Prim.MD to go over all Hospital Tests and Procedure/Radiological results at the follow up, please get all Hospital records sent to your Prim MD by signing hospital release before you go home.  If you experience worsening of your admission symptoms, develop shortness of breath, life threatening emergency, suicidal or homicidal thoughts you must seek medical attention immediately by calling 911 or calling your MD immediately  if symptoms less severe.  You Must read complete instructions/literature along with all the possible adverse reactions/side effects for all the Medicines you take and that have been prescribed to you. Take any new Medicines after you have completely understood and accpet all the possible adverse reactions/side effects.   Do not drive, operate heavy machinery, perform activities at heights, swimming or participation in water activities or provide baby sitting services if your were admitted for syncope or siezures until you have seen by  Primary MD or a Neurologist and advised to do so again.  Do not drive when taking Pain medications.    Do not take more than prescribed Pain, Sleep and Anxiety Medications  Special Instructions: If you have smoked or chewed Tobacco  in the last 2 yrs please stop smoking, stop any regular Alcohol  and or any Recreational drug use.  Wear Seat belts while driving.   Please note  You were cared for by a hospitalist during your hospital stay. If you have any questions about your discharge medications or the care you received while you were in the hospital after you are discharged, you can call the unit and asked to speak with the hospitalist on call if the hospitalist that took care of you is not available. Once you are discharged, your primary care physician will handle any further medical issues. Please note that NO REFILLS for any discharge medications will be authorized once you are discharged, as it is imperative that you return to your primary care physician (or establish a relationship with a primary care physician if you do not have one) for your aftercare needs so that they can reassess your need for medications and monitor your lab values.

## 2017-04-30 ENCOUNTER — Emergency Department (HOSPITAL_COMMUNITY): Payer: Medicare HMO

## 2017-04-30 ENCOUNTER — Inpatient Hospital Stay (HOSPITAL_COMMUNITY)
Admission: EM | Admit: 2017-04-30 | Discharge: 2017-05-03 | DRG: 300 | Disposition: A | Discharge status: Home / Self Care | Payer: Medicare HMO | Attending: Internal Medicine | Admitting: Internal Medicine

## 2017-04-30 ENCOUNTER — Encounter (HOSPITAL_COMMUNITY): Payer: Self-pay | Admitting: Emergency Medicine

## 2017-04-30 DIAGNOSIS — L03116 Cellulitis of left lower limb: Secondary | ICD-10-CM | POA: Diagnosis not present

## 2017-04-30 DIAGNOSIS — I82421 Acute embolism and thrombosis of right iliac vein: Secondary | ICD-10-CM | POA: Diagnosis present

## 2017-04-30 DIAGNOSIS — Z881 Allergy status to other antibiotic agents status: Secondary | ICD-10-CM

## 2017-04-30 DIAGNOSIS — I82409 Acute embolism and thrombosis of unspecified deep veins of unspecified lower extremity: Secondary | ICD-10-CM | POA: Diagnosis present

## 2017-04-30 DIAGNOSIS — Z7901 Long term (current) use of anticoagulants: Secondary | ICD-10-CM

## 2017-04-30 DIAGNOSIS — G894 Chronic pain syndrome: Secondary | ICD-10-CM | POA: Diagnosis present

## 2017-04-30 DIAGNOSIS — E785 Hyperlipidemia, unspecified: Secondary | ICD-10-CM | POA: Diagnosis present

## 2017-04-30 DIAGNOSIS — I82412 Acute embolism and thrombosis of left femoral vein: Secondary | ICD-10-CM | POA: Diagnosis present

## 2017-04-30 DIAGNOSIS — L0390 Cellulitis, unspecified: Secondary | ICD-10-CM | POA: Diagnosis present

## 2017-04-30 DIAGNOSIS — F329 Major depressive disorder, single episode, unspecified: Secondary | ICD-10-CM | POA: Diagnosis present

## 2017-04-30 DIAGNOSIS — Z7982 Long term (current) use of aspirin: Secondary | ICD-10-CM

## 2017-04-30 DIAGNOSIS — F39 Unspecified mood [affective] disorder: Secondary | ICD-10-CM | POA: Diagnosis present

## 2017-04-30 DIAGNOSIS — Z91040 Latex allergy status: Secondary | ICD-10-CM

## 2017-04-30 DIAGNOSIS — Z87891 Personal history of nicotine dependence: Secondary | ICD-10-CM

## 2017-04-30 DIAGNOSIS — E8770 Fluid overload, unspecified: Secondary | ICD-10-CM | POA: Diagnosis present

## 2017-04-30 DIAGNOSIS — I87022 Postthrombotic syndrome with inflammation of left lower extremity: Secondary | ICD-10-CM | POA: Diagnosis not present

## 2017-04-30 DIAGNOSIS — F419 Anxiety disorder, unspecified: Secondary | ICD-10-CM | POA: Diagnosis present

## 2017-04-30 DIAGNOSIS — R600 Localized edema: Secondary | ICD-10-CM | POA: Diagnosis present

## 2017-04-30 DIAGNOSIS — Z86711 Personal history of pulmonary embolism: Secondary | ICD-10-CM

## 2017-04-30 DIAGNOSIS — Z91048 Other nonmedicinal substance allergy status: Secondary | ICD-10-CM

## 2017-04-30 DIAGNOSIS — D649 Anemia, unspecified: Secondary | ICD-10-CM | POA: Diagnosis present

## 2017-04-30 LAB — BASIC METABOLIC PANEL
Anion gap: 7 (ref 5–15)
BUN: 13 mg/dL (ref 6–20)
CALCIUM: 8.7 mg/dL — AB (ref 8.9–10.3)
CO2: 27 mmol/L (ref 22–32)
CREATININE: 1.1 mg/dL — AB (ref 0.44–1.00)
Chloride: 103 mmol/L (ref 101–111)
GFR calc Af Amer: 56 mL/min — ABNORMAL LOW (ref >60)
GFR, EST NON AFRICAN AMERICAN: 48 mL/min — AB (ref >60)
GLUCOSE: 88 mg/dL (ref 65–99)
Potassium: 4 mmol/L (ref 3.5–5.1)
Sodium: 137 mmol/L (ref 135–145)

## 2017-04-30 LAB — CBC
HEMATOCRIT: 32.1 % — AB (ref 36.0–46.0)
Hemoglobin: 9.9 g/dL — ABNORMAL LOW (ref 12.0–15.0)
MCH: 30.9 pg (ref 26.0–34.0)
MCHC: 30.8 g/dL (ref 30.0–36.0)
MCV: 100.3 fL — ABNORMAL HIGH (ref 78.0–100.0)
PLATELETS: 390 10*3/uL (ref 150–400)
RBC: 3.2 MIL/uL — ABNORMAL LOW (ref 3.87–5.11)
RDW: 13.8 % (ref 11.5–15.5)
WBC: 8 10*3/uL (ref 4.0–10.5)

## 2017-04-30 LAB — BRAIN NATRIURETIC PEPTIDE: B NATRIURETIC PEPTIDE 5: 47.7 pg/mL (ref 0.0–100.0)

## 2017-04-30 MED ORDER — PANTOPRAZOLE SODIUM 40 MG PO TBEC: 40.0000 mg | DELAYED_RELEASE_TABLET | Freq: Two times a day (BID) | ORAL | Status: DC | PRN

## 2017-04-30 MED ORDER — LORATADINE 10 MG PO TABS: 10.0000 mg | ORAL_TABLET | Freq: Every day | ORAL | Status: DC | PRN

## 2017-04-30 MED ORDER — ACETAMINOPHEN 325 MG PO TABS
650.0000 mg | ORAL_TABLET | Freq: Four times a day (QID) | ORAL | Status: DC | PRN
  Administered 2017-05-01: 650 mg via ORAL
  Filled 2017-04-30: qty 2

## 2017-04-30 MED ORDER — ONDANSETRON HCL 4 MG/2ML IJ SOLN: 4.0000 mg | Freq: Four times a day (QID) | INTRAMUSCULAR | Status: DC | PRN

## 2017-04-30 MED ORDER — ACETAMINOPHEN 650 MG RE SUPP: 650.0000 mg | Freq: Four times a day (QID) | RECTAL | Status: DC | PRN

## 2017-04-30 MED ORDER — ROPINIROLE HCL 1 MG PO TABS
1.0000 mg | ORAL_TABLET | Freq: Every day | ORAL | Status: DC
  Administered 2017-05-01: 1 mg via ORAL
  Filled 2017-04-30 (×2): qty 1

## 2017-04-30 MED ORDER — ZOLPIDEM TARTRATE 5 MG PO TABS: 5.0000 mg | ORAL_TABLET | Freq: Every evening | ORAL | Status: DC | PRN

## 2017-04-30 MED ORDER — ASPIRIN EC 325 MG PO TBEC
325.0000 mg | DELAYED_RELEASE_TABLET | Freq: Every day | ORAL | Status: DC
  Administered 2017-05-02: 325 mg via ORAL
  Filled 2017-04-30 (×2): qty 1

## 2017-04-30 MED ORDER — LORAZEPAM 0.5 MG PO TABS
0.5000 mg | ORAL_TABLET | Freq: Two times a day (BID) | ORAL | Status: DC
  Administered 2017-05-01 (×3): 0.5 mg via ORAL
  Filled 2017-04-30 (×4): qty 1

## 2017-04-30 MED ORDER — VANCOMYCIN HCL IN DEXTROSE 1-5 GM/200ML-% IV SOLN
1000.0000 mg | Freq: Once | INTRAVENOUS | Status: AC
  Administered 2017-04-30: 1000 mg via INTRAVENOUS
  Filled 2017-04-30: qty 200

## 2017-04-30 MED ORDER — DICYCLOMINE HCL 20 MG PO TABS: 20.0000 mg | ORAL_TABLET | Freq: Four times a day (QID) | ORAL | Status: DC | PRN

## 2017-04-30 MED ORDER — VANCOMYCIN HCL IN DEXTROSE 1-5 GM/200ML-% IV SOLN: 1000.0000 mg | INTRAVENOUS | Status: DC

## 2017-04-30 MED ORDER — GABAPENTIN 600 MG PO TABS
600.0000 mg | ORAL_TABLET | ORAL | Status: DC
  Administered 2017-05-01 – 2017-05-03 (×8): 600 mg via ORAL
  Filled 2017-04-30 (×7): qty 1

## 2017-04-30 MED ORDER — BUPROPION HCL ER (XL) 150 MG PO TB24
300.0000 mg | ORAL_TABLET | Freq: Every day | ORAL | Status: DC
  Administered 2017-05-01: 300 mg via ORAL
  Filled 2017-04-30 (×2): qty 2

## 2017-04-30 MED ORDER — POLYSACCHARIDE IRON COMPLEX 150 MG PO CAPS
150.0000 mg | ORAL_CAPSULE | Freq: Every day | ORAL | Status: DC
  Administered 2017-05-01 – 2017-05-03 (×3): 150 mg via ORAL
  Filled 2017-04-30 (×3): qty 1

## 2017-04-30 MED ORDER — LAMOTRIGINE 100 MG PO TABS
200.0000 mg | ORAL_TABLET | Freq: Every day | ORAL | Status: DC
Start: 2017-04-30 — End: 2017-05-03
  Administered 2017-05-01 – 2017-05-02 (×3): 200 mg via ORAL
  Filled 2017-04-30 (×3): qty 2

## 2017-04-30 MED ORDER — ROPINIROLE HCL 1 MG PO TABS
2.0000 mg | ORAL_TABLET | ORAL | Status: DC
  Administered 2017-05-01 – 2017-05-03 (×8): 2 mg via ORAL
  Filled 2017-04-30 (×7): qty 2

## 2017-04-30 MED ORDER — ENOXAPARIN SODIUM 80 MG/0.8ML ~~LOC~~ SOLN
70.0000 mg | Freq: Two times a day (BID) | SUBCUTANEOUS | Status: DC
  Administered 2017-05-01 – 2017-05-03 (×5): 70 mg via SUBCUTANEOUS
  Filled 2017-04-30 (×5): qty 0.8

## 2017-04-30 MED ORDER — ONDANSETRON HCL 4 MG PO TABS: 4.0000 mg | ORAL_TABLET | Freq: Four times a day (QID) | ORAL | Status: DC | PRN

## 2017-04-30 MED ORDER — VITAMIN C 500 MG PO TABS
250.0000 mg | ORAL_TABLET | Freq: Two times a day (BID) | ORAL | Status: DC
  Administered 2017-05-01: 250 mg via ORAL
  Filled 2017-04-30: qty 1

## 2017-04-30 NOTE — ED Triage Notes (Signed)
Per pt, sent by her doctor for left leg swelling and redness. LLE warm to touch. Recently discharged from this hospital for blood clot in the legs. Pt has not been taking lasix per MD, taking Lovenox.

## 2017-04-30 NOTE — Progress Notes (Addendum)
ANTICOAGULATION CONSULT NOTE - Initial Consult  Pharmacy Consult for Lovenox  Indication: DVT  Allergies  Allergen Reactions  . Latex Itching and Rash    Blisters for months on legs  . Mold Extract [Trichophyton] Anaphylaxis, Swelling and Rash   Patient Measurements: Height: 4\' 9"  (144.8 cm) Weight: 162 lb (73.5 kg) IBW/kg (Calculated) : 38.6  Vital Signs: Temp: 97.3 F (36.3 C) (06/19 2000) Temp Source: Oral (06/19 2000) BP: 149/73 (06/19 2201) Pulse Rate: 88 (06/19 2202)  Labs:  Recent Labs  04/30/17 2018  HGB 9.9*  HCT 32.1*  PLT 390  CREATININE 1.10*    Estimated Creatinine Clearance: 37.3 mL/min (A) (by C-G formula based on SCr of 1.1 mg/dL (H)).   Medical History: Past Medical History:  Diagnosis Date  . Allergy   . Anemia   . Back pain   . Bipolar disorder ([place])   . Chronic headaches   . Degenerative disorder of bone   . Depression   . Esophageal ulcer   . Hyperlipidemia   . Pulmonary embolism ([place])   . Scoliosis     Assessment: 76 y/o F recently discharged on 04/24/17 with LLE DVT, pt currently taking Lovenox 70 mg subcutaneous q12h, reported good compliance with Lovenox, here with cellulitis at site of DVT. Hgb 9.9.   Goal of Therapy:  Monitor platelets by anticoagulation protocol: Yes   Plan:  -Continue Lovenox 70 mg subcutaneous q12h -Minimum q72h CBC while inpatient -Monitor for bleeding  Narda Bonds 04/30/2017,11:27 PM

## 2017-04-30 NOTE — ED Notes (Signed)
Pt presents with redness and swelling to BLE.

## 2017-04-30 NOTE — ED Notes (Signed)
Pt also states that weight has increased in the last few days.

## 2017-04-30 NOTE — H&P (Signed)
History and Physical    [name] May TWS:[id number] DOB: 10-30-1941 DOA: 04/30/2017  PCP: Jefm Petty, MD  Patient coming from: Home.  Chief Complaint: Left leg erythema and pain.  HPI: [name] May is a 76 y.o. female with history of recurrent DVT who was recently admitted for increasing left leg swelling and was found to have a new DVT and was discharged home on Lovenox with previous history of GI bleed being on xarelto has come to the ER because of increasing pain and erythema of the left lower extremity. Patient started noticing blistering of the skin in the left leg and soon became erythematous with fever and chills. Patient did miss her Lovenox injections for 2 days due to insurance issues after discharge last week.  ED Course: In the ER on exam patient has erythema of the left leg. No signs of any ischemia. Given the recent DVT and increasing swelling and pain and erythema patient admitted for further observation and treatment of possible cellulitis.  Review of Systems: As per HPI, rest all negative.   Past Medical History:  Diagnosis Date  . Allergy   . Anemia   . Back pain   . Bipolar disorder ([place])   . Chronic headaches   . Degenerative disorder of bone   . Depression   . Esophageal ulcer   . Hyperlipidemia   . Pulmonary embolism ([place])   . Scoliosis     Past Surgical History:  Procedure Laterality Date  . ABDOMINAL HYSTERECTOMY    . ANKLE SURGERY    . LAMINECTOMY    . NASAL SINUS SURGERY       reports that she has quit smoking. She has never used smokeless tobacco. She reports that she does not drink alcohol or use drugs.  Allergies  Allergen Reactions  . Latex Itching and Rash    Blisters for months on legs  . Mold Extract [Trichophyton] Anaphylaxis, Swelling and Rash    Family History  Problem Relation Age of Onset  . Deep vein thrombosis Neg Hx     Prior to Admission medications   Medication Sig Start Date End Date Taking? Authorizing  Provider  ascorbic acid (VITAMIN C) 250 MG tablet Take 250 mg by mouth 2 (two) times daily.   Yes [provider]  aspirin EC 325 MG tablet Take 325 mg by mouth at bedtime.   Yes [provider]  buPROPion (WELLBUTRIN XL) 300 MG 24 hr tablet Take 300 mg by mouth daily.  02/25/14  Yes [provider]  Calcium Carbonate-Vitamin D (CALCIUM + D PO) Take 1 tablet by mouth 2 (two) times daily.    Yes [provider]  dicyclomine (BENTYL) 20 MG tablet Take 20 mg by mouth every 6 (six) hours as needed for spasms.   Yes [provider]  enoxaparin (LOVENOX) 80 MG/0.8ML injection Inject 0.7 mLs (70 mg total) into the skin every 12 (twelve) hours. 04/24/17  Yes Thurnell Lose, MD  furosemide (LASIX) 20 MG tablet Take 20 mg by mouth daily as needed for fluid or edema.    Yes [provider]  gabapentin (NEURONTIN) 600 MG tablet Take 600 mg by mouth 3 (three) times daily. AT 1500, 1800, and 2100 02/25/14  Yes [provider]  hydrocortisone 2.5 % cream Apply 1 application topically daily as needed (eczema).  01/06/14  Yes [provider]  iron polysaccharides (NU-IRON) 150 MG capsule Take 150 mg by mouth daily.   Yes [provider]  lamoTRIgine (LAMICTAL) 200 MG tablet Take 200 mg by mouth at bedtime.  02/20/14  Yes [provider]  loperamide (IMODIUM A-D) 2 MG tablet Take 4-6 mg by mouth daily as needed for diarrhea or loose stools.   Yes [provider]  loratadine (CLARITIN) 10 MG tablet Take 10 mg by mouth daily as needed (for seasonal allergies).    Yes [provider]  LORazepam (ATIVAN) 0.5 MG tablet Take 0.5 mg by mouth 2 (two) times daily.   Yes [provider]  pantoprazole (PROTONIX) 40 MG tablet Take 40 mg by mouth 2 (two) times daily as needed (acid reflux).  01/24/14  Yes [provider]  rOPINIRole (REQUIP) 1 MG tablet Take 1 mg by mouth every morning.  12/15/14  Yes [provider]  rOPINIRole (REQUIP) 2 MG tablet Take 2 mg by mouth 3 (three) times daily. AT 1500, 1800, and 2100 02/25/14  Yes [provider]  zolpidem (AMBIEN) 5 MG tablet Take 5 mg by mouth at bedtime as needed for sleep.  01/31/15  Yes [provider]    Physical Exam: Vitals:   04/30/17 2000 04/30/17 2002 04/30/17 2201 04/30/17 2202  BP: 115/64  (!) 149/73   Pulse: 89   88  Temp: 97.3 F (36.3 C)     TempSrc: Oral     SpO2: 95%   100%  Weight:  73.5 kg (162 lb)    Height:  4\' 9"  (1.448 m)        Constitutional: Moderately built and nourished. Vitals:   04/30/17 2000 04/30/17 2002 04/30/17 2201 04/30/17 2202  BP: 115/64  (!) 149/73   Pulse: 89   88  Temp: 97.3 F (36.3 C)     TempSrc: Oral     SpO2: 95%   100%  Weight:  73.5 kg (162 lb)    Height:  4\' 9"  (1.448 m)     Eyes: Anicteric no pallor. ENMT: No discharge from the ears eyes nose and mouth. Neck: No mass felt. No neck rigidity. Respiratory: No rhonchi or crepitations. Cardiovascular: S1 and S2 heard no murmurs appreciated. Abdomen: Soft nontender bowel sounds present. Musculoskeletal: Swelling of the left lower extremity extending up to the thigh but more on the left leg below the left knee with erythema. Skin: Erythema below the left leg below the left knee. Neurologic: Alert awake oriented to time place and person. Moves all extremities. Psychiatric: Appears normal. Normal affect.   Labs on Admission: I have personally reviewed following labs and imaging studies  CBC:  Recent Labs Lab 04/30/17 2018  WBC 8.0  HGB 9.9*  HCT 32.1*  MCV 100.3*  PLT 253   Basic Metabolic Panel:  Recent Labs Lab 04/24/17 0431 04/30/17 2018  NA 136 137  K 3.2* 4.0  CL 104 103  CO2 24 27  GLUCOSE 85 88  BUN 22* 13  CREATININE 0.98 1.10*  CALCIUM 8.2* 8.7*   GFR: Estimated Creatinine Clearance: 37.3 mL/min (A) (by C-G formula based on SCr of 1.1 mg/dL (H)). Liver Function Tests: No  results for input(s): AST, ALT, ALKPHOS, BILITOT, PROT, ALBUMIN in the last 168 hours. No results for input(s): LIPASE, AMYLASE in the last 168 hours. No results for input(s): AMMONIA in the last 168 hours. Coagulation Profile: No results for input(s): INR, PROTIME in the last 168 hours. Cardiac Enzymes: No results for input(s): CKTOTAL, CKMB, CKMBINDEX, TROPONINI in the last 168 hours. BNP (last 3 results) No results for input(s): PROBNP in  the last 8760 hours. HbA1C: No results for input(s): HGBA1C in the last 72 hours. CBG: No results for input(s): GLUCAP in the last 168 hours. Lipid Profile: No results for input(s): CHOL, HDL, LDLCALC, TRIG, CHOLHDL, LDLDIRECT in the last 72 hours. Thyroid Function Tests: No results for input(s): TSH, T4TOTAL, FREET4, T3FREE, THYROIDAB in the last 72 hours. Anemia Panel: No results for input(s): VITAMINB12, FOLATE, FERRITIN, TIBC, IRON, RETICCTPCT in the last 72 hours. Urine analysis:    Component Value Date/Time   COLORURINE YELLOW 04/22/2017 2039   APPEARANCEUR CLEAR 04/22/2017 2039   LABSPEC 1.010 04/22/2017 2039   PHURINE 5.0 04/22/2017 2039   GLUCOSEU NEGATIVE 04/22/2017 2039   HGBUR SMALL (A) 04/22/2017 2039   BILIRUBINUR NEGATIVE 04/22/2017 2039   [place] NEGATIVE 04/22/2017 2039   PROTEINUR NEGATIVE 04/22/2017 2039   NITRITE NEGATIVE 04/22/2017 2039   LEUKOCYTESUR LARGE (A) 04/22/2017 2039   Sepsis Labs: @LABRCNTIP (procalcitonin:4,lacticidven:4) )No results found for this or any previous visit (from the past 240 hour(s)).   Radiological Exams on Admission: Dg Chest 2 View  Result Date: 04/30/2017 CLINICAL DATA:  Congestive heart failure. EXAM: CHEST  2 VIEW COMPARISON:  Radiographs of April 22, 2017. FINDINGS: Stable cardiomegaly. Atherosclerosis of thoracic aorta is noted. Severe dextroscoliosis of thoracic spine is noted. No pneumothorax or pleural effusion is noted. Right lung is clear. Mild left basilar atelectasis or  scarring is noted. IMPRESSION: Aortic atherosclerosis. Stable cardiomegaly. Mild left basilar subsegmental atelectasis or scarring. Stable hiatal hernia. Electronically Signed   By: Marijo Conception, M.D.   On: 04/30/2017 21:24     Assessment/Plan Principal Problem:   Cellulitis of left lower extremity Active Problems:   Normocytic anemia   DVT (deep venous thrombosis) (HCC)   Edema of left lower extremity   Cellulitis    1. Left leg cellulitis - no signs of any compartment syndrome at this time. Closely observe. Patient did receive vancomycin for which patient started developing a rash on her hand and was discontinued and placed on doxycycline. 2. Left leg DVT with a history of recurrent DVT and IVC filter placement - on Lovenox. Closely observe for any worsening or swelling and compartment syndrome. 3. History iron deficiency anemia and GI bleed on iron supplements.   DVT prophylaxis: Lovenox. Code Status: Full code.  Family Communication: Patient's daughter.  Disposition Plan: Home.  Consults called: None.  Admission status: Observation.    Rise Patience MD [facility] Pager [phone].  If 7PM-7AM, please contact night-coverage [email or web address] Password [facility]  04/30/2017, 11:21 PM

## 2017-04-30 NOTE — Progress Notes (Signed)
Pharmacy Antibiotic Note  [name] May is a 76 y.o. female admitted on 04/30/2017 with cellulitis.  Pharmacy has been consulted for Vancomycin dosing. Pt recently [place] from hospital on 04/24/17 with LLE DVT for which she has been taking Lovenox. WBC WNL. Renal function ok.   Plan: Vancomycin 1000 mg IV q24h Trend WBC, temp, renal function  F/U infectious work-up Drug levels as indicated   Height: 4\' 9"  (144.8 cm) Weight: 162 lb (73.5 kg) IBW/kg (Calculated) : 38.6  Temp (24hrs), Avg:97.3 F (36.3 C), Min:97.3 F (36.3 C), Max:97.3 F (36.3 C)   Recent Labs Lab 04/24/17 0431 04/30/17 2018  WBC  --  8.0  CREATININE 0.98 1.10*    Estimated Creatinine Clearance: 37.3 mL/min (A) (by C-G formula based on SCr of 1.1 mg/dL (H)).    Allergies  Allergen Reactions  . Latex Itching and Rash    Blisters for months on legs  . Mold Extract [Trichophyton] Anaphylaxis, Swelling and Rash   Narda Bonds 04/30/2017 11:24 PM

## 2017-04-30 NOTE — ED Provider Notes (Signed)
[facility] DEPT Provider Note   CSN: [id number] Arrival date & time: 04/30/17  1919     History   Chief Complaint Chief Complaint  Patient presents with  . Leg Swelling    HPI [name] May is a 76 y.o. female.  HPI  76 y.o. female, presents to the Emergency Department today due to LLE swelling and redness since yesterday. Pt recently DCed from hospital on 04-24-17 due to acute DVT. Pt currently taking Lovenox for this. States weeping from reddened area and painful. Rates pain 5/10. No fevers. No CP/SOB/ABD pain. Pt was sent by PCP for further evaluation. No other symptoms noted.   Past Medical History:  Diagnosis Date  . Allergy   . Anemia   . Back pain   . Bipolar disorder ([place])   . Chronic headaches   . Degenerative disorder of bone   . Depression   . Esophageal ulcer   . Hyperlipidemia   . Pulmonary embolism ([place])   . Scoliosis     Patient Active Problem List   Diagnosis Date Noted  . Edema of left lower extremity 04/23/2017  . AKI (acute kidney injury) ([place]) 04/23/2017  . ARF (acute renal failure) ([place]) 04/22/2017  . DVT of lower extremity, bilateral ([place]) 02/10/2015  . PE (pulmonary embolism)   . Shortness of breath   . Normocytic anemia 02/07/2015  . DVT (deep venous thrombosis) ([place])   . Acute pulmonary embolism ([place]) 02/06/2015  . Scoliosis of thoracic spine 02/06/2015  . GERD (gastroesophageal reflux disease) 02/06/2015  . Acute respiratory failure with hypoxia ([place]) 02/06/2015  . Acute kidney injury ([place]) 02/06/2015  . Elevated troponin 02/06/2015    Past Surgical History:  Procedure Laterality Date  . ABDOMINAL HYSTERECTOMY    . ANKLE SURGERY    . LAMINECTOMY    . NASAL SINUS SURGERY      OB History    No data available       Home Medications    Prior to Admission medications   Medication Sig Start Date End Date Taking? Authorizing Provider  ascorbic acid (VITAMIN C) 250 MG tablet Take 250 mg by mouth 2 (two) times daily.     [provider]  aspirin EC 325 MG tablet Take 325 mg by mouth at bedtime.    [provider]  buPROPion (WELLBUTRIN XL) 300 MG 24 hr tablet Take 300 mg by mouth daily.  02/25/14   [provider]  Calcium Carbonate-Vitamin D (CALCIUM + D PO) Take 1 tablet by mouth 2 (two) times daily.     [provider]  dicyclomine (BENTYL) 20 MG tablet Take 20 mg by mouth every 6 (six) hours as needed for spasms.    [provider]  enoxaparin (LOVENOX) 80 MG/0.8ML injection Inject 0.7 mLs (70 mg total) into the skin every 12 (twelve) hours. 04/24/17   Thurnell Lose, MD  furosemide (LASIX) 20 MG tablet Take 20 mg by mouth daily as needed for fluid or edema.     [provider]  gabapentin (NEURONTIN) 600 MG tablet Take 600 mg by mouth 3 (three) times daily. AT 1500, 1800, and 2100 02/25/14   [provider]  hydrocortisone 2.5 % cream Apply 1 application topically daily as needed (eczema).  01/06/14   [provider]  iron polysaccharides (NU-IRON) 150 MG capsule Take 150 mg by mouth daily.    [provider]  lamoTRIgine (LAMICTAL) 200 MG tablet Take 200 mg by mouth at bedtime.  02/20/14  [provider]  loperamide (IMODIUM A-D) 2 MG tablet Take 4-6 mg by mouth daily as needed for diarrhea or loose stools.    [provider]  loratadine (CLARITIN) 10 MG tablet Take 10 mg by mouth daily as needed (for seasonal allergies).     [provider]  LORazepam (ATIVAN) 0.5 MG tablet Take 0.5 mg by mouth 2 (two) times daily.    [provider]  pantoprazole (PROTONIX) 40 MG tablet Take 40 mg by mouth 2 (two) times daily as needed (acid reflux).  01/24/14   [provider]  rOPINIRole (REQUIP) 1 MG tablet Take 1 mg by mouth every morning.  12/15/14   [provider]  rOPINIRole (REQUIP) 2 MG tablet Take 2 mg by mouth 3 (three) times daily. AT 1500, 1800, and 2100 02/25/14   [provider]  zolpidem (AMBIEN) 5 MG tablet Take 5 mg by mouth at bedtime as needed for sleep.  01/31/15   [provider]    Family History Family History  Problem Relation Age of Onset  . Deep vein thrombosis Neg Hx     Social History Social History  Substance Use Topics  . Smoking status: Former Research scientist (life sciences)  . Smokeless tobacco: Never Used     Comment: quit 22 years ago   . Alcohol use No     Allergies   Latex and Mold extract [trichophyton]   Review of Systems Review of Systems ROS reviewed and all are negative for acute change except as noted in the HPI.  Physical Exam Updated Vital Signs BP (!) 149/73   Pulse 88   Temp 97.3 F (36.3 C) (Oral)   Ht 4\' 9"  (1.448 m)   Wt 73.5 kg (162 lb)   SpO2 100%   BMI 35.06 kg/m   Physical Exam  Constitutional: She is oriented to person, place, and time. Vital signs are normal. She appears well-developed and well-nourished.  HENT:  Head: Normocephalic and atraumatic.  Right Ear: Hearing normal.  Left Ear: Hearing normal.  Eyes: Conjunctivae and EOM are normal. Pupils are equal, round, and reactive to light.  Neck: Normal range of motion.  Cardiovascular: Normal rate, regular rhythm, normal heart sounds and intact distal pulses.   Pulmonary/Chest: Effort normal.  Abdominal: Soft. There is no tenderness.  Musculoskeletal: Normal range of motion.  LLE with erythema and 3+ pitting edema around tibia. Weeping noted. RLE with mild 1+ pitting edema without erythema. NVI. Distal pulses appreciated.    Neurological: She is alert and oriented to person, place, and time.  Skin: Skin is warm and dry.  Psychiatric: She has a normal mood and affect. Her speech is normal and behavior is normal. Thought content normal.  Nursing note and vitals reviewed.        ED Treatments / Results  Labs (all labs ordered are listed, but only abnormal results are displayed) Labs Reviewed  BASIC METABOLIC PANEL - Abnormal; Notable for  the following:       Result Value   Creatinine, Ser 1.10 (*)    Calcium 8.7 (*)    GFR calc non Af Amer 48 (*)    GFR calc Af Amer 56 (*)    All other components within normal limits  CBC - Abnormal; Notable for the following:    RBC 3.20 (*)    Hemoglobin 9.9 (*)    HCT 32.1 (*)    MCV 100.3 (*)    All other components within normal limits  BRAIN  NATRIURETIC PEPTIDE    EKG  EKG Interpretation None       Radiology Dg Chest 2 View  Result Date: 04/30/2017 CLINICAL DATA:  Congestive heart failure. EXAM: CHEST  2 VIEW COMPARISON:  Radiographs of April 22, 2017. FINDINGS: Stable cardiomegaly. Atherosclerosis of thoracic aorta is noted. Severe dextroscoliosis of thoracic spine is noted. No pneumothorax or pleural effusion is noted. Right lung is clear. Mild left basilar atelectasis or scarring is noted. IMPRESSION: Aortic atherosclerosis. Stable cardiomegaly. Mild left basilar subsegmental atelectasis or scarring. Stable hiatal hernia. Electronically Signed   By: Marijo Conception, M.D.   On: 04/30/2017 21:24    Procedures Procedures (including critical care time)  Medications Ordered in ED Medications  vancomycin (VANCOCIN) IVPB 1000 mg/200 mL premix (not administered)     Initial Impression / Assessment and Plan / ED Course  I have reviewed the triage vital signs and the nursing notes.  Pertinent labs & imaging results that were available during my care of the patient were reviewed by me and considered in my medical decision making (see chart for details).  Final Clinical Impressions(s) / ED Diagnoses  {I have reviewed and evaluated the relevant laboratory values. {I have reviewed and evaluated the relevant imaging studies.  {I have reviewed the relevant previous healthcare records.  {I obtained HPI from historian. {Patient discussed with supervising physician.  ED Course:  Assessment: Pt is a 76 y.o. female with hx DVT/PE who presents with LLE redness and swelling since  yesterday. Recent Dc on 04-24-17 due to DVT of LLE. On Lovenox for this. Sent by PCP for eval. No fevers. No CP/SOB. On exam, pt in NAD. Nontoxic/nonseptic appearing. VSS. Afebrile. Lungs CTA. Heart RRR. Abdomen nontender soft. LLE with 3+ pitting edema. Cellulitis noted LLE around tibia. Weeping. RLE without redness or significant swelling. Labs unremarkable. CXR unreamrkable. Discussed with attending physician who has seen patient. Given Vancomycin in ED. It is also possible this could be the onset of a more extensive DVT. [place] unavailable this evening. Plan is to Admit to medicine for repeat ultrasound and further evaluation.   Disposition/Plan:  Admit Pt acknowledges and agrees with plan  Supervising Physician Long, Wonda Olds, MD  Final diagnoses:  Cellulitis of left lower extremity    New Prescriptions New Prescriptions   No medications on file     Shary Decamp, Hershal Coria 04/30/17 2301    Long, Wonda Olds, MD 05/01/17 1413

## 2017-05-01 ENCOUNTER — Encounter (HOSPITAL_COMMUNITY): Payer: Self-pay | Admitting: General Practice

## 2017-05-01 DIAGNOSIS — F329 Major depressive disorder, single episode, unspecified: Secondary | ICD-10-CM | POA: Diagnosis present

## 2017-05-01 DIAGNOSIS — F39 Unspecified mood [affective] disorder: Secondary | ICD-10-CM | POA: Diagnosis present

## 2017-05-01 DIAGNOSIS — I824Z2 Acute embolism and thrombosis of unspecified deep veins of left distal lower extremity: Secondary | ICD-10-CM | POA: Diagnosis not present

## 2017-05-01 DIAGNOSIS — E785 Hyperlipidemia, unspecified: Secondary | ICD-10-CM | POA: Diagnosis present

## 2017-05-01 DIAGNOSIS — I82421 Acute embolism and thrombosis of right iliac vein: Secondary | ICD-10-CM | POA: Diagnosis present

## 2017-05-01 DIAGNOSIS — E8770 Fluid overload, unspecified: Secondary | ICD-10-CM | POA: Diagnosis present

## 2017-05-01 DIAGNOSIS — Z7901 Long term (current) use of anticoagulants: Secondary | ICD-10-CM | POA: Diagnosis not present

## 2017-05-01 DIAGNOSIS — Z91048 Other nonmedicinal substance allergy status: Secondary | ICD-10-CM | POA: Diagnosis not present

## 2017-05-01 DIAGNOSIS — Z7982 Long term (current) use of aspirin: Secondary | ICD-10-CM | POA: Diagnosis not present

## 2017-05-01 DIAGNOSIS — L03116 Cellulitis of left lower limb: Secondary | ICD-10-CM

## 2017-05-01 DIAGNOSIS — F419 Anxiety disorder, unspecified: Secondary | ICD-10-CM | POA: Diagnosis present

## 2017-05-01 DIAGNOSIS — Z881 Allergy status to other antibiotic agents status: Secondary | ICD-10-CM | POA: Diagnosis not present

## 2017-05-01 DIAGNOSIS — I87022 Postthrombotic syndrome with inflammation of left lower extremity: Secondary | ICD-10-CM | POA: Diagnosis present

## 2017-05-01 DIAGNOSIS — Z91040 Latex allergy status: Secondary | ICD-10-CM | POA: Diagnosis not present

## 2017-05-01 DIAGNOSIS — Z86711 Personal history of pulmonary embolism: Secondary | ICD-10-CM | POA: Diagnosis not present

## 2017-05-01 DIAGNOSIS — D649 Anemia, unspecified: Secondary | ICD-10-CM | POA: Diagnosis present

## 2017-05-01 DIAGNOSIS — Z86718 Personal history of other venous thrombosis and embolism: Secondary | ICD-10-CM | POA: Diagnosis not present

## 2017-05-01 DIAGNOSIS — Z87891 Personal history of nicotine dependence: Secondary | ICD-10-CM | POA: Diagnosis not present

## 2017-05-01 DIAGNOSIS — G894 Chronic pain syndrome: Secondary | ICD-10-CM | POA: Diagnosis present

## 2017-05-01 DIAGNOSIS — I82412 Acute embolism and thrombosis of left femoral vein: Secondary | ICD-10-CM | POA: Diagnosis present

## 2017-05-01 LAB — CBC
HCT: 30.6 % — ABNORMAL LOW (ref 36.0–46.0)
HEMOGLOBIN: 9.3 g/dL — AB (ref 12.0–15.0)
MCH: 30.3 pg (ref 26.0–34.0)
MCHC: 30.4 g/dL (ref 30.0–36.0)
MCV: 99.7 fL (ref 78.0–100.0)
Platelets: 381 10*3/uL (ref 150–400)
RBC: 3.07 MIL/uL — AB (ref 3.87–5.11)
RDW: 13.9 % (ref 11.5–15.5)
WBC: 6.5 10*3/uL (ref 4.0–10.5)

## 2017-05-01 LAB — BASIC METABOLIC PANEL
ANION GAP: 5 (ref 5–15)
BUN: 11 mg/dL (ref 6–20)
CHLORIDE: 104 mmol/L (ref 101–111)
CO2: 27 mmol/L (ref 22–32)
Calcium: 8.6 mg/dL — ABNORMAL LOW (ref 8.9–10.3)
Creatinine, Ser: 0.94 mg/dL (ref 0.44–1.00)
GFR calc non Af Amer: 58 mL/min — ABNORMAL LOW (ref >60)
Glucose, Bld: 81 mg/dL (ref 65–99)
Potassium: 3.6 mmol/L (ref 3.5–5.1)
Sodium: 136 mmol/L (ref 135–145)

## 2017-05-01 MED ORDER — METHOCARBAMOL 500 MG PO TABS
500.0000 mg | ORAL_TABLET | Freq: Three times a day (TID) | ORAL | Status: DC
  Administered 2017-05-01 (×2): 500 mg via ORAL
  Filled 2017-05-01 (×3): qty 1

## 2017-05-01 MED ORDER — DIPHENHYDRAMINE HCL 50 MG/ML IJ SOLN
25.0000 mg | Freq: Once | INTRAMUSCULAR | Status: AC
  Administered 2017-05-01: 25 mg via INTRAVENOUS
  Filled 2017-05-01: qty 1

## 2017-05-01 MED ORDER — HYDROCODONE-ACETAMINOPHEN 5-325 MG PO TABS
1.0000 | ORAL_TABLET | Freq: Two times a day (BID) | ORAL | Status: DC | PRN
  Administered 2017-05-01 – 2017-05-03 (×4): 1 via ORAL
  Filled 2017-05-01 (×4): qty 1

## 2017-05-01 MED ORDER — DOXYCYCLINE HYCLATE 100 MG IV SOLR
100.0000 mg | Freq: Two times a day (BID) | INTRAVENOUS | Status: DC
  Administered 2017-05-01 – 2017-05-02 (×3): 100 mg via INTRAVENOUS
  Filled 2017-05-01 (×4): qty 100

## 2017-05-01 NOTE — Progress Notes (Signed)
[facility] Progress Note  Patient: [name] May XIP:[id number]   PCP: Jefm Petty, MD DOB: 1941-03-17   DOA: 04/30/2017   DOS: 05/01/2017   Date of Service: the patient was seen and examined on 05/01/2017  Subjective: Patient is a pain is actually better but later in the afternoon she complained about worsening pain. Redness is about the same swelling stays is better as well. No fever no chills at home. She mentions that she had a similar reaction with her earlier DVT with even worse swelling and hives which were contributed to her compression stockings.  Brief hospital course: Pt. with PMH of recurrent DVT; admitted on 04/30/2017, presented with complaint of swelling of the leg with redness, was found to have cellulitis. Currently further plan is continue IV antibiotics.  Assessment and Plan: 1. Cellulitis of the left leg. Patient has been started on treatment for cellulitis of the left leg with clindamycin. 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 Currently we will continue with IV antibiotics monitor culture.  2. 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 Awaiting results of the vascular ultrasound. We discussed with vascular surgery tomorrow after the results for further treatment options. Continue anticoagulation at present.  3. Chronic pain syndrome. Continuing home regimen for pain. Adding Robaxin to the regimen for leg pain as well.  4. Anxiety and mood disorder. Continue Wellbutrin and Lamictal.  5. Volume overload with leg edema. May require Lasix currently watching clinically.   Diet: cardiac diet  Advance goals of care discussion: full code  Family Communication: no family was present at bedside, at the time of interview.  Disposition:  Discharge to home.  Consultants: none Procedures: none  Antibiotics: Anti-infectives    Start     Dose/Rate Route Frequency Ordered Stop   05/01/17 2200  vancomycin  (VANCOCIN) IVPB 1000 mg/200 mL premix  Status:  Discontinued     1,000 mg 200 mL/hr over 60 Minutes Intravenous Every 24 hours 04/30/17 2336 05/01/17 0111   05/01/17 0400  doxycycline (VIBRAMYCIN) 100 mg in dextrose 5 % 250 mL IVPB     100 mg 125 mL/hr over 120 Minutes Intravenous Every 12 hours 05/01/17 0100     04/30/17 2245  vancomycin (VANCOCIN) IVPB 1000 mg/200 mL premix     1,000 mg 200 mL/hr over 60 Minutes Intravenous  Once 04/30/17 2243 05/01/17 0041       Objective: Physical Exam: Vitals:   05/01/17 0141 05/01/17 0151 05/01/17 0521 05/01/17 1443  BP: 110/72  133/64 132/68  Pulse: 99  91 93  Resp: 18  16 20   Temp: 99 F (37.2 C)  98.8 F (37.1 C) 97.8 F (36.6 C)  TempSrc:    Oral  SpO2: (!) 89% 94% 96% 94%  Weight:      Height:        Intake/Output Summary (Last 24 hours) at 05/01/17 1858 Last data filed at 05/01/17 1800  Gross per 24 hour  Intake                0 ml  Output             2100 ml  Net            -2100 ml   Filed Weights   04/30/17 2002  Weight: 73.5 kg (162 lb)   General: Alert, Awake and Oriented to Time, Place and Person. Appear in mild distress, affect appropriate Eyes: PERRL, Conjunctiva normal ENT: Oral Mucosa clear moist. Neck: no  JVD, no Abnormal Mass Or lumps Cardiovascular: S1 and S2 Present, no Murmur, Peripheral Pulses Present Respiratory: normal respiratory effort, Bilateral Air entry equal and Decreased, no use of accessory muscle, Clear to Auscultation, no Crackles, no wheezes Abdomen: Bowel Sound present, Soft and no tenderness, no hernia Skin: bilateral redness, no Rash, no induration Extremities: bilateral Pedal edema, no calf tenderness Neurologic: Grossly no focal neuro deficit. Bilaterally Equal motor strength  Data Reviewed: CBC:  Recent Labs Lab 04/30/17 2018 05/01/17 0824  WBC 8.0 6.5  HGB 9.9* 9.3*  HCT 32.1* 30.6*  MCV 100.3* 99.7  PLT 390 415   Basic Metabolic Panel:  Recent Labs Lab 04/30/17 2018  05/01/17 0824  NA 137 136  K 4.0 3.6  CL 103 104  CO2 27 27  GLUCOSE 88 81  BUN 13 11  CREATININE 1.10* 0.94  CALCIUM 8.7* 8.6*    Liver Function Tests: No results for input(s): AST, ALT, ALKPHOS, BILITOT, PROT, ALBUMIN in the last 168 hours. No results for input(s): LIPASE, AMYLASE in the last 168 hours. No results for input(s): AMMONIA in the last 168 hours. Coagulation Profile: No results for input(s): INR, PROTIME in the last 168 hours. Cardiac Enzymes: No results for input(s): CKTOTAL, CKMB, CKMBINDEX, TROPONINI in the last 168 hours. BNP (last 3 results) No results for input(s): PROBNP in the last 8760 hours. CBG: No results for input(s): GLUCAP in the last 168 hours. Studies: Dg Chest 2 View  Result Date: 04/30/2017 CLINICAL DATA:  Congestive heart failure. EXAM: CHEST  2 VIEW COMPARISON:  Radiographs of April 22, 2017. FINDINGS: Stable cardiomegaly. Atherosclerosis of thoracic aorta is noted. Severe dextroscoliosis of thoracic spine is noted. No pneumothorax or pleural effusion is noted. Right lung is clear. Mild left basilar atelectasis or scarring is noted. IMPRESSION: Aortic atherosclerosis. Stable cardiomegaly. Mild left basilar subsegmental atelectasis or scarring. Stable hiatal hernia. Electronically Signed   By: Marijo Conception, M.D.   On: 04/30/2017 21:24    Scheduled Meds: . aspirin EC  325 mg Oral QHS  . buPROPion  300 mg Oral Daily  . enoxaparin (LOVENOX) injection  70 mg Subcutaneous Q12H  . gabapentin  600 mg Oral 3 times per day  . iron polysaccharides  150 mg Oral Daily  . lamoTRIgine  200 mg Oral QHS  . LORazepam  0.5 mg Oral BID  . methocarbamol  500 mg Oral TID  . rOPINIRole  1 mg Oral Daily  . rOPINIRole  2 mg Oral 3 times per day   Continuous Infusions: . doxycycline (VIBRAMYCIN) IV Stopped (05/01/17 1736)   PRN Meds: acetaminophen **OR** acetaminophen, dicyclomine, HYDROcodone-acetaminophen, loratadine, ondansetron **OR** ondansetron (ZOFRAN)  IV, pantoprazole, zolpidem  Time spent: 35 minutes  Author: Berle Mull, MD [facility] Pager: [phone] 05/01/2017 6:58 PM  If 7PM-7AM, please contact night-coverage at [email or web address], password [facility]

## 2017-05-01 NOTE — ED Notes (Signed)
Spoke with Dr. Raliegh Ip about sensitivity to Vanc. Per MD to stop vanc and administer 25mg  benadryl IV.

## 2017-05-01 NOTE — ED Notes (Addendum)
Pt noted to c/o redness and burning to R arm during infusion. Infusion stopped at this time. Provider to be notified

## 2017-05-02 ENCOUNTER — Inpatient Hospital Stay (HOSPITAL_COMMUNITY): Payer: Medicare HMO

## 2017-05-02 DIAGNOSIS — Z86718 Personal history of other venous thrombosis and embolism: Secondary | ICD-10-CM

## 2017-05-02 DIAGNOSIS — I824Z2 Acute embolism and thrombosis of unspecified deep veins of left distal lower extremity: Secondary | ICD-10-CM

## 2017-05-02 LAB — PROTIME-INR
INR: 1.14
PROTHROMBIN TIME: 14.7 s (ref 11.4–15.2)

## 2017-05-02 LAB — BASIC METABOLIC PANEL
Anion gap: 4 — ABNORMAL LOW (ref 5–15)
BUN: 7 mg/dL (ref 6–20)
CHLORIDE: 104 mmol/L (ref 101–111)
CO2: 29 mmol/L (ref 22–32)
CREATININE: 0.79 mg/dL (ref 0.44–1.00)
Calcium: 8.6 mg/dL — ABNORMAL LOW (ref 8.9–10.3)
GFR calc Af Amer: >60 mL/min (ref >60)
GFR calc non Af Amer: >60 mL/min (ref >60)
Glucose, Bld: 102 mg/dL — ABNORMAL HIGH (ref 65–99)
POTASSIUM: 3.4 mmol/L — AB (ref 3.5–5.1)
SODIUM: 137 mmol/L (ref 135–145)

## 2017-05-02 LAB — MAGNESIUM: MAGNESIUM: 2 mg/dL (ref 1.7–2.4)

## 2017-05-02 LAB — CBC
HEMATOCRIT: 29.9 % — AB (ref 36.0–46.0)
HEMOGLOBIN: 9 g/dL — AB (ref 12.0–15.0)
MCH: 30.1 pg (ref 26.0–34.0)
MCHC: 30.1 g/dL (ref 30.0–36.0)
MCV: 100 fL (ref 78.0–100.0)
Platelets: 386 10*3/uL (ref 150–400)
RBC: 2.99 MIL/uL — AB (ref 3.87–5.11)
RDW: 13.9 % (ref 11.5–15.5)
WBC: 6 10*3/uL (ref 4.0–10.5)

## 2017-05-02 MED ORDER — METHOCARBAMOL 500 MG PO TABS
500.0000 mg | ORAL_TABLET | Freq: Three times a day (TID) | ORAL | Status: DC
  Administered 2017-05-02 – 2017-05-03 (×5): 500 mg via ORAL
  Filled 2017-05-02 (×4): qty 1

## 2017-05-02 MED ORDER — ROPINIROLE HCL 1 MG PO TABS
1.0000 mg | ORAL_TABLET | Freq: Every day | ORAL | Status: DC
  Administered 2017-05-02 – 2017-05-03 (×2): 1 mg via ORAL
  Filled 2017-05-02: qty 1

## 2017-05-02 MED ORDER — BUPROPION HCL ER (XL) 150 MG PO TB24
300.0000 mg | ORAL_TABLET | Freq: Every day | ORAL | Status: DC
  Administered 2017-05-02 – 2017-05-03 (×2): 300 mg via ORAL
  Filled 2017-05-02: qty 2

## 2017-05-02 MED ORDER — LORAZEPAM 0.5 MG PO TABS
0.5000 mg | ORAL_TABLET | Freq: Two times a day (BID) | ORAL | Status: DC
  Administered 2017-05-02 – 2017-05-03 (×3): 0.5 mg via ORAL
  Filled 2017-05-02 (×2): qty 1

## 2017-05-02 MED ORDER — DOXYCYCLINE HYCLATE 100 MG PO TABS
100.0000 mg | ORAL_TABLET | Freq: Two times a day (BID) | ORAL | Status: DC
  Administered 2017-05-02 – 2017-05-03 (×2): 100 mg via ORAL
  Filled 2017-05-02 (×2): qty 1

## 2017-05-02 NOTE — Progress Notes (Signed)
[place] for Lovenox  Indication: DVT  Allergies  Allergen Reactions  . Latex Itching and Rash    Blisters for months on legs  . Mold Extract [Trichophyton] Anaphylaxis, Swelling and Rash  . Vancomycin     05/01/17: Pt noted to c/o redness and burning to R arm during IV Vancomycin 1g infusion. Infusion stopped and diphenhydramine 25mg  IV given.    Patient Measurements: Height: 4\' 9"  (144.8 cm) Weight: 162 lb (73.5 kg) IBW/kg (Calculated) : 38.6  Vital Signs: Temp: 97.7 F (36.5 C) (06/21 0457) Temp Source: Oral (06/21 0457) BP: 109/68 (06/21 0457) Pulse Rate: 87 (06/21 0457)  Labs:  Recent Labs  04/30/17 2018 05/01/17 0824 05/02/17 0616  HGB 9.9* 9.3* 9.0*  HCT 32.1* 30.6* 29.9*  PLT 390 381 386  LABPROT  --   --  14.7  INR  --   --  1.14  CREATININE 1.10* 0.94 0.79    Estimated Creatinine Clearance: 51.2 mL/min (by C-G formula based on SCr of 0.79 mg/dL).  Assessment: 76 y/o F recently discharged on 04/24/17 with LLE DVT and started on therapeutic lovenox. She reported good compliance with Lovenox. H/H and platelets have remained stable throughout admission. No bleeding noted. Dose remains appropriate.   Goal of Therapy:  Monitor platelets by anticoagulation protocol: Yes   Plan:  Continue Lovenox 70 mg subcutaneous q12h Minimum q72h CBC while inpatient Monitor for bleeding *Pharmacy will sign off and only follow peripherally as no dose adjustments are anticipated. Thank you for the consult!  Wilford Merryfield, Rande Lawman 05/02/2017,10:02 AM

## 2017-05-02 NOTE — Progress Notes (Signed)
[facility] Progress Note  Patient: [name] May TFT:[id number]   PCP: Jefm Petty, MD DOB: 1941/08/09   DOA: 04/30/2017   DOS: 05/02/2017   Date of Service: the patient was seen and examined on 05/02/2017  Subjective: Pain better, redness better. Swelling is about the same. No chest pain or shortness of breath. No nausea no vomiting but no active bleeding.  Brief hospital course: Pt. with PMH of recurrent DVT; admitted on 04/30/2017, presented with complaint of swelling of the leg with redness, was found to have cellulitis. Currently further plan is continue IV antibiotics.  Assessment and Plan: 1. Cellulitis of the left leg. Patient has been started on treatment for cellulitis of the left leg with clindamycin. 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 Currently we will continue with IV antibiotics monitor culture.  2. 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 Vascular ultrasound shows evidence of occlusive DVT in the left common femoral as well as femoral veins. DVT also in left popliteal vein. As well as left iliac vein. A proximal DVT in the right iliac vein is also present. Discussed with vascular surgery, we will evaluate the patient and provide recommendation. Patient may require endovascular intervention, most likely as an outpatient. Continue anticoagulation at present.  3. Chronic pain syndrome. Continuing home regimen for pain. Adding Robaxin to the regimen for leg pain as well.  4. Anxiety and mood disorder. Continue Wellbutrin and Lamictal.  5. Volume overload with leg edema. May require Lasix currently watching clinically.  Diet: cardiac diet  Advance goals of care discussion: full code  Family Communication: family was present at bedside, at the time of interview.  Disposition:  Discharge to home.  Consultants: Vascular surgery Procedures: none  Antibiotics: Anti-infectives    Start     Dose/Rate  Route Frequency Ordered Stop   05/02/17 2200  doxycycline (VIBRA-TABS) tablet 100 mg     100 mg Oral Every 12 hours 05/02/17 1002     05/01/17 2200  vancomycin (VANCOCIN) IVPB 1000 mg/200 mL premix  Status:  Discontinued     1,000 mg 200 mL/hr over 60 Minutes Intravenous Every 24 hours 04/30/17 2336 05/01/17 0111   05/01/17 0400  doxycycline (VIBRAMYCIN) 100 mg in dextrose 5 % 250 mL IVPB  Status:  Discontinued     100 mg 125 mL/hr over 120 Minutes Intravenous Every 12 hours 05/01/17 0100 05/02/17 1002   04/30/17 2245  vancomycin (VANCOCIN) IVPB 1000 mg/200 mL premix     1,000 mg 200 mL/hr over 60 Minutes Intravenous  Once 04/30/17 2243 05/01/17 0041       Objective: Physical Exam: Vitals:   05/01/17 1443 05/01/17 2206 05/02/17 0457 05/02/17 1415  BP: 132/68 122/65 109/68 (!) 98/54  Pulse: 93 88 87 97  Resp: 20 18 18 17   Temp: 97.8 F (36.6 C) 98.5 F (36.9 C) 97.7 F (36.5 C) 98.7 F (37.1 C)  TempSrc: Oral Oral Oral   SpO2: 94% 96% 94% 91%  Weight:      Height:        Intake/Output Summary (Last 24 hours) at 05/02/17 1714 Last data filed at 05/02/17 1443  Gross per 24 hour  Intake              500 ml  Output             2000 ml  Net            -1500 ml   Filed Weights   04/30/17  2002  Weight: 73.5 kg (162 lb)   General: Alert, Awake and Oriented to Time, Place and Person. Appear in mild distress, affect appropriate Eyes: PERRL, Conjunctiva normal ENT: Oral Mucosa clear moist. Neck: no JVD, no Abnormal Mass Or lumps Cardiovascular: S1 and S2 Present, no Murmur, Peripheral Pulses Present Respiratory: normal respiratory effort, Bilateral Air entry equal and Decreased, no use of accessory muscle, Clear to Auscultation, no Crackles, no wheezes Abdomen: Bowel Sound present, Soft and no tenderness, no hernia Skin: bilateral redness, no Rash, no induration Extremities: bilateral Pedal edema, no calf tenderness Neurologic: Grossly no focal neuro deficit. Bilaterally  Equal motor strength  Data Reviewed: CBC:  Recent Labs Lab 04/30/17 2018 05/01/17 0824 05/02/17 0616  WBC 8.0 6.5 6.0  HGB 9.9* 9.3* 9.0*  HCT 32.1* 30.6* 29.9*  MCV 100.3* 99.7 100.0  PLT 390 381 182   Basic Metabolic Panel:  Recent Labs Lab 04/30/17 2018 05/01/17 0824 05/02/17 0616  NA 137 136 137  K 4.0 3.6 3.4*  CL 103 104 104  CO2 27 27 29   GLUCOSE 88 81 102*  BUN 13 11 7   CREATININE 1.10* 0.94 0.79  CALCIUM 8.7* 8.6* 8.6*  MG  --   --  2.0    Liver Function Tests: No results for input(s): AST, ALT, ALKPHOS, BILITOT, PROT, ALBUMIN in the last 168 hours. No results for input(s): LIPASE, AMYLASE in the last 168 hours. No results for input(s): AMMONIA in the last 168 hours. Coagulation Profile:  Recent Labs Lab 05/02/17 0616  INR 1.14   Cardiac Enzymes: No results for input(s): CKTOTAL, CKMB, CKMBINDEX, TROPONINI in the last 168 hours. BNP (last 3 results) No results for input(s): PROBNP in the last 8760 hours. CBG: No results for input(s): GLUCAP in the last 168 hours. Studies: No results found.  Scheduled Meds: . aspirin EC  325 mg Oral QHS  . buPROPion  300 mg Oral Daily  . doxycycline  100 mg Oral Q12H  . enoxaparin (LOVENOX) injection  70 mg Subcutaneous Q12H  . gabapentin  600 mg Oral 3 times per day  . iron polysaccharides  150 mg Oral Daily  . lamoTRIgine  200 mg Oral QHS  . LORazepam  0.5 mg Oral BID  . methocarbamol  500 mg Oral TID  . rOPINIRole  1 mg Oral Daily  . rOPINIRole  2 mg Oral 3 times per day   Continuous Infusions:  PRN Meds: acetaminophen **OR** acetaminophen, dicyclomine, HYDROcodone-acetaminophen, loratadine, ondansetron **OR** ondansetron (ZOFRAN) IV, pantoprazole, zolpidem  Time spent: 35 minutes  Author: Berle Mull, MD [facility] Pager: [phone] 05/02/2017 5:14 PM  If 7PM-7AM, please contact night-coverage at [email or web address], password [facility]

## 2017-05-02 NOTE — Progress Notes (Signed)
*  PRELIMINARY RESULTS* Vascular Ultrasound Bilateral lower extremity venous duplex has been completed.  Preliminary findings: No evidence of DVT in the right lower extremity, however continuous venous flow is noted. Occlusive DVT noted in the left common femoral and femoral veins. Partial DVT noted in the left popliteal vein. Iliac duplex: Technically limited due to bowel gas. Appears to be DVT at proximal Right iliac vein. Appears to be DVT in entire Left iliac vein.    Paged Silva Bandy, PA at 3:50pm to request order for IVC duplex. Page not returned so patient sent back to room. Please advise if this imaging is needed. Patient will need to be NPO for 6 hours.    Landry Mellow, RDMS, RVT  05/02/2017, 4:21 PM

## 2017-05-02 NOTE — Consult Note (Addendum)
[facility]  Patient name: [name] May MRN: [id number] DOB: 05/29/1941 Sex: female  REASON FOR CONSULT: left lower extremity DVT, consult is from Dr. Berle Mull.   HPI: [name] May is a 76 y.o. female, who presents with worsening left leg erythema and pain. She has a history of recurrent bilateral leg DVT and PE x 3 who was diagnosed with a new left leg DVT (involving left iliac, common femoral, femoral, popliteal and calf veins) on 04/22/17. Her hematologist Dr Cruzita Lederer in [facility] recommended Lovenox for at least three months. She was discharged home on 04/24/17. She has failed warfarin therapy in the past and experienced a GI bleed two years ago with Eliquis. Therefore, she had an IVC filter placed in addition to 325 mg aspirin daily.   She missed her last two doses of Lovenox secondary to insurance issues. She noticed that her left leg become more erythematous and started blistering. She has tried to wear compression stockings but reports a reaction to the latex in the stockings.   Her past medical history includes iron deficiency anemia, chronic pain syndrome, anxiety and mood disorder and hyperlipidemia, currently not on a statin.   She is a former smoker quitting 22 years ago.  Past Medical History:  Diagnosis Date  . Allergy   . Anemia   . Back pain   . Bipolar disorder ([place])   . Chronic headaches   . Degenerative disorder of bone   . Depression   . Esophageal ulcer   . Hyperlipidemia   . Pulmonary embolism ([place])   . Scoliosis     Family History  Problem Relation Age of Onset  . Deep vein thrombosis Neg Hx     SOCIAL HISTORY: Social History   Social History  . Marital status: Widowed    Spouse name: N/A  . Number of children: N/A  . Years of education: N/A   Occupational History  . Not on file.   Social History Main Topics  . Smoking status: Former Research scientist (life sciences)  . Smokeless tobacco: Never Used     Comment: quit 22 years ago     . Alcohol use No  . Drug use: No  . Sexual activity: Not Currently   Other Topics Concern  . Not on file   Social History Narrative  . No narrative on file    Allergies  Allergen Reactions  . Latex Itching and Rash    Blisters for months on legs  . Mold Extract [Trichophyton] Anaphylaxis, Swelling and Rash  . Vancomycin     05/01/17: Pt noted to c/o redness and burning to R arm during IV Vancomycin 1g infusion. Infusion stopped and diphenhydramine 25mg  IV given.     MEDICATIONS:  Current Facility-Administered Medications  Medication Dose Route Frequency Provider Last Rate Last Dose  . acetaminophen (TYLENOL) tablet 650 mg  650 mg Oral Q6H PRN Rise Patience, MD   650 mg at 05/01/17 8127   Or  . acetaminophen (TYLENOL) suppository 650 mg  650 mg Rectal Q6H PRN Rise Patience, MD      . aspirin EC tablet 325 mg  325 mg Oral QHS Rise Patience, MD      . buPROPion (WELLBUTRIN XL) 24 hr tablet 300 mg  300 mg Oral Daily Lavina Hamman, MD   300 mg at 05/02/17 0802  . dicyclomine (BENTYL) tablet 20 mg  20 mg Oral Q6H PRN Rise Patience, MD      .  doxycycline (VIBRA-TABS) tablet 100 mg  100 mg Oral Q12H Rumbarger, Rachel L, RPH      . enoxaparin (LOVENOX) injection 70 mg  70 mg Subcutaneous Q12H Erenest Blank, RPH   70 mg at 05/02/17 0802  . gabapentin (NEURONTIN) tablet 600 mg  600 mg Oral 3 times per day Rise Patience, MD   600 mg at 05/01/17 2018  . HYDROcodone-acetaminophen (NORCO/VICODIN) 5-325 MG per tablet 1 tablet  1 tablet Oral BID PRN Rise Patience, MD   1 tablet at 05/02/17 0432  . iron polysaccharides (NIFEREX) capsule 150 mg  150 mg Oral Daily Rise Patience, MD   150 mg at 05/02/17 0803  . lamoTRIgine (LAMICTAL) tablet 200 mg  200 mg Oral QHS Rise Patience, MD   200 mg at 05/01/17 2150  . loratadine (CLARITIN) tablet 10 mg  10 mg Oral Daily PRN Rise Patience, MD      . LORazepam (ATIVAN) tablet 0.5 mg  0.5 mg  Oral BID Lavina Hamman, MD   0.5 mg at 05/02/17 1610  . methocarbamol (ROBAXIN) tablet 500 mg  500 mg Oral TID Lavina Hamman, MD   500 mg at 05/02/17 0803  . ondansetron (ZOFRAN) tablet 4 mg  4 mg Oral Q6H PRN Rise Patience, MD       Or  . ondansetron [facility]) injection 4 mg  4 mg Intravenous Q6H PRN Rise Patience, MD      . pantoprazole (PROTONIX) EC tablet 40 mg  40 mg Oral BID PRN Rise Patience, MD      . rOPINIRole (REQUIP) tablet 1 mg  1 mg Oral Daily Lavina Hamman, MD   1 mg at 05/02/17 0830  . rOPINIRole (REQUIP) tablet 2 mg  2 mg Oral 3 times per day Rise Patience, MD   2 mg at 05/01/17 2018  . zolpidem (AMBIEN) tablet 5 mg  5 mg Oral QHS PRN Rise Patience, MD        REVIEW OF SYSTEMS:  [X]  denotes positive finding, [ ]  denotes negative finding Cardiac  Comments:  Chest pain or chest pressure:    Shortness of breath upon exertion:    Short of breath when lying flat:    Irregular heart rhythm:        Vascular    Pain in calf, thigh, or hip brought on by ambulation:    Pain in feet at night that wakes you up from your sleep:     Blood clot in your veins:    Leg swelling:  x       Pulmonary    Oxygen at home:    Productive cough:     Wheezing:         Neurologic    Sudden weakness in arms or legs:     Sudden numbness in arms or legs:     Sudden onset of difficulty speaking or slurred speech:    Temporary loss of vision in one eye:     Problems with dizziness:         Gastrointestinal    Blood in stool:     Vomited blood:         Genitourinary    Burning when urinating:     Blood in urine:        Psychiatric    Major depression:         Hematologic    Bleeding problems:    Problems  with blood clotting too easily:        Skin    Rashes or ulcers:        Constitutional    Fever or chills:      PHYSICAL EXAM: Vitals:   05/01/17 0521 05/01/17 1443 05/01/17 2206 05/02/17 0457  BP: 133/64 132/68 122/65 109/68    Pulse: 91 93 88 87  Resp: 16 20 18 18   Temp: 98.8 F (37.1 C) 97.8 F (36.6 C) 98.5 F (36.9 C) 97.7 F (36.5 C)  TempSrc:  Oral Oral Oral  SpO2: 96% 94% 96% 94%  Weight:      Height:        GENERAL: The patient is a well-nourished female, in no acute distress. Was in the bathroom giving herself a sponge bath. The vital signs are documented above. HEENT: normocephalic, atraumatic, no abnormalities noted.  PULMONARY: Non labored respiratory effort. No accessory muscle use.  VASCULAR: No evidence of ischemia. Legs appear well perfused.  ABDOMEN: Soft and non-tender with normal pitched bowel sounds.  MUSCULOSKELETAL:  Left leg swollen significantly more than right. Erythema from ankle to mid calf. NEUROLOGIC: Slow gait. SKIN: No ulcerations or sores seen.  PSYCHIATRIC: The patient has a normal affect.  DATA:  Venous duplex pending  MEDICAL ISSUES: Left leg DVT Hx of recurrent bilateral DVT and PE, s/p IVC filter  No evidence of left leg ischemia. Cannot tolerate NOAC secondary to GI bleed. Currently on Lovenox. May consider venogram to evaluate for May Thurner syndrome in addition to possible catheter directed thrombolysis and pharmacomechanical thrombectomy based on results of duplex. Also discussed continued Lovenox and compression therapy. Will await results of venous duplex to determine need for procedure. Next availability for venous procedure is Monday. Patient may potentially be discharged home on Lovenox and return Monday.   Virgina Jock, PA-C [facility] [place]    I agree with the above.  I have seen and examined the patient.  Her repeat venous u/s is pending.  If she has evidence of thrombus in the common femoral vein and iliac vein, she potentially would be a candidate for venography and attempt at thrombecotmy / thrombolysis.  This would not be able to be performed until next week.  She potentially could be discharged on Lovenox and  return for the procedure.  I discuuued the importance of compression.  She had a latex allergy to her last pain of stockings, and is going to look into non-latex compression with zippers.  I will follow up tomorrow.  Wells Nelta Caudill  Addendum:  I have reviewed her duplex.  I will discuss this with her on Friday.  We are planning on angiography and possible intervention on Monday if she is agreeable.   [name]

## 2017-05-03 LAB — CBC
HEMATOCRIT: 33.9 % — AB (ref 36.0–46.0)
HEMOGLOBIN: 10.1 g/dL — AB (ref 12.0–15.0)
MCH: 30.4 pg (ref 26.0–34.0)
MCHC: 29.8 g/dL — AB (ref 30.0–36.0)
MCV: 102.1 fL — AB (ref 78.0–100.0)
Platelets: 426 10*3/uL — ABNORMAL HIGH (ref 150–400)
RBC: 3.32 MIL/uL — ABNORMAL LOW (ref 3.87–5.11)
RDW: 14.3 % (ref 11.5–15.5)
WBC: 5.3 10*3/uL (ref 4.0–10.5)

## 2017-05-03 LAB — BASIC METABOLIC PANEL
ANION GAP: 7 (ref 5–15)
BUN: 7 mg/dL (ref 6–20)
CHLORIDE: 104 mmol/L (ref 101–111)
CO2: 27 mmol/L (ref 22–32)
CREATININE: 0.82 mg/dL (ref 0.44–1.00)
Calcium: 8.9 mg/dL (ref 8.9–10.3)
GFR calc non Af Amer: >60 mL/min (ref >60)
GLUCOSE: 83 mg/dL (ref 65–99)
Potassium: 3.7 mmol/L (ref 3.5–5.1)
Sodium: 138 mmol/L (ref 135–145)

## 2017-05-03 MED ORDER — METHOCARBAMOL 500 MG PO TABS: 500.0000 mg | ORAL_TABLET | Freq: Three times a day (TID) | ORAL | 0 refills | Status: DC

## 2017-05-03 MED ORDER — DOXYCYCLINE HYCLATE 100 MG PO TABS: 100.0000 mg | ORAL_TABLET | Freq: Two times a day (BID) | ORAL | 0 refills | Status: DC

## 2017-05-03 MED ORDER — ENOXAPARIN SODIUM 80 MG/0.8ML ~~LOC~~ SOLN: 70.0000 mg | Freq: Two times a day (BID) | SUBCUTANEOUS | 0 refills | Status: DC

## 2017-05-03 NOTE — Progress Notes (Addendum)
Benefits check in process for Lovenox 70 mg twice a day. CM to follow up with results. Whitman Hero RN,BSN,CM

## 2017-05-03 NOTE — Progress Notes (Signed)
    Subjective  -   No interval change   Physical Exam:  Left leg continues to be edematous with erythema.   I have reviewed her vascular lab ultrasound which reveals DVT in the left common femoral vein which is occlusive.  It appears to be in the left iliac vein as well.    Assessment/Plan:    I discussed with the patient and her daughter, proceeding with venography this coming Monday.  A total we would access the popliteal vein, and perform imaging evaluation to see if she is a candidate for intervention.  We discussed the possibility of May Thurner.  We talked about pharmacological and mechanical from a lysis.  If she has stenosis, this would require stenting.  She understands the risks including the risk of bleeding as well as renal failure.  She will need to be hydrated over the weekend.  [name] 05/03/2017 1:41 PM --  Vitals:   05/02/17 2136 05/03/17 0510  BP: 126/66 120/64  Pulse: 87 82  Resp: 18 18  Temp: 98.6 F (37 C) 98.4 F (36.9 C)    Intake/Output Summary (Last 24 hours) at 05/03/17 1341 Last data filed at 05/03/17 0946  Gross per 24 hour  Intake              240 ml  Output             1900 ml  Net            -1660 ml     Laboratory CBC    Component Value Date/Time   WBC 5.3 05/03/2017 0519   HGB 10.1 (L) 05/03/2017 0519   HCT 33.9 (L) 05/03/2017 0519   PLT 426 (H) 05/03/2017 0519    BMET    Component Value Date/Time   NA 138 05/03/2017 0519   K 3.7 05/03/2017 0519   CL 104 05/03/2017 0519   CO2 27 05/03/2017 0519   GLUCOSE 83 05/03/2017 0519   BUN 7 05/03/2017 0519   CREATININE 0.82 05/03/2017 0519   CALCIUM 8.9 05/03/2017 0519   GFRNONAA >60 05/03/2017 0519   GFRAA >60 05/03/2017 0519    COAG Lab Results  Component Value Date   INR 1.14 05/02/2017   INR 2.94 (H) 05/26/2015   INR 2.08 (H) 02/15/2015   No results found for: PTT  Antibiotics Anti-infectives    Start     Dose/Rate Route Frequency Ordered Stop   05/03/17 0000  doxycycline (VIBRA-TABS) 100 MG tablet     100 mg Oral Every 12 hours 05/03/17 0802 05/06/17 2359   05/02/17 2200  doxycycline (VIBRA-TABS) tablet 100 mg     100 mg Oral Every 12 hours 05/02/17 1002     05/01/17 2200  vancomycin (VANCOCIN) IVPB 1000 mg/200 mL premix  Status:  Discontinued     1,000 mg 200 mL/hr over 60 Minutes Intravenous Every 24 hours 04/30/17 2336 05/01/17 0111   05/01/17 0400  doxycycline (VIBRAMYCIN) 100 mg in dextrose 5 % 250 mL IVPB  Status:  Discontinued     100 mg 125 mL/hr over 120 Minutes Intravenous Every 12 hours 05/01/17 0100 05/02/17 1002   04/30/17 2245  vancomycin (VANCOCIN) IVPB 1000 mg/200 mL premix     1,000 mg 200 mL/hr over 60 Minutes Intravenous  Once 04/30/17 2243 05/01/17 0041       V. Leia Alf, M.D. [facility] [place] Office: [phone] Pager:  [phone]

## 2017-05-03 NOTE — Progress Notes (Signed)
Nsg Discharge Note  Admit Date:  04/30/2017 Discharge date: 05/03/2017   [name] May to be D/C'd Home per MD order.  AVS completed.  Copy for chart, and copy for patient signed, and dated. Patient/caregiver able to verbalize understanding.  Discharge Medication: Allergies as of 05/03/2017      Reactions   Latex Itching, Rash   Blisters for months on legs   Mold Extract [trichophyton] Anaphylaxis, Swelling, Rash   Vancomycin    05/01/17: Pt noted to c/o redness and burning to R arm during IV Vancomycin 1g infusion. Infusion stopped and diphenhydramine 25mg  IV given.       Medication List    STOP taking these medications   furosemide 20 MG tablet Commonly known as:  LASIX     TAKE these medications   ascorbic acid 250 MG tablet Commonly known as:  VITAMIN C Take 250 mg by mouth 2 (two) times daily.   aspirin EC 325 MG tablet Take 325 mg by mouth at bedtime.   buPROPion 300 MG 24 hr tablet Commonly known as:  WELLBUTRIN XL Take 300 mg by mouth daily.   CALCIUM + D PO Take 1 tablet by mouth 2 (two) times daily.   dicyclomine 20 MG tablet Commonly known as:  BENTYL Take 20 mg by mouth every 6 (six) hours as needed for spasms.   doxycycline 100 MG tablet Commonly known as:  VIBRA-TABS Take 1 tablet (100 mg total) by mouth every 12 (twelve) hours.   enoxaparin 80 MG/0.8ML injection Commonly known as:  LOVENOX Inject 0.7 mLs (70 mg total) into the skin every 12 (twelve) hours.   gabapentin 600 MG tablet Commonly known as:  NEURONTIN Take 600 mg by mouth 3 (three) times daily. AT 1500, 1800, and 2100   hydrocortisone 2.5 % cream Apply 1 application topically daily as needed (eczema).   lamoTRIgine 200 MG tablet Commonly known as:  LAMICTAL Take 200 mg by mouth at bedtime.   loperamide 2 MG tablet Commonly known as:  IMODIUM A-D Take 4-6 mg by mouth daily as needed for diarrhea or loose stools.   loratadine 10 MG tablet Commonly known as:  CLARITIN Take 10 mg  by mouth daily as needed (for seasonal allergies).   LORazepam 0.5 MG tablet Commonly known as:  ATIVAN Take 0.5 mg by mouth 2 (two) times daily.   methocarbamol 500 MG tablet Commonly known as:  ROBAXIN Take 1 tablet (500 mg total) by mouth 3 (three) times daily.   NU-IRON 150 MG capsule Generic drug:  iron polysaccharides Take 150 mg by mouth daily.   pantoprazole 40 MG tablet Commonly known as:  PROTONIX Take 40 mg by mouth 2 (two) times daily as needed (acid reflux).   rOPINIRole 2 MG tablet Commonly known as:  REQUIP Take 2 mg by mouth 3 (three) times daily. AT 1500, 1800, and 2100   rOPINIRole 1 MG tablet Commonly known as:  REQUIP Take 1 mg by mouth every morning.   zolpidem 5 MG tablet Commonly known as:  AMBIEN Take 5 mg by mouth at bedtime as needed for sleep.       Discharge Assessment: Vitals:   05/03/17 0510 05/03/17 1552  BP: 120/64 138/75  Pulse: 82 83  Resp: 18   Temp: 98.4 F (36.9 C) 97.8 F (36.6 C)   Skin clean, dry and intact without evidence of skin break down, no evidence of skin tears noted. IV catheter discontinued intact. Site without signs and symptoms of complications -  no redness or edema noted at insertion site, patient denies c/o pain - only slight tenderness at site.  Dressing with slight pressure applied.  D/c Instructions-Education: Discharge instructions given to patient/family with verbalized understanding. D/c education completed with patient/family including follow up instructions, medication list, d/c activities limitations if indicated, with other d/c instructions as indicated by MD - patient able to verbalize understanding, all questions fully answered. Patient instructed to return to ED, call 911, or call MD for any changes in condition.  Patient escorted via [place], and D/C home via private auto.  Salley Slaughter, RN 05/03/2017 4:01 PM

## 2017-05-03 NOTE — Discharge Instructions (Signed)
Procedure scheduled on following date and time, to performed by Dr Donzetta Matters.  Please remain NPO after midnight on Sunday 05/05/2017. Please return to hospital on 2nd floor, at short stay.   Date: 05/06/2017 Time: [phone]

## 2017-05-03 NOTE — Evaluation (Signed)
Physical Therapy Evaluation Patient Details Name: [name] May MRN: [id number] DOB: 12-30-1940 Today's Date: 05/03/2017   History of Present Illness  Pt is a 76 y/o female admitted secondary to LLE cellulitis. Venous US revealed LLE DVT and pt currently on lovenox. PMH includes recurrent DVT, chronic pain, scoliosis, anxiety with mood disorder, bipolar disorder, and PE s/p filter placement.   Clinical Impression  Pt admitted for problem above with deficits below. PTA, pt was independent with functional mobility with use of cane. Upon evaluation, pt limited in activity tolerance by pain secondary to DVT and cellulitis, weakness, and decreased balance. Gait without AD required min guard for safety, and gait with RW required supervision. Educated to use rollator at home for safety with mobility. Pt scheduled for venogram on Monday as outpatient procedure. Daughter available to provide 24/7 assist until after procedure and then will be going on trip. Recommending HHPT and one time safety eval for HHOT to ensure safety with mobility at home following venogram procedure. Pt has all DME at home. Will continue to follow acutely to maximize functional mobility independence.     Follow Up Recommendations Home health PT ([place] (1 time for safety eval))    Equipment Recommendations  None recommended by PT    Recommendations for Other Services       Precautions / Restrictions Precautions Precautions: Other (comment) Precaution Comments: DVT--increase activity slowly.  Restrictions Weight Bearing Restrictions: No      Mobility  Bed Mobility               General bed mobility comments: In bathroom upon entry.   Transfers Overall transfer level: Needs assistance Equipment used: None Transfers: Sit to/from Stand Sit to Stand: Supervision         General transfer comment: Supervision for safety   Ambulation/Gait Ambulation/Gait assistance: Min guard;Supervision Ambulation Distance  (Feet): 50 Feet Assistive device: Rolling walker (2 wheeled);None Gait Pattern/deviations: Step-through pattern;Decreased stride length;Decreased weight shift to left;Antalgic Gait velocity: Decreased Gait velocity interpretation: Below normal speed for age/gender General Gait Details: Gait without AD from bathroom to bed. Pt slow, and unsteady requiring min guard for safety. Use of RW for remainder of gait, and pt with increased steadiness requiring supervision for safety. Antalgic gait secondary to pain in LLE. Educated about slowly increasing mobility until after procedure to address DVT. After procedure, educated to ensure increased activity to prevent future DVT.   Stairs            Wheelchair Mobility    Modified Rankin (Stroke Patients Only)       Balance Overall balance assessment: Needs assistance Sitting-balance support: No upper extremity supported;Feet supported Sitting balance-Leahy Scale: Good     Standing balance support: No upper extremity supported;Bilateral upper extremity supported;During functional activity Standing balance-Leahy Scale: Fair Standing balance comment: able to maintain static standing without use of UE.                              Pertinent Vitals/Pain Pain Assessment: 0-10 Pain Score: 7  Pain Location: LLE  Pain Descriptors / Indicators: Aching Pain Intervention(s): Limited activity within patient's tolerance;Monitored during session;Repositioned    Home Living Family/patient expects to be discharged to:: Private residence Living Arrangements: Alone Available Help at Discharge: Family;Available 24 hours/day Type of Home: Apartment Home Access: Stairs to enter Entrance Stairs-Rails: None Entrance Stairs-Number of Steps: 1 (threshold ) Home Layout: One level Home Equipment: Walker - 4 wheels;Cane -  single point Additional Comments: Daughter available 24/7 until Tuesday.     Prior Function Level of Independence:  Independent with assistive device(s)         Comments: USed the cane at baseline      Hand Dominance   Dominant Hand: Right    Extremity/Trunk Assessment   Upper Extremity Assessment Upper Extremity Assessment: Generalized weakness    Lower Extremity Assessment Lower Extremity Assessment: LLE deficits/detail;Generalized weakness LLE Deficits / Details: DVT on Lovenox. LLE cellulitis.     Cervical / Trunk Assessment Cervical / Trunk Assessment: Other exceptions Cervical / Trunk Exceptions: scoliosis   Communication   Communication: No difficulties  Cognition Arousal/Alertness: Awake/alert Behavior During Therapy: WFL for tasks assessed/performed Overall Cognitive Status: Within Functional Limits for tasks assessed                                        General Comments General comments (skin integrity, edema, etc.): Educated about having daughter stay with pt 24/7 initially to ensure safety with mobility and to monitor pt for abnormal signs and symptoms such as chest pain and SOB. Educated about HHPT and [place] recommendations to ensure safety with mobility after venogram performed. Pt and daughter agreeable.     Exercises     Assessment/Plan    PT Assessment Patient needs continued PT services  PT Problem List Decreased strength;Decreased range of motion;Decreased mobility;Decreased balance;Decreased knowledge of use of DME;Decreased knowledge of precautions;Pain       PT Treatment Interventions DME instruction;Gait training;Stair training;Functional mobility training;Therapeutic activities;Therapeutic exercise;Balance training;Neuromuscular re-education;Patient/family education    PT Goals (Current goals can be found in the Care Plan section)  Acute Rehab PT Goals Patient Stated Goal: to go home  PT Goal Formulation: With patient Time For Goal Achievement: 05/10/17 Potential to Achieve Goals: Good    Frequency Min 3X/week   Barriers to  discharge Decreased caregiver support Daughter available 24/7 until tuesday. Lives alone otherwise. Reported friends could check in intermittently when daughter not available.     Co-evaluation               AM-PAC PT "6 Clicks" Daily Activity  Outcome Measure Difficulty turning over in bed (including adjusting bedclothes, sheets and blankets)?: A Little Difficulty moving from lying on back to sitting on the side of the bed? : A Little Difficulty sitting down on and standing up from a chair with arms (e.g., wheelchair, bedside commode, etc,.)?: None Help needed moving to and from a bed to chair (including a wheelchair)?: A Little Help needed walking in hospital room?: A Little Help needed climbing 3-5 steps with a railing? : A Little 6 Click Score: 19    End of Session Equipment Utilized During Treatment: Gait belt Activity Tolerance: Patient tolerated treatment well Patient left: in bed;with call bell/phone within reach;with family/visitor present Nurse Communication: Mobility status PT Visit Diagnosis: Unsteadiness on feet (R26.81)    Time: 2563-8937 PT Time Calculation (min) (ACUTE ONLY): 17 min   Charges:   PT Evaluation $PT Eval Moderate Complexity: 1 Procedure PT Treatments $Gait Training: 8-22 mins   PT G Codes:        Leighton Ruff, PT, DPT  Acute Rehabilitation Services  Pager: [phone]   Rudean Hitt 05/03/2017, 3:20 PM

## 2017-05-06 ENCOUNTER — Inpatient Hospital Stay (HOSPITAL_COMMUNITY): Payer: Medicare HMO | Admitting: Certified Registered Nurse Anesthetist

## 2017-05-06 ENCOUNTER — Encounter (HOSPITAL_COMMUNITY)
Admission: RE | Disposition: A | Discharge status: Home / Self Care | Payer: Self-pay | Source: Ambulatory Visit | Attending: Vascular Surgery

## 2017-05-06 ENCOUNTER — Observation Stay (HOSPITAL_COMMUNITY)
Admission: RE | Admit: 2017-05-06 | Discharge: 2017-05-07 | Disposition: A | Discharge status: Home / Self Care | Payer: Medicare HMO | Source: Ambulatory Visit | Attending: Vascular Surgery | Admitting: Vascular Surgery

## 2017-05-06 ENCOUNTER — Encounter (HOSPITAL_COMMUNITY): Payer: Self-pay | Admitting: *Deleted

## 2017-05-06 DIAGNOSIS — Z881 Allergy status to other antibiotic agents status: Secondary | ICD-10-CM | POA: Insufficient documentation

## 2017-05-06 DIAGNOSIS — M1712 Unilateral primary osteoarthritis, left knee: Secondary | ICD-10-CM | POA: Diagnosis not present

## 2017-05-06 DIAGNOSIS — Z91040 Latex allergy status: Secondary | ICD-10-CM | POA: Diagnosis not present

## 2017-05-06 DIAGNOSIS — I82409 Acute embolism and thrombosis of unspecified deep veins of unspecified lower extremity: Secondary | ICD-10-CM | POA: Diagnosis present

## 2017-05-06 DIAGNOSIS — K219 Gastro-esophageal reflux disease without esophagitis: Secondary | ICD-10-CM | POA: Insufficient documentation

## 2017-05-06 DIAGNOSIS — D649 Anemia, unspecified: Secondary | ICD-10-CM | POA: Insufficient documentation

## 2017-05-06 DIAGNOSIS — E785 Hyperlipidemia, unspecified: Secondary | ICD-10-CM | POA: Insufficient documentation

## 2017-05-06 DIAGNOSIS — M419 Scoliosis, unspecified: Secondary | ICD-10-CM | POA: Insufficient documentation

## 2017-05-06 DIAGNOSIS — L03116 Cellulitis of left lower limb: Secondary | ICD-10-CM | POA: Insufficient documentation

## 2017-05-06 DIAGNOSIS — I82521 Chronic embolism and thrombosis of right iliac vein: Secondary | ICD-10-CM | POA: Insufficient documentation

## 2017-05-06 DIAGNOSIS — I739 Peripheral vascular disease, unspecified: Secondary | ICD-10-CM | POA: Diagnosis not present

## 2017-05-06 DIAGNOSIS — N179 Acute kidney failure, unspecified: Secondary | ICD-10-CM | POA: Diagnosis not present

## 2017-05-06 DIAGNOSIS — F319 Bipolar disorder, unspecified: Secondary | ICD-10-CM | POA: Diagnosis not present

## 2017-05-06 DIAGNOSIS — I2699 Other pulmonary embolism without acute cor pulmonale: Secondary | ICD-10-CM | POA: Insufficient documentation

## 2017-05-06 DIAGNOSIS — Z87891 Personal history of nicotine dependence: Secondary | ICD-10-CM | POA: Diagnosis not present

## 2017-05-06 DIAGNOSIS — I82512 Chronic embolism and thrombosis of left femoral vein: Secondary | ICD-10-CM | POA: Diagnosis not present

## 2017-05-06 DIAGNOSIS — I824Y2 Acute embolism and thrombosis of unspecified deep veins of left proximal lower extremity: Secondary | ICD-10-CM | POA: Diagnosis not present

## 2017-05-06 DIAGNOSIS — I82412 Acute embolism and thrombosis of left femoral vein: Secondary | ICD-10-CM | POA: Diagnosis present

## 2017-05-06 HISTORY — DX: Gastro-esophageal reflux disease without esophagitis: K21.9

## 2017-05-06 HISTORY — PX: INTRAVASCULAR ULTRASOUND/IVUS: CATH118244

## 2017-05-06 HISTORY — DX: Unspecified osteoarthritis, unspecified site: M19.90

## 2017-05-06 LAB — TYPE AND SCREEN
ABO/RH(D): A POS
Antibody Screen: NEGATIVE

## 2017-05-06 LAB — SURGICAL PCR SCREEN
MRSA, PCR: NEGATIVE
Staphylococcus aureus: NEGATIVE

## 2017-05-06 SURGERY — VENOGRAM
Anesthesia: General

## 2017-05-06 MED ORDER — PROPOFOL 10 MG/ML IV BOLUS
INTRAVENOUS | Status: DC | PRN
  Administered 2017-05-06: 100 mg via INTRAVENOUS

## 2017-05-06 MED ORDER — FENTANYL CITRATE (PF) 100 MCG/2ML IJ SOLN
INTRAMUSCULAR | Status: DC | PRN
Start: 2017-05-06 — End: 2017-05-06
  Administered 2017-05-06 (×2): 50 ug via INTRAVENOUS

## 2017-05-06 MED ORDER — ASPIRIN EC 325 MG PO TBEC
325.0000 mg | DELAYED_RELEASE_TABLET | Freq: Every day | ORAL | Status: DC
  Administered 2017-05-06 – 2017-05-07 (×2): 325 mg via ORAL
  Filled 2017-05-06 (×2): qty 1

## 2017-05-06 MED ORDER — LAMOTRIGINE 25 MG PO TABS
200.0000 mg | ORAL_TABLET | Freq: Every day | ORAL | Status: DC
  Administered 2017-05-06: 200 mg via ORAL
  Filled 2017-05-06: qty 8

## 2017-05-06 MED ORDER — DEXAMETHASONE SODIUM PHOSPHATE 10 MG/ML IJ SOLN
INTRAMUSCULAR | Status: DC | PRN
  Administered 2017-05-06: 10 mg via INTRAVENOUS

## 2017-05-06 MED ORDER — PHENYLEPHRINE 40 MCG/ML (10ML) SYRINGE FOR IV PUSH (FOR BLOOD PRESSURE SUPPORT)
PREFILLED_SYRINGE | INTRAVENOUS | Status: DC | PRN
  Administered 2017-05-06: 80 ug via INTRAVENOUS
  Administered 2017-05-06: 120 ug via INTRAVENOUS

## 2017-05-06 MED ORDER — ROPINIROLE HCL 1 MG PO TABS
1.0000 mg | ORAL_TABLET | Freq: Every morning | ORAL | Status: DC
  Administered 2017-05-07: 1 mg via ORAL
  Filled 2017-05-06: qty 1

## 2017-05-06 MED ORDER — LORAZEPAM 0.5 MG PO TABS
0.5000 mg | ORAL_TABLET | Freq: Two times a day (BID) | ORAL | Status: DC
  Administered 2017-05-06 – 2017-05-07 (×2): 0.5 mg via ORAL
  Filled 2017-05-06 (×2): qty 1

## 2017-05-06 MED ORDER — ACETAMINOPHEN 325 MG PO TABS: 650.0000 mg | ORAL_TABLET | ORAL | Status: DC | PRN

## 2017-05-06 MED ORDER — BUPROPION HCL ER (XL) 300 MG PO TB24
300.0000 mg | ORAL_TABLET | Freq: Every day | ORAL | Status: DC
  Administered 2017-05-06 – 2017-05-07 (×2): 300 mg via ORAL
  Filled 2017-05-06 (×2): qty 1

## 2017-05-06 MED ORDER — IODIXANOL 320 MG/ML IV SOLN
INTRAVENOUS | Status: DC | PRN
  Administered 2017-05-06: 100 mL via INTRAVENOUS

## 2017-05-06 MED ORDER — VITAMIN C 250 MG PO TABS
250.0000 mg | ORAL_TABLET | Freq: Two times a day (BID) | ORAL | Status: DC
  Administered 2017-05-06 – 2017-05-07 (×2): 250 mg via ORAL
  Filled 2017-05-06 (×2): qty 1

## 2017-05-06 MED ORDER — ROPINIROLE HCL 1 MG PO TABS
2.0000 mg | ORAL_TABLET | ORAL | Status: DC
  Administered 2017-05-06 – 2017-05-07 (×4): 2 mg via ORAL
  Filled 2017-05-06 (×4): qty 2

## 2017-05-06 MED ORDER — ENOXAPARIN SODIUM 80 MG/0.8ML ~~LOC~~ SOLN: 70.0000 mg | Freq: Two times a day (BID) | SUBCUTANEOUS | Status: DC

## 2017-05-06 MED ORDER — MUPIROCIN 2 % EX OINT
1.0000 "application " | TOPICAL_OINTMENT | Freq: Once | CUTANEOUS | Status: AC
  Administered 2017-05-06: 1 via TOPICAL

## 2017-05-06 MED ORDER — POLYSACCHARIDE IRON COMPLEX 150 MG PO CAPS
150.0000 mg | ORAL_CAPSULE | Freq: Every day | ORAL | Status: DC
  Administered 2017-05-07: 150 mg via ORAL
  Filled 2017-05-06: qty 1

## 2017-05-06 MED ORDER — HEPARIN SODIUM (PORCINE) 1000 UNIT/ML IJ SOLN
INTRAMUSCULAR | Status: AC
  Filled 2017-05-06: qty 1

## 2017-05-06 MED ORDER — MAGNESIUM OXIDE 400 (241.3 MG) MG PO TABS
400.0000 mg | ORAL_TABLET | Freq: Two times a day (BID) | ORAL | Status: DC
  Administered 2017-05-06 – 2017-05-07 (×2): 400 mg via ORAL
  Filled 2017-05-06 (×2): qty 1

## 2017-05-06 MED ORDER — MIDAZOLAM HCL 2 MG/2ML IJ SOLN
INTRAMUSCULAR | Status: DC | PRN
  Administered 2017-05-06: 2 mg via INTRAVENOUS

## 2017-05-06 MED ORDER — LACTATED RINGERS IV SOLN
INTRAVENOUS | Status: DC
  Administered 2017-05-06 (×3): via INTRAVENOUS

## 2017-05-06 MED ORDER — SUGAMMADEX SODIUM 200 MG/2ML IV SOLN
INTRAVENOUS | Status: DC | PRN
  Administered 2017-05-06: 147 mg via INTRAVENOUS

## 2017-05-06 MED ORDER — ARTIFICIAL TEARS OPHTHALMIC OINT
TOPICAL_OINTMENT | OPHTHALMIC | Status: DC | PRN
  Administered 2017-05-06: 1 via OPHTHALMIC

## 2017-05-06 MED ORDER — ROCURONIUM BROMIDE 100 MG/10ML IV SOLN
INTRAVENOUS | Status: DC | PRN
  Administered 2017-05-06: 40 mg via INTRAVENOUS

## 2017-05-06 MED ORDER — MORPHINE SULFATE (PF) 4 MG/ML IV SOLN
INTRAVENOUS | Status: AC
  Administered 2017-05-06: 2 mg
  Filled 2017-05-06: qty 1

## 2017-05-06 MED ORDER — ENOXAPARIN SODIUM 80 MG/0.8ML ~~LOC~~ SOLN
70.0000 mg | Freq: Two times a day (BID) | SUBCUTANEOUS | Status: DC
  Administered 2017-05-06 – 2017-05-07 (×2): 70 mg via SUBCUTANEOUS
  Filled 2017-05-06 (×2): qty 0.8

## 2017-05-06 MED ORDER — MUPIROCIN 2 % EX OINT
TOPICAL_OINTMENT | CUTANEOUS | Status: AC
  Administered 2017-05-06: 1 via TOPICAL
  Filled 2017-05-06: qty 22

## 2017-05-06 MED ORDER — GABAPENTIN 600 MG PO TABS
600.0000 mg | ORAL_TABLET | ORAL | Status: DC
  Administered 2017-05-06 – 2017-05-07 (×4): 600 mg via ORAL
  Filled 2017-05-06 (×4): qty 1

## 2017-05-06 MED ORDER — MIDAZOLAM HCL 2 MG/2ML IJ SOLN
INTRAMUSCULAR | Status: AC
  Filled 2017-05-06: qty 2

## 2017-05-06 MED ORDER — DOXYCYCLINE HYCLATE 100 MG PO TABS
100.0000 mg | ORAL_TABLET | Freq: Two times a day (BID) | ORAL | Status: DC
  Administered 2017-05-06 – 2017-05-07 (×2): 100 mg via ORAL
  Filled 2017-05-06 (×2): qty 1

## 2017-05-06 MED ORDER — MORPHINE SULFATE (PF) 2 MG/ML IV SOLN: 2.0000 mg | INTRAVENOUS | Status: DC | PRN

## 2017-05-06 MED ORDER — CEFAZOLIN SODIUM-DEXTROSE 2-4 GM/100ML-% IV SOLN
2.0000 g | Freq: Once | INTRAVENOUS | Status: AC
  Administered 2017-05-06: 2 g via INTRAVENOUS

## 2017-05-06 MED ORDER — HEPARIN SODIUM (PORCINE) 5000 UNIT/ML IJ SOLN
INTRAMUSCULAR | Status: DC | PRN
  Administered 2017-05-06: 11:00:00 500 mL

## 2017-05-06 MED ORDER — PHENYLEPHRINE HCL 10 MG/ML IJ SOLN
INTRAMUSCULAR | Status: DC | PRN
  Administered 2017-05-06: 20 ug/min via INTRAVENOUS

## 2017-05-06 MED ORDER — HEPARIN SODIUM (PORCINE) 1000 UNIT/ML IJ SOLN
INTRAMUSCULAR | Status: DC | PRN
  Administered 2017-05-06: 10000 [IU] via INTRAVENOUS

## 2017-05-06 MED ORDER — ONDANSETRON HCL 4 MG/2ML IJ SOLN: 4.0000 mg | Freq: Four times a day (QID) | INTRAMUSCULAR | Status: DC | PRN

## 2017-05-06 MED ORDER — SODIUM CHLORIDE 0.9 % IV SOLN
INTRA_ARTERIAL | Status: DC
  Filled 2017-05-06: qty 100

## 2017-05-06 MED ORDER — OXYCODONE-ACETAMINOPHEN 5-325 MG PO TABS
1.0000 | ORAL_TABLET | ORAL | Status: DC | PRN
  Administered 2017-05-06: 1 via ORAL
  Administered 2017-05-06 – 2017-05-07 (×3): 2 via ORAL
  Filled 2017-05-06 (×3): qty 2

## 2017-05-06 MED ORDER — METHOCARBAMOL 500 MG PO TABS
500.0000 mg | ORAL_TABLET | Freq: Three times a day (TID) | ORAL | Status: DC
  Administered 2017-05-06 – 2017-05-07 (×4): 500 mg via ORAL
  Filled 2017-05-06 (×4): qty 1

## 2017-05-06 MED ORDER — PANTOPRAZOLE SODIUM 40 MG PO TBEC
40.0000 mg | DELAYED_RELEASE_TABLET | Freq: Two times a day (BID) | ORAL | Status: DC | PRN
  Administered 2017-05-07: 40 mg via ORAL
  Filled 2017-05-06: qty 1

## 2017-05-06 MED ORDER — LIDOCAINE 2% (20 MG/ML) 5 ML SYRINGE
INTRAMUSCULAR | Status: DC | PRN
  Administered 2017-05-06: 70 mg via INTRAVENOUS

## 2017-05-06 MED ORDER — LOPERAMIDE HCL 2 MG PO CAPS: 4.0000 mg | ORAL_CAPSULE | Freq: Every day | ORAL | Status: DC | PRN

## 2017-05-06 MED ORDER — SODIUM CHLORIDE 0.9 % IV SOLN
INTRAVENOUS | Status: DC
  Administered 2017-05-06 – 2017-05-07 (×4): via INTRAVENOUS

## 2017-05-06 MED ORDER — OXYCODONE-ACETAMINOPHEN 5-325 MG PO TABS
ORAL_TABLET | ORAL | Status: AC
  Filled 2017-05-06: qty 1

## 2017-05-06 MED ORDER — ONDANSETRON HCL 4 MG/2ML IJ SOLN
INTRAMUSCULAR | Status: DC | PRN
  Administered 2017-05-06: 4 mg via INTRAVENOUS

## 2017-05-06 MED ORDER — DICYCLOMINE HCL 20 MG PO TABS
20.0000 mg | ORAL_TABLET | Freq: Four times a day (QID) | ORAL | Status: DC | PRN
Start: 2017-05-06 — End: 2017-05-08
  Filled 2017-05-06: qty 1

## 2017-05-06 MED ORDER — 0.9 % SODIUM CHLORIDE (POUR BTL) OPTIME
TOPICAL | Status: DC | PRN
  Administered 2017-05-06: 1000 mL

## 2017-05-06 MED ORDER — LORATADINE 10 MG PO TABS: 10.0000 mg | ORAL_TABLET | Freq: Every day | ORAL | Status: DC | PRN

## 2017-05-06 MED ORDER — FENTANYL CITRATE (PF) 250 MCG/5ML IJ SOLN
INTRAMUSCULAR | Status: AC
  Filled 2017-05-06: qty 5

## 2017-05-06 SURGICAL SUPPLY — 65 items
ADH SKN CLS APL DERMABOND .7 (GAUZE/BANDAGES/DRESSINGS) ×2
BAG BANDED W/RUBBER/TAPE 36X54 (MISCELLANEOUS) ×4 IMPLANT
BAG DECANTER FOR FLEXI CONT (MISCELLANEOUS) ×3 IMPLANT
BAG EQP BAND 135X91 W/RBR TAPE (MISCELLANEOUS) ×2
BANDAGE ACE 4X5 VEL STRL LF (GAUZE/BANDAGES/DRESSINGS) IMPLANT
BLADE SURG 11 STRL SS (BLADE) ×4 IMPLANT
CATH ANGIO 5F BER2 100CM (CATHETERS) ×3 IMPLANT
CATH ANGIO 5F BER2 65CM (CATHETERS) ×4 IMPLANT
CATH OMNI FLUSH .035X70CM (CATHETERS) IMPLANT
CATH QUICKCROSS SUPP .035X90CM (MICROCATHETER) ×3 IMPLANT
CATH VISIONS PV .035 IVUS (CATHETERS) ×3 IMPLANT
COVER BACK TABLE 60X90IN (DRAPES) ×5 IMPLANT
COVER DOME SNAP 22 D (MISCELLANEOUS) ×4 IMPLANT
COVER MAYO STAND STRL (DRAPES) ×1 IMPLANT
COVER PROBE W GEL 5X96 (DRAPES) ×4 IMPLANT
COVER SURGICAL LIGHT HANDLE (MISCELLANEOUS) ×4 IMPLANT
DERMABOND ADVANCED (GAUZE/BANDAGES/DRESSINGS) ×2
DERMABOND ADVANCED .7 DNX12 (GAUZE/BANDAGES/DRESSINGS) ×2 IMPLANT
DEVICE TORQUE KENDALL .025-038 (MISCELLANEOUS) IMPLANT
DRAPE FEMORAL ANGIO 80X135IN (DRAPES) ×4 IMPLANT
DRSG TEGADERM 4X4.75 (GAUZE/BANDAGES/DRESSINGS) ×3 IMPLANT
GAUZE SPONGE 2X2 8PLY STRL LF (GAUZE/BANDAGES/DRESSINGS) ×2 IMPLANT
GAUZE SPONGE 4X4 12PLY STRL LF (GAUZE/BANDAGES/DRESSINGS) ×3 IMPLANT
GLIDEWIRE ANGLED SS 035X260CM (WIRE) ×3 IMPLANT
GLOVE BIO SURGEON STRL SZ7.5 (GLOVE) ×4 IMPLANT
GLOVE BIOGEL PI IND STRL 6.5 (GLOVE) ×1 IMPLANT
GLOVE BIOGEL PI INDICATOR 6.5 (GLOVE) ×2
GOWN STRL REUS W/ TWL LRG LVL3 (GOWN DISPOSABLE) ×5 IMPLANT
GOWN STRL REUS W/ TWL XL LVL3 (GOWN DISPOSABLE) ×2 IMPLANT
GOWN STRL REUS W/TWL LRG LVL3 (GOWN DISPOSABLE) ×12
GOWN STRL REUS W/TWL XL LVL3 (GOWN DISPOSABLE) ×4
GUIDEWIRE AMPLATZ SS .035X260 (WIRE) IMPLANT
GUIDEWIRE ANGLED .035X150CM (WIRE) IMPLANT
KIT BASIN OR (CUSTOM PROCEDURE TRAY) ×4 IMPLANT
KIT ROOM TURNOVER OR (KITS) ×4 IMPLANT
NDL 18GX1X1/2 (RX/OR ONLY) (NEEDLE) IMPLANT
NDL PERC 18GX7CM (NEEDLE) ×1 IMPLANT
NEEDLE 18GX1X1/2 (RX/OR ONLY) (NEEDLE) ×4 IMPLANT
NEEDLE PERC 18GX7CM (NEEDLE) IMPLANT
NS IRRIG 1000ML POUR BTL (IV SOLUTION) ×4 IMPLANT
PACK GENERAL/GYN (CUSTOM PROCEDURE TRAY) ×4 IMPLANT
PAD ARMBOARD 7.5X6 YLW CONV (MISCELLANEOUS) ×8 IMPLANT
PROTECTION STATION PRESSURIZED (MISCELLANEOUS)
SET MICROPUNCTURE 5F STIFF (MISCELLANEOUS) ×10 IMPLANT
SET ZELANTE DVT THROMB (CATHETERS) ×4 IMPLANT
SHEATH AVANTI 11CM 5FR (MISCELLANEOUS) ×3 IMPLANT
SHEATH AVANTI 11CM 8FR (MISCELLANEOUS) ×4 IMPLANT
SHEATH HIGHFLEX ANSEL 6FRX55 (SHEATH) ×3 IMPLANT
SHEATH PINNACLE 9F 10CM (SHEATH) IMPLANT
SPONGE GAUZE 2X2 STER 10/PKG (GAUZE/BANDAGES/DRESSINGS) ×4
STATION PROTECTION PRESSURIZED (MISCELLANEOUS) ×1 IMPLANT
SYR 10ML LL (SYRINGE) ×12 IMPLANT
SYR 20CC LL (SYRINGE) ×8 IMPLANT
SYR 30ML LL (SYRINGE) ×4 IMPLANT
SYR 5ML LL (SYRINGE) ×3 IMPLANT
SYR MEDRAD MARK V 150ML (SYRINGE) IMPLANT
TOWEL OR 17X24 6PK STRL BLUE (TOWEL DISPOSABLE) ×4 IMPLANT
TOWEL OR 17X26 10 PK STRL BLUE (TOWEL DISPOSABLE) ×4 IMPLANT
TRAY FOLEY BAG SILVER LF 16FR (SET/KITS/TRAYS/PACK) ×3 IMPLANT
TUBING EXTENTION W/L.L. (IV SETS) IMPLANT
TUBING HIGH PRESSURE 120CM (CONNECTOR) ×4 IMPLANT
UNDERPAD 30X30 (UNDERPADS AND DIAPERS) IMPLANT
WATER STERILE IRR 1000ML POUR (IV SOLUTION) IMPLANT
WIRE BENTSON .035X145CM (WIRE) ×7 IMPLANT
WIRE HI TORQ VERSACORE J 260CM (WIRE) ×3 IMPLANT

## 2017-05-06 NOTE — Op Note (Signed)
    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 Donzetta Matters, MD Procedure Performed: 1.  US guided cannulation bilateral popliteal veins 2.  Bilateral lower extremity ascending venography  Indications:  76 year old female with history of DVT and IVC filter placement. She has been off of anticoagulation and now presents with left lower extremity DVT and swelling. She also has mild right lower extremity swelling without any DVT on ultrasound. She is indicated for venography and possible intervention.  Findings:  The popliteal veins on the left were very hard cannulate and she has chronic thrombosis as well as what appears to be acute thrombosis in her femoral veins on the left. I could not get into the iliac veins on the left. At that time I cannulated the right popliteal vein performed a sending photography and she indeed does not have clot in her proximal ureter femoral veins on the right. 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 Next  Patient will need CT venogram and consideration of possible angiovac procedure.  Procedure:  The patient was identified in the holding area and taken to the operating room where she was placed supine and general anesthesia was induced. She was then flipped prone given antibiotics and timeout called. She was sterilely prepped and draped in usual fashion for her bilateral popliteal access sheath. We then used ultrasound guidance to cannulate her popliteal vein on the left. She unfortunately had a vein of Giacomini in her small saphenous vein. We were ultimately able to cannulate the left popliteal vein with micropuncture needle and wire and perform acsending venography which showed multiple collaterals. Attempts to get through  collaterals into the deep system but was unable to do this. Ultimately we used ultrasound to cannulate the right popliteal vein. These micropuncture needle followed by a wire placed a 5 French sheath. We performed a sending venography on the right lower extremity. 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 We then removed our sheath held pressure until hemostasis was obtained. Patient was allowed to awaken from anesthesia having tolerated the procedure well.   Contrast: 100cc   Azazel Franze C. Donzetta Matters, MD [facility] Office: [phone] Pager: [phone]

## 2017-05-06 NOTE — Anesthesia Procedure Notes (Signed)
Procedure Name: Intubation Date/Time: 05/06/2017 10:18 AM Performed by: Trixie Deis A Pre-anesthesia Checklist: Patient identified, Emergency Drugs available, Suction available and Patient being monitored Patient Re-evaluated:Patient Re-evaluated prior to inductionOxygen Delivery Method: Circle System Utilized Preoxygenation: Pre-oxygenation with 100% oxygen Intubation Type: IV induction Ventilation: Mask ventilation without difficulty Laryngoscope Size: Mac and 3 Grade View: Grade I Tube type: Oral Tube size: 7.0 mm Number of attempts: 1 Airway Equipment and Method: Stylet and Oral airway Placement Confirmation: ETT inserted through vocal cords under direct vision,  positive ETCO2 and breath sounds checked- equal and bilateral Secured at: 21 cm Tube secured with: Tape Dental Injury: Teeth and Oropharynx as per pre-operative assessment

## 2017-05-06 NOTE — Discharge Summary (Signed)
[facility] Discharge Summary   Patient: [name] May XAJ:[id number]   PCP: Jefm Petty, MD DOB: 1941/02/06   Date of admission: 04/30/2017   Date of discharge: 05/03/2017    Discharge Diagnoses:  Principal Problem:   Cellulitis of left lower extremity Active Problems:   Normocytic anemia   DVT (deep venous thrombosis) (HCC)   Edema of left lower extremity   Cellulitis   Admitted From: home Disposition:  Home   Recommendations for Outpatient Follow-up:  1. Please follow up with PCP in 1 week and return for vascular procedure on Monday    Follow-up Information    Jefm Petty, MD. Schedule an appointment as soon as possible for a visit in 1 week(s).   Specialty:  Family Medicine Contact information: [address] [place] [address] [phone]        [place] Follow up.        [place]. Go on 05/06/2017.   Contact information: [address] [id number] [place] [phone]         Diet recommendation: cardiac diet  Activity: The patient is advised to gradually reintroduce usual activities.  Discharge Condition: good  Code Status: full code  History of present illness: As per the H and P dictated on admission, "[name] May is a 76 y.o. female with history of recurrent DVT who was recently admitted for increasing left leg swelling and was found to have a new DVT and was discharged home on Lovenox with previous history of GI bleed being on xarelto has come to the ER because of increasing pain and erythema of the left lower extremity. Patient started noticing blistering of the skin in the left leg and soon became erythematous with fever and chills. Patient did miss her Lovenox injections for 2 days due to insurance issues after discharge last week."  Hospital Course:  Summary of her active problems in the hospital is as following. 1. Cellulitis of the left leg. Patient  has been started on treatment for cellulitis of the left leg with clindamycin. 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 Finish Antibiotics course.   2. 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 DVT also in left popliteal vein. As well as left iliac vein. A proximal DVT in the right iliac vein is also present. Discussed with vascular surgery,  Patient will require endovascular intervention, will return on Monday.  Continue anticoagulation at present.  3. Chronic pain syndrome. Continuing home regimen for pain. Adding Robaxin to the regimen for leg pain as well.  4. Anxiety and mood disorder. Continue Wellbutrin and Lamictal.  5. Volume overload with leg edema. May require Lasix currently watching clinically.  All other chronic medical condition were stable during the hospitalization.  Patient was seen by physical therapy, who recommended home health, which was arranged by Education officer, museum and case Freight forwarder. On the day of the discharge the patient's vitals were stable, and no other acute medical condition were reported by patient. the patient was felt safe to be discharge at home  with home health.  Procedures and Results:  Lower leg doppler   Consultations:  Vascular surgery  DISCHARGE MEDICATION: Discharge Medication List as of 05/03/2017  6:04 PM    START taking these medications   Details  doxycycline (VIBRA-TABS) 100 MG tablet Take 1 tablet (  100 mg total) by mouth every 12 (twelve) hours., Starting Fri 05/03/2017, Until Mon 05/06/2017, Normal    methocarbamol (ROBAXIN) 500 MG tablet Take 1 tablet (500 mg total) by mouth 3 (three) times daily., Starting Fri 05/03/2017, Normal      CONTINUE these medications which have CHANGED   Details  enoxaparin (LOVENOX) 80 MG/0.8ML injection Inject 0.7  mLs (70 mg total) into the skin every 12 (twelve) hours., Starting Fri 05/03/2017, Normal      CONTINUE these medications which have NOT CHANGED   Details  ascorbic acid (VITAMIN C) 250 MG tablet Take 250 mg by mouth 2 (two) times daily., Historical Med    buPROPion (WELLBUTRIN XL) 300 MG 24 hr tablet Take 300 mg by mouth daily. , Starting 02/25/2014, Until Discontinued, Historical Med    Calcium Carbonate-Vitamin D (CALCIUM + D PO) Take 1 tablet by mouth 2 (two) times daily. , Until Discontinued, Historical Med    dicyclomine (BENTYL) 20 MG tablet Take 20 mg by mouth every 6 (six) hours as needed for spasms., Until Discontinued, Historical Med    gabapentin (NEURONTIN) 600 MG tablet Take 600 mg by mouth 3 (three) times daily. AT 1500, 1800, and 2100, Starting Thu 02/25/2014, Historical Med    hydrocortisone 2.5 % cream Apply 1 application topically daily as needed (eczema). , Starting 01/06/2014, Until Discontinued, Historical Med    iron polysaccharides (NU-IRON) 150 MG capsule Take 150 mg by mouth daily., Until Discontinued, Historical Med    lamoTRIgine (LAMICTAL) 200 MG tablet Take 200 mg by mouth at bedtime. , Starting 02/20/2014, Until Discontinued, Historical Med    loperamide (IMODIUM A-D) 2 MG tablet Take 4-6 mg by mouth daily as needed for diarrhea or loose stools., Until Discontinued, Historical Med    loratadine (CLARITIN) 10 MG tablet Take 10 mg by mouth daily as needed (for seasonal allergies). , Historical Med    LORazepam (ATIVAN) 0.5 MG tablet Take 0.5 mg by mouth 2 (two) times daily., Historical Med    pantoprazole (PROTONIX) 40 MG tablet Take 40 mg by mouth 2 (two) times daily as needed (acid reflux). , Starting 01/24/2014, Until Discontinued, Historical Med    !! rOPINIRole (REQUIP) 1 MG tablet Take 1 mg by mouth every morning. , Starting 12/15/2014, Until Discontinued, Historical Med    !! rOPINIRole (REQUIP) 2 MG tablet Take 2 mg by mouth 3 (three) times daily. AT 1500,  1800, and 2100, Starting Thu 02/25/2014, Historical Med    aspirin EC 325 MG tablet Take 325 mg by mouth at bedtime., Historical Med    zolpidem (AMBIEN) 5 MG tablet Take 5 mg by mouth at bedtime as needed for sleep. , Starting 01/31/2015, Until Discontinued, Historical Med     !! - Potential duplicate medications found. Please discuss with provider.    STOP taking these medications     furosemide (LASIX) 20 MG tablet        Allergies  Allergen Reactions  . Latex Itching, Rash and Other (See Comments)    Blisters for months on legs  . Mold Extract [Trichophyton] Anaphylaxis, Swelling and Rash  . Vancomycin Other (See Comments)    05/01/17: Pt noted to c/o redness and burning to R arm during IV Vancomycin 1g infusion. Infusion stopped and diphenhydramine 25mg  IV given.    Discharge Instructions    Diet - low sodium heart healthy    Complete by:  As directed    Discharge instructions    Complete by:  As directed  It is important that you read following instructions as well as go over your medication list with RN to help you understand your care after this hospitalization.  Discharge Instructions: Please follow-up with PCP in one week  Please request your primary care physician to go over all Hospital Tests and Procedure/Radiological results at the follow up,  Please get all Hospital records sent to your PCP by signing hospital release before you go home.   Do not take more than prescribed Pain, Sleep and Anxiety Medications. You were cared for by a hospitalist during your hospital stay. If you have any questions about your discharge medications or the care you received while you were in the hospital after you are discharged, you can call the unit and ask to speak with the hospitalist on call if the hospitalist that took care of you is not available.  Once you are discharged, your primary care physician will handle any further medical issues. Please note that NO REFILLS for any  discharge medications will be authorized once you are discharged, as it is imperative that you return to your primary care physician (or establish a relationship with a primary care physician if you do not have one) for your aftercare needs so that they can reassess your need for medications and monitor your lab values. You Must read complete instructions/literature along with all the possible adverse reactions/side effects for all the Medicines you take and that have been prescribed to you. Take any new Medicines after you have completely understood and accept all the possible adverse reactions/side effects. Wear Seat belts while driving. If you have smoked or chewed Tobacco in the last 2 yrs please stop smoking and/or stop any Recreational drug use.   Increase activity slowly    Complete by:  As directed      Discharge Exam: Filed Weights   04/30/17 2002  Weight: 73.5 kg (162 lb)   Vitals:   05/03/17 0510 05/03/17 1552  BP: 120/64 138/75  Pulse: 82 83  Resp: 18   Temp: 98.4 F (36.9 C) 97.8 F (36.6 C)   General: Appear in no distress, no Rash; Oral Mucosa moist. Cardiovascular: S1 and S2 Present, no Murmur, no JVD Respiratory: Bilateral Air entry present and Clear to Auscultation, no Crackles, no wheezes Abdomen: Bowel Sound present, Soft and no tenderness Extremities: bilateral Pedal edema, no calf tenderness Neurology: Grossly no focal neuro deficit.  The results of significant diagnostics from this hospitalization (including imaging, microbiology, ancillary and laboratory) are listed below for reference.    Significant Diagnostic Studies: Dg Chest 2 View  Result Date: 04/30/2017 CLINICAL DATA:  Congestive heart failure. EXAM: CHEST  2 VIEW COMPARISON:  Radiographs of April 22, 2017. FINDINGS: Stable cardiomegaly. Atherosclerosis of thoracic aorta is noted. Severe dextroscoliosis of thoracic spine is noted. No pneumothorax or pleural effusion is noted. Right lung is clear. Mild  left basilar atelectasis or scarring is noted. IMPRESSION: Aortic atherosclerosis. Stable cardiomegaly. Mild left basilar subsegmental atelectasis or scarring. Stable hiatal hernia. Electronically Signed   By: Marijo Conception, M.D.   On: 04/30/2017 21:24   Dg Chest 2 View  Result Date: 04/22/2017 CLINICAL DATA:  Left leg swelling. EXAM: CHEST  2 VIEW COMPARISON:  02/06/2015 as well as chest CT 05/26/2015 FINDINGS: Patient is rotated to the right. Lungs are adequately inflated without focal consolidation or effusion. Eventration of the lateral right hemidiaphragm unchanged. Mild stable cardiomegaly. Moderate size hiatal hernia. Calcified plaque over the thoracic aorta. Moderate curvature of the  thoracic spine convex right unchanged diffuse osteopenia present. Degenerative change of the spine. IVC filter is present. IMPRESSION: No acute cardiopulmonary disease. Stable cardiomegaly. Moderate size hiatal hernia. Aortic atherosclerosis. Electronically Signed   By: Marin Olp M.D.   On: 04/22/2017 19:08   US Renal  Result Date: 04/22/2017 CLINICAL DATA:  Acute renal injury EXAM: RENAL / URINARY TRACT ULTRASOUND COMPLETE COMPARISON:  None. FINDINGS: Right Kidney: Length: 9.9 cm. Echogenicity within normal limits. No mass or hydronephrosis visualized. Slight cortical thinning of the right kidney. Left Kidney: Length: 8.8 cm. Echogenicity within normal limits. No mass or hydronephrosis visualized. Bladder: Appears normal for degree of bladder distention. IMPRESSION: No obstructive uropathy of either kidney. Slight cortical thinning of the right kidney. Maintained cortical-medullary differentiation is noted within both kidneys. Electronically Signed   By: Ashley Royalty M.D.   On: 04/22/2017 21:37    Microbiology: Recent Results (from the past 240 hour(s))  Surgical pcr screen     Status: None   Collection Time: 05/06/17  9:54 AM  Result Value Ref Range Status   MRSA, PCR NEGATIVE NEGATIVE Final    Staphylococcus aureus NEGATIVE NEGATIVE Final    Comment:        The Xpert SA Assay (FDA approved for NASAL specimens in patients over 69 years of age), is one component of a comprehensive surveillance program.  Test performance has been validated by [facility] for patients greater than or equal to 40 year old. It is not intended to diagnose infection nor to guide or monitor treatment.      Labs: CBC:  Recent Labs Lab 04/30/17 2018 05/01/17 0824 05/02/17 0616 05/03/17 0519  WBC 8.0 6.5 6.0 5.3  HGB 9.9* 9.3* 9.0* 10.1*  HCT 32.1* 30.6* 29.9* 33.9*  MCV 100.3* 99.7 100.0 102.1*  PLT 390 381 386 194*   Basic Metabolic Panel:  Recent Labs Lab 04/30/17 2018 05/01/17 0824 05/02/17 0616 05/03/17 0519  NA 137 136 137 138  K 4.0 3.6 3.4* 3.7  CL 103 104 104 104  CO2 27 27 29 27   GLUCOSE 88 81 102* 83  BUN 13 11 7 7   CREATININE 1.10* 0.94 0.79 0.82  CALCIUM 8.7* 8.6* 8.6* 8.9  MG  --   --  2.0  --    BNP (last 3 results)  Recent Labs  04/30/17 2018  BNP 47.7   CBG: No results for input(s): GLUCAP in the last 168 hours. Time spent: 35 minutes  Signed:  Berle Mull  [facility] 05/03/2017 , 2:14 PM

## 2017-05-06 NOTE — Anesthesia Preprocedure Evaluation (Addendum)
Anesthesia Evaluation  Patient identified by MRN, date of birth, ID band Patient awake    Reviewed: Allergy & Precautions, NPO status , Patient's Chart, lab work & pertinent test results  Airway Mallampati: I  TM Distance: >3 FB Neck ROM: Full    Dental  (+) Edentulous Lower, Edentulous Upper   Pulmonary shortness of breath, former smoker,    breath sounds clear to auscultation       Cardiovascular + Peripheral Vascular Disease   Rhythm:Regular Rate:Normal  DVT and PEs hsitory   Neuro/Psych    GI/Hepatic PUD, GERD  ,  Endo/Other    Renal/GU      Musculoskeletal  (+) Arthritis ,   Abdominal   Peds  Hematology  (+) anemia ,   Anesthesia Other Findings   Reproductive/Obstetrics                            Anesthesia Physical Anesthesia Plan  ASA: III  Anesthesia Plan: General   Post-op Pain Management:    Induction: Intravenous  PONV Risk Score and Plan: 3 and Ondansetron, Dexamethasone, Propofol and Midazolam  Airway Management Planned: Oral ETT  Additional Equipment:   Intra-op Plan:   Post-operative Plan: Extubation in OR  Informed Consent: I have reviewed the patients History and Physical, chart, labs and discussed the procedure including the risks, benefits and alternatives for the proposed anesthesia with the patient or authorized representative who has indicated his/her understanding and acceptance.     Plan Discussed with: CRNA  Anesthesia Plan Comments:         Anesthesia Quick Evaluation

## 2017-05-06 NOTE — H&P (Signed)
HP    History of Present Illness: This is a 76 y.o. female recently admitted with significant left leg dvt. 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 Has not further gi bleeding. No on blood thinners until recent diagnosis and has since been on tx lovenox last dose yesterday. Continued edema and pain in left leg, right leg also edematous.   Past Medical History:  Diagnosis Date  . Allergy   . Anemia   . Arthritis    left knee  . Back pain   . Bipolar disorder ([place])   . Chronic headaches   . Degenerative disorder of bone   . Depression   . Esophageal ulcer    history  . GERD (gastroesophageal reflux disease)   . Hyperlipidemia   . Pulmonary embolism ([place])   . Scoliosis     Past Surgical History:  Procedure Laterality Date  . ABDOMINAL HYSTERECTOMY    . ANKLE SURGERY    . COLONOSCOPY    . EYE SURGERY     caratacs  . hemroidectomy    . LAMINECTOMY    . NASAL SINUS SURGERY      Allergies  Allergen Reactions  . Latex Itching, Rash and Other (See Comments)    Blisters for months on legs  . Mold Extract [Trichophyton] Anaphylaxis, Swelling and Rash  . Vancomycin Other (See Comments)    05/01/17: Pt noted to c/o redness and burning to R arm during IV Vancomycin 1g infusion. Infusion stopped and diphenhydramine 25mg  IV given.     Prior to Admission medications   Medication Sig Start Date End Date Taking? Authorizing Provider  ascorbic acid (VITAMIN C) 250 MG tablet Take 250 mg by mouth 2 (two) times daily.   Yes [provider]  buPROPion (WELLBUTRIN XL) 300 MG 24 hr tablet Take 300 mg by mouth daily.  02/25/14  Yes [provider]  Calcium Carbonate-Vitamin D (CALCIUM + D PO) Take 1 tablet by mouth daily.    Yes [provider]  doxycycline (VIBRA-TABS) 100 MG tablet Take 1 tablet (100 mg total) by mouth every 12 (twelve) hours. 05/03/17 05/06/17 Yes Lavina Hamman, MD  enoxaparin (LOVENOX) 80 MG/0.8ML injection Inject 0.7 mLs  (70 mg total) into the skin every 12 (twelve) hours. 05/03/17  Yes Lavina Hamman, MD  gabapentin (NEURONTIN) 600 MG tablet Take 600 mg by mouth 3 (three) times daily. AT 1500, 1800, and 2100 02/25/14  Yes [provider]  iron polysaccharides (NU-IRON) 150 MG capsule Take 150 mg by mouth daily.   Yes [provider]  lamoTRIgine (LAMICTAL) 200 MG tablet Take 200 mg by mouth at bedtime.  02/20/14  Yes [provider]  LORazepam (ATIVAN) 0.5 MG tablet Take 0.5 mg by mouth 2 (two) times daily.   Yes [provider]  MAGNESIUM-OXIDE 400 (241.3 Mg) MG tablet Take 400 mg by mouth 2 (two) times daily. 04/30/17  Yes [provider]  methocarbamol (ROBAXIN) 500 MG tablet Take 1 tablet (500 mg total) by mouth 3 (three) times daily. 05/03/17  Yes Lavina Hamman, MD  pantoprazole (PROTONIX) 40 MG tablet Take 40 mg by mouth 2 (two) times daily as needed (acid reflux).  01/24/14  Yes [provider]  rOPINIRole (REQUIP) 1 MG tablet Take 1 mg by mouth every morning.  12/15/14  Yes [provider]  rOPINIRole (REQUIP) 2 MG tablet Take 2 mg by mouth 3 (three) times daily. AT 1500, 1800, and 2100  02/25/14  Yes [provider]  dicyclomine (BENTYL) 20 MG tablet Take 20 mg by mouth every 6 (six) hours as needed for spasms.    [provider]  hydrocortisone 2.5 % cream Apply 1 application topically daily as needed (eczema).  01/06/14   [provider]  loperamide (IMODIUM A-D) 2 MG tablet Take 4-6 mg by mouth daily as needed for diarrhea or loose stools.    [provider]  loratadine (CLARITIN) 10 MG tablet Take 10 mg by mouth daily as needed (for seasonal allergies).     [provider]    Social History   Social History  . Marital status: Widowed    Spouse name: N/A  . Number of children: N/A  . Years of education: N/A   Occupational History  . Not on file.   Social History Main Topics  . Smoking status:  Former Research scientist (life sciences)  . Smokeless tobacco: Never Used     Comment: quit 25 years ago   . Alcohol use No  . Drug use: No  . Sexual activity: Not Currently   Other Topics Concern  . Not on file   Social History Narrative  . No narrative on file    Family History  Problem Relation Age of Onset  . Deep vein thrombosis Neg Hx    REVIEW OF SYSTEMS (negative unless checked):   Cardiac:  []  Chest pain or chest pressure? []  Shortness of breath upon activity? []  Shortness of breath when lying flat? []  Irregular heart rhythm?  Vascular:  []  Pain in calf, thigh, or hip brought on by walking? []  Pain in feet at night that wakes you up from your sleep? [x]  Blood clot in your veins? [x]  Leg swelling?  Pulmonary:  []  Oxygen at home? []  Productive cough? []  Wheezing?  Neurologic:  []  Sudden weakness in arms or legs? []  Sudden numbness in arms or legs? []  Sudden onset of difficult speaking or slurred speech? []  Temporary loss of vision in one eye? []  Problems with dizziness?  Gastrointestinal:  []  Blood in stool? []  Vomited blood?  Genitourinary:  []  Burning when urinating? []  Blood in urine?  Psychiatric:  []  Major depression  Hematologic:  []  Bleeding problems? [x]  Problems with blood clotting?  Dermatologic:  [x]  Rashes or ulcers?  Constitutional:  []  Fever or chills?  Ear/Nose/Throat:  []  Change in hearing? []  Nose bleeds? []  Sore throat?  Musculoskeletal:  []  Back pain? []  Joint pain? []  Muscle pain?    Physical Examination  Vitals:   05/06/17 0859  BP: (!) 148/78  Pulse: (!) 45  Resp: 18  Temp: 98.5 F (36.9 C)   Body mass index is 35.06 kg/m.  General:  WDWN in NAD Gait: Not observed HENT: WNL, normocephalic Pulmonary: normal non-labored breathing Cardiac: rrr Abdomen:  soft, NT/ND, no masses Skin: erythema with thickening left leg Extremities: left leg 41.5cm, right 36 (measured 10cm distal to tuberosity) Musculoskeletal: no muscle  wasting or atrophy  Neurologic: A&O X 3 ; SENSATION: normal; MOTOR FUNCTION:  moving all extremities equally. Speech is fluent/normal Psychiatric:  Appropriate mood and affect  CBC    Component Value Date/Time   WBC 5.3 05/03/2017 0519   RBC 3.32 (L) 05/03/2017 0519   HGB 10.1 (L) 05/03/2017 0519   HCT 33.9 (L) 05/03/2017 0519   PLT 426 (H) 05/03/2017 0519   MCV 102.1 (H) 05/03/2017 0519   MCH 30.4 05/03/2017 0519   MCHC 29.8 (L) 05/03/2017 0519   RDW 14.3 05/03/2017  0519    BMET    Component Value Date/Time   NA 138 05/03/2017 0519   K 3.7 05/03/2017 0519   CL 104 05/03/2017 0519   CO2 27 05/03/2017 0519   GLUCOSE 83 05/03/2017 0519   BUN 7 05/03/2017 0519   CREATININE 0.82 05/03/2017 0519   CALCIUM 8.9 05/03/2017 0519   GFRNONAA >60 05/03/2017 0519   GFRAA >60 05/03/2017 0519    COAGS: Lab Results  Component Value Date   INR 1.14 05/02/2017   INR 2.94 (H) 05/26/2015   INR 2.08 (H) 02/15/2015     Non-Invasive Vascular Imaging:   Summary: Lower extremity duplex:  No evidence of DVT in the right lower extremity, however continuous venous flow is noted. Occlusive DVT noted in the left common femoral, profund femoral, and femoral veins. Partial DVT noted in the left popliteal vein. Iliac duplex: Technically limited due to bowel gas. Appears to be DVT at proximal Right iliac vein. Appears to be DVT in entire Left iliac vein.    ASSESSMENT/PLAN: This is a 76 y.o. female here with extensive left lower extremity dvt. Plan for venogram, intravascular [place], thrombolysis, possible stenting. Discussed risks including bleeding complications including stroke and transfusion, renal failure possibly requiring hd, access complications and pe that could be fatal. She demonstrates good understanding and wishes to proceed in presence of eldest daughter.   Byrne Capek C. Donzetta Matters, MD [facility] [place] Office: [phone] Pager: [phone]

## 2017-05-06 NOTE — Anesthesia Postprocedure Evaluation (Signed)
Anesthesia Post Note  Patient: [name] May  Procedure(s) Performed: Procedure(s) (LRB): Venogram Bilateral  Lower Extremity (N/A) INTRAVASCULAR ULTRASOUND/IVUS (N/A)     Patient location during evaluation: PACU Anesthesia Type: General Level of consciousness: awake and alert Pain management: pain level controlled Vital Signs Assessment: post-procedure vital signs reviewed and stable Respiratory status: spontaneous breathing, nonlabored ventilation, respiratory function stable and patient connected to nasal cannula oxygen Cardiovascular status: blood pressure returned to baseline and stable Postop Assessment: no signs of nausea or vomiting Anesthetic complications: no    Last Vitals:  Vitals:   05/06/17 1625 05/06/17 1630  BP: 118/68 118/68  Pulse: 82   Resp: 12   Temp:  37.2 C    Last Pain:  Vitals:   05/06/17 1445  TempSrc:   PainSc: 2                  Evy Lutterman,JAMES TERRILL

## 2017-05-06 NOTE — Transfer of Care (Signed)
Immediate Anesthesia Transfer of Care Note  Patient: [name] May  Procedure(s) Performed: Procedure(s): Venogram Bilateral  Lower Extremity (N/A) INTRAVASCULAR ULTRASOUND/IVUS (N/A)  Patient Location: PACU  Anesthesia Type:General  Level of Consciousness: awake, alert  and oriented  Airway & Oxygen Therapy: Patient Spontanous Breathing and Patient connected to nasal cannula oxygen  Post-op Assessment: Report given to RN, Post -op Vital signs reviewed and stable and Patient moving all extremities  Post vital signs: Reviewed and stable  Last Vitals:  Vitals:   05/06/17 0859  BP: (!) 148/78  Pulse: (!) 45  Resp: 18  Temp: 36.9 C    Last Pain:  Vitals:   05/06/17 0914  TempSrc:   PainSc: 7       Patients Stated Pain Goal: 3 (19/16/60 6004)  Complications: No apparent anesthesia complications

## 2017-05-06 NOTE — Progress Notes (Signed)
ANTICOAGULATION CONSULT NOTE - Initial Consult  Pharmacy Consult for Lovenox Indication: VTE Treatment  Allergies  Allergen Reactions  . Latex Itching, Rash and Other (See Comments)    Blisters for months on legs  . Mold Extract [Trichophyton] Anaphylaxis, Swelling and Rash  . Vancomycin Other (See Comments)    05/01/17: Pt noted to c/o redness and burning to R arm during IV Vancomycin 1g infusion. Infusion stopped and diphenhydramine 25mg  IV given.    Patient Measurements: Weight: 162 lb (73.5 kg)  Vital Signs: Temp: 98.9 F (37.2 C) (06/25 1630) Temp Source: Oral (06/25 0859) BP: 118/68 (06/25 1630) Pulse Rate: 82 (06/25 1625)  Labs: No results for input(s): HGB, HCT, PLT, APTT, LABPROT, INR, HEPARINUNFRC, HEPRLOWMOCWT, CREATININE, CKTOTAL, CKMB, TROPONINI in the last 72 hours.  Estimated Creatinine Clearance: 50 mL/min (by C-G formula based on SCr of 0.82 mg/dL).  Medical History: Past Medical History:  Diagnosis Date  . Allergy   . Anemia   . Arthritis    left knee  . Back pain   . Bipolar disorder ([place])   . Chronic headaches   . Degenerative disorder of bone   . Depression   . Esophageal ulcer    history  . GERD (gastroesophageal reflux disease)   . Hyperlipidemia   . Pulmonary embolism ([place])   . Scoliosis     Medications:  Prescriptions Prior to Admission  Medication Sig Dispense Refill Last Dose  . ascorbic acid (VITAMIN C) 250 MG tablet Take 250 mg by mouth 2 (two) times daily.   05/05/2017 at Unknown time  . buPROPion (WELLBUTRIN XL) 300 MG 24 hr tablet Take 300 mg by mouth daily.    05/05/2017 at Unknown time  . Calcium Carbonate-Vitamin D (CALCIUM + D PO) Take 1 tablet by mouth daily.    05/05/2017 at Unknown time  . doxycycline (VIBRA-TABS) 100 MG tablet Take 1 tablet (100 mg total) by mouth every 12 (twelve) hours. 6 tablet 0 05/05/2017 at Unknown time  . enoxaparin (LOVENOX) 80 MG/0.8ML injection Inject 0.7 mLs (70 mg total) into the skin every 12  (twelve) hours. 60 Syringe 0 05/05/2017 at 1500  . gabapentin (NEURONTIN) 600 MG tablet Take 600 mg by mouth 3 (three) times daily. AT 1500, 1800, and 2100   05/05/2017 at Unknown time  . iron polysaccharides (NU-IRON) 150 MG capsule Take 150 mg by mouth daily.   05/05/2017 at Unknown time  . lamoTRIgine (LAMICTAL) 200 MG tablet Take 200 mg by mouth at bedtime.    05/05/2017 at Unknown time  . LORazepam (ATIVAN) 0.5 MG tablet Take 0.5 mg by mouth 2 (two) times daily.   05/05/2017 at Unknown time  . MAGNESIUM-OXIDE 400 (241.3 Mg) MG tablet Take 400 mg by mouth 2 (two) times daily.  0 05/05/2017 at Unknown time  . methocarbamol (ROBAXIN) 500 MG tablet Take 1 tablet (500 mg total) by mouth 3 (three) times daily. 30 tablet 0 05/05/2017 at Unknown time  . pantoprazole (PROTONIX) 40 MG tablet Take 40 mg by mouth 2 (two) times daily as needed (acid reflux).    Past Week at Unknown time  . rOPINIRole (REQUIP) 1 MG tablet Take 1 mg by mouth every morning.   1 05/05/2017 at Unknown time  . rOPINIRole (REQUIP) 2 MG tablet Take 2 mg by mouth 3 (three) times daily. AT 1500, 1800, and 2100   05/05/2017 at Unknown time  . dicyclomine (BENTYL) 20 MG tablet Take 20 mg by mouth every 6 (six) hours as  needed for spasms.   PRN  . hydrocortisone 2.5 % cream Apply 1 application topically daily as needed (eczema).    PRN  . loperamide (IMODIUM A-D) 2 MG tablet Take 4-6 mg by mouth daily as needed for diarrhea or loose stools.   PRN  . loratadine (CLARITIN) 10 MG tablet Take 10 mg by mouth daily as needed (for seasonal allergies).    PRN    Assessment: 76 y.o. female recently admitted with significant left leg DVT s/p US guided cannulation bilateral popliteal veins and bilateral lower extremity ascending venography. Pharmacy consulted to start lovenox 8 hours post sheath removal. HgB stable low 10.1, CrCl ~50 mL/min  Goal of Therapy:  Anti-Xa level 0.6-1 units/ml 4hrs after LMWH dose given Monitor platelets by anticoagulation  protocol: Yes   Plan:  Lovenox 70 mg BID Monitor renal function and for s/sx of bleeding  Ebrahim Deremer L Tyreesha Maharaj 05/06/2017,5:08 PM

## 2017-05-07 ENCOUNTER — Encounter (HOSPITAL_COMMUNITY): Payer: Self-pay | Admitting: Vascular Surgery

## 2017-05-07 ENCOUNTER — Observation Stay (HOSPITAL_COMMUNITY): Payer: Medicare HMO

## 2017-05-07 DIAGNOSIS — I82412 Acute embolism and thrombosis of left femoral vein: Secondary | ICD-10-CM | POA: Diagnosis not present

## 2017-05-07 LAB — BASIC METABOLIC PANEL
Anion gap: 3 — ABNORMAL LOW (ref 5–15)
BUN: 15 mg/dL (ref 6–20)
CALCIUM: 8.2 mg/dL — AB (ref 8.9–10.3)
CHLORIDE: 107 mmol/L (ref 101–111)
CO2: 27 mmol/L (ref 22–32)
CREATININE: 1 mg/dL (ref 0.44–1.00)
GFR calc non Af Amer: 54 mL/min — ABNORMAL LOW (ref >60)
Glucose, Bld: 120 mg/dL — ABNORMAL HIGH (ref 65–99)
Potassium: 4.2 mmol/L (ref 3.5–5.1)
Sodium: 137 mmol/L (ref 135–145)

## 2017-05-07 LAB — CBC
HCT: 29.4 % — ABNORMAL LOW (ref 36.0–46.0)
Hemoglobin: 9.1 g/dL — ABNORMAL LOW (ref 12.0–15.0)
MCH: 31.2 pg (ref 26.0–34.0)
MCHC: 31 g/dL (ref 30.0–36.0)
MCV: 100.7 fL — AB (ref 78.0–100.0)
PLATELETS: 330 10*3/uL (ref 150–400)
RBC: 2.92 MIL/uL — AB (ref 3.87–5.11)
RDW: 13.8 % (ref 11.5–15.5)
WBC: 6.2 10*3/uL (ref 4.0–10.5)

## 2017-05-07 MED ORDER — OXYCODONE-ACETAMINOPHEN 5-325 MG PO TABS: 1.0000 | ORAL_TABLET | ORAL | 0 refills | Status: DC | PRN

## 2017-05-07 MED ORDER — IOPAMIDOL (ISOVUE-300) INJECTION 61%
INTRAVENOUS | Status: AC
  Filled 2017-05-07: qty 100

## 2017-05-07 MED ORDER — IOPAMIDOL (ISOVUE-300) INJECTION 61%
INTRAVENOUS | Status: AC
  Administered 2017-05-07: 30 mL
  Filled 2017-05-07: qty 30

## 2017-05-07 MED ORDER — IOPAMIDOL (ISOVUE-300) INJECTION 61%
100.0000 mL | Freq: Once | INTRAVENOUS | Status: AC | PRN
Start: 2017-05-07 — End: 2017-05-07
  Administered 2017-05-07: 100 mL via INTRAVENOUS

## 2017-05-07 NOTE — Care Management Obs Status (Signed)
[facility] NOTIFICATION   Patient Details  Name: [name] May MRN: [id number] Date of Birth: 08-Jan-1941   Medicare Observation Status Notification Given:  Yes    Carles Collet, RN 05/07/2017, 4:21 PM

## 2017-05-07 NOTE — Progress Notes (Signed)
  [facility] Progress Note  Patient extremely upset that she has not had her CT venogram yet. Says she needs to get it done today because it will cost "$32,000" to stay past midnight. Talked to her RN who says that she will be undergoing her scan shortly after she receives a new IV site. She can be discharged following her scan. Drs Donzetta Matters and Trula Slade to review her images afterwards and formulate a plan.  Virgina Jock, PA-C [facility] Office: [phone] Pager: [phone] 05/07/2017 4:32 PM

## 2017-05-07 NOTE — Progress Notes (Signed)
Discharge instructions reviewed with the patient and her son to include medications, activity and follow up appointments.  Both voice understanding to teaching.  To door via wheelchair with all of her belongings.  Home via [place] with her son driving

## 2017-05-07 NOTE — Progress Notes (Addendum)
Spoke with patient about CT venogram.  I will review images with Dr. Donzetta Matters this week and we will get back to her with the plan. She can be discharged after her CT.  WElls BRabham

## 2017-05-07 NOTE — Discharge Instructions (Signed)
Drs Donzetta Matters and Trula Slade will review your CT images and call you to tell you about their plan.

## 2017-05-09 NOTE — Discharge Summary (Signed)
[facility] Discharge Summary  [name] May [birth year] 76 y.o. female  [id number]  Admission Date: 05/06/2017  Discharge Date: 05/07/2017  Physician: Servando Snare, MD  Admission Diagnosis: Deep Vein Thrombosis  I82.4Y2  HPI:   This is a 76 y.o. female recently admitted with significant left leg dvt. 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 Has not further gi bleeding. No on blood thinners until recent diagnosis and has since been on tx lovenox last dose yesterday. Continued edema and pain in left leg, right leg also edematous.   Hospital Course:  The patient was admitted to the hospital and taken to the operating room on 05/06/2017 and underwent:  1.  US guided cannulation bilateral popliteal veins 2.  Bilateral lower extremity ascending venography  Findings: The popliteal veins on the left were very hard cannulate and she has chronic thrombosis as well as what appears to be acute thrombosis in her femoral veins on the left. I could not get into the iliac veins on the left. At that time I cannulated the right popliteal vein performed a sending photography and she indeed does not have clot in her proximal ureter femoral veins on the right. 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 Next  Patient will need CT venogram and consideration of possible angiovac procedure.  The patient underwent CT venogram on 05/07/17 and was discharged home afterwards. Drs Donzetta Matters and Trula Slade to review images and contact patient back with plan.   CBC    Component Value Date/Time   WBC 6.2 05/07/2017 0251   RBC 2.92 (L) 05/07/2017 0251   HGB 9.1 (L) 05/07/2017 0251   HCT 29.4 (L) 05/07/2017 0251   PLT 330 05/07/2017 0251   MCV 100.7 (H) 05/07/2017 0251   MCH 31.2 05/07/2017 0251   MCHC 31.0 05/07/2017 0251   RDW 13.8 05/07/2017 0251    BMET    Component  Value Date/Time   NA 137 05/07/2017 0251   K 4.2 05/07/2017 0251   CL 107 05/07/2017 0251   CO2 27 05/07/2017 0251   GLUCOSE 120 (H) 05/07/2017 0251   BUN 15 05/07/2017 0251   CREATININE 1.00 05/07/2017 0251   CALCIUM 8.2 (L) 05/07/2017 0251   GFRNONAA 54 (L) 05/07/2017 0251   GFRAA >60 05/07/2017 0251     Discharge Instructions:   The patient is discharged to home with extensive instructions on wound care and progressive ambulation.  They are instructed not to drive or perform any heavy lifting until returning to see the physician in his office.  Discharge Instructions    Call MD for:  redness, tenderness, or signs of infection (pain, swelling, bleeding, redness, odor or green/yellow discharge around incision site)    Complete by:  As directed    Call MD for:  severe or increased pain, loss or decreased feeling  in affected limb(s)    Complete by:  As directed    Call MD for:  temperature >100.5    Complete by:  As directed    Discharge wound care:    Complete by:  As directed    If you have a dressing behind your knees, you may take these dressings off when you get home. Shower as normal. You do not have to reapply a dressing.   Driving Restrictions    Complete by:  As directed    No  driving while on pain medication   Increase activity slowly    Complete by:  As directed    Walk with assistance use walker or cane as needed   Resume previous diet    Complete by:  As directed       Discharge Diagnosis:  Deep Vein Thrombosis  I82.4Y2  Secondary Diagnosis: Patient Active Problem List   Diagnosis Date Noted  . Cellulitis of left lower extremity 04/30/2017  . Cellulitis 04/30/2017  . Edema of left lower extremity 04/23/2017  . AKI (acute kidney injury) ([place]) 04/23/2017  . ARF (acute renal failure) ([place]) 04/22/2017  . DVT of lower extremity, bilateral ([place]) 02/10/2015  . PE (pulmonary embolism)   . Shortness of breath   . Normocytic anemia 02/07/2015  . DVT (deep  venous thrombosis) ([place])   . Acute pulmonary embolism ([place]) 02/06/2015  . Scoliosis of thoracic spine 02/06/2015  . GERD (gastroesophageal reflux disease) 02/06/2015  . Acute respiratory failure with hypoxia ([place]) 02/06/2015  . Acute kidney injury ([place]) 02/06/2015  . Elevated troponin 02/06/2015   Past Medical History:  Diagnosis Date  . Allergy   . Anemia   . Arthritis    left knee  . Back pain   . Bipolar disorder ([place])   . Chronic headaches   . Degenerative disorder of bone   . Depression   . Esophageal ulcer    history  . GERD (gastroesophageal reflux disease)   . Hyperlipidemia   . Pulmonary embolism ([place])   . Scoliosis      Allergies as of 05/07/2017      Reactions   Latex Itching, Rash, Other (See Comments)   Blisters for months on legs   Mold Extract [trichophyton] Anaphylaxis, Swelling, Rash   Vancomycin Other (See Comments)   05/01/17: Pt noted to c/o redness and burning to R arm during IV Vancomycin 1g infusion. Infusion stopped and diphenhydramine 25mg  IV given.       Medication List    STOP taking these medications   doxycycline 100 MG tablet Commonly known as:  VIBRA-TABS     TAKE these medications   ascorbic acid 250 MG tablet Commonly known as:  VITAMIN C Take 250 mg by mouth 2 (two) times daily.   buPROPion 300 MG 24 hr tablet Commonly known as:  WELLBUTRIN XL Take 300 mg by mouth daily.   CALCIUM + D PO Take 1 tablet by mouth daily.   dicyclomine 20 MG tablet Commonly known as:  BENTYL Take 20 mg by mouth every 6 (six) hours as needed for spasms.   enoxaparin 80 MG/0.8ML injection Commonly known as:  LOVENOX Inject 0.7 mLs (70 mg total) into the skin every 12 (twelve) hours.   gabapentin 600 MG tablet Commonly known as:  NEURONTIN Take 600 mg by mouth 3 (three) times daily. AT 1500, 1800, and 2100   hydrocortisone 2.5 % cream Apply 1 application topically daily as needed (eczema).   lamoTRIgine 200 MG tablet Commonly known as:   LAMICTAL Take 200 mg by mouth at bedtime.   loperamide 2 MG tablet Commonly known as:  IMODIUM A-D Take 4-6 mg by mouth daily as needed for diarrhea or loose stools.   loratadine 10 MG tablet Commonly known as:  CLARITIN Take 10 mg by mouth daily as needed (for seasonal allergies).   LORazepam 0.5 MG tablet Commonly known as:  ATIVAN Take 0.5 mg by mouth 2 (two) times daily.   MAGNESIUM-OXIDE 400 (241.3 Mg) MG tablet Generic drug:  magnesium oxide Take 400 mg by mouth 2 (two) times daily.   methocarbamol 500 MG tablet Commonly known as:  ROBAXIN Take 1 tablet (500 mg total) by mouth 3 (three) times daily.   NU-IRON 150 MG capsule Generic drug:  iron polysaccharides Take 150 mg by mouth daily.   oxyCODONE-acetaminophen 5-325 MG tablet Commonly known as:  PERCOCET/ROXICET Take 1-2 tablets by mouth every 3 (three) hours as needed for moderate pain.   pantoprazole 40 MG tablet Commonly known as:  PROTONIX Take 40 mg by mouth 2 (two) times daily as needed (acid reflux).   rOPINIRole 2 MG tablet Commonly known as:  REQUIP Take 2 mg by mouth 3 (three) times daily. AT 1500, 1800, and 2100   rOPINIRole 1 MG tablet Commonly known as:  REQUIP Take 1 mg by mouth every morning.       Percocet #20 No Refill  Disposition: Home  Patient's condition: is Good  Follow up: 1. F/u with Dr. Donzetta Matters or Trula Slade in next 1-2 weeks   Virgina Jock, [place] [facility] [phone] 05/09/2017  3:21 PM

## 2017-05-10 ENCOUNTER — Ambulatory Visit (INDEPENDENT_AMBULATORY_CARE_PROVIDER_SITE_OTHER): Payer: Medicare HMO | Admitting: Vascular Surgery

## 2017-05-10 ENCOUNTER — Encounter: Payer: Self-pay | Admitting: Vascular Surgery

## 2017-05-10 ENCOUNTER — Other Ambulatory Visit: Payer: Self-pay

## 2017-05-10 ENCOUNTER — Telehealth: Payer: Self-pay | Admitting: Vascular Surgery

## 2017-05-10 VITALS — BP 123/84 | HR 75 | Temp 97.7°F | Resp 18 | Ht <= 58 in | Wt 163.0 lb

## 2017-05-10 DIAGNOSIS — I82423 Acute embolism and thrombosis of iliac vein, bilateral: Secondary | ICD-10-CM | POA: Diagnosis not present

## 2017-05-10 NOTE — Telephone Encounter (Signed)
Sched appt 05/10/17 at 12:15. Lm on hm#, pt returned call to confirm appt.

## 2017-05-10 NOTE — Telephone Encounter (Signed)
-----   Message from Mena Goes, RN sent at 05/09/2017  5:22 PM EDT ----- Regarding: I put a hold on [facility] schedule for this patient tomorrow, please add    ----- Message ----- From: Serafina Mitchell, MD Sent: 05/09/2017   5:10 PM To: Vvs Charge Pool  I spoke to patient and Dr. Donzetta Matters this afternoon.  Can we get her in to see Dr. Donzetta Matters tomorrow afternoon to discuss DVT.  She is going to call the office around 10 Friday to see what time to come in.  It looks like we can make a space for her in the afternoon.  Thanks

## 2017-05-10 NOTE — Progress Notes (Signed)
Patient ID: [name] May, female   DOB: January 13, 1941, 76 y.o.   MRN: [id number]  Reason for Consult: Re-evaluation (Discuss DVT)   Referred by Jefm Petty, MD  Subjective:     HPI:  [name] May is a 76 y.o. 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 She was then subsequently gone on blood thinners and was recently hospitalized with extensive bilateral lower extremity DVT. She underwent attempted intervention but could not get through from the popliteal on the left. She therefore underwent CT venogram now presents to discuss results today. She has persistent swelling of her bilateral lower extremities which is very disc, burning to her and she has heaviness of her bilateral legs. She cannot wear stockings given a latex allergy and her inability to put them on. She does not have ulceration of her bilateral legs nor does she have cellulitis at this point.  Past Medical History:  Diagnosis Date  . Allergy   . Anemia   . Arthritis    left knee  . Back pain   . Bipolar disorder ([place])   . Chronic headaches   . Degenerative disorder of bone   . Depression   . Esophageal ulcer    history  . GERD (gastroesophageal reflux disease)   . Hyperlipidemia   . Pulmonary embolism ([place])   . Scoliosis    Family History  Problem Relation Age of Onset  . Deep vein thrombosis Neg Hx    Past Surgical History:  Procedure Laterality Date  . ABDOMINAL HYSTERECTOMY    . ANKLE SURGERY    . COLONOSCOPY    . EYE SURGERY     caratacs  . hemroidectomy    . INTRAVASCULAR ULTRASOUND/IVUS N/A 05/06/2017   Procedure: INTRAVASCULAR ULTRASOUND/IVUS;  Surgeon: Waynetta Sandy, MD;  Location: [place];  Service: Vascular;  Laterality: N/A;  . LAMINECTOMY    . NASAL SINUS SURGERY      Short Social History:  Social History  Substance Use Topics  . Smoking status: Former Research scientist (life sciences)  . Smokeless tobacco: Never Used     Comment: quit 25 years ago   . Alcohol use No     Allergies  Allergen Reactions  . Latex Itching, Rash and Other (See Comments)    Blisters for months on legs  . Mold Extract [Trichophyton] Anaphylaxis, Swelling and Rash  . Vancomycin Other (See Comments)    05/01/17: Pt noted to c/o redness and burning to R arm during IV Vancomycin 1g infusion. Infusion stopped and diphenhydramine 25mg  IV given.     Current Outpatient Prescriptions  Medication Sig Dispense Refill  . ascorbic acid (VITAMIN C) 250 MG tablet Take 250 mg by mouth 2 (two) times daily.    Marland Kitchen buPROPion (WELLBUTRIN XL) 300 MG 24 hr tablet Take 300 mg by mouth daily.     . Calcium Carbonate-Vitamin D (CALCIUM + D PO) Take 1 tablet by mouth daily.     Marland Kitchen dicyclomine (BENTYL) 20 MG tablet Take 20 mg by mouth every 6 (six) hours as needed for spasms.    Marland Kitchen enoxaparin (LOVENOX) 80 MG/0.8ML injection Inject 0.7 mLs (70 mg total) into the skin every 12 (twelve) hours. 60 Syringe 0  . gabapentin (NEURONTIN) 600 MG tablet Take 600 mg by mouth 3 (three) times daily. AT 1500, 1800, and 2100    . hydrocortisone 2.5 % cream Apply 1 application topically daily as needed (eczema).     . iron polysaccharides (NU-IRON) 150  MG capsule Take 150 mg by mouth daily.    Marland Kitchen lamoTRIgine (LAMICTAL) 200 MG tablet Take 200 mg by mouth at bedtime.     Marland Kitchen loperamide (IMODIUM A-D) 2 MG tablet Take 4-6 mg by mouth daily as needed for diarrhea or loose stools.    Marland Kitchen loratadine (CLARITIN) 10 MG tablet Take 10 mg by mouth daily as needed (for seasonal allergies).     . LORazepam (ATIVAN) 0.5 MG tablet Take 0.5 mg by mouth 2 (two) times daily.    Marland Kitchen MAGNESIUM-OXIDE 400 (241.3 Mg) MG tablet Take 400 mg by mouth 2 (two) times daily.  0  . methocarbamol (ROBAXIN) 500 MG tablet Take 1 tablet (500 mg total) by mouth 3 (three) times daily. 30 tablet 0  . oxyCODONE-acetaminophen (PERCOCET/ROXICET) 5-325 MG tablet Take 1-2 tablets by mouth every 3 (three) hours as needed for moderate pain. 20 tablet 0  . pantoprazole  (PROTONIX) 40 MG tablet Take 40 mg by mouth 2 (two) times daily as needed (acid reflux).     Marland Kitchen rOPINIRole (REQUIP) 1 MG tablet Take 1 mg by mouth every morning.   1  . rOPINIRole (REQUIP) 2 MG tablet Take 2 mg by mouth 3 (three) times daily. AT 1500, 1800, and 2100     No current facility-administered medications for this visit.     Review of Systems  Constitutional:  Constitutional negative. Eyes: Eyes negative.  Respiratory: Positive for shortness of breath.  Cardiovascular: Positive for leg swelling.  GI: Gastrointestinal negative.  Musculoskeletal: Positive for leg pain and joint pain.  Skin: Positive for rash.  Neurological:       tremor Psychiatric: Positive for depressed mood.        Objective:  Objective   Vitals:   05/10/17 1240  BP: 123/84  Pulse: 75  Resp: 18  Temp: 97.7 F (36.5 C)  TempSrc: Oral  SpO2: 94%  Weight: 163 lb (73.9 kg)  Height: 4\' 9"  (1.448 m)   Body mass index is 35.27 kg/m.  Physical Exam  Constitutional: She is oriented to person, place, and time. She appears well-developed.  HENT:  Head: Normocephalic.  Eyes: Pupils are equal, round, and reactive to light.  Cardiovascular: Normal rate.   Pulses:      Dorsalis pedis pulses are 2+ on the right side, and 2+ on the left side.       Posterior tibial pulses are 2+ on the right side, and 2+ on the left side.  Abdominal: Soft. She exhibits no mass.  Musculoskeletal: She exhibits edema.  Neurological: She is alert and oriented to person, place, and time.  Skin: Skin is warm and dry.  Psychiatric: She has a normal mood and affect. Her behavior is normal. Judgment and thought content normal.    Data: IMPRESSION: VASCULAR  There is extensive DVT throughout the iliac venous system and IVC to the level of the IVC filter. Above the filter, the IVC is patent. DVT extends into the left common femoral and femoral veins. Renal veins are also patent. See above comments for details.       Assessment/Plan:     76 year old female presents for discussion of options regarding her occluded IVC filter as well as significant left lower extremity DVT. We will review the CT together and discussed her options which are essentially this point do nothing versus endovascular intervention. At this point she agrees to proceed with placement of lysis catheters with follow-up possible Angiomax and removal of filter. We discussed all the  risk and benefits including pulmonary embolism which could lead to death, injury to the IVC or iliofemoral veins, bleeding. She demonstrates good understanding and at this time given her low quality of life with significant lower extremity swelling she wished to proceed. She will take her last dose of Lovenox Monday night and we will plan for intervention beginning Tuesday.     Waynetta Sandy MD [facility]

## 2017-05-14 ENCOUNTER — Encounter (HOSPITAL_COMMUNITY): Payer: Self-pay | Admitting: *Deleted

## 2017-05-14 ENCOUNTER — Encounter (HOSPITAL_COMMUNITY)
Admission: AD | Disposition: A | Discharge status: Home / Self Care | Payer: Self-pay | Source: Ambulatory Visit | Attending: Surgery

## 2017-05-14 ENCOUNTER — Inpatient Hospital Stay (HOSPITAL_COMMUNITY)
Admission: AD | Admit: 2017-05-14 | Discharge: 2017-05-19 | DRG: 270 | Disposition: A | Discharge status: Home / Self Care | Payer: Medicare HMO | Source: Ambulatory Visit | Attending: Surgery | Admitting: Surgery

## 2017-05-14 DIAGNOSIS — T82868A Thrombosis of vascular prosthetic devices, implants and grafts, initial encounter: Secondary | ICD-10-CM | POA: Diagnosis present

## 2017-05-14 DIAGNOSIS — I82220 Acute embolism and thrombosis of inferior vena cava: Secondary | ICD-10-CM | POA: Diagnosis present

## 2017-05-14 DIAGNOSIS — Z87891 Personal history of nicotine dependence: Secondary | ICD-10-CM | POA: Diagnosis not present

## 2017-05-14 DIAGNOSIS — I82423 Acute embolism and thrombosis of iliac vein, bilateral: Secondary | ICD-10-CM | POA: Diagnosis present

## 2017-05-14 DIAGNOSIS — I82409 Acute embolism and thrombosis of unspecified deep veins of unspecified lower extremity: Secondary | ICD-10-CM | POA: Diagnosis present

## 2017-05-14 DIAGNOSIS — I714 Abdominal aortic aneurysm, without rupture: Secondary | ICD-10-CM | POA: Diagnosis not present

## 2017-05-14 DIAGNOSIS — M419 Scoliosis, unspecified: Secondary | ICD-10-CM | POA: Diagnosis present

## 2017-05-14 DIAGNOSIS — Z95828 Presence of other vascular implants and grafts: Secondary | ICD-10-CM

## 2017-05-14 DIAGNOSIS — Z6835 Body mass index (BMI) 35.0-35.9, adult: Secondary | ICD-10-CM

## 2017-05-14 DIAGNOSIS — Z79899 Other long term (current) drug therapy: Secondary | ICD-10-CM | POA: Diagnosis not present

## 2017-05-14 DIAGNOSIS — Z90710 Acquired absence of both cervix and uterus: Secondary | ICD-10-CM

## 2017-05-14 DIAGNOSIS — I82402 Acute embolism and thrombosis of unspecified deep veins of left lower extremity: Secondary | ICD-10-CM

## 2017-05-14 DIAGNOSIS — Z91040 Latex allergy status: Secondary | ICD-10-CM | POA: Diagnosis not present

## 2017-05-14 DIAGNOSIS — K219 Gastro-esophageal reflux disease without esophagitis: Secondary | ICD-10-CM | POA: Diagnosis present

## 2017-05-14 HISTORY — PX: LOWER EXTREMITY VENOGRAPHY: CATH118253

## 2017-05-14 LAB — BASIC METABOLIC PANEL
Anion gap: 8 (ref 5–15)
BUN: 10 mg/dL (ref 6–20)
CHLORIDE: 107 mmol/L (ref 101–111)
CO2: 25 mmol/L (ref 22–32)
CREATININE: 0.86 mg/dL (ref 0.44–1.00)
Calcium: 8.8 mg/dL — ABNORMAL LOW (ref 8.9–10.3)
GFR calc Af Amer: >60 mL/min (ref >60)
GFR calc non Af Amer: >60 mL/min (ref >60)
Glucose, Bld: 72 mg/dL (ref 65–99)
Potassium: 4 mmol/L (ref 3.5–5.1)
Sodium: 140 mmol/L (ref 135–145)

## 2017-05-14 LAB — CBC
HCT: 33.1 % — ABNORMAL LOW (ref 36.0–46.0)
Hemoglobin: 10.1 g/dL — ABNORMAL LOW (ref 12.0–15.0)
MCH: 30.5 pg (ref 26.0–34.0)
MCHC: 30.5 g/dL (ref 30.0–36.0)
MCV: 100 fL (ref 78.0–100.0)
PLATELETS: 314 10*3/uL (ref 150–400)
RBC: 3.31 MIL/uL — AB (ref 3.87–5.11)
RDW: 13.9 % (ref 11.5–15.5)
WBC: 4.7 10*3/uL (ref 4.0–10.5)

## 2017-05-14 LAB — APTT: aPTT: 29 seconds (ref 24–36)

## 2017-05-14 LAB — FIBRINOGEN: FIBRINOGEN: 524 mg/dL — AB (ref 210–475)

## 2017-05-14 LAB — HEPARIN LEVEL (UNFRACTIONATED): HEPARIN UNFRACTIONATED: 0.63 [IU]/mL (ref 0.30–0.70)

## 2017-05-14 SURGERY — LOWER EXTREMITY VENOGRAPHY
Anesthesia: LOCAL

## 2017-05-14 MED ORDER — PANTOPRAZOLE SODIUM 40 MG PO TBEC: 40.0000 mg | DELAYED_RELEASE_TABLET | Freq: Two times a day (BID) | ORAL | Status: DC | PRN

## 2017-05-14 MED ORDER — LAMOTRIGINE 25 MG PO TABS
200.0000 mg | ORAL_TABLET | Freq: Every day | ORAL | Status: DC
  Administered 2017-05-14 – 2017-05-18 (×5): 200 mg via ORAL
  Filled 2017-05-14 (×3): qty 1
  Filled 2017-05-14 (×2): qty 8

## 2017-05-14 MED ORDER — SODIUM CHLORIDE 0.9 % IV SOLN
INTRAVENOUS | Status: DC
  Administered 2017-05-15: via INTRAVENOUS

## 2017-05-14 MED ORDER — SODIUM CHLORIDE 0.9% FLUSH
3.0000 mL | Freq: Two times a day (BID) | INTRAVENOUS | Status: DC
  Administered 2017-05-14 – 2017-05-16 (×3): 3 mL via INTRAVENOUS

## 2017-05-14 MED ORDER — BUPROPION HCL ER (XL) 300 MG PO TB24
300.0000 mg | ORAL_TABLET | Freq: Every day | ORAL | Status: DC
  Administered 2017-05-14 – 2017-05-19 (×5): 300 mg via ORAL
  Filled 2017-05-14 (×5): qty 1
  Filled 2017-05-14: qty 2

## 2017-05-14 MED ORDER — OXYCODONE-ACETAMINOPHEN 5-325 MG PO TABS
ORAL_TABLET | ORAL | Status: AC
  Filled 2017-05-14: qty 1

## 2017-05-14 MED ORDER — MAGNESIUM OXIDE 400 (241.3 MG) MG PO TABS
400.0000 mg | ORAL_TABLET | Freq: Two times a day (BID) | ORAL | Status: DC
  Administered 2017-05-14 – 2017-05-19 (×9): 400 mg via ORAL
  Filled 2017-05-14 (×10): qty 1

## 2017-05-14 MED ORDER — VITAMIN C 500 MG PO TABS
250.0000 mg | ORAL_TABLET | Freq: Two times a day (BID) | ORAL | Status: DC
  Administered 2017-05-14 – 2017-05-15 (×3): 250 mg via ORAL
  Filled 2017-05-14 (×5): qty 1

## 2017-05-14 MED ORDER — HEPARIN SODIUM (PORCINE) 1000 UNIT/ML IJ SOLN
INTRAMUSCULAR | Status: DC | PRN
  Administered 2017-05-14: 5000 [IU] via INTRAVENOUS

## 2017-05-14 MED ORDER — CALCIUM CITRATE-VITAMIN D 315-200 MG-UNIT PO TABS: 1.0000 | ORAL_TABLET | Freq: Every day | ORAL | Status: DC

## 2017-05-14 MED ORDER — LIDOCAINE HCL (PF) 1 % IJ SOLN
INTRAMUSCULAR | Status: AC
  Filled 2017-05-14: qty 30

## 2017-05-14 MED ORDER — MORPHINE SULFATE (PF) 4 MG/ML IV SOLN
5.0000 mg | INTRAVENOUS | Status: DC | PRN
  Administered 2017-05-16: 5 mg via INTRAVENOUS
  Administered 2017-05-16: 4 mg via INTRAVENOUS
  Administered 2017-05-17: 5 mg via INTRAVENOUS
  Filled 2017-05-14 (×3): qty 2

## 2017-05-14 MED ORDER — OXYCODONE-ACETAMINOPHEN 5-325 MG PO TABS
1.0000 | ORAL_TABLET | ORAL | Status: AC
  Administered 2017-05-14 (×2): 1 via ORAL

## 2017-05-14 MED ORDER — MIDAZOLAM HCL 2 MG/2ML IJ SOLN
INTRAMUSCULAR | Status: AC
  Filled 2017-05-14: qty 2

## 2017-05-14 MED ORDER — IODIXANOL 320 MG/ML IV SOLN
INTRAVENOUS | Status: DC | PRN
  Administered 2017-05-14: 30 mL via INTRAVENOUS

## 2017-05-14 MED ORDER — SODIUM CHLORIDE 0.9 % IV SOLN
INTRAVENOUS | Status: DC
  Administered 2017-05-14: 11:00:00 via INTRAVENOUS

## 2017-05-14 MED ORDER — MORPHINE SULFATE (PF) 10 MG/ML IV SOLN: 5.0000 mg | INTRAVENOUS | Status: DC | PRN

## 2017-05-14 MED ORDER — FENTANYL CITRATE (PF) 100 MCG/2ML IJ SOLN
INTRAMUSCULAR | Status: AC
  Filled 2017-05-14: qty 2

## 2017-05-14 MED ORDER — ROPINIROLE HCL 1 MG PO TABS
1.0000 mg | ORAL_TABLET | ORAL | Status: DC
  Administered 2017-05-15 – 2017-05-19 (×5): 1 mg via ORAL
  Filled 2017-05-14 (×5): qty 1

## 2017-05-14 MED ORDER — OXYCODONE-ACETAMINOPHEN 5-325 MG PO TABS
ORAL_TABLET | ORAL | Status: AC
  Administered 2017-05-14: 1 via ORAL
  Filled 2017-05-14: qty 1

## 2017-05-14 MED ORDER — LORAZEPAM 0.5 MG PO TABS
0.5000 mg | ORAL_TABLET | Freq: Two times a day (BID) | ORAL | Status: DC
  Administered 2017-05-14 – 2017-05-19 (×9): 0.5 mg via ORAL
  Filled 2017-05-14 (×9): qty 1

## 2017-05-14 MED ORDER — LOPERAMIDE HCL 2 MG PO TABS: 4.0000 mg | ORAL_TABLET | Freq: Every day | ORAL | Status: DC | PRN

## 2017-05-14 MED ORDER — SODIUM CHLORIDE 0.9% FLUSH: 3.0000 mL | INTRAVENOUS | Status: DC | PRN

## 2017-05-14 MED ORDER — POLYSACCHARIDE IRON COMPLEX 150 MG PO CAPS
150.0000 mg | ORAL_CAPSULE | Freq: Every day | ORAL | Status: DC
  Administered 2017-05-14 – 2017-05-19 (×5): 150 mg via ORAL
  Filled 2017-05-14 (×6): qty 1

## 2017-05-14 MED ORDER — HEPARIN SODIUM (PORCINE) 1000 UNIT/ML IJ SOLN
INTRAMUSCULAR | Status: AC
  Filled 2017-05-14: qty 1

## 2017-05-14 MED ORDER — METHOCARBAMOL 500 MG PO TABS
500.0000 mg | ORAL_TABLET | Freq: Three times a day (TID) | ORAL | Status: DC
  Administered 2017-05-14 – 2017-05-19 (×13): 500 mg via ORAL
  Filled 2017-05-14 (×13): qty 1

## 2017-05-14 MED ORDER — LOPERAMIDE HCL 2 MG PO CAPS: 4.0000 mg | ORAL_CAPSULE | Freq: Every day | ORAL | Status: DC | PRN

## 2017-05-14 MED ORDER — OXYCODONE-ACETAMINOPHEN 5-325 MG PO TABS
1.0000 | ORAL_TABLET | ORAL | Status: DC | PRN
  Administered 2017-05-14 – 2017-05-19 (×11): 2 via ORAL
  Filled 2017-05-14 (×11): qty 2

## 2017-05-14 MED ORDER — SODIUM CHLORIDE 0.9 % IV SOLN: 250.0000 mL | INTRAVENOUS | Status: DC | PRN

## 2017-05-14 MED ORDER — ONDANSETRON HCL 4 MG/2ML IJ SOLN: 4.0000 mg | Freq: Four times a day (QID) | INTRAMUSCULAR | Status: DC | PRN

## 2017-05-14 MED ORDER — LIDOCAINE HCL (PF) 1 % IJ SOLN
INTRAMUSCULAR | Status: DC | PRN
Start: 2017-05-14 — End: 2017-05-14
  Administered 2017-05-14: 30 mL via INTRADERMAL

## 2017-05-14 MED ORDER — FENTANYL CITRATE (PF) 100 MCG/2ML IJ SOLN
INTRAMUSCULAR | Status: DC | PRN
  Administered 2017-05-14: 50 ug via INTRAVENOUS

## 2017-05-14 MED ORDER — ROPINIROLE HCL 1 MG PO TABS
2.0000 mg | ORAL_TABLET | Freq: Three times a day (TID) | ORAL | Status: DC
  Administered 2017-05-14 – 2017-05-19 (×12): 2 mg via ORAL
  Filled 2017-05-14 (×12): qty 2

## 2017-05-14 MED ORDER — HEPARIN (PORCINE) IN NACL 100-0.45 UNIT/ML-% IJ SOLN
800.0000 [IU]/h | INTRAMUSCULAR | Status: DC
  Administered 2017-05-14: 800 [IU]/h via INTRAVENOUS
  Filled 2017-05-14 (×2): qty 250

## 2017-05-14 MED ORDER — MIDAZOLAM HCL 2 MG/2ML IJ SOLN: 1.0000 mg | INTRAMUSCULAR | Status: DC | PRN

## 2017-05-14 MED ORDER — SODIUM CHLORIDE 0.9 % IV SOLN
0.2500 mg/h | INTRAVENOUS | Status: DC
  Administered 2017-05-14 – 2017-05-16 (×2): 0.25 mg/h
  Filled 2017-05-14 (×3): qty 10

## 2017-05-14 MED ORDER — MIDAZOLAM HCL 2 MG/2ML IJ SOLN
INTRAMUSCULAR | Status: DC | PRN
  Administered 2017-05-14: 2 mg via INTRAVENOUS

## 2017-05-14 MED ORDER — GABAPENTIN 600 MG PO TABS
600.0000 mg | ORAL_TABLET | Freq: Three times a day (TID) | ORAL | Status: DC
  Administered 2017-05-14 – 2017-05-19 (×13): 600 mg via ORAL
  Filled 2017-05-14 (×13): qty 1

## 2017-05-14 MED ORDER — CALCIUM CARBONATE-VITAMIN D 500-200 MG-UNIT PO TABS
1.0000 | ORAL_TABLET | Freq: Every day | ORAL | Status: DC
  Administered 2017-05-15 – 2017-05-19 (×4): 1 via ORAL
  Filled 2017-05-14 (×4): qty 1

## 2017-05-14 MED ORDER — HEPARIN (PORCINE) IN NACL 2-0.9 UNIT/ML-% IJ SOLN
INTRAMUSCULAR | Status: AC
  Filled 2017-05-14: qty 500

## 2017-05-14 MED ORDER — LORATADINE 10 MG PO TABS: 10.0000 mg | ORAL_TABLET | Freq: Every day | ORAL | Status: DC | PRN

## 2017-05-14 MED ORDER — HEPARIN (PORCINE) IN NACL 2-0.9 UNIT/ML-% IJ SOLN
INTRAMUSCULAR | Status: AC | PRN
  Administered 2017-05-14: 500 mL

## 2017-05-14 MED ORDER — DICYCLOMINE HCL 20 MG PO TABS
20.0000 mg | ORAL_TABLET | Freq: Four times a day (QID) | ORAL | Status: DC | PRN
  Filled 2017-05-14: qty 1

## 2017-05-14 SURGICAL SUPPLY — 16 items
CATH ANGIO 5F BER2 65CM (CATHETERS) ×1 IMPLANT
CATH INFUS 135CMX50CM (CATHETERS) ×1 IMPLANT
CATH QUICKCROSS SUPP .035X90CM (MICROCATHETER) ×1 IMPLANT
COVER PRB 48X5XTLSCP FOLD TPE (BAG) IMPLANT
COVER PROBE 5X48 (BAG) ×2
DEVICE TORQUE H2O (MISCELLANEOUS) ×1 IMPLANT
GUIDEWIRE ANGLED .035X150CM (WIRE) ×1 IMPLANT
KIT MICROINTRODUCER STIFF 5F (SHEATH) ×2 IMPLANT
KIT PV (KITS) ×1 IMPLANT
PINNACLE LONG 7F 25CM (SHEATH) ×2
SHEATH INTRO PINNACLE 7F 25CM (SHEATH) IMPLANT
SHEATH PINNACLE 5F 10CM (SHEATH) ×2 IMPLANT
SHEATH PINNACLE 7F 10CM (SHEATH) ×1 IMPLANT
TRAY PV CATH (CUSTOM PROCEDURE TRAY) ×2 IMPLANT
WIRE BENTSON .035X145CM (WIRE) ×2 IMPLANT
WIRE ROSEN-J .035X180CM (WIRE) ×1 IMPLANT

## 2017-05-14 NOTE — Progress Notes (Signed)
ANTICOAGULATION CONSULT NOTE - [place] for Heparin Indication: extensive DVT (currently on lytic therapy as well)  Allergies  Allergen Reactions  . Latex Itching, Rash and Other (See Comments)    Blisters for months on legs  . Mold Extract [Trichophyton] Anaphylaxis, Swelling and Rash  . Vancomycin Other (See Comments)    05/01/17: Pt noted to c/o redness and burning to R arm during IV Vancomycin 1g infusion. Infusion stopped and diphenhydramine 25mg  IV given.     Patient Measurements: Height: 4\' 9"  (144.8 cm) Weight: 158 lb (71.7 kg) IBW/kg (Calculated) : 38.6 Heparin Dosing Weight: 55 kg  Vital Signs: Temp: 97.9 F (36.6 C) (07/03 1932) Temp Source: Axillary (07/03 1932) BP: 144/79 (07/03 1830) Pulse Rate: 77 (07/03 1830)  Labs:  Recent Labs  05/14/17 1107 05/14/17 1821  HGB  --  10.1*  HCT  --  33.1*  PLT  --  314  APTT 29  --   HEPARINUNFRC  --  0.63    Estimated Creatinine Clearance: 40.4 mL/min (by C-G formula based on SCr of 1 mg/dL).   Medical History: Past Medical History:  Diagnosis Date  . Allergy   . Anemia   . Arthritis    left knee  . Back pain   . Bipolar disorder ([place])   . Chronic headaches   . Degenerative disorder of bone   . Depression   . Esophageal ulcer    history  . GERD (gastroesophageal reflux disease)   . Hyperlipidemia   . Peripheral vascular disease ([place])   . Pulmonary embolism ([place])   . Scoliosis     Medications:  Scheduled:  . buPROPion  300 mg Oral Daily  . [START ON 05/15/2017] calcium-vitamin D  1 tablet Oral Q breakfast  . gabapentin  600 mg Oral TID  . iron polysaccharides  150 mg Oral Daily  . lamoTRIgine  200 mg Oral QHS  . LORazepam  0.5 mg Oral BID  . magnesium oxide  400 mg Oral BID  . methocarbamol  500 mg Oral TID  . oxyCODONE-acetaminophen      . [START ON 05/15/2017] rOPINIRole  1 mg Oral BH-q7a  . rOPINIRole  2 mg Oral TID  . sodium chloride flush  3 mL Intravenous Q12H  .  ascorbic acid  250 mg Oral BID   Infusions:  . sodium chloride    . sodium chloride 69.5 mL/hr at 05/14/17 1634  . alteplase (LIMB ISCHEMIA) 10 mg in normal saline (0.02 mg/mL) infusion 0.25 mg/hr (05/14/17 1623)  . heparin 800 Units/hr (05/14/17 1623)    Assessment: 76 yo F with occluded IVC filter and significant DVT in the pelvis.  Seen by vasc outpt and admitted to initiate lytic therapy.  Was on Lovenox PTA (last dose 7/2 PM).  Catheter directed alteplase initiated 7/3 at 1546. Peripheral heparin initiated at 800 units/hr (MD dose) 7/3 at 1550. Heparin level obtained at 1820 high at 0.63.  This is early in therapy and not steady state but will reduce rate to decrease risk of bleed with lytic therapy.  Goal of Therapy:  Heparin level 0.2-0.5 units/ml (reduced while on lytic therapy) Monitor platelets by anticoagulation protocol: Yes   Plan:  Reduce heparin to 650 units/hr. Adjust next heparin level time to be 6 hours after rate change.   [employer], Pharm.D., BCPS Clinical Pharmacist Pager: [phone] 05/14/2017 7:32 PM

## 2017-05-14 NOTE — H&P (View-Only) (Signed)
Patient ID: [name] May, female   DOB: October 08, 1941, 76 y.o.   MRN: [id number]  Reason for Consult: Re-evaluation (Discuss DVT)   Referred by Jefm Petty, MD  Subjective:     HPI:  [name] May is a 76 y.o. 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 She was then subsequently gone on blood thinners and was recently hospitalized with extensive bilateral lower extremity DVT. She underwent attempted intervention but could not get through from the popliteal on the left. She therefore underwent CT venogram now presents to discuss results today. She has persistent swelling of her bilateral lower extremities which is very disc, burning to her and she has heaviness of her bilateral legs. She cannot wear stockings given a latex allergy and her inability to put them on. She does not have ulceration of her bilateral legs nor does she have cellulitis at this point.  Past Medical History:  Diagnosis Date  . Allergy   . Anemia   . Arthritis    left knee  . Back pain   . Bipolar disorder ([place])   . Chronic headaches   . Degenerative disorder of bone   . Depression   . Esophageal ulcer    history  . GERD (gastroesophageal reflux disease)   . Hyperlipidemia   . Pulmonary embolism ([place])   . Scoliosis    Family History  Problem Relation Age of Onset  . Deep vein thrombosis Neg Hx    Past Surgical History:  Procedure Laterality Date  . ABDOMINAL HYSTERECTOMY    . ANKLE SURGERY    . COLONOSCOPY    . EYE SURGERY     caratacs  . hemroidectomy    . INTRAVASCULAR ULTRASOUND/IVUS N/A 05/06/2017   Procedure: INTRAVASCULAR ULTRASOUND/IVUS;  Surgeon: Waynetta Sandy, MD;  Location: [place];  Service: Vascular;  Laterality: N/A;  . LAMINECTOMY    . NASAL SINUS SURGERY      Short Social History:  Social History  Substance Use Topics  . Smoking status: Former Research scientist (life sciences)  . Smokeless tobacco: Never Used     Comment: quit 25 years ago   . Alcohol use No     Allergies  Allergen Reactions  . Latex Itching, Rash and Other (See Comments)    Blisters for months on legs  . Mold Extract [Trichophyton] Anaphylaxis, Swelling and Rash  . Vancomycin Other (See Comments)    05/01/17: Pt noted to c/o redness and burning to R arm during IV Vancomycin 1g infusion. Infusion stopped and diphenhydramine 25mg  IV given.     Current Outpatient Prescriptions  Medication Sig Dispense Refill  . ascorbic acid (VITAMIN C) 250 MG tablet Take 250 mg by mouth 2 (two) times daily.    Marland Kitchen buPROPion (WELLBUTRIN XL) 300 MG 24 hr tablet Take 300 mg by mouth daily.     . Calcium Carbonate-Vitamin D (CALCIUM + D PO) Take 1 tablet by mouth daily.     Marland Kitchen dicyclomine (BENTYL) 20 MG tablet Take 20 mg by mouth every 6 (six) hours as needed for spasms.    Marland Kitchen enoxaparin (LOVENOX) 80 MG/0.8ML injection Inject 0.7 mLs (70 mg total) into the skin every 12 (twelve) hours. 60 Syringe 0  . gabapentin (NEURONTIN) 600 MG tablet Take 600 mg by mouth 3 (three) times daily. AT 1500, 1800, and 2100    . hydrocortisone 2.5 % cream Apply 1 application topically daily as needed (eczema).     . iron polysaccharides (NU-IRON) 150  MG capsule Take 150 mg by mouth daily.    Marland Kitchen lamoTRIgine (LAMICTAL) 200 MG tablet Take 200 mg by mouth at bedtime.     Marland Kitchen loperamide (IMODIUM A-D) 2 MG tablet Take 4-6 mg by mouth daily as needed for diarrhea or loose stools.    Marland Kitchen loratadine (CLARITIN) 10 MG tablet Take 10 mg by mouth daily as needed (for seasonal allergies).     . LORazepam (ATIVAN) 0.5 MG tablet Take 0.5 mg by mouth 2 (two) times daily.    Marland Kitchen MAGNESIUM-OXIDE 400 (241.3 Mg) MG tablet Take 400 mg by mouth 2 (two) times daily.  0  . methocarbamol (ROBAXIN) 500 MG tablet Take 1 tablet (500 mg total) by mouth 3 (three) times daily. 30 tablet 0  . oxyCODONE-acetaminophen (PERCOCET/ROXICET) 5-325 MG tablet Take 1-2 tablets by mouth every 3 (three) hours as needed for moderate pain. 20 tablet 0  . pantoprazole  (PROTONIX) 40 MG tablet Take 40 mg by mouth 2 (two) times daily as needed (acid reflux).     Marland Kitchen rOPINIRole (REQUIP) 1 MG tablet Take 1 mg by mouth every morning.   1  . rOPINIRole (REQUIP) 2 MG tablet Take 2 mg by mouth 3 (three) times daily. AT 1500, 1800, and 2100     No current facility-administered medications for this visit.     Review of Systems  Constitutional:  Constitutional negative. Eyes: Eyes negative.  Respiratory: Positive for shortness of breath.  Cardiovascular: Positive for leg swelling.  GI: Gastrointestinal negative.  Musculoskeletal: Positive for leg pain and joint pain.  Skin: Positive for rash.  Neurological:       tremor Psychiatric: Positive for depressed mood.        Objective:  Objective   Vitals:   05/10/17 1240  BP: 123/84  Pulse: 75  Resp: 18  Temp: 97.7 F (36.5 C)  TempSrc: Oral  SpO2: 94%  Weight: 163 lb (73.9 kg)  Height: 4\' 9"  (1.448 m)   Body mass index is 35.27 kg/m.  Physical Exam  Constitutional: She is oriented to person, place, and time. She appears well-developed.  HENT:  Head: Normocephalic.  Eyes: Pupils are equal, round, and reactive to light.  Cardiovascular: Normal rate.   Pulses:      Dorsalis pedis pulses are 2+ on the right side, and 2+ on the left side.       Posterior tibial pulses are 2+ on the right side, and 2+ on the left side.  Abdominal: Soft. She exhibits no mass.  Musculoskeletal: She exhibits edema.  Neurological: She is alert and oriented to person, place, and time.  Skin: Skin is warm and dry.  Psychiatric: She has a normal mood and affect. Her behavior is normal. Judgment and thought content normal.    Data: IMPRESSION: VASCULAR  There is extensive DVT throughout the iliac venous system and IVC to the level of the IVC filter. Above the filter, the IVC is patent. DVT extends into the left common femoral and femoral veins. Renal veins are also patent. See above comments for details.       Assessment/Plan:     76 year old female presents for discussion of options regarding her occluded IVC filter as well as significant left lower extremity DVT. We will review the CT together and discussed her options which are essentially this point do nothing versus endovascular intervention. At this point she agrees to proceed with placement of lysis catheters with follow-up possible Angiomax and removal of filter. We discussed all the  risk and benefits including pulmonary embolism which could lead to death, injury to the IVC or iliofemoral veins, bleeding. She demonstrates good understanding and at this time given her low quality of life with significant lower extremity swelling she wished to proceed. She will take her last dose of Lovenox Monday night and we will plan for intervention beginning Tuesday.     Waynetta Sandy MD [facility]

## 2017-05-14 NOTE — Op Note (Signed)
    Patient name: [name] May MRN: [id number] DOB: Jul 04, 1941 Sex: female  05/14/2017 Pre-operative Diagnosis: DVT Post-operative diagnosis:  Same Surgeon:  [name] Procedure Performed:  1.  Ultrasound-guided access, left femoral vein  2.  Ultrasound-guided access, right femoral vein  3.  Ileo-caval venogram  4.  Placement of thrombolytic lysis catheter  5.  Initiation of thrombolytic therapy  6.  Conscious sedation (109minutes)    Indications:  The patient has an occluded IVC filter and significant DVT in the pelvis.  She is here today for initiation of lytic therapy.  Procedure:  The patient was identified in the holding area and taken to room 8.  The patient was then placed supine on the table and prepped and draped in the usual sterile fashion.  A time out was called.  Conscious sedation was administered with the use of IV fentanyl and Versed a continuous position under spinal drain.  Heart rate, blood pressure, and oxygen saturation were continuously monitored.  Ultrasound was used to evaluate the right common femoral vein.  It was patent and compressible.  1% lidocaine was used for local anesthesia.  The right common femoral vein was then cannulated under ultrasound guidance with a micropuncture needle.  An 018 wire was advanced followed by a micropuncture sheath.  I then tried to insert a Benson wire and was unsuccessful.  I then placed a 5 French sheath and used a Berenstein 2 catheter to try to navigate into the inferior vena cava which was unsuccessful.  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  At this point, attention was turned to the left groin.  The left femoral vein was evaluated with ultrasound and it appeared to have acute thrombus within it.  One percent lidocaine was used for local anesthesia.  The common femoral vein was cannulated under ultrasound guidance the micropuncture needle.  An 018 wire  was advanced without resistance followed by micropuncture sheath.  I was unable to advance a Bentson wire initially and so I placed a 5 [place] sheath.  I then used a Berenstein 2 catheter followed by a quick cross catheter and a Glidewire to ultimately gain access into the vena cava above the filter.  A contrast injection was performed at this point confirming that I was intraluminal.  I then placed a 50 cm infusion length catheter inside of a 7 French sheath to begin the initiation of TPA.    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 [name], M.D. [facility] [place] Office: [phone] Pager:  [phone]

## 2017-05-14 NOTE — Progress Notes (Signed)
ANTICOAGULATION CONSULT NOTE - Initial Consult  Pharmacy Consult for Heparin Indication: extensive DVT (currently on lytic therapy as well)  Allergies  Allergen Reactions  . Latex Itching, Rash and Other (See Comments)    Blisters for months on legs  . Mold Extract [Trichophyton] Anaphylaxis, Swelling and Rash  . Vancomycin Other (See Comments)    05/01/17: Pt noted to c/o redness and burning to R arm during IV Vancomycin 1g infusion. Infusion stopped and diphenhydramine 25mg  IV given.     Patient Measurements: Height: 4\' 9"  (144.8 cm) Weight: 158 lb (71.7 kg) IBW/kg (Calculated) : 38.6 Heparin Dosing Weight: 55 kg  Vital Signs: Temp: 99.3 F (37.4 C) (07/03 1022) Temp Source: Oral (07/03 1022) BP: 135/70 (07/03 1625) Pulse Rate: 76 (07/03 1625)  Labs:  Recent Labs  05/14/17 1107  APTT 29    Estimated Creatinine Clearance: 40.4 mL/min (by C-G formula based on SCr of 1 mg/dL).   Medical History: Past Medical History:  Diagnosis Date  . Allergy   . Anemia   . Arthritis    left knee  . Back pain   . Bipolar disorder ([place])   . Chronic headaches   . Degenerative disorder of bone   . Depression   . Esophageal ulcer    history  . GERD (gastroesophageal reflux disease)   . Hyperlipidemia   . Peripheral vascular disease ([place])   . Pulmonary embolism ([place])   . Scoliosis     Medications:  Scheduled:  . buPROPion  300 mg Oral Daily  . calcium citrate-vitamin D  1 tablet Oral Daily  . gabapentin  600 mg Oral TID  . iron polysaccharides  150 mg Oral Daily  . lamoTRIgine  200 mg Oral QHS  . LORazepam  0.5 mg Oral BID  . magnesium oxide  400 mg Oral BID  . methocarbamol  500 mg Oral TID  . oxyCODONE-acetaminophen      . [START ON 05/15/2017] rOPINIRole  1 mg Oral BH-q7a  . rOPINIRole  2 mg Oral TID  . sodium chloride flush  3 mL Intravenous Q12H  . ascorbic acid  250 mg Oral BID   Infusions:  . sodium chloride 100 mL/hr at 05/14/17 1112  . sodium chloride     . sodium chloride 69.5 mL/hr at 05/14/17 1634  . alteplase (LIMB ISCHEMIA) 10 mg in normal saline (0.02 mg/mL) infusion 0.25 mg/hr (05/14/17 1623)  . heparin 800 Units/hr (05/14/17 1623)    Assessment: 76 yo F with occluded IVC filter and significant DVT in the pelvis.  Seen by vasc outpt and admitted to initiate lytic therapy.  Was on Lovenox PTA (last dose 7/2 PM).  Catheter directed alteplase initiated 7/3 at 1546. Peripheral heparin initiated at 800 units/hr (MD dose) 7/3 at 1550.  Goal of Therapy:  Heparin level 0.2-0.5 units/ml (reduced while on lytic therapy) Monitor platelets by anticoagulation protocol: Yes   Plan:  Continue heparin at 800 units/hr. Heparin level ordered q6h x 4 per order set.  Next level due at 2100 tonight.  [employer], Pharm.D., BCPS Clinical Pharmacist Pager: [phone] 05/14/2017 4:47 PM

## 2017-05-14 NOTE — Interval H&P Note (Signed)
History and Physical Interval Note:  05/14/2017 1:12 PM  [name] May  has presented today for surgery, with the diagnosis of pvd  The various methods of treatment have been discussed with the patient and family. After consideration of risks, benefits and other options for treatment, the patient has consented to  Procedure(s): Lower Extremity Venography (N/A) as a surgical intervention .  The patient's history has been reviewed, patient examined, no change in status, stable for surgery.  I have reviewed the patient's chart and labs.  Questions were answered to the patient's satisfaction.     [name]

## 2017-05-15 ENCOUNTER — Encounter (HOSPITAL_COMMUNITY): Payer: Self-pay | Admitting: *Deleted

## 2017-05-15 LAB — BASIC METABOLIC PANEL
Anion gap: 3 — ABNORMAL LOW (ref 5–15)
BUN: 13 mg/dL (ref 6–20)
CO2: 25 mmol/L (ref 22–32)
CREATININE: 0.86 mg/dL (ref 0.44–1.00)
Calcium: 8.1 mg/dL — ABNORMAL LOW (ref 8.9–10.3)
Chloride: 108 mmol/L (ref 101–111)
GFR calc Af Amer: >60 mL/min (ref >60)
GLUCOSE: 81 mg/dL (ref 65–99)
Potassium: 3.7 mmol/L (ref 3.5–5.1)
SODIUM: 136 mmol/L (ref 135–145)

## 2017-05-15 LAB — CBC
HCT: 29.1 % — ABNORMAL LOW (ref 36.0–46.0)
HEMATOCRIT: 30.8 % — AB (ref 36.0–46.0)
HEMATOCRIT: 31.4 % — AB (ref 36.0–46.0)
Hemoglobin: 8.9 g/dL — ABNORMAL LOW (ref 12.0–15.0)
Hemoglobin: 9.1 g/dL — ABNORMAL LOW (ref 12.0–15.0)
Hemoglobin: 9.5 g/dL — ABNORMAL LOW (ref 12.0–15.0)
MCH: 30.6 pg (ref 26.0–34.0)
MCH: 29.7 pg (ref 26.0–34.0)
MCH: 30.4 pg (ref 26.0–34.0)
MCHC: 30.6 g/dL (ref 30.0–36.0)
MCHC: 29.5 g/dL — AB (ref 30.0–36.0)
MCHC: 30.3 g/dL (ref 30.0–36.0)
MCV: 100 fL (ref 78.0–100.0)
MCV: 100.7 fL — AB (ref 78.0–100.0)
MCV: 100.6 fL — AB (ref 78.0–100.0)
PLATELETS: 264 10*3/uL (ref 150–400)
PLATELETS: 266 10*3/uL (ref 150–400)
PLATELETS: 257 10*3/uL (ref 150–400)
RBC: 2.91 MIL/uL — ABNORMAL LOW (ref 3.87–5.11)
RBC: 3.06 MIL/uL — ABNORMAL LOW (ref 3.87–5.11)
RBC: 3.12 MIL/uL — ABNORMAL LOW (ref 3.87–5.11)
RDW: 13.9 % (ref 11.5–15.5)
RDW: 13.9 % (ref 11.5–15.5)
RDW: 13.7 % (ref 11.5–15.5)
WBC: 4.8 10*3/uL (ref 4.0–10.5)
WBC: 3.9 10*3/uL — AB (ref 4.0–10.5)
WBC: 4.3 10*3/uL (ref 4.0–10.5)

## 2017-05-15 LAB — FIBRINOGEN
Fibrinogen: 411 mg/dL (ref 210–475)
Fibrinogen: 452 mg/dL (ref 210–475)
Fibrinogen: 394 mg/dL (ref 210–475)

## 2017-05-15 LAB — HEPARIN LEVEL (UNFRACTIONATED)
HEPARIN UNFRACTIONATED: 0.17 [IU]/mL — AB (ref 0.30–0.70)
Heparin Unfractionated: 0.18 IU/mL — ABNORMAL LOW (ref 0.30–0.70)
Heparin Unfractionated: <0.1 IU/mL — ABNORMAL LOW (ref 0.30–0.70)

## 2017-05-15 MED ORDER — HEPARIN (PORCINE) IN NACL 100-0.45 UNIT/ML-% IJ SOLN
1000.0000 [IU]/h | INTRAMUSCULAR | Status: DC
  Administered 2017-05-15: 1000 [IU]/h via INTRAVENOUS

## 2017-05-15 NOTE — Progress Notes (Signed)
ANTICOAGULATION CONSULT NOTE - Follow Up Consult  Pharmacy Consult for heparin Indication: DVT  Labs:  Recent Labs  05/14/17 1107 05/14/17 1821 05/15/17 0144  HGB  --  10.1* 8.9*  HCT  --  33.1* 29.1*  PLT  --  314 264  APTT 29  --   --   HEPARINUNFRC  --  0.63 0.17*  CREATININE  --  0.86 0.86    Assessment: 75yo female subtherapeutic on heparin after rate reduction while receiving lytic therapy.  Goal of Therapy:  Heparin level 0.2-0.5 units/ml   Plan:  Will increase heparin gtt slightly to 700 units/hr and check level in [place], PharmD, BCPS  05/15/2017,3:02 AM

## 2017-05-15 NOTE — Progress Notes (Signed)
ANTICOAGULATION CONSULT NOTE - [place] for Heparin Indication: extensive DVT (currently on lytic therapy as well)  Allergies  Allergen Reactions  . Latex Itching, Rash and Other (See Comments)    Blisters for months on legs  . Mold Extract [Trichophyton] Anaphylaxis, Swelling and Rash  . Vancomycin Other (See Comments)    05/01/17: Pt noted to c/o redness and burning to R arm during IV Vancomycin 1g infusion. Infusion stopped and diphenhydramine 25mg  IV given.     Patient Measurements: Height: 4\' 9"  (144.8 cm) Weight: 158 lb (71.7 kg) IBW/kg (Calculated) : 38.6 Heparin Dosing Weight: 55 kg  Vital Signs: Temp: 98.3 F (36.8 C) (07/04 0923) Temp Source: Oral (07/04 0923) BP: 115/58 (07/04 0800) Pulse Rate: 63 (07/04 0800)  Labs:  Recent Labs  05/14/17 1107  05/14/17 1821 05/15/17 0144 05/15/17 0806  HGB  --   < > 10.1* 8.9* 9.1*  HCT  --   --  33.1* 29.1* 30.8*  PLT  --   --  314 264 266  APTT 29  --   --   --   --   HEPARINUNFRC  --   --  0.63 0.17* 0.18*  CREATININE  --   --  0.86 0.86  --   < > = values in this interval not displayed.  Estimated Creatinine Clearance: 46.9 mL/min (by C-G formula based on SCr of 0.86 mg/dL).   Medical History: Past Medical History:  Diagnosis Date  . Allergy   . Anemia   . Arthritis    left knee  . Back pain   . Bipolar disorder ([place])   . Chronic headaches   . Degenerative disorder of bone   . Depression   . Esophageal ulcer    history  . GERD (gastroesophageal reflux disease)   . Hyperlipidemia   . Peripheral vascular disease ([place])   . Pulmonary embolism ([place])   . Scoliosis     Medications:  Scheduled:  . buPROPion  300 mg Oral Daily  . calcium-vitamin D  1 tablet Oral Q breakfast  . gabapentin  600 mg Oral TID  . iron polysaccharides  150 mg Oral Daily  . lamoTRIgine  200 mg Oral QHS  . LORazepam  0.5 mg Oral BID  . magnesium oxide  400 mg Oral BID  . methocarbamol  500 mg Oral  TID  . rOPINIRole  1 mg Oral BH-q7a  . rOPINIRole  2 mg Oral TID  . sodium chloride flush  3 mL Intravenous Q12H  . ascorbic acid  250 mg Oral BID   Infusions:  . sodium chloride    . sodium chloride 69.5 mL/hr at 05/15/17 0800  . alteplase (LIMB ISCHEMIA) 10 mg in normal saline (0.02 mg/mL) infusion 0.25 mg/hr (05/15/17 0800)  . heparin 700 Units/hr (05/15/17 0800)    Assessment: 76 yo F with occluded IVC filter and significant DVT in the pelvis.  Seen by vasc outpt and admitted to initiate lytic therapy.  Was on Lovenox PTA (last dose 7/2 PM).  Catheter directed alteplase initiated 7/3 at 1546 drip rate 12.5 ml/hr to run until vascular intervention tomorrow  Peripheral heparin initiated at 800 units/hr (MD dose) 7/3  with initial HL 0.63.  Heparin drip rate reduced to decrease risk of bleed with lytic therapy HL goal 0.2-0.5  Heparin drip 650 u/h HL 0.17> increased 700 uts/h HL still only 0.18 CBC stable  Will increase heparin drip back to 800 uts/hr and recheck HL.  Goal of Therapy:  Heparin level 0.2-0.5 units/ml (reduced while on lytic therapy) Monitor platelets by anticoagulation protocol: Yes   Plan:  Increase  heparin to 800 units/hr. Adjust next heparin level time to be 6 hours after rate change.   Bonnita Nasuti Pharm.D. CPP, BCPS Clinical Pharmacist [phone] 05/15/2017 11:28 AM

## 2017-05-15 NOTE — Progress Notes (Signed)
   VASCULAR SURGERY ASSESSMENT & PLAN:   1 Day Post-Op s/p: Ileo-caval venogram and placement of thrombolytic lysis catheter with initiation of thrombolysis. FIBRINOGEN TODAY WAS 411. The patient is scheduled for possible angiolymphatic tomorrow and IVC filter removal.  Continue heparin.  SUBJECTIVE:   No complaints.  PHYSICAL EXAM:   Vitals:   05/15/17 0530 05/15/17 0600 05/15/17 0630 05/15/17 0700  BP: 140/77 (!) 146/79 130/65 (!) 149/74  Pulse: (!) 103 70 61 66  Resp: 15 20 16 12   Temp:      TempSrc:      SpO2: (!) 86% 100% 99% 100%  Weight:      Height:       Significant bilateral lower extremity swelling. Catheter sites look fine without any evidence of bleeding.  LABS:   Lab Results  Component Value Date   WBC 4.8 05/15/2017   HGB 8.9 (L) 05/15/2017   HCT 29.1 (L) 05/15/2017   MCV 100.0 05/15/2017   PLT 264 05/15/2017   Lab Results  Component Value Date   CREATININE 0.86 05/15/2017   Lab Results  Component Value Date   INR 1.14 05/02/2017    PROBLEM LIST:    Active Problems:   DVT (deep venous thrombosis) ([facility])   CURRENT MEDS:   . buPROPion  300 mg Oral Daily  . calcium-vitamin D  1 tablet Oral Q breakfast  . gabapentin  600 mg Oral TID  . iron polysaccharides  150 mg Oral Daily  . lamoTRIgine  200 mg Oral QHS  . LORazepam  0.5 mg Oral BID  . magnesium oxide  400 mg Oral BID  . methocarbamol  500 mg Oral TID  . rOPINIRole  1 mg Oral BH-q7a  . rOPINIRole  2 mg Oral TID  . sodium chloride flush  3 mL Intravenous Q12H  . ascorbic acid  250 mg Oral BID    Gae Gallop Beeper: [phone] Office: [phone] 05/15/2017

## 2017-05-15 NOTE — Progress Notes (Signed)
ANTICOAGULATION CONSULT NOTE - FOLLOW UP    HL = < 0.1 (goal 0.2 - 0.5 units/mL) Heparin dosing weight = 55 kg   Assessment: 75 YOF with occluded IVC filter and significant DVT in the pelvis to continue on IV heparin.  Patient is also on alteplase.  Heparin level is undetectable despite rate increases.  Confirmed with RN that heparin is infusing at 800 units/hr and there is no interruption/complication with heparin infusion.  No bleeding reported.   Plan: - Increase heparin gtt to 1000 units/hr - Check 6 hr heparin level   Audrey Eller D. Mina Marble, PharmD, BCPS 05/15/2017, 7:12 PM

## 2017-05-15 NOTE — Plan of Care (Signed)
Problem: Activity: Goal: Ability to return to baseline activity level will improve Outcome: Not Progressing Pt remains on bedrest with sheath in.  Problem: Cardiovascular: Goal: Ability to achieve and maintain adequate cardiovascular perfusion will improve Outcome: Progressing DP's present on doppler. Goal: Vascular access site(s) Level 0-1 will be maintained Outcome: Progressing Level 0 currently   Problem: Education: Goal: Understanding of CV disease, CV risk reduction, and recovery process will improve Outcome: Progressing Education ongoing

## 2017-05-15 NOTE — Care Management Note (Signed)
Case Management Note Marvetta Gibbons RN, BSN Unit 2W-Case Manager-- [place] coverage [phone]  Patient Details  Name: [name] May MRN: [id number] Date of Birth: [birth year]  Subjective/Objective:    Pt admitted s/p: Ileo-caval venogram and placement of thrombolytic lysis catheter with initiation of thrombolysis. The patient is scheduled for possible angiolymphatic tomorrow and IVC filter removal.                Action/Plan: PTA pt lived at home alone- has daughter and son that live nearby and are supportive- CM to follow  Expected Discharge Date:                  Expected Discharge Plan:  Home/Self Care  In-House Referral:  NA  Discharge planning Services  CM Consult  Post Acute Care Choice:  Home Health Choice offered to:  Patient  DME Arranged:    DME Agency:     HH Arranged:    [place] Agency:     Status of Service:  In process, will continue to follow  If discussed at Long Length of Stay Meetings, dates discussed:    Discharge Disposition:    Additional Comments:  Dawayne Patricia, RN 05/15/2017, 8:37 AM

## 2017-05-16 ENCOUNTER — Encounter (HOSPITAL_COMMUNITY)
Admission: AD | Disposition: A | Discharge status: Home / Self Care | Payer: Self-pay | Source: Ambulatory Visit | Attending: Surgery

## 2017-05-16 ENCOUNTER — Inpatient Hospital Stay (HOSPITAL_COMMUNITY): Admission: RE | Admit: 2017-05-16 | Payer: Medicare HMO | Source: Ambulatory Visit | Admitting: Vascular Surgery

## 2017-05-16 ENCOUNTER — Encounter (HOSPITAL_COMMUNITY): Payer: Self-pay | Admitting: Surgery

## 2017-05-16 ENCOUNTER — Inpatient Hospital Stay (HOSPITAL_COMMUNITY): Payer: Medicare HMO | Admitting: Certified Registered"

## 2017-05-16 DIAGNOSIS — I714 Abdominal aortic aneurysm, without rupture: Secondary | ICD-10-CM

## 2017-05-16 HISTORY — PX: IVC FILTER INSERTION: CATH118245

## 2017-05-16 HISTORY — PX: INSERTION OF ILIAC STENT: SHX6256

## 2017-05-16 HISTORY — PX: PERCUTANEOUS VENOUS THROMBECTOMY,LYSIS WITH INTRAVASCULAR ULTRASOUND (IVUS): SHX6751

## 2017-05-16 HISTORY — PX: FEMORAL ARTERY EXPLORATION: SHX5160

## 2017-05-16 HISTORY — PX: VENOGRAM: SHX5497

## 2017-05-16 HISTORY — DX: Peripheral vascular disease, unspecified: I73.9

## 2017-05-16 HISTORY — PX: VENA CAVA FILTER PLACEMENT: SHX1085

## 2017-05-16 LAB — POCT I-STAT, CHEM 8
BUN: 18 mg/dL (ref 6–20)
CREATININE: 0.9 mg/dL (ref 0.44–1.00)
Calcium, Ion: 1.24 mmol/L (ref 1.15–1.40)
Chloride: 104 mmol/L (ref 101–111)
Glucose, Bld: 77 mg/dL (ref 65–99)
HEMATOCRIT: 33 % — AB (ref 36.0–46.0)
HEMOGLOBIN: 11.2 g/dL — AB (ref 12.0–15.0)
POTASSIUM: 3.9 mmol/L (ref 3.5–5.1)
SODIUM: 142 mmol/L (ref 135–145)
TCO2: 27 mmol/L (ref 0–100)

## 2017-05-16 LAB — SURGICAL PCR SCREEN
MRSA, PCR: NEGATIVE
STAPHYLOCOCCUS AUREUS: NEGATIVE

## 2017-05-16 LAB — CBC
HEMATOCRIT: 29 % — AB (ref 36.0–46.0)
HEMOGLOBIN: 8.7 g/dL — AB (ref 12.0–15.0)
MCH: 30.1 pg (ref 26.0–34.0)
MCHC: 30 g/dL (ref 30.0–36.0)
MCV: 100.3 fL — AB (ref 78.0–100.0)
Platelets: 202 10*3/uL (ref 150–400)
RBC: 2.89 MIL/uL — AB (ref 3.87–5.11)
RDW: 13.7 % (ref 11.5–15.5)
WBC: 4.9 10*3/uL (ref 4.0–10.5)

## 2017-05-16 LAB — HEPARIN LEVEL (UNFRACTIONATED)
Heparin Unfractionated: 0.2 IU/mL — ABNORMAL LOW (ref 0.30–0.70)
Heparin Unfractionated: >2.2 IU/mL — ABNORMAL HIGH (ref 0.30–0.70)

## 2017-05-16 LAB — PREPARE RBC (CROSSMATCH)

## 2017-05-16 SURGERY — VENOGRAM
Anesthesia: General | Site: Leg Upper

## 2017-05-16 MED ORDER — LACTATED RINGERS IV SOLN
INTRAVENOUS | Status: DC | PRN
  Administered 2017-05-16 (×2): via INTRAVENOUS

## 2017-05-16 MED ORDER — IODIXANOL 320 MG/ML IV SOLN
INTRAVENOUS | Status: DC | PRN
  Administered 2017-05-16: 100 mL via INTRAVENOUS
  Administered 2017-05-16: 40 mL via INTRAVENOUS

## 2017-05-16 MED ORDER — ROCURONIUM BROMIDE 50 MG/5ML IV SOLN
INTRAVENOUS | Status: AC
  Filled 2017-05-16: qty 1

## 2017-05-16 MED ORDER — PHENYLEPHRINE HCL 10 MG/ML IJ SOLN
INTRAVENOUS | Status: DC | PRN
  Administered 2017-05-16: 25 ug/min via INTRAVENOUS

## 2017-05-16 MED ORDER — SODIUM CHLORIDE 0.9 % IV SOLN
INTRAVENOUS | Status: DC
  Administered 2017-05-16: 18:00:00 via INTRAVENOUS

## 2017-05-16 MED ORDER — ONDANSETRON HCL 4 MG/2ML IJ SOLN: 4.0000 mg | Freq: Four times a day (QID) | INTRAMUSCULAR | Status: DC | PRN

## 2017-05-16 MED ORDER — LACTATED RINGERS IV SOLN: INTRAVENOUS | Status: DC

## 2017-05-16 MED ORDER — ASPIRIN EC 325 MG PO TBEC
325.0000 mg | DELAYED_RELEASE_TABLET | Freq: Every day | ORAL | Status: DC
  Administered 2017-05-16 – 2017-05-17 (×2): 325 mg via ORAL
  Filled 2017-05-16 (×2): qty 1

## 2017-05-16 MED ORDER — PROPOFOL 10 MG/ML IV BOLUS
INTRAVENOUS | Status: DC | PRN
  Administered 2017-05-16: 110 mg via INTRAVENOUS

## 2017-05-16 MED ORDER — SODIUM CHLORIDE 0.9 % IV SOLN: Freq: Once | INTRAVENOUS | Status: DC

## 2017-05-16 MED ORDER — 0.9 % SODIUM CHLORIDE (POUR BTL) OPTIME
TOPICAL | Status: DC | PRN
  Administered 2017-05-16: 1000 mL

## 2017-05-16 MED ORDER — LIDOCAINE HCL (CARDIAC) 20 MG/ML IV SOLN
INTRAVENOUS | Status: AC
  Filled 2017-05-16: qty 5

## 2017-05-16 MED ORDER — PROMETHAZINE HCL 25 MG/ML IJ SOLN: 6.2500 mg | INTRAMUSCULAR | Status: DC | PRN

## 2017-05-16 MED ORDER — ASPIRIN EC 81 MG PO TBEC
81.0000 mg | DELAYED_RELEASE_TABLET | Freq: Every day | ORAL | Status: DC
  Administered 2017-05-18 – 2017-05-19 (×2): 81 mg via ORAL
  Filled 2017-05-16 (×2): qty 1

## 2017-05-16 MED ORDER — SUGAMMADEX SODIUM 200 MG/2ML IV SOLN
INTRAVENOUS | Status: DC | PRN
  Administered 2017-05-16: 150 mg via INTRAVENOUS

## 2017-05-16 MED ORDER — HEPARIN SODIUM (PORCINE) 1000 UNIT/ML IJ SOLN
INTRAMUSCULAR | Status: DC | PRN
  Administered 2017-05-16: 8000 [IU] via INTRAVENOUS
  Administered 2017-05-16 (×2): 3000 [IU] via INTRAVENOUS
  Administered 2017-05-16: 6000 [IU] via INTRAVENOUS

## 2017-05-16 MED ORDER — LACTATED RINGERS IV SOLN
INTRAVENOUS | Status: DC | PRN
  Administered 2017-05-16: 11:00:00 via INTRAVENOUS

## 2017-05-16 MED ORDER — DEXAMETHASONE SODIUM PHOSPHATE 10 MG/ML IJ SOLN
INTRAMUSCULAR | Status: DC | PRN
  Administered 2017-05-16: 10 mg via INTRAVENOUS

## 2017-05-16 MED ORDER — FENTANYL CITRATE (PF) 250 MCG/5ML IJ SOLN
INTRAMUSCULAR | Status: DC | PRN
  Administered 2017-05-16: 125 ug via INTRAVENOUS
  Administered 2017-05-16: 50 ug via INTRAVENOUS
  Administered 2017-05-16: 25 ug via INTRAVENOUS

## 2017-05-16 MED ORDER — PHENYLEPHRINE 40 MCG/ML (10ML) SYRINGE FOR IV PUSH (FOR BLOOD PRESSURE SUPPORT)
PREFILLED_SYRINGE | INTRAVENOUS | Status: DC | PRN
  Administered 2017-05-16: 80 ug via INTRAVENOUS
  Administered 2017-05-16 (×2): 120 ug via INTRAVENOUS

## 2017-05-16 MED ORDER — CEFUROXIME SODIUM 1.5 G IV SOLR
1.5000 g | Freq: Once | INTRAVENOUS | Status: DC
  Filled 2017-05-16: qty 1.5

## 2017-05-16 MED ORDER — DEXAMETHASONE SODIUM PHOSPHATE 10 MG/ML IJ SOLN
INTRAMUSCULAR | Status: AC
  Filled 2017-05-16: qty 2

## 2017-05-16 MED ORDER — LACTATED RINGERS IV SOLN
INTRAVENOUS | Status: DC
Start: 2017-05-16 — End: 2017-05-16
  Administered 2017-05-16: 10:00:00 via INTRAVENOUS

## 2017-05-16 MED ORDER — MIDAZOLAM HCL 2 MG/2ML IJ SOLN
INTRAMUSCULAR | Status: AC
  Filled 2017-05-16: qty 2

## 2017-05-16 MED ORDER — ONDANSETRON HCL 4 MG/2ML IJ SOLN
INTRAMUSCULAR | Status: AC
  Filled 2017-05-16: qty 4

## 2017-05-16 MED ORDER — HEPARIN (PORCINE) IN NACL 100-0.45 UNIT/ML-% IJ SOLN
1100.0000 [IU]/h | INTRAMUSCULAR | Status: DC
  Administered 2017-05-16: 1100 [IU]/h via INTRAVENOUS
  Filled 2017-05-16: qty 250

## 2017-05-16 MED ORDER — MEPERIDINE HCL 25 MG/ML IJ SOLN: 6.2500 mg | INTRAMUSCULAR | Status: DC | PRN

## 2017-05-16 MED ORDER — ONDANSETRON HCL 4 MG/2ML IJ SOLN
INTRAMUSCULAR | Status: DC | PRN
  Administered 2017-05-16: 4 mg via INTRAVENOUS

## 2017-05-16 MED ORDER — FENTANYL CITRATE (PF) 250 MCG/5ML IJ SOLN
INTRAMUSCULAR | Status: AC
  Filled 2017-05-16: qty 5

## 2017-05-16 MED ORDER — ALBUMIN HUMAN 5 % IV SOLN
INTRAVENOUS | Status: DC | PRN
  Administered 2017-05-16: 13:00:00 via INTRAVENOUS

## 2017-05-16 MED ORDER — ROCURONIUM BROMIDE 10 MG/ML (PF) SYRINGE
PREFILLED_SYRINGE | INTRAVENOUS | Status: DC | PRN
  Administered 2017-05-16: 35 mg via INTRAVENOUS
  Administered 2017-05-16: 20 mg via INTRAVENOUS
  Administered 2017-05-16: 15 mg via INTRAVENOUS

## 2017-05-16 MED ORDER — LIDOCAINE 2% (20 MG/ML) 5 ML SYRINGE
INTRAMUSCULAR | Status: DC | PRN
  Administered 2017-05-16: 40 mg via INTRAVENOUS

## 2017-05-16 MED ORDER — DEXTROSE 5 % IV SOLN
INTRAVENOUS | Status: DC | PRN
  Administered 2017-05-16: 1.5 g via INTRAVENOUS

## 2017-05-16 MED ORDER — FENTANYL CITRATE (PF) 100 MCG/2ML IJ SOLN
25.0000 ug | INTRAMUSCULAR | Status: DC | PRN
  Administered 2017-05-16: 50 ug via INTRAVENOUS
  Administered 2017-05-16: 25 ug via INTRAVENOUS

## 2017-05-16 MED ORDER — SODIUM CHLORIDE 0.9 % IV SOLN
INTRAVENOUS | Status: DC | PRN
  Administered 2017-05-16: 500 mL

## 2017-05-16 MED ORDER — ACETAMINOPHEN 325 MG PO TABS: 650.0000 mg | ORAL_TABLET | ORAL | Status: DC | PRN

## 2017-05-16 MED ORDER — MIDAZOLAM HCL 2 MG/2ML IJ SOLN
INTRAMUSCULAR | Status: DC | PRN
  Administered 2017-05-16: 1 mg via INTRAVENOUS

## 2017-05-16 SURGICAL SUPPLY — 128 items
ADH SKN CLS APL DERMABOND .7 (GAUZE/BANDAGES/DRESSINGS) ×4
BAG DECANTER FOR FLEXI CONT (MISCELLANEOUS) ×5 IMPLANT
BAG SNAP BAND KOVER 36X36 (MISCELLANEOUS) ×4 IMPLANT
BALLN ATLAS 14X40X75 (BALLOONS) ×10
BALLN MUSTANG 12X20X75 (BALLOONS) ×5
BALLN MUSTANG 7X80X75 (BALLOONS) ×5
BALLN MUSTANG 8.0X40 75 (BALLOONS) ×5
BALLOON ATLAS 14X40X75 (BALLOONS) IMPLANT
BALLOON MUSTANG 12X20X75 (BALLOONS) IMPLANT
BALLOON MUSTANG 7X80X75 (BALLOONS) IMPLANT
BALLOON MUSTANG 8.0X40 75 (BALLOONS) IMPLANT
BANDAGE ACE 4X5 VEL STRL LF (GAUZE/BANDAGES/DRESSINGS) IMPLANT
BANDAGE ELASTIC 4 VELCRO ST LF (GAUZE/BANDAGES/DRESSINGS) ×2 IMPLANT
BANDAGE ELASTIC 6 VELCRO ST LF (GAUZE/BANDAGES/DRESSINGS) ×2 IMPLANT
BLADE SURG 11 STRL SS (BLADE) ×5 IMPLANT
CANISTER SUCT 3000ML PPV (MISCELLANEOUS) ×5 IMPLANT
CANNULA DILATOR 20D C (CANNULA) ×1 IMPLANT
CANNULA OPTISITE PERFUSION 16F (CANNULA) ×1 IMPLANT
CATH ANGIO 5F BER2 100CM (CATHETERS) ×1 IMPLANT
CATH ANGIO 5F BER2 65CM (CATHETERS) ×5 IMPLANT
CATH OMNI FLUSH .035X70CM (CATHETERS) IMPLANT
CATH QUICKCROSS SUPP .035X90CM (MICROCATHETER) ×1 IMPLANT
CATH ROBINSON RED A/P 18FR (CATHETERS) ×1 IMPLANT
CATH VISIONS PV .035 IVUS (CATHETERS) ×1 IMPLANT
COVER BACK TABLE 60X90IN (DRAPES) ×4 IMPLANT
COVER DOME SNAP 22 D (MISCELLANEOUS) ×5 IMPLANT
COVER PROBE W GEL 5X96 (DRAPES) ×5 IMPLANT
COVER SURGICAL LIGHT HANDLE (MISCELLANEOUS) ×5 IMPLANT
DERMABOND ADVANCED (GAUZE/BANDAGES/DRESSINGS) ×1
DERMABOND ADVANCED .7 DNX12 (GAUZE/BANDAGES/DRESSINGS) ×4 IMPLANT
DEVICE TORQUE H2O (MISCELLANEOUS) ×1 IMPLANT
DEVICE TORQUE KENDALL .025-038 (MISCELLANEOUS) ×1 IMPLANT
DRAIN CHANNEL 15F RND FF W/TCR (WOUND CARE) IMPLANT
DRAPE C-ARM 42X72 X-RAY (DRAPES) ×4 IMPLANT
DRAPE FEMORAL ANGIO 80X135IN (DRAPES) ×1 IMPLANT
DRAPE INCISE IOBAN 66X45 STRL (DRAPES) ×1 IMPLANT
DRAPE LAPAROTOMY T 102X78X121 (DRAPES) ×4 IMPLANT
DRAPE ORTHO SPLIT 77X108 STRL (DRAPES) ×10
DRAPE SURG ORHT 6 SPLT 77X108 (DRAPES) IMPLANT
DRAPE X-RAY CASS 24X20 (DRAPES) IMPLANT
DRSG TEGADERM 4X4.75 (GAUZE/BANDAGES/DRESSINGS) ×4 IMPLANT
DRYSEAL FLEXSHEATH 16FR 33CM (SHEATH) ×1
DRYSEAL FLEXSHEATH 26FR 33CM (SHEATH) ×1
ELECT ECG MONITOR RDN ADLT (ELECTRODE) ×5
ELECT EKG TRDRP CNDCT ADH (ELECTRODE) IMPLANT
ELECT REM PT RETURN 9FT ADLT (ELECTROSURGICAL)
ELECTRODE REM PT RTRN 9FT ADLT (ELECTROSURGICAL) IMPLANT
EVACUATOR SILICONE 100CC (DRAIN) IMPLANT
GAUZE SPONGE 2X2 8PLY STRL LF (GAUZE/BANDAGES/DRESSINGS) IMPLANT
GAUZE SPONGE 4X4 12PLY STRL (GAUZE/BANDAGES/DRESSINGS) ×5 IMPLANT
GAUZE SPONGE 4X4 12PLY STRL LF (GAUZE/BANDAGES/DRESSINGS) ×4 IMPLANT
GAUZE SPONGE 4X4 16PLY XRAY LF (GAUZE/BANDAGES/DRESSINGS) ×6 IMPLANT
GLOVE BIO SURGEON STRL SZ7.5 (GLOVE) ×5 IMPLANT
GLOVE BIOGEL PI IND STRL 7.5 (GLOVE) IMPLANT
GLOVE BIOGEL PI IND STRL 8 (GLOVE) ×4 IMPLANT
GLOVE BIOGEL PI INDICATOR 7.5 (GLOVE) ×1
GLOVE BIOGEL PI INDICATOR 8 (GLOVE)
GLOVE SURG SS PI 7.0 STRL IVOR (GLOVE) ×1 IMPLANT
GLOVE SURG SS PI 7.5 STRL IVOR (GLOVE) ×1 IMPLANT
GOWN STRL REUS W/ TWL LRG LVL3 (GOWN DISPOSABLE) ×8 IMPLANT
GOWN STRL REUS W/ TWL XL LVL3 (GOWN DISPOSABLE) ×4 IMPLANT
GOWN STRL REUS W/TWL LRG LVL3 (GOWN DISPOSABLE) ×10
GOWN STRL REUS W/TWL XL LVL3 (GOWN DISPOSABLE) ×10
GUIDEWIRE AMPLATZ SS .035X260 (WIRE) ×1 IMPLANT
GUIDEWIRE ANGLED .035X150CM (WIRE) ×2 IMPLANT
GUIDEWIRE ANGLED .035X260CM (WIRE) ×1 IMPLANT
INTRODUCER PERFORM 14 30 .038 (SHEATH) ×1 IMPLANT
KIT BASIN OR (CUSTOM PROCEDURE TRAY) ×5 IMPLANT
KIT DILATOR VASC 18G NDL (KITS) ×1 IMPLANT
KIT ENCORE 26 ADVANTAGE (KITS) ×2 IMPLANT
KIT ROOM TURNOVER OR (KITS) ×5 IMPLANT
MARKER SKIN DUAL TIP RULER LAB (MISCELLANEOUS) ×1 IMPLANT
NDL HYPO 25GX1X1/2 BEV (NEEDLE) ×4 IMPLANT
NDL PERC 18GX7CM (NEEDLE) ×4 IMPLANT
NEEDLE HYPO 25GX1X1/2 BEV (NEEDLE) IMPLANT
NEEDLE PERC 18GX7CM (NEEDLE) ×5 IMPLANT
NS IRRIG 1000ML POUR BTL (IV SOLUTION) ×5 IMPLANT
PACK SURGICAL SETUP 50X90 (CUSTOM PROCEDURE TRAY) ×1 IMPLANT
PAD ARMBOARD 7.5X6 YLW CONV (MISCELLANEOUS) ×10 IMPLANT
PADDING CAST ABS 6INX4YD NS (CAST SUPPLIES)
PADDING CAST ABS COTTON 6X4 NS (CAST SUPPLIES) IMPLANT
PROTECTION STATION PRESSURIZED (MISCELLANEOUS) ×5
PUMP SARN DELFIN (MISCELLANEOUS) ×1 IMPLANT
SET COLLECT BLD 21X3/4 12 (NEEDLE) IMPLANT
SHEATH AVANTI 11CM 5FR (MISCELLANEOUS) ×1 IMPLANT
SHEATH BRITE TIP 8FR 35CM (SHEATH) ×1 IMPLANT
SHEATH DRYSEAL FLEX 16FR 33CM (SHEATH) IMPLANT
SHEATH DRYSEAL FLEX 26FR 33CM (SHEATH) IMPLANT
SHEATH FAST CATH 10F 12CM (SHEATH) ×1 IMPLANT
SHEATH FLEX ANSEL ANG 5F 45CM (SHEATH) ×1 IMPLANT
SHEATH PINNACLE 8F 10CM (SHEATH) ×2 IMPLANT
SHIELD RADPAD SCOOP 12X17 (MISCELLANEOUS) ×1 IMPLANT
SLEEVE ISOL F/PACE RF HD COVER (MISCELLANEOUS) ×1 IMPLANT
SLEEVE SURGEON STRL (DRAPES) ×1 IMPLANT
SNARE GOOSENECK 15MM (MISCELLANEOUS) ×1 IMPLANT
SPONGE GAUZE 2X2 STER 10/PKG (GAUZE/BANDAGES/DRESSINGS) ×1
STATION PROTECTION PRESSURIZED (MISCELLANEOUS) IMPLANT
STENT WALLSTENT 18X90X75 (Permanent Stent) ×2 IMPLANT
STENT WALLSTENTÂ 16X90X75 (Permanent Stent) ×2 IMPLANT
STOPCOCK MORSE 400PSI 3WAY (MISCELLANEOUS) ×5 IMPLANT
SUT ETHILON 3 0 PS 1 (SUTURE) ×1 IMPLANT
SUT MNCRL AB 4-0 PS2 18 (SUTURE) IMPLANT
SUT PROLENE 5 0 C 1 24 (SUTURE) IMPLANT
SUT PROLENE 6 0 BV (SUTURE) IMPLANT
SUT PROLENE 7 0 BV 1 (SUTURE) IMPLANT
SUT SILK 2 0 FS (SUTURE) ×4 IMPLANT
SUT SILK 2 0 SH (SUTURE) IMPLANT
SUT VIC AB 2-0 CT1 27 (SUTURE)
SUT VIC AB 2-0 CT1 TAPERPNT 27 (SUTURE) IMPLANT
SUT VIC AB 3-0 SH 27 (SUTURE)
SUT VIC AB 3-0 SH 27X BRD (SUTURE) IMPLANT
SUT VICRYL 4-0 PS2 18IN ABS (SUTURE) ×4 IMPLANT
SYR 10ML LL (SYRINGE) ×8 IMPLANT
SYR 20CC LL (SYRINGE) ×6 IMPLANT
SYR 30ML LL (SYRINGE) ×6 IMPLANT
SYR 5ML LL (SYRINGE) ×5 IMPLANT
SYR CONTROL 10ML LL (SYRINGE) ×4 IMPLANT
SYR MEDRAD MARK V 150ML (SYRINGE) IMPLANT
SYRINGE 20CC LL (MISCELLANEOUS) ×1 IMPLANT
TRAY FOLEY W/METER SILVER 16FR (SET/KITS/TRAYS/PACK) IMPLANT
TUBING EXTENTION W/L.L. (IV SETS) IMPLANT
TUBING HIGH PRESSURE 120CM (CONNECTOR) ×4 IMPLANT
UNDERPAD 30X30 (UNDERPADS AND DIAPERS) IMPLANT
WATER STERILE IRR 1000ML POUR (IV SOLUTION) ×4 IMPLANT
WIRE AMPLATZ SS-J .035X180CM (WIRE) ×1 IMPLANT
WIRE AMPLATZ SS-J .035X260CM (WIRE) ×2 IMPLANT
WIRE BENTSON .035X145CM (WIRE) ×5 IMPLANT
WIRE J 3MM .035X145CM (WIRE) ×4 IMPLANT

## 2017-05-16 NOTE — Progress Notes (Signed)
ANTICOAGULATION CONSULT NOTE - FOLLOW UP    HL = N/A (goal 0.3 - 0.7 units/mL) Heparin dosing weight = 55 kg   Assessment: 75 YOF with occluded IVC filter and significant DVT in the pelvis.  Patient was off of heparin infusion around 1100 today for retrieval of IVC filter, thrombectomy/lysis, and bilateral common/external iliac and femoral veins stenting.  Pharmacy consulted to resume IV heparin at 2000.  Elevated heparin level at 1621 is likely from heparin received intra-operatively.  Confirmed with Dr. Donzetta Matters that heparin goal could be liberalized.  Patient is currently off of alteplase.  No bleeding per RN.  Heparin level was at 0.2 units/mL this AM on 1000 units/hr.   Plan: - At 2000, resume heparin gtt at 1100 units/hr, no bolus - Check 8 hr heparin level - Daily heparin level and CBC - Monitor closely for s/sx of bleeding   [name] May D. Mina Marble, PharmD, BCPS Pager:  [phone] 05/16/2017, 5:56 PM

## 2017-05-16 NOTE — Anesthesia Preprocedure Evaluation (Addendum)
Anesthesia Evaluation  Patient identified by MRN, date of birth, ID band Patient awake    Reviewed: Allergy & Precautions, NPO status , Patient's Chart, lab work & pertinent test results  Airway Mallampati: I  TM Distance: >3 FB Neck ROM: Full    Dental  (+) Upper Dentures, Lower Dentures   Pulmonary former smoker,    breath sounds clear to auscultation       Cardiovascular + Peripheral Vascular Disease   Rhythm:Regular Rate:Normal     Neuro/Psych  Headaches, PSYCHIATRIC DISORDERS Depression Bipolar Disorder    GI/Hepatic Neg liver ROS, PUD, GERD  Medicated,  Endo/Other    Renal/GU      Musculoskeletal  (+) Arthritis ,   Abdominal Normal abdominal exam  (+)   Peds  Hematology negative hematology ROS (+)   Anesthesia Other Findings - HLD  Reproductive/Obstetrics negative OB ROS                            Lab Results  Component Value Date   WBC 4.9 05/16/2017   HGB 8.7 (L) 05/16/2017   HCT 29.0 (L) 05/16/2017   MCV 100.3 (H) 05/16/2017   PLT 202 05/16/2017   Lab Results  Component Value Date   CREATININE 0.86 05/15/2017   BUN 13 05/15/2017   NA 136 05/15/2017   K 3.7 05/15/2017   CL 108 05/15/2017   CO2 25 05/15/2017   Lab Results  Component Value Date   INR 1.14 05/02/2017   INR 2.94 (H) 05/26/2015   INR 2.08 (H) 02/15/2015   EKG: normal sinus rhythm.  Anesthesia Physical Anesthesia Plan  ASA: III  Anesthesia Plan: General   Post-op Pain Management:    Induction: Intravenous  PONV Risk Score and Plan: 4 or greater and Ondansetron, Dexamethasone, Propofol and Midazolam  Airway Management Planned: Oral ETT  Additional Equipment: Arterial line  Intra-op Plan:   Post-operative Plan: Extubation in OR  Informed Consent: I have reviewed the patients History and Physical, chart, labs and discussed the procedure including the risks, benefits and alternatives for the  proposed anesthesia with the patient or authorized representative who has indicated his/her understanding and acceptance.   Dental advisory given  Plan Discussed with: CRNA  Anesthesia Plan Comments:         Anesthesia Quick Evaluation

## 2017-05-16 NOTE — Anesthesia Postprocedure Evaluation (Signed)
Anesthesia Post Note  Patient: [name] May  Procedure(s) Performed: Procedure(s) (LRB): VENOGRAM OF IVC (Bilateral) ULTRASOUND GUIDED CANULATED LEFT & RIGHT INTERNAL JUGULAR (N/A) RETRIEVAL INFERIOR VENA-CAVA FILTER (N/A) PERCUTANEOUS VENOUS THROMBECTOMY AND LYSIS WITH INTRAVASCULAR ULTRASOUND (IVUS) (N/A) MECHANICAL THROMBECTOMY OF IVC FILTER (N/A) STENT OF BILATERAL COMMON AND EXTERNAL ILIAC VEIN WITH 18 X 90 WALL STENT (Bilateral) STENT OF BILATERAL COMMON FEMORAL VEIN WITH 16 X 90 WALL STENT     Patient location during evaluation: PACU Anesthesia Type: General Level of consciousness: awake and alert Pain management: pain level controlled Vital Signs Assessment: post-procedure vital signs reviewed and stable Respiratory status: spontaneous breathing, nonlabored ventilation, respiratory function stable and patient connected to nasal cannula oxygen Cardiovascular status: blood pressure returned to baseline and stable Postop Assessment: no signs of nausea or vomiting Anesthetic complications: no    Last Vitals:  Vitals:   05/16/17 1653 05/16/17 1700  BP:  (!) 154/77  Pulse: 91 90  Resp: 20 12  Temp: (!) 36.1 C 36.4 C    Last Pain:  Vitals:   05/16/17 1700  TempSrc: Oral  PainSc:                  Effie Berkshire

## 2017-05-16 NOTE — Op Note (Signed)
    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  US guided cannulation of bilateral internal jugular veins   2.  Venogram of ivc   3.  Mechanical thrombectomy of ivc and left common iliac vein with angiovac   4.  IVC filter retrieval   5.  IVUS of ivc, bilateral common, bilateral external iliac veins and bilateral common femoral veins   6.  Stent of bilateral common iliac and external iliac veins with 18 x 90 wallstent   7.  Stent of bilateral common femoral veins with 16 x90 wallstent Anesthesia:  General Blood Loss:  See anesthesia record Specimens:  none  Findings:   The IVC filter was occluded although there was a channel where the lysis catheter had been. Suprarenal IVC was patent as were the renal veins bilaterally. 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 There was acute thrombus in the left sided common iliac and external iliac veins as well as the common femoral vein on the left. Following AngioJet back mechanical thrombectomy and filter removal the IVC was then patent. Stenting was performed of the bilateral common iliac, bilateral external iliac veins as well as the bilateral common femoral veins. At completion the flow was in-line from the right femoral vein through the IVC. The left-sided femoral vein remains thrombosed and at high risk for thrombosing the aforementioned left-sided stents.  Indications:  76 year old female with thrombus within her IVC filter and ilio-femoral veins.  2 days ago, a lysis catheter was placed in the right iliac vein and IVC.  She is here for filter removal and thrombus extraction.  Procedure:  The patient was identified in the holding area and taken to [facility] [facility]  The patient was then  placed supine on the table. general anesthesia was administered.  The patient was prepped and draped in the usual sterile fashion.  A time out was called and antibiotics were administered.  Please see Dr. Claretha Cooper full operative note.  2 surgeons were required for angiovac extraction of IVC thrombus and treatment of residual disease.  I helped macerate clot within the right iliac venous system as well as deployment of the right common iliac, external iliac, and common femoral veins, as well as IVUS evaluation.   Disposition:  To PACU stable   V. [name], M.D. [facility] Office: [phone] Pager:  [phone]

## 2017-05-16 NOTE — Transfer of Care (Signed)
Immediate Anesthesia Transfer of Care Note  Patient: [name] May  Procedure(s) Performed: Procedure(s): VENOGRAM OF IVC (Bilateral) ULTRASOUND GUIDED CANULATED LEFT & RIGHT INTERNAL JUGULAR (N/A) RETRIEVAL INFERIOR VENA-CAVA FILTER (N/A) PERCUTANEOUS VENOUS THROMBECTOMY AND LYSIS WITH INTRAVASCULAR ULTRASOUND (IVUS) (N/A) MECHANICAL THROMBECTOMY OF IVC FILTER (N/A) STENT OF BILATERAL COMMON AND EXTERNAL ILIAC VEIN WITH 18 X 90 WALL STENT (Bilateral) STENT OF BILATERAL COMMON FEMORAL VEIN WITH 16 X 90 WALL STENT  Patient Location: PACU  Anesthesia Type:General  Level of Consciousness: awake, alert , oriented and patient cooperative  Airway & Oxygen Therapy: Patient Spontanous Breathing and Patient connected to nasal cannula oxygen  Post-op Assessment: Report given to RN and Post -op Vital signs reviewed and stable  Post vital signs: Reviewed and stable  Last Vitals:  Vitals:   05/16/17 0800 05/16/17 1614  BP: 139/76   Pulse: 74   Resp: 13   Temp: 36.7 C (!) 36.4 C    Last Pain:  Vitals:   05/16/17 0853  TempSrc:   PainSc: 1       Patients Stated Pain Goal: 2 (33/61/22 4497)  Complications: No apparent anesthesia complications

## 2017-05-16 NOTE — Anesthesia Procedure Notes (Signed)
Arterial Line Insertion Start/End7/03/2017 10:20 AM, 05/16/2017 10:24 AM Performed by: Clearnce Sorrel, CRNA  Patient location: Pre-op. Preanesthetic checklist: patient identified, IV checked, site marked, risks and benefits discussed, surgical consent, monitors and equipment checked, pre-op evaluation, timeout performed and anesthesia consent Lidocaine 1% used for infiltration Left, radial was placed Catheter size: 20 Fr Hand hygiene performed  and maximum sterile barriers used   Attempts: 1 Procedure performed without using ultrasound guided technique. Following insertion, dressing applied. Post procedure assessment: normal and unchanged

## 2017-05-16 NOTE — Progress Notes (Signed)
  Progress Note    05/16/2017 9:00 AM 2 Days Post-Op  Subjective:  Legs are feeling well this a.m.  Vitals:   05/16/17 0710 05/16/17 0800  BP:  139/76  Pulse:  74  Resp:  13  Temp: 98.8 F (37.1 C) 98.1 F (36.7 C)    Physical Exam: aaox3 Neuro in tact all 4 extremities Abdomen is soft Legs with minimal swelling Palpable dp bilaterally  CBC    Component Value Date/Time   WBC 4.9 05/16/2017 0335   RBC 2.89 (L) 05/16/2017 0335   HGB 8.7 (L) 05/16/2017 0335   HCT 29.0 (L) 05/16/2017 0335   PLT 202 05/16/2017 0335   MCV 100.3 (H) 05/16/2017 0335   MCH 30.1 05/16/2017 0335   MCHC 30.0 05/16/2017 0335   RDW 13.7 05/16/2017 0335    BMET    Component Value Date/Time   NA 136 05/15/2017 0144   K 3.7 05/15/2017 0144   CL 108 05/15/2017 0144   CO2 25 05/15/2017 0144   GLUCOSE 81 05/15/2017 0144   BUN 13 05/15/2017 0144   CREATININE 0.86 05/15/2017 0144   CALCIUM 8.1 (L) 05/15/2017 0144   GFRNONAA >60 05/15/2017 0144   GFRAA >60 05/15/2017 0144    INR    Component Value Date/Time   INR 1.14 05/02/2017 0616     Intake/Output Summary (Last 24 hours) at 05/16/17 0900 Last data filed at 05/16/17 0800  Gross per 24 hour  Intake           1766.7 ml  Output             3575 ml  Net          -1808.3 ml     Assessment:  76 y.o. female is s/p lysis from left femoral vein for occluded ivc filter  Plan: OR today with plan for recanalization of bilateral iliac veins with possible mechanical thrombectomy using angiovac system, possible stenting, possible ivc filter removal. We have again discussed the risks of fatal pe, bleeding, cannulation site complications and she agrees to proceed.    Brandon C. Donzetta Matters, MD [facility] Office: [phone] Pager: [phone]  05/16/2017 9:00 AM

## 2017-05-16 NOTE — Anesthesia Procedure Notes (Signed)
Procedure Name: Intubation Date/Time: 05/16/2017 10:52 AM Performed by: Mervyn Gay Pre-anesthesia Checklist: Patient identified, Patient being monitored, Timeout performed, Emergency Drugs available and Suction available Patient Re-evaluated:Patient Re-evaluated prior to inductionOxygen Delivery Method: Circle System Utilized Preoxygenation: Pre-oxygenation with 100% oxygen Intubation Type: IV induction Ventilation: Mask ventilation without difficulty Laryngoscope Size: Miller and 3 Grade View: Grade I Tube type: Oral Tube size: 7.0 mm Number of attempts: 1 Airway Equipment and Method: Stylet Placement Confirmation: ETT inserted through vocal cords under direct vision,  positive ETCO2 and breath sounds checked- equal and bilateral Secured at: 20 cm Tube secured with: Tape Dental Injury: Teeth and Oropharynx as per pre-operative assessment

## 2017-05-16 NOTE — Progress Notes (Signed)
ANTICOAGULATION CONSULT NOTE - Follow Up Consult  Pharmacy Consult for Heparin Indication: DVT  Patient Measurements: Height: 4\' 9"  (144.8 cm) Weight: 158 lb (71.7 kg) IBW/kg (Calculated) : 38.6  Vital Signs: Temp: 98.6 F (37 C) (07/05 0340) Temp Source: Oral (07/05 0340) BP: 95/48 (07/05 0300) Pulse Rate: 73 (07/05 0300)  Labs:  Recent Labs  05/14/17 1107  05/14/17 1821 05/15/17 0144 05/15/17 0806 05/15/17 1420 05/15/17 1646 05/16/17 0200 05/16/17 0335  HGB  --   < > 10.1* 8.9* 9.1* 9.5*  --   --  8.7*  HCT  --   < > 33.1* 29.1* 30.8* 31.4*  --   --  29.0*  PLT  --   < > 314 264 266 257  --   --  202  APTT 29  --   --   --   --   --   --   --   --   HEPARINUNFRC  --   < > 0.63 0.17* 0.18*  --  <0.10* 0.20*  --   CREATININE  --   --  0.86 0.86  --   --   --   --   --   < > = values in this interval not displayed.  Estimated Creatinine Clearance: 46.9 mL/min (by C-G formula based on SCr of 0.86 mg/dL).   Assessment: 76 y/o F on heparin drip for DVT, also receiving lytic therapy with IV alteplase (vascular following), heparin level within therapeutic range this AM.   Goal of Therapy:  Heparin level 0.2-0.5 units/ml Monitor platelets by anticoagulation protocol: Yes   Plan:  -Cont heparin at 1000 units/hr -1200 HL -F/U vascular plans today  Narda Bonds 05/16/2017,4:01 AM

## 2017-05-16 NOTE — Op Note (Signed)
Patient name: [name] May MRN: [id number] DOB: July 24, 1941 Sex: female  05/16/2017 Pre-operative Diagnosis: ivc and bilateral iliofemoral thrombosis Post-operative diagnosis:  Same Surgeons:  Eda Paschal. Donzetta Matters, MD  [name], MD Procedure Performed: 1.  US guided cannulation of bilateral internal jugular veins 2.  Venogram of ivc 3.  Mechanical thrombectomy of ivc and left common iliac vein with angiovac 4.  IVC filter retrieval 5.  IVUS of ivc, bilateral common, bilateral external iliac veins and bilateral common femoral veins 6.  Stent of bilateral common iliac and external iliac veins with 18 x 90 wallstent 7.  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 She now no longer has GI bleed filter has been placed for 2 years. She presented with thrombosis of her entire left lower extremity as well as her IVC filter bilateral common iliac veins. She has been undergoing lysis from the left femoral approach for 2 days. She is now indicated for lysis recheck with the above possible intervention.  Findings: The IVC filter was occluded although there was a channel where the lysis catheter had been. Suprarenal IVC was patent as were the renal veins bilaterally. 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 There was acute thrombus in the left sided common iliac and external iliac veins as well as the common femoral vein on the left. Following AngioJet back mechanical thrombectomy and filter removal the IVC was then patent. Stenting was performed of the bilateral common iliac, bilateral external iliac veins as well as the bilateral common femoral veins. At completion the flow was in-line from the right femoral vein through the IVC. The left-sided femoral vein remains thrombosed and at high risk for thrombosing the aforementioned left-sided  stents.   Procedure:  The patient was identified in the holding area and taken to the operating room where she was placed supine on the operating table general anesthesia was induced she was given antibiotics and sterilely prepped and draped in her bilateral groins bilateral neck. Through the existing sheath in the left femoral vein were able to exchange the lysis catheter for a stiff Amplatz wire into the left innominate vein. We then got ultrasound guided cannulation of the right internal jugular vein and placed a 5 French sheath. Using Berenstein catheter and Glidewire we were able to maneuver into the IVC and through the filter. We performed venography which demonstrated the occlusion of the filter as well as takeoff of the right common femoral vein. We then performed IVIS of the entire left side up into the IVC from the left sheath which was exchanged for 8 French sheath. Through the existing right sided sheath we then attempted to cross the chronic occlusion and performed venography. Ultimately we were able to get cross the occlusion from above and our IJ cannulation site. Patient was heparinized at this time with 8000 units of heparin and ultimately she would receive 20,000 units of heparin throughout the case. After a through the chronic occlusion on the right we confirmed with venogram that we were intraluminal. From the right common femoral sheath we were then able to snare a Glidewire and exchanged through quick cross catheter for a stiff Amplatz wire. We then performed IVIS of the entire right iliac system and into the IVC with findings of chronic occlusion and acute on chronic occlusion of the IVC filter. We then serially dilated  the right iliac sided tract with 7 x 100 mm balloon. Following this we elected to proceed with angiogram thrombectomy. We initially traded out our left sided sheath for the 16 French red infusion cannula. This was quite difficult and required serial dilatations. At the same  time we his ultrasound guidance to cannulate the left internal jugular vein place a J-wire and a 5 [place] sheath. Using a Benson wire and bare catheter we able to get into the IVC and then exchanged for stiff wire and placed a long 12 French sheath to just above the level of the filter. On the right IJ we then placed a stiff wire and serially dilated up to 20 [place] and then placed a long 26 French sheath to just above the filter. The patient had been heparinized and ACT was confirmed at 300. The long sheath from the right IJ was then placed through the IVC filter and the angiographic cannula was brought to just below. We hooked up to the perfusion closed veno-venous system after de-airing. After our flow rates reached over 2 L/m we then advanced our angiogram cannula through our filter down into the left common iliac vein. We then deflated our balloon retreated back above the filter and attempted balloon maceration of the clot within the filter from the right femoral approach. The time we then reinflated balloon just below the right atrium and through our 12 French sheath in the left IJ we brought a snare and were able to easily snare the filter. 12 French sheath was advanced over the filter and filter was retrieved and noted to be fully intact. We performed venography demonstrated that there were no areas of extravasation from the IVC. Venogram did not demonstrate any further acute clot from below. Satisfied with this the left IJ sheath was retracted into the left innominate vein and the Angiovac catheter was removed.  Blood was then returned to the patient and the right IJ sheath was retracted to the right innominate vein. We then exchanged the left sided infusion cannula for a 16 French sheath and the right sided femoral sheath for a 10 French sheath. We then elected to stent from the confluence of the IVC with bilateral 18 x 90 Wallstent into the external iliac veins bilaterally. This was performed  simultaneously. Completion IVUS demonstrated persistent areas of disease on the right and left. We then extended stents with 16 x 90 wall stents to the common femoral veins bilaterally and postdilated with 14 mm balloon. After we had done that the right side demonstrated risk flow through the entire system into the IVC the left side still had acute and chronic appearing clot which we'll put the left side at risk of continued symptoms and possibly thrombosing the stents. Satisfied with our results we then placed U stitches with red rubber catheters in place over all of our sheaths and remove them. Pressure was held for a period of 10 minutes. Cannulation site and they were all hemostatic. Patient Place will be dressed with Ace wraps. She tolerated procedure well without immediate complication. All counts were correct at completion.   Blood loss: 150cc  Contrast: 140cc  Angiovac pump time: 27 minutes     Arnelle Nale C. Donzetta Matters, MD [facility] [place] Office: [phone] Pager: [phone]

## 2017-05-17 ENCOUNTER — Encounter (HOSPITAL_COMMUNITY): Payer: Self-pay | Admitting: Vascular Surgery

## 2017-05-17 LAB — BASIC METABOLIC PANEL
ANION GAP: 3 — AB (ref 5–15)
BUN: 8 mg/dL (ref 6–20)
CALCIUM: 8 mg/dL — AB (ref 8.9–10.3)
CHLORIDE: 106 mmol/L (ref 101–111)
CO2: 28 mmol/L (ref 22–32)
Creatinine, Ser: 0.69 mg/dL (ref 0.44–1.00)
GFR calc non Af Amer: >60 mL/min (ref >60)
Glucose, Bld: 132 mg/dL — ABNORMAL HIGH (ref 65–99)
POTASSIUM: 3.8 mmol/L (ref 3.5–5.1)
Sodium: 137 mmol/L (ref 135–145)

## 2017-05-17 LAB — POCT ACTIVATED CLOTTING TIME
ACTIVATED CLOTTING TIME: 219 s
ACTIVATED CLOTTING TIME: 323 s
ACTIVATED CLOTTING TIME: 285 s

## 2017-05-17 LAB — CBC
HEMATOCRIT: 27.3 % — AB (ref 36.0–46.0)
HEMOGLOBIN: 8.1 g/dL — AB (ref 12.0–15.0)
MCH: 29.6 pg (ref 26.0–34.0)
MCHC: 29.7 g/dL — ABNORMAL LOW (ref 30.0–36.0)
MCV: 99.6 fL (ref 78.0–100.0)
Platelets: 184 10*3/uL (ref 150–400)
RBC: 2.74 MIL/uL — AB (ref 3.87–5.11)
RDW: 13.7 % (ref 11.5–15.5)
WBC: 6.2 10*3/uL (ref 4.0–10.5)

## 2017-05-17 LAB — HEPARIN LEVEL (UNFRACTIONATED): Heparin Unfractionated: 0.41 IU/mL (ref 0.30–0.70)

## 2017-05-17 MED ORDER — APIXABAN 5 MG PO TABS
10.0000 mg | ORAL_TABLET | Freq: Two times a day (BID) | ORAL | Status: DC
  Administered 2017-05-17 – 2017-05-19 (×5): 10 mg via ORAL
  Filled 2017-05-17 (×7): qty 2

## 2017-05-17 MED ORDER — APIXABAN 5 MG PO TABS: 5.0000 mg | ORAL_TABLET | Freq: Two times a day (BID) | ORAL | Status: DC

## 2017-05-17 NOTE — Discharge Instructions (Signed)
Information on my medicine - ELIQUIS (apixaban)  This medication education was reviewed with me or my healthcare representative as part of my discharge preparation.  The pharmacist that spoke with me during my hospital stay was:  Leroy Libman, [facility]  Why was Eliquis prescribed for you? Eliquis was prescribed to treat blood clots that may have been found in the veins of your legs (deep vein thrombosis) or in your lungs (pulmonary embolism) and to reduce the risk of them occurring again.  What do You need to know about Eliquis ? The starting dose is 10 mg (two 5 mg tablets) taken TWICE daily for the FIRST SEVEN (7) DAYS, then on 7/13  the dose is reduced to ONE 5 mg tablet taken TWICE daily.  Eliquis may be taken with or without food.   Try to take the dose about the same time in the morning and in the evening. If you have difficulty swallowing the tablet whole please discuss with your pharmacist how to take the medication safely.  Take Eliquis exactly as prescribed and DO NOT stop taking Eliquis without talking to the doctor who prescribed the medication.  Stopping may increase your risk of developing a new blood clot.  Refill your prescription before you run out.  After discharge, you should have regular check-up appointments with your healthcare provider that is prescribing your Eliquis.    What do you do if you miss a dose? If a dose of ELIQUIS is not taken at the scheduled time, take it as soon as possible on the same day and twice-daily administration should be resumed. The dose should not be doubled to make up for a missed dose.  Important Safety Information A possible side effect of Eliquis is bleeding. You should call your healthcare provider right away if you experience any of the following: ? Bleeding from an injury or your nose that does not stop. ? Unusual colored urine (red or dark brown) or unusual colored stools (red or black). ? Unusual bruising for unknown  reasons. ? A serious fall or if you hit your head (even if there is no bleeding).  Some medicines may interact with Eliquis and might increase your risk of bleeding or clotting while on Eliquis. To help avoid this, consult your healthcare provider or pharmacist prior to using any new prescription or non-prescription medications, including herbals, vitamins, non-steroidal anti-inflammatory drugs (NSAIDs) and supplements.  This website has more information on Eliquis (apixaban): http://www.eliquis.com/eliquis/home

## 2017-05-17 NOTE — Progress Notes (Signed)
ANTICOAGULATION CONSULT NOTE - Initial Consult  Pharmacy Consult for Apixaban  Indication: DVT  Allergies  Allergen Reactions  . Latex Itching, Rash and Other (See Comments)    Blisters for months on legs  . Mold Extract [Trichophyton] Anaphylaxis, Swelling and Rash  . Vancomycin Other (See Comments)    05/01/17: Pt noted to c/o redness and burning to R arm during IV Vancomycin 1g infusion. Infusion stopped and diphenhydramine 25mg  IV given.     Patient Measurements: Height: 4\' 9"  (144.8 cm) Weight: 164 lb 7.4 oz (74.6 kg) IBW/kg (Calculated) : 38.6  Vital Signs: Temp: 97.3 F (36.3 C) (07/06 0722) Temp Source: Oral (07/06 0722) BP: 85/53 (07/06 1000) Pulse Rate: 86 (07/06 1000)  Labs:  Recent Labs  05/14/17 1107  05/14/17 1821 05/15/17 0144  05/15/17 1420  05/16/17 0200 05/16/17 0335 05/16/17 1621 05/17/17 0400 05/17/17 0450  HGB  --   < > 10.1* 8.9*  < > 9.5*  --   --  8.7*  --   --  8.1*  HCT  --   < > 33.1* 29.1*  < > 31.4*  --   --  29.0*  --   --  27.3*  PLT  --   < > 314 264  < > 257  --   --  202  --   --  184  APTT 29  --   --   --   --   --   --   --   --   --   --   --   HEPARINUNFRC  --   < > 0.63 0.17*  < >  --   < > 0.20*  --  >2.20* 0.41  --   CREATININE  --   < > 0.86 0.86  --   --   --   --   --   --   --  0.69  < > = values in this interval not displayed.  Estimated Creatinine Clearance: 50.8 mL/min (by C-G formula based on SCr of 0.69 mg/dL).   Medical History: Past Medical History:  Diagnosis Date  . Allergy   . Anemia   . Arthritis    left knee  . Back pain   . Bipolar disorder ([place])   . Chronic headaches   . Degenerative disorder of bone   . Depression   . Esophageal ulcer    history  . GERD (gastroesophageal reflux disease)   . Hyperlipidemia   . Peripheral vascular disease ([place])   . Pulmonary embolism ([place])   . Scoliosis     Medications:  . sodium chloride 125 mL/hr at 05/16/17 2300  . heparin 1,100 Units/hr (05/17/17  0000)    Assessment: 76 yo female presents with occluded IVC filter and extensive DVT. Now s/p retrieval of IVC filter, thrombectomy/lysis with aleplase, and bilateral common/external iliac and femoral veins stenting.   On heparin 1100 Units/hr, HL is therapeutic today at 0.41. Hgb downtrending at 8.1 and platelets within normal limits. No overt s/s bleeding noted. Consulted to transition therapy to apixaban.    Goal of Therapy:   Reduction of stroke and PE Resolution of DVT Appropriate anticoagulation   Plan:  Discontinue Heparin Initiate apixaban 10 mg BID x 7 days, then 5 mg BID Monitor for S/Sx of bleeding  Leroy Libman, PharmD Pharmacy Resident 05/17/2017,10:13 AM

## 2017-05-17 NOTE — Care Management Note (Signed)
Case Management Note  Patient Details  Name: [name] May MRN: [id number] Date of Birth: [birth year]  Subjective/Objective:                 Spoke with patient and daughter at bedside. Patient lives at home alone, is independent. Has walker, rolator, bars in shower. No HH needs identified.    Action/Plan:   Expected Discharge Date:                  Expected Discharge Plan:  Home/Self Care  In-House Referral:  NA  Discharge planning Services  CM Consult  Post Acute Care Choice:  Home Health Choice offered to:  Patient  DME Arranged:    DME Agency:     HH Arranged:    [place] Agency:     Status of Service:  In process, will continue to follow  If discussed at Long Length of Stay Meetings, dates discussed:    Additional Comments:  Carles Collet, RN 05/17/2017, 3:34 PM

## 2017-05-17 NOTE — Progress Notes (Addendum)
Cost check sent in for Eliquis. CC'd to weekend coverage.  From previous admission in June of this year "Have checked for copays for possible anticoagulates at d/c. With help of CMA pt copay for both Xarelto and Elequis would be $47 for a month supply." Patient has been [place] on Lovenox in the recentpast and this was not covered by insurance.  Patient provided with 30 day Eliquis card.

## 2017-05-17 NOTE — Progress Notes (Signed)
    Subjective  - POD #1  Feels ok this am Some tingling in her feet   Physical Exam:  ACE wraps in place Palpable pedal pulses Incisions clean and dry       Assessment/Plan:  POD #1  Transition off heparin to Eilquis Re- wrap ACE wraps OOB and mobilize Transfer to floor Anticipate d/c tomorrow F/u 4-6 weeks with venous duplex Will need compression stockings at d/c  Justis Dupas, [place] 05/17/2017 9:24 AM --  Vitals:   05/17/17 0722 05/17/17 0800  BP: 115/63 98/61  Pulse: 78 78  Resp: 10 12  Temp: (!) 97.3 F (36.3 C)     Intake/Output Summary (Last 24 hours) at 05/17/17 0924 Last data filed at 05/17/17 0800  Gross per 24 hour  Intake             3053 ml  Output             4780 ml  Net            -1727 ml     Laboratory CBC    Component Value Date/Time   WBC 6.2 05/17/2017 0450   HGB 8.1 (L) 05/17/2017 0450   HCT 27.3 (L) 05/17/2017 0450   PLT 184 05/17/2017 0450    BMET    Component Value Date/Time   NA 137 05/17/2017 0450   K 3.8 05/17/2017 0450   CL 106 05/17/2017 0450   CO2 28 05/17/2017 0450   GLUCOSE 132 (H) 05/17/2017 0450   BUN 8 05/17/2017 0450   CREATININE 0.69 05/17/2017 0450   CALCIUM 8.0 (L) 05/17/2017 0450   GFRNONAA >60 05/17/2017 0450   GFRAA >60 05/17/2017 0450    COAG Lab Results  Component Value Date   INR 1.14 05/02/2017   INR 2.94 (H) 05/26/2015   INR 2.08 (H) 02/15/2015   No results found for: PTT  Antibiotics Anti-infectives    Start     Dose/Rate Route Frequency Ordered Stop   05/16/17 1045  cefUROXime (ZINACEF) 1.5 g in dextrose 5 % 50 mL IVPB  Status:  Discontinued     1.5 g 100 mL/hr over 30 Minutes Intravenous  Once 05/16/17 1041 05/16/17 1717       V. Leia Alf, M.D. [facility] [place] Office: [phone] Pager:  [phone]

## 2017-05-17 NOTE — Progress Notes (Addendum)
ANTICOAGULATION CONSULT NOTE - Follow Up Consult  Pharmacy Consult for Heparin Indication: DVT  Patient Measurements: Height: 4\' 9"  (144.8 cm) Weight: 158 lb (71.7 kg) IBW/kg (Calculated) : 38.6  Vital Signs: Temp: 97.9 F (36.6 C) (07/06 0352) Temp Source: Oral (07/06 0352) BP: 101/53 (07/06 0500) Pulse Rate: 77 (07/06 0500)  Labs:  Recent Labs  05/14/17 1107  05/14/17 1821 05/15/17 0144  05/15/17 1420  05/16/17 0200 05/16/17 0335 05/16/17 1621 05/17/17 0400 05/17/17 0450  HGB  --   < > 10.1* 8.9*  < > 9.5*  --   --  8.7*  --   --  8.1*  HCT  --   < > 33.1* 29.1*  < > 31.4*  --   --  29.0*  --   --  27.3*  PLT  --   < > 314 264  < > 257  --   --  202  --   --  184  APTT 29  --   --   --   --   --   --   --   --   --   --   --   HEPARINUNFRC  --   < > 0.63 0.17*  < >  --   < > 0.20*  --  >2.20* 0.41  --   CREATININE  --   < > 0.86 0.86  --   --   --   --   --   --   --  0.69  < > = values in this interval not displayed.  Estimated Creatinine Clearance: 49.7 mL/min (by C-G formula based on SCr of 0.69 mg/dL).  Assessment: 76 y/o F on heparin drip for DVT. Patient is s/p retrieval of IVC filter, thrombectomy/lysis, and bilateral common/external iliac and femoral veins stenting. Heparin was held for the above procedures and resumed at [facility] on 7/5.   Heparin level this morning is therapeutic at 0.41. Hgb downtrending at 8.1 and platelets within normal limits. No overt s/s bleeding noted.   Goal of Therapy:  Heparin level 0.3-0.7 units/mL Monitor platelets by anticoagulation protocol: Yes   Plan:  Continue heparin gtt at 1100 units/hr Confirmatory heparin level in 8 hours Daily heparin level and CBC Monitor for s/s bleeding   Argie Ramming, PharmD Clinical Pharmacist 05/17/17 5:30 AM

## 2017-05-18 LAB — CBC
HEMATOCRIT: 26.1 % — AB (ref 36.0–46.0)
Hemoglobin: 7.9 g/dL — ABNORMAL LOW (ref 12.0–15.0)
MCH: 30.5 pg (ref 26.0–34.0)
MCHC: 30.3 g/dL (ref 30.0–36.0)
MCV: 100.8 fL — AB (ref 78.0–100.0)
Platelets: 168 10*3/uL (ref 150–400)
RBC: 2.59 MIL/uL — ABNORMAL LOW (ref 3.87–5.11)
RDW: 14 % (ref 11.5–15.5)
WBC: 6 10*3/uL (ref 4.0–10.5)

## 2017-05-18 LAB — PREPARE RBC (CROSSMATCH)

## 2017-05-18 MED ORDER — SODIUM CHLORIDE 0.9 % IV SOLN
Freq: Once | INTRAVENOUS | Status: AC
  Administered 2017-05-18: 14:00:00 via INTRAVENOUS

## 2017-05-18 NOTE — Progress Notes (Addendum)
[facility] [place] over all.  Not sure if she is ready to be discharged.   Objective 127/61 93 98.4 F (36.9 C) (Oral) 18 93%  Intake/Output Summary (Last 24 hours) at 05/18/17 0738 Last data filed at 05/18/17 0532  Gross per 24 hour  Intake             1182 ml  Output             1240 ml  Net              -58 ml    Ace wraps in place B LE to the thighs. Active range of motion B feet, sensation intact B Neck and groin stick site with minimal ecchymosis   Assessment/Planning: POD # 2  Procedure:    1. US guided cannulation of bilateral internal jugular veins                         2. Venogram of ivc                         3. Mechanical thrombectomy of ivc and left common iliac vein with angiovac                         4. IVC filter retrieval                         5. IVUS of ivc, bilateral common, bilateral external iliac veins and bilateral common femoral veins                         6. Stent of bilateral common iliac and external iliac veins with 18 x 90 wallstent                         7. Stent of bilateral common femoral veins with 16 x90 wallstent HGB 7.9 post surgical anemia Cr WNL Removal of butris stiches all 4 stick sites.  Patient tolerated this well. Eliquis started today We will need a transfuse 2 units PRBC.  Possible discharge tomorrow   Laurence Slate [facility] 05/18/2017 7:38 AM -- Agree with above.  Discussed with pt risks benefits of transfusion.  She would like to be transfused prior to d/c  2U RBC today for blood loss anemia Eliquis daily D/c home tomorrow  Ruta Hinds, MD [facility] [place]: [phone] Pager: [phone]  Laboratory Lab Results:  Recent Labs  05/17/17 0450 05/18/17 0139  WBC 6.2 6.0  HGB 8.1* 7.9*  HCT 27.3* 26.1*  PLT 184 168   BMET  Recent Labs  05/17/17 0450  NA 137  K 3.8  CL 106  CO2 28  GLUCOSE 132*   BUN 8  CREATININE 0.69  CALCIUM 8.0*    COAG Lab Results  Component Value Date   INR 1.14 05/02/2017   INR 2.94 (H) 05/26/2015   INR 2.08 (H) 02/15/2015   No results found for: PTT

## 2017-05-19 LAB — CBC
HCT: 31.9 % — ABNORMAL LOW (ref 36.0–46.0)
Hemoglobin: 10.1 g/dL — ABNORMAL LOW (ref 12.0–15.0)
MCH: 30.7 pg (ref 26.0–34.0)
MCHC: 32 g/dL (ref 30.0–36.0)
MCV: 96.1 fL (ref 78.0–100.0)
PLATELETS: 179 10*3/uL (ref 150–400)
RBC: 3.32 MIL/uL — ABNORMAL LOW (ref 3.87–5.11)
RDW: 15.3 % (ref 11.5–15.5)
WBC: 7.1 10*3/uL (ref 4.0–10.5)

## 2017-05-19 MED ORDER — OXYCODONE-ACETAMINOPHEN 5-325 MG PO TABS: 1.0000 | ORAL_TABLET | Freq: Four times a day (QID) | ORAL | 0 refills | Status: DC | PRN

## 2017-05-19 MED ORDER — APIXABAN 5 MG PO TABS: 5.0000 mg | ORAL_TABLET | Freq: Two times a day (BID) | ORAL | 11 refills | Status: DC

## 2017-05-19 MED ORDER — APIXABAN (ELIQUIS) EDUCATION KIT FOR DVT/PE PATIENTS: 1.0000 | PACK | Freq: Once | 0 refills | Status: AC

## 2017-05-19 NOTE — Progress Notes (Addendum)
[facility] [place]  Subjective  - Doing wonderful.  Feels much better since transfusion yesterday.   Objective 140/73 80 98.2 F (36.8 C) (Oral) 18 90%  Intake/Output Summary (Last 24 hours) at 05/19/17 0852 Last data filed at 05/19/17 0634  Gross per 24 hour  Intake              732 ml  Output             2350 ml  Net            -1618 ml    Removed ace wrap and re wrapped B LE toes to thigh B LE motor, sensation and palpable pedal pulses. Angio stick sites clean and dry  Assessment/Planning: POD # 3 Procedure:1. US guided cannulation of bilateral internal jugular veins 2. Venogram of ivc 3. Mechanical thrombectomy of ivc and left common iliac vein with angiovac 4. IVC filter retrieval 5. IVUS of ivc, bilateral common, bilateral external iliac veins and bilateral common femoral veins 6. Stent of bilateral common iliac and external iliac veins with 18 x 90 wallstent 7. Stent of bilateral common femoral veins with 16 x90 wallstent  HGB 10.1 post 2 units transfusion Disposition stable D/C home today on Eliquis Patient has latex allergy  And will look for compression hose on line.  In the mean time she will use ace wraps for compression. F/u 4-6 weeks with venous duplex  Laurence Slate [facility] 05/19/2017 8:52 AM -- Agree with above D/c home  Ruta Hinds, MD [facility] [place]: [phone] Pager: [phone]  Laboratory Lab Results:  Recent Labs  05/18/17 0139 05/19/17 0234  WBC 6.0 7.1  HGB 7.9* 10.1*  HCT 26.1* 31.9*  PLT 168 179   BMET  Recent Labs  05/17/17 0450  NA 137  K 3.8  CL 106  CO2 28  GLUCOSE 132*  BUN 8  CREATININE 0.69  CALCIUM 8.0*    COAG Lab Results  Component Value Date   INR 1.14 05/02/2017   INR 2.94 (H)  05/26/2015   INR 2.08 (H) 02/15/2015   No results found for: PTT

## 2017-05-19 NOTE — Care Management Note (Addendum)
Case Management Note Original note created Carles Collet  Patient Details  Name: [name] May MRN: [id number] Date of Birth: [birth year]  Subjective/Objective:                 Spoke with patient and daughter at bedside. Patient lives at home alone, is independent. Has walker, rolator, bars in shower. No HH needs identified.    Action/Plan:   Expected Discharge Date:  05/19/17               Expected Discharge Plan:  Home/Self Care  In-House Referral:  NA  Discharge planning Services  CM Consult  Post Acute Care Choice:  Home Health Choice offered to:  Patient  DME Arranged:    DME Agency:     HH Arranged:  PT HH Agency:  [place]  Status of Service:  Completed, signed off  If discussed at [place] of Stay Meetings, dates discussed:    Additional Comments: 05/19/2017 CM offered choice for HHPT - pt chose [facility] - agency contacted and referral accepted - informed of discharge discharge.  Pt still has Elqiuis Card free 30 day card and is aware of copay.  Pt uses [facility] in [place] in the [place] [employer] area - pharmacy informed CM that prescription can be filled today. Maryclare Labrador, RN 05/19/2017, 10:02 AM

## 2017-05-20 ENCOUNTER — Telehealth: Payer: Self-pay | Admitting: Surgery

## 2017-05-20 ENCOUNTER — Other Ambulatory Visit: Payer: Self-pay | Admitting: *Deleted

## 2017-05-20 DIAGNOSIS — I82419 Acute embolism and thrombosis of unspecified femoral vein: Secondary | ICD-10-CM

## 2017-05-20 DIAGNOSIS — I2699 Other pulmonary embolism without acute cor pulmonale: Secondary | ICD-10-CM

## 2017-05-20 LAB — TYPE AND SCREEN
ABO/RH(D): A POS
ANTIBODY SCREEN: NEGATIVE
UNIT DIVISION: 0
UNIT DIVISION: 0
Unit division: 0
Unit division: 0

## 2017-05-20 LAB — BPAM RBC
BLOOD PRODUCT EXPIRATION DATE: 201807192359
Blood Product Expiration Date: 201807192359
Blood Product Expiration Date: 201807192359
Blood Product Expiration Date: 201807212359
ISSUE DATE / TIME: 201807071225
ISSUE DATE / TIME: 201807071525
ISSUE DATE / TIME: 201807071816
UNIT TYPE AND RH: 6200
UNIT TYPE AND RH: 6200
Unit Type and Rh: 6200
Unit Type and Rh: 6200

## 2017-05-20 NOTE — Discharge Summary (Signed)
Vascular [facility] Vein Specialists Discharge Summary   Patient ID:  [name] May MRN: [id number] DOB/AGE: 75-Oct-1942 75 y.o.  Admit date: 05/14/2017 Discharge date: 05/19/2017 Date of Surgery: 05/14/2017 - 05/16/2017 Surgeon: Juliann Mule): Waynetta Sandy, MD Serafina Mitchell, MD  Admission Diagnosis: DVT (deep venous thrombosis) [facility]) [I82.409]  Discharge Diagnoses:  DVT (deep venous thrombosis) ([place]) [I82.409]  Secondary Diagnoses: Past Medical History:  Diagnosis Date  . Allergy   . Anemia   . Arthritis    left knee  . Back pain   . Bipolar disorder ([place])   . Chronic headaches   . Degenerative disorder of bone   . Depression   . Esophageal ulcer    history  . GERD (gastroesophageal reflux disease)   . Hyperlipidemia   . Peripheral vascular disease ([place])   . Pulmonary embolism ([place])   . Scoliosis     Procedure(s): VENOGRAM OF IVC ULTRASOUND GUIDED CANULATED LEFT & RIGHT INTERNAL JUGULAR RETRIEVAL INFERIOR VENA-CAVA FILTER PERCUTANEOUS VENOUS THROMBECTOMY AND LYSIS WITH INTRAVASCULAR ULTRASOUND (IVUS) MECHANICAL THROMBECTOMY OF IVC FILTER STENT OF BILATERAL COMMON AND EXTERNAL ILIAC VEIN WITH 18 X 90 WALL STENT STENT OF BILATERAL COMMON FEMORAL VEIN WITH 16 X 90 WALL STENT  Discharged Condition: good  HPI: [name] May is a 76 y.o. 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 She was then subsequently gone on blood thinners and was recently hospitalized with extensive bilateral lower extremity DVT. She underwent attempted intervention but could not get through from the popliteal on the left. She therefore underwent CT venogram now presents to discuss results today. She has persistent swelling of her bilateral lower extremities which is very disc, burning to her and she has heaviness of her bilateral legs. She cannot wear stockings given a latex allergy and her inability to put them on. She does not have ulceration of her bilateral legs nor  does she have cellulitis at this point.  Data: IMPRESSION: VASCULAR  There is extensive DVT throughout the iliac venous system and IVC to the level of the IVC filter. Above the filter, the IVC is patent. DVT extends into the left common femoral and femoral veins. Renal veins are also patent. See above comments for details. Hospital Course:  [name] May is a 76 y.o. female is S/P  Procedure(s): VENOGRAM OF IVC ULTRASOUND GUIDED CANULATED LEFT & RIGHT INTERNAL JUGULAR RETRIEVAL INFERIOR VENA-CAVA FILTER PERCUTANEOUS VENOUS THROMBECTOMY AND LYSIS WITH INTRAVASCULAR ULTRASOUND (IVUS) MECHANICAL THROMBECTOMY OF IVC FILTER STENT OF BILATERAL COMMON AND EXTERNAL ILIAC VEIN WITH 18 X 90 WALL STENT STENT OF BILATERAL COMMON FEMORAL VEIN WITH 16 X 11 WALL STENT  Treatment Team:  Waynetta Sandy, MD   Physical Exam:  ACE wraps in place Palpable pedal pulses Incisions clean and dry       Assessment/Plan:  POD #1  Transition off heparin to Eilquis Re- wrap ACE wraps OOB and mobilize Transfer to floor Anticipate d/c tomorrow F/u 4-6 weeks with venous duplex with Dr. Trula Slade Will need compression stockings at d/c.  She has a latex allergy and will look on line for non latex compression thigh highs.    Significant Diagnostic Studies: CBC Lab Results  Component Value Date   WBC 7.1 05/19/2017   HGB 10.1 (L) 05/19/2017   HCT 31.9 (L) 05/19/2017   MCV 96.1 05/19/2017   PLT 179 05/19/2017    BMET    Component Value Date/Time   NA 137 05/17/2017 0450   K 3.8 05/17/2017  0450   CL 106 05/17/2017 0450   CO2 28 05/17/2017 0450   GLUCOSE 132 (H) 05/17/2017 0450   BUN 8 05/17/2017 0450   CREATININE 0.69 05/17/2017 0450   CALCIUM 8.0 (L) 05/17/2017 0450   GFRNONAA >60 05/17/2017 0450   GFRAA >60 05/17/2017 0450   COAG Lab Results  Component Value Date   INR 1.14 05/02/2017   INR 2.94 (H) 05/26/2015   INR 2.08 (H) 02/15/2015     Disposition:   Discharge to :Home Discharge Instructions    Call MD for:  redness, tenderness, or signs of infection (pain, swelling, bleeding, redness, odor or green/yellow discharge around incision site)    Complete by:  As directed    Call MD for:  severe or increased pain, loss or decreased feeling  in affected limb(s)    Complete by:  As directed    Call MD for:  temperature >100.5    Complete by:  As directed    Discharge instructions    Complete by:  As directed    Gradually advance activity.  Wear compression during the day, use ace wrap until you find cotton compression hose. You may shower tonight.   Driving Restrictions    Complete by:  As directed    No driving for 1 week   Increase activity slowly    Complete by:  As directed    Walk with assistance use walker or cane as needed   Resume previous diet    Complete by:  As directed      Allergies as of 05/19/2017      Reactions   Latex Itching, Rash, Other (See Comments)   Blisters for months on legs   Mold Extract [trichophyton] Anaphylaxis, Swelling, Rash   Vancomycin Other (See Comments)   05/01/17: Pt noted to c/o redness and burning to R arm during IV Vancomycin 1g infusion. Infusion stopped and diphenhydramine 32m IV given.       Medication List    STOP taking these medications   enoxaparin 80 MG/0.8ML injection Commonly known as:  LOVENOX     TAKE these medications   apixaban 5 MG Tabs tablet Commonly known as:  ELIQUIS Take 1 tablet (5 mg total) by mouth 2 (two) times daily. Notes to patient:  Today through 05/23/2017 - loading dose of 163mtwice daily 05/24/2017 - lower dose to 32m71mwice daily  (*BID=twice daily)   ascorbic acid 250 MG tablet Commonly known as:  VITAMIN C Take 250 mg by mouth 2 (two) times daily. Notes to patient:  Last dose 05/15/2017 @ 9:00pm   buPROPion 300 MG 24 hr tablet Commonly known as:  WELLBUTRIN XL Take 300 mg by mouth daily.   CALCIUM + D PO Take 1 tablet by mouth daily. Notes  to patient:  No recent doses, please take as ordered   dicyclomine 20 MG tablet Commonly known as:  BENTYL Take 20 mg by mouth every 6 (six) hours as needed for spasms. Notes to patient:  No recent doses, please take as ordered.   gabapentin 600 MG tablet Commonly known as:  NEURONTIN Take 600 mg by mouth 3 (three) times daily. AT 1500, 1800, and 2100   hydrocortisone 2.5 % cream Apply 1 application topically daily as needed (eczema). Notes to patient:  No recent doses, please take as ordered.   lamoTRIgine 200 MG tablet Commonly known as:  LAMICTAL Take 200 mg by mouth at bedtime.   loperamide 2 MG tablet Commonly known as:  IMODIUM A-D Take 4-6 mg by mouth daily as needed for diarrhea or loose stools. Notes to patient:  No recent doses, please take as ordered.   loratadine 10 MG tablet Commonly known as:  CLARITIN Take 10 mg by mouth daily as needed (for seasonal allergies). Notes to patient:  No recent doses, please take as ordered.   LORazepam 0.5 MG tablet Commonly known as:  ATIVAN Take 0.5 mg by mouth 2 (two) times daily.   MAGNESIUM-OXIDE 400 (241.3 Mg) MG tablet Generic drug:  magnesium oxide Take 400 mg by mouth 2 (two) times daily.   methocarbamol 500 MG tablet Commonly known as:  ROBAXIN Take 1 tablet (500 mg total) by mouth 3 (three) times daily.   NU-IRON 150 MG capsule Generic drug:  iron polysaccharides Take 150 mg by mouth daily.   oxyCODONE-acetaminophen 5-325 MG tablet Commonly known as:  PERCOCET/ROXICET Take 1-2 tablets by mouth every 3 (three) hours as needed for moderate pain. What changed:  Another medication with the same name was added. Make sure you understand how and when to take each. Notes to patient:  Duplicate, please see order below   oxyCODONE-acetaminophen 5-325 MG tablet Commonly known as:  PERCOCET/ROXICET Take 1 tablet by mouth every 6 (six) hours as needed for moderate pain. What changed:  You were already taking a  medication with the same name, and this prescription was added. Make sure you understand how and when to take each. Notes to patient:  Last dose 05/18/2017 @ 11:00pm   pantoprazole 40 MG tablet Commonly known as:  PROTONIX Take 40 mg by mouth 2 (two) times daily as needed (acid reflux). Notes to patient:  No recent doses, please take as ordered.   rOPINIRole 2 MG tablet Commonly known as:  REQUIP Take 2 mg by mouth 3 (three) times daily. AT 1500, 1800, and 2100   rOPINIRole 1 MG tablet Commonly known as:  REQUIP Take 1 mg by mouth every morning.     ASK your doctor about these medications   apixaban Kit Commonly known as:  ELIQUIS 1 kit by Does not apply route once. Ask about: Should I take this medication?      Verbal and written Discharge instructions given to the patient. Wound care per Discharge AVS Follow-up Information    Serafina Mitchell, MD Follow up in 4 week(s).   Specialties:  Vascular Surgery, Cardiology Why:  office will call Contact information: [place] [address] [place] Follow up.   Why:  Physical Therapist Contact information: [address] [place] [address] [phone]           Signed: Laurence Slate [facility] 05/20/2017, 11:05 AM

## 2017-05-20 NOTE — Telephone Encounter (Signed)
Sched appt 06/17/17; lab at 8:00 and MD at 8:30. Lm on hm#.

## 2017-05-20 NOTE — Telephone Encounter (Signed)
-----   Message from Mena Goes, RN sent at 05/20/2017  9:41 AM EDT ----- Regarding: 4-6 weeks    Duplex order done   ----- Message ----- From: Ulyses Amor, PA-C Sent: 05/19/2017   9:17 AM To: Vvs Charge Pool  F/u 4-6 weeks with venous duplex s/p iliac stents, and removal IVC filter Dr. Trula Slade

## 2017-05-23 ENCOUNTER — Encounter (HOSPITAL_COMMUNITY): Payer: Self-pay | Admitting: Vascular Surgery

## 2017-06-17 ENCOUNTER — Encounter (HOSPITAL_COMMUNITY): Payer: Medicare HMO

## 2017-06-17 ENCOUNTER — Encounter: Payer: Medicare HMO | Admitting: Surgery

## 2017-06-19 ENCOUNTER — Encounter: Payer: Self-pay | Admitting: Surgery

## 2017-06-24 ENCOUNTER — Encounter: Payer: Medicare HMO | Admitting: Surgery

## 2017-06-27 ENCOUNTER — Ambulatory Visit (HOSPITAL_COMMUNITY)
Admission: RE | Admit: 2017-06-27 | Discharge: 2017-06-27 | Disposition: A | Discharge status: Home / Self Care | Payer: Medicare HMO | Source: Ambulatory Visit | Attending: Surgery | Admitting: Surgery

## 2017-06-27 DIAGNOSIS — I82419 Acute embolism and thrombosis of unspecified femoral vein: Secondary | ICD-10-CM | POA: Insufficient documentation

## 2017-06-27 DIAGNOSIS — I2699 Other pulmonary embolism without acute cor pulmonale: Secondary | ICD-10-CM | POA: Diagnosis present

## 2017-07-01 ENCOUNTER — Ambulatory Visit (INDEPENDENT_AMBULATORY_CARE_PROVIDER_SITE_OTHER): Payer: Medicare HMO | Admitting: Surgery

## 2017-07-01 VITALS — BP 134/85 | HR 93 | Ht <= 58 in | Wt 143.5 lb

## 2017-07-01 DIAGNOSIS — I82423 Acute embolism and thrombosis of iliac vein, bilateral: Secondary | ICD-10-CM | POA: Diagnosis not present

## 2017-07-01 NOTE — Progress Notes (Signed)
Vascular and Vein Specialist of [facility]  Patient name: [name] May MRN: [id number] DOB: [birth year] Sex: female   REASON FOR VISIT:    Follow up DVT  HISOTRY OF PRESENT ILLNESS:    [name] May is a 76 y.o. female who returns today for follow-up.  She initially presented in June 2018 with left leg erythema and pain.  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  She has a history of an IVC filter placed because she could not tolerate anticoagulation secondary to a GI bleed.  During her hospitalization we found that her IVC filter was occluded.  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  She underwent AngioJet mechanical thrombectomy as well as IVC filter removal with the angiovac device.  She subsequently had stenting of bilateral common iliac, external iliac, and common femoral vein stenting.  She is back today for follow-up.  She is very pleased at how her legs are feeling.  The right side is back to normal and the left has only minimal swelling.  She is tolerating her anticoagulation.  She does not have any open wounds.   PAST MEDICAL HISTORY:   Past Medical History:  Diagnosis Date  . Allergy   . Anemia   . Arthritis    left knee  . Back pain   . Bipolar disorder ([place])   . Chronic headaches   . Degenerative disorder of bone   . Depression   . Esophageal ulcer    history  . GERD (gastroesophageal reflux disease)   . Hyperlipidemia   . Peripheral vascular disease ([place])   . Pulmonary embolism ([place])   . Scoliosis      FAMILY HISTORY:   Family History  Problem Relation Age of Onset  . Deep vein thrombosis Neg Hx     SOCIAL HISTORY:   Social History  Substance Use Topics  . Smoking status: Former Research scientist (life sciences)  . Smokeless tobacco: Never Used     Comment: quit 25 years ago   . Alcohol use No     ALLERGIES:   Allergies  Allergen Reactions  . Latex Itching, Rash and Other (See Comments)   Blisters for months on legs  . Mold Extract [Trichophyton] Anaphylaxis, Swelling and Rash  . Vancomycin Other (See Comments)    05/01/17: Pt noted to c/o redness and burning to R arm during IV Vancomycin 1g infusion. Infusion stopped and diphenhydramine 25mg  IV given.      CURRENT MEDICATIONS:   Current Outpatient Prescriptions  Medication Sig Dispense Refill  . apixaban (ELIQUIS) 5 MG TABS tablet Take 1 tablet (5 mg total) by mouth 2 (two) times daily. 60 tablet 11  . ascorbic acid (VITAMIN C) 250 MG tablet Take 250 mg by mouth 2 (two) times daily.    Marland Kitchen buPROPion (WELLBUTRIN XL) 300 MG 24 hr tablet Take 300 mg by mouth daily.     . Calcium Carbonate-Vitamin D (CALCIUM + D PO) Take 1 tablet by mouth daily.     Marland Kitchen dicyclomine (BENTYL) 20 MG tablet Take 20 mg by mouth every 6 (six) hours as needed for spasms.    Marland Kitchen gabapentin (NEURONTIN) 600 MG tablet Take 600 mg by mouth 3 (three) times daily. AT 1500, 1800, and 2100    . hydrocortisone 2.5 % cream Apply 1 application topically daily as needed (eczema).     . iron polysaccharides (NU-IRON) 150 MG capsule Take 150 mg by mouth daily.    Marland Kitchen lamoTRIgine (LAMICTAL)  200 MG tablet Take 200 mg by mouth at bedtime.     Marland Kitchen loperamide (IMODIUM A-D) 2 MG tablet Take 4-6 mg by mouth daily as needed for diarrhea or loose stools.    Marland Kitchen loratadine (CLARITIN) 10 MG tablet Take 10 mg by mouth daily as needed (for seasonal allergies).     . LORazepam (ATIVAN) 0.5 MG tablet Take 0.5 mg by mouth 2 (two) times daily.    Marland Kitchen MAGNESIUM-OXIDE 400 (241.3 Mg) MG tablet Take 400 mg by mouth 2 (two) times daily.  0  . methocarbamol (ROBAXIN) 500 MG tablet Take 1 tablet (500 mg total) by mouth 3 (three) times daily. 30 tablet 0  . oxyCODONE-acetaminophen (PERCOCET/ROXICET) 5-325 MG tablet Take 1-2 tablets by mouth every 3 (three) hours as needed for moderate pain. 20 tablet 0  . pantoprazole (PROTONIX) 40 MG tablet Take 40 mg by mouth 2 (two) times daily as needed (acid reflux).      Marland Kitchen rOPINIRole (REQUIP) 1 MG tablet Take 1 mg by mouth every morning.   1  . rOPINIRole (REQUIP) 2 MG tablet Take 2 mg by mouth 3 (three) times daily. AT 1500, 1800, and 2100     No current facility-administered medications for this visit.     REVIEW OF SYSTEMS:   [X]  denotes positive finding, [ ]  denotes negative finding Cardiac  Comments:  Chest pain or chest pressure:    Shortness of breath upon exertion:    Short of breath when lying flat:    Irregular heart rhythm:        Vascular    Pain in calf, thigh, or hip brought on by ambulation:    Pain in feet at night that wakes you up from your sleep:     Blood clot in your veins: x   Leg swelling:         Pulmonary    Oxygen at home:    Productive cough:     Wheezing:         Neurologic    Sudden weakness in arms or legs:     Sudden numbness in arms or legs:     Sudden onset of difficulty speaking or slurred speech:    Temporary loss of vision in one eye:     Problems with dizziness:         Gastrointestinal    Blood in stool:     Vomited blood:         Genitourinary    Burning when urinating:     Blood in urine:        Psychiatric    Major depression:         Hematologic    Bleeding problems:    Problems with blood clotting too easily:        Skin    Rashes or ulcers:        Constitutional    Fever or chills:      PHYSICAL EXAM:   Vitals:   07/01/17 0845  BP: 134/85  Pulse: 93  SpO2: 96%  Weight: 143 lb 8 oz (65.1 kg)  Height: 4\' 9"  (1.448 m)    GENERAL: The patient is a well-nourished female, in no acute distress. The vital signs are documented above. CARDIAC: There is a regular rate and rhythm.  VASCULAR: No right lower extremity edema.  1+ left leg edema PULMONARY: Non-labored respirations MUSCULOSKELETAL: There are no major deformities or cyanosis. NEUROLOGIC: No focal weakness or paresthesias are detected. SKIN: There  are no ulcers or rashes noted. PSYCHIATRIC: The patient has a normal  affect.  STUDIES:   I have reviewed her ultrasound which shows occluded left iliac vein stent.  The right side is widely patent  MEDICAL ISSUES:   I have discussed the ultrasound findings with the patient.  Currently, she has significantly improved left leg.  She is able to tolerate the amount of swelling she has.  She is trying to keep her legs elevated and wear compression stockings.  I told her that the iliac stents on the left likely occluded because of the residual femoral-popliteal thrombus.  Our options would include going back from the popliteal vein on the left and trying to recanalize the femoral popliteal veins and then opened up her stents.  The other option would be to continue with anticoagulation.  At this point, the patient is comfortable with her level of discomfort and swelling.    [name], MD Vascular and Vein Specialists of [facility] [phone] Pager [phone]

## 2017-07-02 NOTE — Addendum Note (Signed)
Addended by: Lianne Cure A on: 07/02/2017 12:35 PM   Modules accepted: Orders

## 2017-10-07 ENCOUNTER — Ambulatory Visit (INDEPENDENT_AMBULATORY_CARE_PROVIDER_SITE_OTHER)
Admission: RE | Admit: 2017-10-07 | Discharge: 2017-10-07 | Disposition: A | Discharge status: Home / Self Care | Payer: Medicare HMO | Source: Ambulatory Visit | Attending: Surgery | Admitting: Surgery

## 2017-10-07 ENCOUNTER — Encounter: Payer: Self-pay | Admitting: Surgery

## 2017-10-07 ENCOUNTER — Ambulatory Visit (HOSPITAL_COMMUNITY)
Admission: RE | Admit: 2017-10-07 | Discharge: 2017-10-07 | Disposition: A | Discharge status: Home / Self Care | Payer: Medicare HMO | Source: Ambulatory Visit | Attending: Surgery | Admitting: Surgery

## 2017-10-07 ENCOUNTER — Ambulatory Visit (INDEPENDENT_AMBULATORY_CARE_PROVIDER_SITE_OTHER): Payer: Medicare HMO | Admitting: Surgery

## 2017-10-07 ENCOUNTER — Other Ambulatory Visit: Payer: Self-pay

## 2017-10-07 VITALS — BP 122/79 | HR 85 | Temp 97.0°F | Resp 16 | Ht <= 58 in | Wt 140.0 lb

## 2017-10-07 DIAGNOSIS — I82423 Acute embolism and thrombosis of iliac vein, bilateral: Secondary | ICD-10-CM

## 2017-10-07 NOTE — Progress Notes (Signed)
Vascular and Vein Specialist of [facility]  Patient name: [name] May MRN: [id number] DOB: [birth year] Sex: female   REASON FOR VISIT:    Follow up  [place]:    [name] May is a 76 y.o. female who returns today for follow-up.  She initially presented in June 2018 with left leg erythema and pain.  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  She has a history of an IVC filter placed because she could not tolerate anticoagulation secondary to a GI bleed.  During her hospitalization we found that her IVC filter was occluded.  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  She underwent AngioJet mechanical thrombectomy as well as IVC filter removal with the angiovac device.  She subsequently had stenting of bilateral common iliac, external iliac, and common femoral vein stenting.  At her last visit, she was found to have an occluded iliac stent.  She was not symptomatic and we elected to manage this noninvasively.  She occasionally wears her compression stockings.  She does not report significant swelling in her left leg.  PAST MEDICAL HISTORY:   Past Medical History:  Diagnosis Date  . Allergy   . Anemia   . Arthritis    left knee  . Back pain   . Bipolar disorder ([place])   . Chronic headaches   . Degenerative disorder of bone   . Depression   . Esophageal ulcer    history  . GERD (gastroesophageal reflux disease)   . Hyperlipidemia   . Peripheral vascular disease ([place])   . Pulmonary embolism ([place])   . Scoliosis      FAMILY HISTORY:   Family History  Problem Relation Age of Onset  . Deep vein thrombosis Neg Hx     SOCIAL HISTORY:   Social History   Tobacco Use  . Smoking status: Former Research scientist (life sciences)  . Smokeless tobacco: Never Used  . Tobacco comment: quit 25 years ago   Substance Use Topics  . Alcohol use: No     ALLERGIES:   Allergies  Allergen Reactions  . Latex Itching, Rash  and Other (See Comments)    Blisters for months on legs  . Mold Extract [Trichophyton] Anaphylaxis, Swelling and Rash  . Vancomycin Other (See Comments)    05/01/17: Pt noted to c/o redness and burning to R arm during IV Vancomycin 1g infusion. Infusion stopped and diphenhydramine 25mg  IV given.      CURRENT MEDICATIONS:   Current Outpatient Medications  Medication Sig Dispense Refill  . apixaban (ELIQUIS) 5 MG TABS tablet Take 1 tablet (5 mg total) by mouth 2 (two) times daily. 60 tablet 11  . ascorbic acid (VITAMIN C) 250 MG tablet Take 250 mg by mouth 2 (two) times daily.    Marland Kitchen buPROPion (WELLBUTRIN XL) 300 MG 24 hr tablet Take 300 mg by mouth daily.     . Calcium Carbonate-Vitamin D (CALCIUM + D PO) Take 1 tablet by mouth daily.     Marland Kitchen dicyclomine (BENTYL) 20 MG tablet Take 20 mg by mouth every 6 (six) hours as needed for spasms.    Marland Kitchen gabapentin (NEURONTIN) 600 MG tablet Take 600 mg by mouth 3 (three) times daily. AT 1500, 1800, and 2100    . hydrocortisone 2.5 % cream Apply 1 application topically daily as needed (eczema).     . iron polysaccharides (NU-IRON) 150 MG capsule Take 150 mg by mouth daily.    Marland Kitchen lamoTRIgine (LAMICTAL)  200 MG tablet Take 200 mg by mouth at bedtime.     Marland Kitchen loperamide (IMODIUM A-D) 2 MG tablet Take 4-6 mg by mouth daily as needed for diarrhea or loose stools.    Marland Kitchen loratadine (CLARITIN) 10 MG tablet Take 10 mg by mouth daily as needed (for seasonal allergies).     . LORazepam (ATIVAN) 0.5 MG tablet Take 0.5 mg by mouth 2 (two) times daily.    Marland Kitchen MAGNESIUM-OXIDE 400 (241.3 Mg) MG tablet Take 400 mg by mouth 2 (two) times daily.  0  . methocarbamol (ROBAXIN) 500 MG tablet Take 1 tablet (500 mg total) by mouth 3 (three) times daily. 30 tablet 0  . pantoprazole (PROTONIX) 40 MG tablet Take 40 mg by mouth 2 (two) times daily as needed (acid reflux).     Marland Kitchen rOPINIRole (REQUIP) 1 MG tablet Take 1 mg by mouth every morning.   1  . rOPINIRole (REQUIP) 2 MG tablet Take 2 mg  by mouth 3 (three) times daily. AT 1500, 1800, and 2100    . cephALEXin (KEFLEX) 500 MG capsule Take 500 mg by mouth 4 (four) times daily.    Marland Kitchen oxyCODONE-acetaminophen (PERCOCET/ROXICET) 5-325 MG tablet Take 1-2 tablets by mouth every 3 (three) hours as needed for moderate pain. (Patient not taking: Reported on 10/07/2017) 20 tablet 0   No current facility-administered medications for this visit.     REVIEW OF SYSTEMS:   [X]  denotes positive finding, [ ]  denotes negative finding Cardiac  Comments:  Chest pain or chest pressure:    Shortness of breath upon exertion:    Short of breath when lying flat:    Irregular heart rhythm:        Vascular    Pain in calf, thigh, or hip brought on by ambulation:    Pain in feet at night that wakes you up from your sleep:     Blood clot in your veins:    Leg swelling:  x       Pulmonary    Oxygen at home:    Productive cough:     Wheezing:         Neurologic    Sudden weakness in arms or legs:     Sudden numbness in arms or legs:     Sudden onset of difficulty speaking or slurred speech:    Temporary loss of vision in one eye:     Problems with dizziness:         Gastrointestinal    Blood in stool:     Vomited blood:         Genitourinary    Burning when urinating:     Blood in urine:        Psychiatric    Major depression:         Hematologic    Bleeding problems:    Problems with blood clotting too easily:        Skin    Rashes or ulcers:        Constitutional    Fever or chills:      PHYSICAL EXAM:   Vitals:   10/07/17 1236  BP: 122/79  Pulse: 85  Resp: 16  Temp: (!) 97 F (36.1 C)  SpO2: 93%  Weight: 140 lb (63.5 kg)  Height: 4\' 9"  (1.448 m)    GENERAL: The patient is a well-nourished female, in no acute distress. The vital signs are documented above. CARDIAC: There is a regular rate and rhythm.  VASCULAR: Left leg edema PULMONARY: Non-labored respirations  MUSCULOSKELETAL: There are no major deformities  or cyanosis. NEUROLOGIC: No focal weakness or paresthesias are detected. SKIN: There are no ulcers or rashes noted. PSYCHIATRIC: The patient has a normal affect.  STUDIES:   I have reviewed her ultrasound which shows that her right iliac stents are patent.  The left side has occluded.  MEDICAL ISSUES:   The patient remains essentially asymptomatic from her iliac vein stent occlusion on the left.  Because of her lack of symptoms, I have not recommended any.  She will continue with anticoagulation and compression stockings will follow-up in 6 months with a duplex.    [name], MD Vascular and Vein Specialists of [facility] [phone] Pager [phone]

## 2017-10-08 NOTE — Addendum Note (Signed)
Addended by: Lianne Cure A on: 10/08/2017 04:35 PM   Modules accepted: Orders

## 2018-04-14 ENCOUNTER — Ambulatory Visit (HOSPITAL_COMMUNITY)
Admission: RE | Admit: 2018-04-14 | Discharge: 2018-04-14 | Disposition: A | Discharge status: Home / Self Care | Payer: Medicare Other | Source: Ambulatory Visit | Attending: Family | Admitting: Family

## 2018-04-14 ENCOUNTER — Encounter: Payer: Self-pay | Admitting: Family

## 2018-04-14 ENCOUNTER — Ambulatory Visit (INDEPENDENT_AMBULATORY_CARE_PROVIDER_SITE_OTHER): Payer: Medicare Other | Admitting: Family

## 2018-04-14 VITALS — BP 130/81 | HR 82 | Temp 98.6°F | Resp 16 | Ht <= 58 in | Wt 143.0 lb

## 2018-04-14 DIAGNOSIS — I82422 Acute embolism and thrombosis of left iliac vein: Secondary | ICD-10-CM

## 2018-04-14 DIAGNOSIS — Z86711 Personal history of pulmonary embolism: Secondary | ICD-10-CM | POA: Diagnosis not present

## 2018-04-14 DIAGNOSIS — I82423 Acute embolism and thrombosis of iliac vein, bilateral: Secondary | ICD-10-CM | POA: Insufficient documentation

## 2018-04-14 NOTE — Progress Notes (Signed)
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 female initially presented in June 2018 with left leg erythema and pain. 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 She has a history of an IVC filter placed because she could not tolerate anticoagulation secondary to a GI bleed.  During her hospitalization we found that her IVC filter was occluded. 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 She underwent AngioJet mechanical thrombectomy as well as IVC filter removal with the angiovacdevice. She subsequently had stenting of bilateral common iliac, external iliac, and common femoral vein stenting.  At her August 2018 visit, she was found to have an occluded left iliac stent.  She was not symptomatic and we elected to manage this noninvasively.  She has scoliosis, has had back surgery. Has continued pain from her back issues.  She occasionally wears her compression stockings.  She does not report significant swelling in her left leg. She elevates her legs twice daily, and has no swelling nor erythema of either leg.   Dr. Trula Slade last evaluated pt on 10-07-17. At that time ultrasound showed that her right iliac stents were patent.  The left side had occluded.The patient remained essentially asymptomatic from her iliac vein stent occlusion on the left.  Because of her lack of symptoms, Dr. Trula Slade did not recommended any intervention.  She was to continue with anticoagulation and compression stockings to follow-up in 6 months with a duplex.  She states that she has no problems walking.  She also states that she used to be 5'6", and is now 4'9". She also has osteoporosis.    Diabetic: No Tobacco use: former smoker, quit in 1992, started in her teens  Pt meds include: Statin :No Betablocker: No ASA: No Other anticoagulants/antiplatelets: Eliquis  Past Medical History:   Diagnosis Date  . Allergy   . Anemia   . Arthritis    left knee  . Back pain   . Bipolar disorder ([place])   . Chronic headaches   . Degenerative disorder of bone   . Depression   . Esophageal ulcer    history  . GERD (gastroesophageal reflux disease)   . Hyperlipidemia   . Peripheral vascular disease ([place])   . Pulmonary embolism ([place])   . Scoliosis     Social History Social History   Tobacco Use  . Smoking status: Former Research scientist (life sciences)  . Smokeless tobacco: Never Used  . Tobacco comment: quit 25 years ago   Substance Use Topics  . Alcohol use: No  . Drug use: No    Family History Family History  Problem Relation Age of Onset  . Deep vein thrombosis Neg Hx     Past Surgical History:  Procedure Laterality Date  . ABDOMINAL HYSTERECTOMY    . ANKLE SURGERY    . COLONOSCOPY    . EYE SURGERY     caratacs  . FEMORAL ARTERY EXPLORATION  05/16/2017   Procedure: STENT OF BILATERAL COMMON FEMORAL VEIN WITH 16 X 90 WALL STENT;  Surgeon: Waynetta Sandy, MD;  Location: [place];  Service: Vascular;;  . hemroidectomy    . INSERTION OF ILIAC STENT Bilateral 05/16/2017   Procedure: STENT OF BILATERAL COMMON AND EXTERNAL ILIAC VEIN WITH 18 X 90 WALL STENT;  Surgeon: Waynetta Sandy, MD;  Location: [facility];  Service: Vascular;  Laterality: Bilateral;  . INTRAVASCULAR ULTRASOUND/IVUS N/A  05/06/2017   Procedure: INTRAVASCULAR ULTRASOUND/IVUS;  Surgeon: Waynetta Sandy, MD;  Location: [facility];  Service: Vascular;  Laterality: N/A;  . IVC FILTER INSERTION N/A 05/16/2017   Procedure: MECHANICAL THROMBECTOMY OF IVC FILTER;  Surgeon: Waynetta Sandy, MD;  Location: [facility];  Service: Vascular;  Laterality: N/A;  . LAMINECTOMY    . LOWER EXTREMITY VENOGRAPHY N/A 05/14/2017   Procedure: Lower Extremity Venography;  Surgeon: Serafina Mitchell, MD;  Location: [facility];  Service: Cardiovascular;  Laterality: N/A;  . NASAL SINUS SURGERY    . PERCUTANEOUS VENOUS  THROMBECTOMY,LYSIS WITH INTRAVASCULAR ULTRASOUND (IVUS) N/A 05/16/2017   Procedure: PERCUTANEOUS VENOUS THROMBECTOMY AND LYSIS WITH INTRAVASCULAR ULTRASOUND (IVUS);  Surgeon: Waynetta Sandy, MD;  Location: [facility];  Service: Vascular;  Laterality: N/A;  . [place] N/A 05/16/2017   Procedure: RETRIEVAL INFERIOR VENA-CAVA FILTER;  Surgeon: Waynetta Sandy, MD;  Location: [facility];  Service: Vascular;  Laterality: N/A;  . VENOGRAM Bilateral 05/16/2017   Procedure: VENOGRAM OF IVC;  Surgeon: Waynetta Sandy, MD;  Location: [facility];  Service: Vascular;  Laterality: Bilateral;    Allergies  Allergen Reactions  . Latex Itching, Rash and Other (See Comments)    Blisters for months on legs  . Mold Extract [Trichophyton] Anaphylaxis, Swelling and Rash  . Molds & Smuts Anaphylaxis, Rash and Swelling  . Vancomycin Other (See Comments)    05/01/17: Pt noted to c/o redness and burning to R arm during IV Vancomycin 1g infusion. Infusion stopped and diphenhydramine 25mg  IV given.   . Cephalexin Rash    Current Outpatient Medications  Medication Sig Dispense Refill  . apixaban (ELIQUIS) 5 MG TABS tablet Take 1 tablet (5 mg total) by mouth 2 (two) times daily. 60 tablet 11  . ascorbic acid (VITAMIN C) 250 MG tablet Take 250 mg by mouth 2 (two) times daily.    Marland Kitchen buPROPion (WELLBUTRIN XL) 300 MG 24 hr tablet Take 300 mg by mouth daily.     . Calcium Carbonate-Vitamin D (CALCIUM + D PO) Take 1 tablet by mouth daily.     Marland Kitchen dicyclomine (BENTYL) 20 MG tablet Take 20 mg by mouth every 6 (six) hours as needed for spasms.    Marland Kitchen gabapentin (NEURONTIN) 600 MG tablet Take 600 mg by mouth 3 (three) times daily. AT 1500, 1800, and 2100    . hydrocortisone 2.5 % cream Apply 1 application topically daily as needed (eczema).     . iron polysaccharides (NU-IRON) 150 MG capsule Take 150 mg by mouth daily.    Marland Kitchen lamoTRIgine (LAMICTAL) 200 MG tablet Take 200 mg by mouth at bedtime.     Marland Kitchen  loperamide (IMODIUM A-D) 2 MG tablet Take 4-6 mg by mouth daily as needed for diarrhea or loose stools.    Marland Kitchen loratadine (CLARITIN) 10 MG tablet Take 10 mg by mouth daily as needed (for seasonal allergies).     . LORazepam (ATIVAN) 0.5 MG tablet Take 0.5 mg by mouth 2 (two) times daily.    Marland Kitchen MAGNESIUM-OXIDE 400 (241.3 Mg) MG tablet Take 400 mg by mouth 2 (two) times daily.  0  . methocarbamol (ROBAXIN) 500 MG tablet Take 1 tablet (500 mg total) by mouth 3 (three) times daily. 30 tablet 0  . pantoprazole (PROTONIX) 40 MG tablet Take 40 mg by mouth 2 (two) times daily as needed (acid reflux).     Marland Kitchen rOPINIRole (REQUIP) 2 MG tablet Take 2 mg by mouth 3 (three)  times daily. AT 1500, 1800, and 2100    . SUMAtriptan (IMITREX) 25 MG tablet Take by mouth every 2 (two) hours as needed.     . traMADol (ULTRAM) 50 MG tablet Take 50 mg by mouth 2 (two) times daily as needed.     Marland Kitchen oxyCODONE-acetaminophen (PERCOCET/ROXICET) 5-325 MG tablet Take 1-2 tablets by mouth every 3 (three) hours as needed for moderate pain. (Patient not taking: Reported on 10/07/2017) 20 tablet 0   No current facility-administered medications for this visit.     ROS: See HPI for pertinent positives and negatives.   Physical Examination  Vitals:   04/14/18 1008 04/14/18 1015  BP: 135/90 130/81  Pulse: 82   Resp: 16   Temp: 98.6 F (37 C)   TempSrc: Oral   SpO2: 95%   Weight: 143 lb (64.9 kg)   Height: 4\' 9"  (1.448 m)    Body mass index is 30.94 kg/m.  General: A&O x 3, WDWN, obese female. Gait: slow steady HENT: No gross abnormalities.  Eyes: PERRLA. Pulmonary: Respirations are non labored, CTAB, fair air movement in all fields.  Cardiac: regular rhythm, no detected murmur.         Carotid Bruits Right Left   Negative Negative   Radial pulses are 2+ palpable bilaterally   Adominal aortic pulse is not palpable                         VASCULAR EXAM: Extremities without ischemic changes, without Gangrene;  without open wounds.                                                                                                          LE Pulses Right Left       FEMORAL  2+ palpable  2+ palpable        POPLITEAL  not palpable   not palpable       POSTERIOR TIBIAL  not palpable   not palpable        DORSALIS PEDIS      ANTERIOR TIBIAL not palpable  not palpable    Abdomen: soft, NT, no palpable masses. Skin: no rashes, no cellulitis, no ulcers noted. Musculoskeletal: no muscle wasting or atrophy. significant scoliosis.   Neurologic: A&O X 3; appropriate affect, Sensation is normal; MOTOR FUNCTION:  moving all extremities equally, motor strength 5/5 throughout. Speech is fluent/normal. CN 2-12 intact. Psychiatric: Thought content is normal, mood appropriate for clinical situation.     ASSESSMENT: [name] May is a 77 y.o. female who is s/p AngioJet mechanical thrombectomy as well as IVC filter removal with the angiovacdevice. She subsequently had stenting of bilateral common iliac, external iliac, and common femoral vein stenting on 05-16-17.   At her August 2018 visit, she was found to have an occluded left iliac stent.  She was not symptomatic and we elected to manage this noninvasively.  She has no swelling, pain, nor erythema in her legs.  She remains on Eliquis.   DATA  IVC Iliac veins Duplex (04/14/18):  Patent distal IVC. Patent right common iliac, external iliac, and common femoral veins. No flow is identified in the left common iliac or external iliac veins. The left common femoral and femoral veins are partially compressible, with flow identified within, consistent with recanalization, chronic thrombus.  There are two collateral branches seen in the proximal common femoral vein.  Partial recanalization of the  left common femoral and femoral veins compared to the exam on 10-07-17.    PLAN:  Based on the patient's vascular studies and examination, and after discussing with  Dr. Trula Slade, pt will return to clinic in 1 year with bilateral IVC iliac duplex.  I advised her to notify us if she develops swelling or redness in either leg, or any concerns re the circulation in her feet or legs.  Continue intermittent elevation of legs.   I discussed in depth with the patient the nature of atherosclerosis, and emphasized the importance of maximal medical management including strict control of blood pressure, blood glucose, and lipid levels, obtaining regular exercise, and continued cessation of smoking.  The patient is aware that without maximal medical management the underlying atherosclerotic disease process will progress, limiting the benefit of any interventions.  The patient was given information about PAD including signs, symptoms, treatment, what symptoms should prompt the patient to seek immediate medical care, and risk reduction measures to take.  Clemon Chambers, RN, MSN, FNP-C [facility] Phone: [phone]  Clinic MD: Trula Slade  04/14/18 10:29 AM

## 2018-08-12 ENCOUNTER — Ambulatory Visit (INDEPENDENT_AMBULATORY_CARE_PROVIDER_SITE_OTHER): Payer: Medicare Other | Admitting: Podiatry

## 2018-08-12 ENCOUNTER — Ambulatory Visit (INDEPENDENT_AMBULATORY_CARE_PROVIDER_SITE_OTHER): Payer: Medicare Other

## 2018-08-12 ENCOUNTER — Encounter: Payer: Self-pay | Admitting: Podiatry

## 2018-08-12 ENCOUNTER — Other Ambulatory Visit: Payer: Self-pay | Admitting: Podiatry

## 2018-08-12 VITALS — BP 95/57 | HR 71

## 2018-08-12 DIAGNOSIS — M799 Soft tissue disorder, unspecified: Secondary | ICD-10-CM

## 2018-08-12 DIAGNOSIS — M778 Other enthesopathies, not elsewhere classified: Secondary | ICD-10-CM

## 2018-08-12 DIAGNOSIS — M779 Enthesopathy, unspecified: Secondary | ICD-10-CM | POA: Diagnosis not present

## 2018-08-12 DIAGNOSIS — B353 Tinea pedis: Secondary | ICD-10-CM | POA: Diagnosis not present

## 2018-08-12 DIAGNOSIS — I83812 Varicose veins of left lower extremities with pain: Secondary | ICD-10-CM

## 2018-08-12 MED ORDER — KETOCONAZOLE 2 % EX CREA: 1.0000 "application " | TOPICAL_CREAM | Freq: Two times a day (BID) | CUTANEOUS | 2 refills | Status: DC

## 2018-08-12 NOTE — Progress Notes (Signed)
Subjective:  Patient ID: [name] May, female    DOB: 1941-04-01,  MRN: [id number] HPI Chief Complaint  Patient presents with  . Foot Pain    left foot blue lesion on bottom arch that has become sore since being there for 6 mos.    77 y.o. female presents with the above complaint.  ROS: Denies fever chills nausea vomiting muscle aches pains calf pain back pain chest pain shortness of breath.  Past Medical History:  Diagnosis Date  . Allergy   . Anemia   . Arthritis    left knee  . Back pain   . Bipolar disorder ([place])   . Chronic headaches   . Degenerative disorder of bone   . Depression   . Esophageal ulcer    history  . GERD (gastroesophageal reflux disease)   . Hyperlipidemia   . Peripheral vascular disease ([place])   . Pulmonary embolism ([place])   . Scoliosis    Past Surgical History:  Procedure Laterality Date  . ABDOMINAL HYSTERECTOMY    . ANKLE SURGERY    . COLONOSCOPY    . EYE SURGERY     caratacs  . FEMORAL ARTERY EXPLORATION  05/16/2017   Procedure: STENT OF BILATERAL COMMON FEMORAL VEIN WITH 16 X 90 WALL STENT;  Surgeon: Waynetta Sandy, MD;  Location: [place];  Service: Vascular;;  . hemroidectomy    . INSERTION OF ILIAC STENT Bilateral 05/16/2017   Procedure: STENT OF BILATERAL COMMON AND EXTERNAL ILIAC VEIN WITH 18 X 90 WALL STENT;  Surgeon: Waynetta Sandy, MD;  Location: [place];  Service: Vascular;  Laterality: Bilateral;  . INTRAVASCULAR ULTRASOUND/IVUS N/A 05/06/2017   Procedure: INTRAVASCULAR ULTRASOUND/IVUS;  Surgeon: Waynetta Sandy, MD;  Location: [facility];  Service: Vascular;  Laterality: N/A;  . IVC FILTER INSERTION N/A 05/16/2017   Procedure: MECHANICAL THROMBECTOMY OF IVC FILTER;  Surgeon: Waynetta Sandy, MD;  Location: [place] [facility];  Service: Vascular;  Laterality: N/A;  . LAMINECTOMY    . LOWER EXTREMITY VENOGRAPHY N/A 05/14/2017   Procedure: Lower Extremity Venography;  Surgeon: Serafina Mitchell, MD;  Location: [facility]  [facility];  Service: Cardiovascular;  Laterality: N/A;  . NASAL SINUS SURGERY    . PERCUTANEOUS VENOUS THROMBECTOMY,LYSIS WITH INTRAVASCULAR ULTRASOUND (IVUS) N/A 05/16/2017   Procedure: PERCUTANEOUS VENOUS THROMBECTOMY AND LYSIS WITH INTRAVASCULAR ULTRASOUND (IVUS);  Surgeon: Waynetta Sandy, MD;  Location: [place];  Service: Vascular;  Laterality: N/A;  . [place] N/A 05/16/2017   Procedure: RETRIEVAL INFERIOR VENA-CAVA FILTER;  Surgeon: Waynetta Sandy, MD;  Location: [place];  Service: Vascular;  Laterality: N/A;  . VENOGRAM Bilateral 05/16/2017   Procedure: VENOGRAM OF IVC;  Surgeon: Waynetta Sandy, MD;  Location: [facility];  Service: Vascular;  Laterality: Bilateral;    Current Outpatient Medications:  .  apixaban (ELIQUIS) 2.5 MG TABS tablet, TAKE 1 TABLET BY MOUTH TWICE DAILY, Disp: , Rfl:  .  ascorbic acid (VITAMIN C) 250 MG tablet, Take 250 mg by mouth 2 (two) times daily., Disp: , Rfl:  .  Besifloxacin HCl 0.6 % SUSP, , Disp: , Rfl:  .  Bromfenac Sodium 0.07 % SOLN, , Disp: , Rfl:  .  buPROPion (WELLBUTRIN XL) 300 MG 24 hr tablet, Take 300 mg by mouth daily. , Disp: , Rfl:  .  Calcium Carbonate-Vitamin D (CALCIUM + D PO), Take 1 tablet by mouth daily. , Disp: , Rfl:  .  Calcium Carbonate-Vitamin D (CALCIUM 500 + D) 500-125  MG-UNIT TABS, Take by mouth., Disp: , Rfl:  .  clorazepate (TRANXENE) 3.75 MG tablet, , Disp: , Rfl:  .  dicyclomine (BENTYL) 20 MG tablet, Take 20 mg by mouth every 6 (six) hours as needed for spasms., Disp: , Rfl:  .  Difluprednate 0.05 % EMUL, , Disp: , Rfl:  .  furosemide (LASIX) 20 MG tablet, TK 1 T PO BID FOR LEG EDEMA, Disp: , Rfl:  .  gabapentin (NEURONTIN) 600 MG tablet, Take 600 mg by mouth 3 (three) times daily. AT 1500, 1800, and 2100, Disp: , Rfl:  .  HYDROcodone-acetaminophen (NORCO) 10-325 MG tablet, Take by mouth., Disp: , Rfl:  .  hydrocortisone 2.5 % cream, Apply 1 application topically daily as needed (eczema). ,  Disp: , Rfl:  .  iron polysaccharides (NU-IRON) 150 MG capsule, Take 150 mg by mouth daily., Disp: , Rfl:  .  lamoTRIgine (LAMICTAL) 200 MG tablet, Take 200 mg by mouth at bedtime. , Disp: , Rfl:  .  loperamide (IMODIUM A-D) 2 MG tablet, Take 4-6 mg by mouth daily as needed for diarrhea or loose stools., Disp: , Rfl:  .  loratadine (CLARITIN) 10 MG tablet, Take 10 mg by mouth daily as needed (for seasonal allergies). , Disp: , Rfl:  .  LORazepam (ATIVAN) 0.5 MG tablet, Take 0.5 mg by mouth 2 (two) times daily., Disp: , Rfl:  .  magnesium oxide (MAG-OX) 400 MG tablet, TAKE 1 TABLET(400 MG) BY MOUTH TWICE DAILY, Disp: , Rfl:  .  MAGNESIUM-OXIDE 400 (241.3 Mg) MG tablet, Take 400 mg by mouth 2 (two) times daily., Disp: , Rfl: 0 .  methocarbamol (ROBAXIN) 500 MG tablet, Take 1 tablet (500 mg total) by mouth 3 (three) times daily., Disp: 30 tablet, Rfl: 0 .  oxyCODONE-acetaminophen (PERCOCET/ROXICET) 5-325 MG tablet, Take 1-2 tablets by mouth every 3 (three) hours as needed for moderate pain., Disp: 20 tablet, Rfl: 0 .  pantoprazole (PROTONIX) 40 MG tablet, Take 40 mg by mouth 2 (two) times daily as needed (acid reflux). , Disp: , Rfl:  .  rOPINIRole (REQUIP) 2 MG tablet, TAKE 1 TABLET BY MOUTH DAILY AT 3 PM, 6 PM, AND 9 PM *SCHEDULE AN APPOINTMENT FOR ADDITIONAL REFILLS*, Disp: , Rfl:  .  SUMAtriptan (IMITREX) 25 MG tablet, Take by mouth every 2 (two) hours as needed. , Disp: , Rfl:  .  traMADol (ULTRAM) 50 MG tablet, Take 50 mg by mouth 2 (two) times daily as needed. , Disp: , Rfl:  .  ketoconazole (NIZORAL) 2 % cream, Apply 1 application topically 2 (two) times daily., Disp: 15 g, Rfl: 2  Allergies  Allergen Reactions  . Latex Itching, Rash and Other (See Comments)    Blisters for months on legs  . Mold Extract [Trichophyton] Anaphylaxis, Swelling and Rash  . Molds & Smuts Anaphylaxis, Rash and Swelling  . Vancomycin Other (See Comments)    05/01/17: Pt noted to c/o redness and burning to R arm  during IV Vancomycin 1g infusion. Infusion stopped and diphenhydramine 25mg  IV given.   . Cephalexin Rash   Review of Systems Objective:   Vitals:   08/12/18 1025  BP: (!) 95/57  Pulse: 71    General: Well developed, nourished, in no acute distress, alert and oriented x3   Dermatological: Skin is warm, dry and supple bilateral. Nails x 10 are well maintained; remaining integument appears unremarkable at this time. There are no open sores, no preulcerative lesions, no rash or signs of infection  present.  Dressed xerotic skin with tinea pedis plantar aspect.  Onychomycosis.  Vascular: Dorsalis Pedis artery and Posterior Tibial artery pedal pulses are 2/4 bilateral with immedate capillary fill time. Pedal hair growth present. No varicosities and no lower extremity edema present bilateral.  She has what appears to be a varicosity to the plantar aspect of her left medial longitudinal arch mildly tender.  Neruologic: Grossly intact via light touch bilateral. Vibratory intact via tuning fork bilateral. Protective threshold with Semmes Wienstein monofilament intact to all pedal sites bilateral. Patellar and Achilles deep tendon reflexes 2+ bilateral. No Babinski or clonus noted bilateral.   Musculoskeletal: No gross boney pedal deformities bilateral. No pain, crepitus, or limitation noted with foot and ankle range of motion bilateral. Muscular strength 5/5 in all groups tested bilateral.  Gait: Unassisted, Nonantalgic.    Radiographs:  No acute findings.  Assessment & Plan:   Assessment: Varicosity medial longitudinal arch left.  Tinea pedis bilateral.  Plan: Ketoconazole was called in.  On follow-up with her in a couple of months to make sure this is resolved.     Juelz Claar T. [place], [place]

## 2018-10-14 ENCOUNTER — Ambulatory Visit: Payer: Medicare Other | Admitting: Sports Medicine

## 2018-11-13 IMAGING — CT CT VENOGRAM ABD-PELV
2 of 6 series · 14 of 46 positions shown, 16 images · IV contrast (iopamidol)
Comparison: 01/10/2011

ADDENDUM:
Critical Value/emergent results were called by telephone at the time
of interpretation on 05/08/2017 at [DATE] to Dr. EMMANUELA MORLAN , who
verbally acknowledged these results.
CLINICAL DATA: DVT

EXAM:
CT VENOGRAM ABD-PELVIS
TECHNIQUE: Multidetector CT imaging of the abdomen and pelvis was performed
using the standard protocol during bolus administration of
intravenous contrast. Multiplanar reconstructed images and MIPs were
obtained and reviewed to evaluate the vascular anatomy. The study
was performed with a venous phase delay in evaluation of the venous
structures.
CONTRAST:  100mL [id number] IOPAMIDOL ([id number]) INJECTION 61%

[Series 3: abd/ pelvis 5.0 i30f 2 · axial · 0.84mm/px · z∈[-474,-119]mm · 11 of 79 slices shown, 13 images]
[im 4/79  soft-tissue]
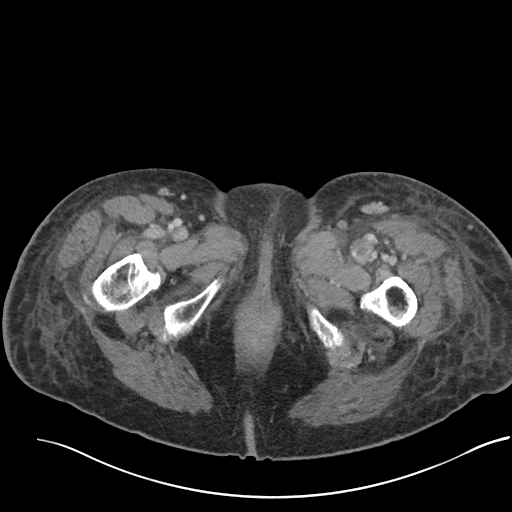
[im 4/79  bone]
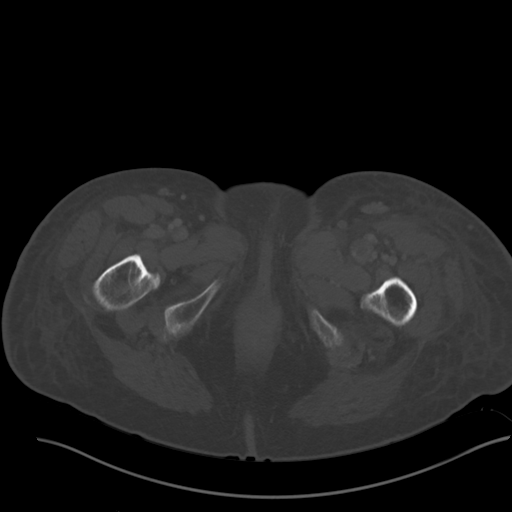
[im 11/79  soft-tissue]
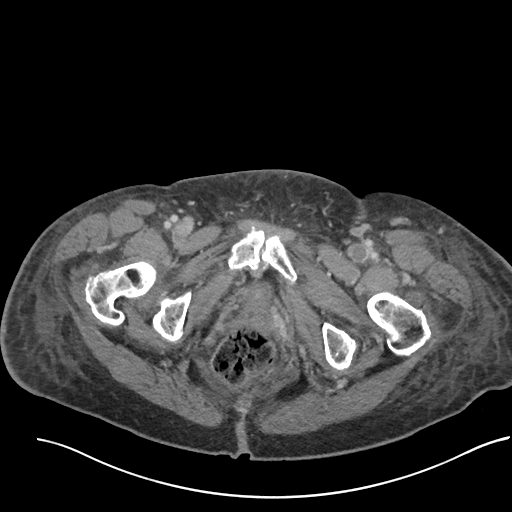
[im 18/79  soft-tissue]
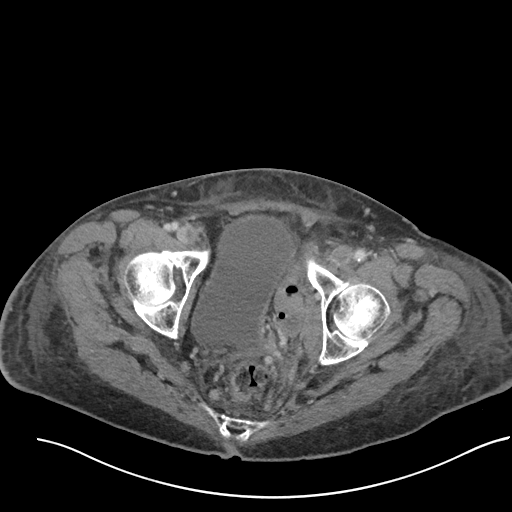
[im 25/79  soft-tissue]
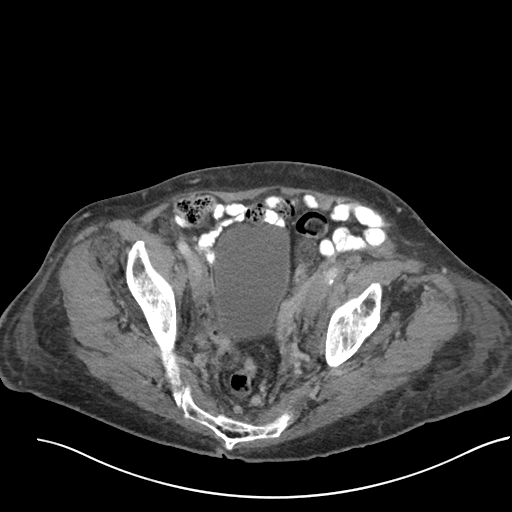
[im 32/79  soft-tissue]
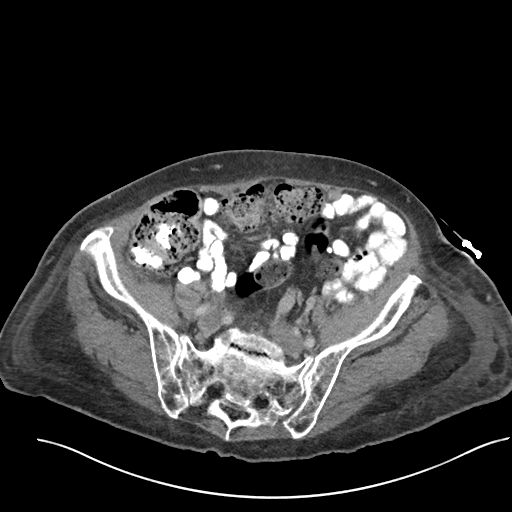
[im 40/79  soft-tissue]
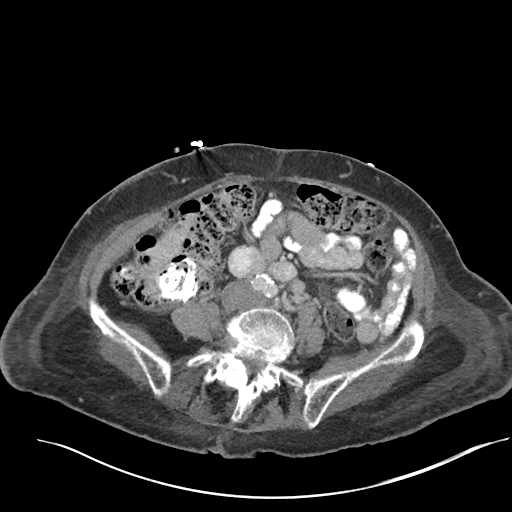
[im 47/79  soft-tissue]
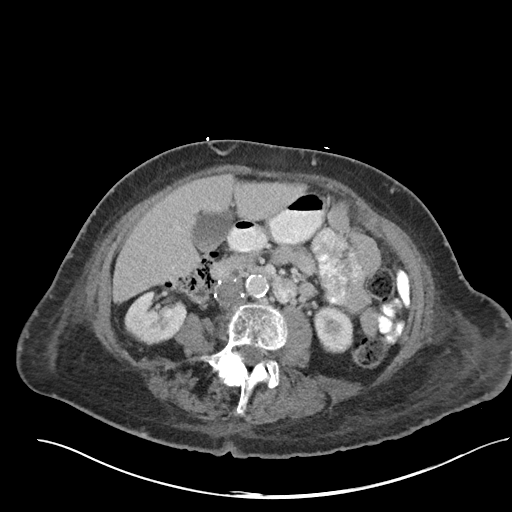
[im 54/79  soft-tissue]
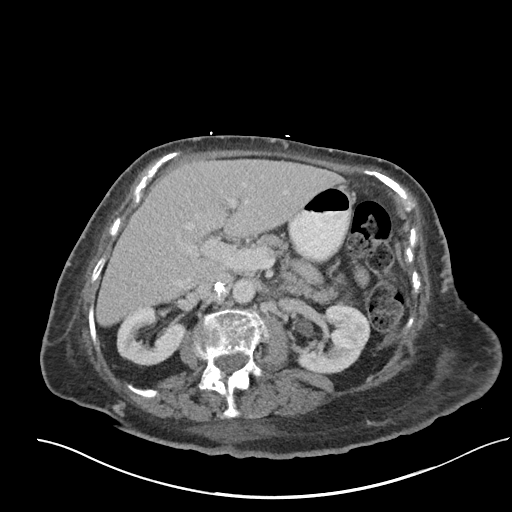
[im 61/79  soft-tissue]
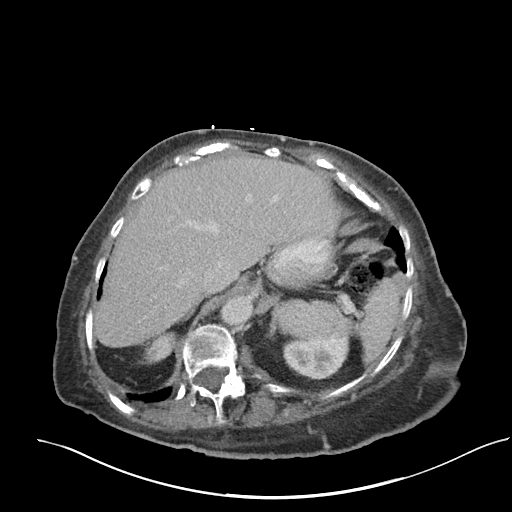
[im 61/79  bone]
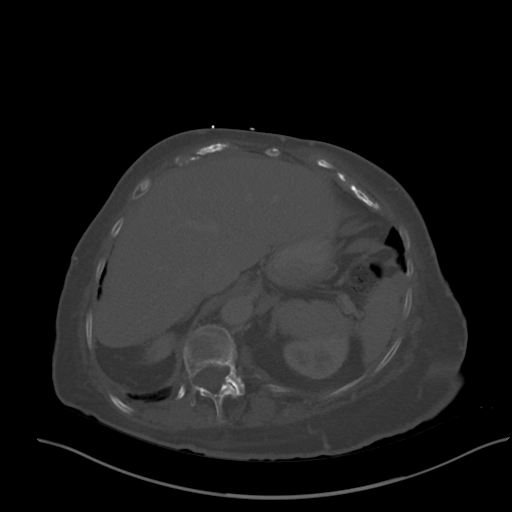
[im 68/79  soft-tissue]
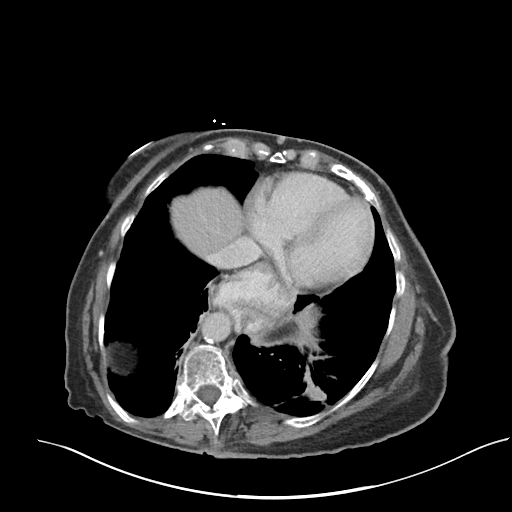
[im 75/79  soft-tissue]
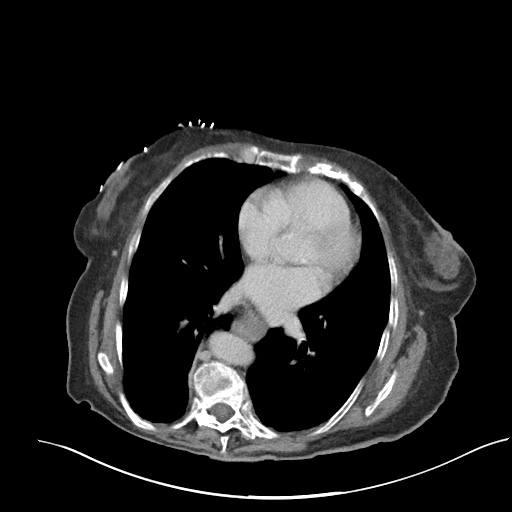

[Series 6: coronal soft tissue · coronal · 0.65mm/px · 3 of 86 slices shown]
[im 22/86  soft-tissue]
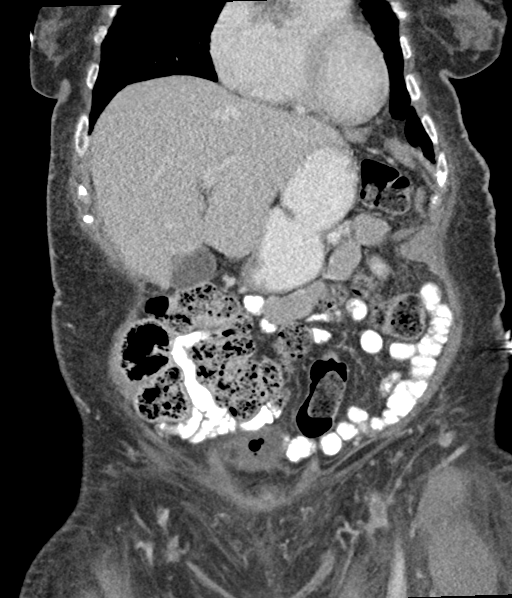
[im 43/86  soft-tissue]
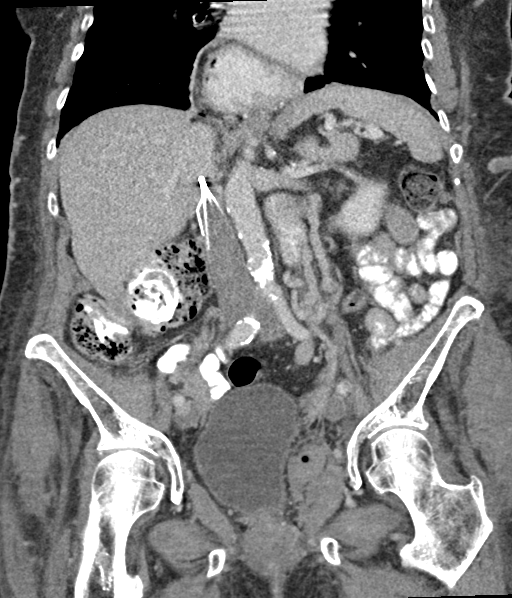
[im 64/86  soft-tissue]
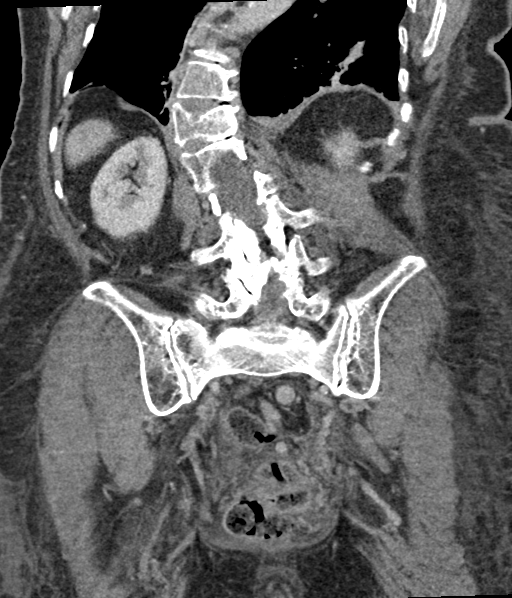

[14 of 46 positions shown; findings below may reference images not displayed]

FINDINGS: VASCULAR

Veins: An IVC filter is in place with its tip just below the renal
vein inflow. Above the filter, the IVC and renal veins are patent.
The IVC is thrombosed including the space inferior to the IVC
filter. Bilateral common iliac veins are thrombosed. There is
partially occlusive thrombus in the distal right external iliac
vein. There is complete occlusion of the left external iliac vein.
Thrombus extends into the left common femoral vein and femoral vein.
There is no evidence of right common femoral vein or femoral vein
thrombosis. Imaging was not performed below the proximal thigh
regions.

Review of the MIP images confirms the above findings.

NON-VASCULAR

Lower chest: There is a triangular opacity at the left lung base
which may simply represent atelectasis. Underlying lung lesion
cannot be excluded. It measures 1.8 x 3.4 cm on image 12. It is also
new compared to a chest CT dated 02/06/2015.

Hepatobiliary: Diffuse hepatic steatosis. Cholelithiasis. There is
also some sludge in the gallbladder.

Pancreas: Unremarkable

Spleen: Unremarkable

Adrenals/Urinary Tract: Normal adrenal glands. Tiny hypodensities in
the kidneys are nonspecific and too small to characterize. Bladder
contains a small fleck of gas. This is of unknown significance and
can be secondary to catheterization.

Stomach/Bowel: There is no evidence of small-bowel obstruction.
Moderate stool burden in the ascending and transverse colon.

Lymphatic: There is no abnormal retroperitoneal adenopathy.

Reproductive: Within the left adnexa, there is any regular mixed
soft tissue and fluid density abnormality containing gas. It
measures 4.7 x 2.6 cm. Head also exerts mass effect upon the
bladder. Loculated abscess cannot be excluded. There are also a few
gas bubbles posterior to the abnormality. See images 59 through 64
of series 3. The gas bubbles are extraluminal and retroperitoneal.
There is no obvious bowel loop leading to this area to suggest that
this is a loop of bowel. Pelvic varices are noted.

Other: No obvious abdominal wall hernia.

Musculoskeletal: There is a T12 compression fracture with 50% loss
of height involving the left side of the vertebral body. Age is
indeterminate. There is also a slight depression of the superior
endplate of L4.
IMPRESSION: VASCULAR

There is extensive DVT throughout the iliac venous system and IVC to
the level of the IVC filter. Above the filter, the IVC is patent.
DVT extends into the left common femoral and femoral veins. Renal
veins are also patent. See above comments for details.

NON-VASCULAR

There is an abnormal soft tissue canal fluid, and gas collection in
the left hemipelvis. There are also a few extraluminal gas bubbles
about this abnormality in the adjacent retroperitoneum. Abscess
and/or ruptured bowel cannot be exclude.

Cholelithiasis.

Abnormal indeterminate pulmonary parenchymal density at the left
lung base. Initial follow-up by chest CT without contrast is
recommended in 3 months to confirm persistence. This recommendation
follows the consensus statement: Recommendations for the Management
of Subsolid Pulmonary Nodules Detected at CT: A Statement from the

## 2020-07-01 ENCOUNTER — Encounter (HOSPITAL_COMMUNITY): Payer: Self-pay

## 2020-07-01 ENCOUNTER — Emergency Department (HOSPITAL_COMMUNITY): Payer: Medicare Other

## 2020-07-01 ENCOUNTER — Other Ambulatory Visit: Payer: Self-pay

## 2020-07-01 ENCOUNTER — Inpatient Hospital Stay (HOSPITAL_COMMUNITY)
Admission: EM | Admit: 2020-07-01 | Discharge: 2020-07-06 | DRG: 177 | Disposition: A | Discharge status: Home / Self Care | Payer: Medicare Other | Attending: Internal Medicine | Admitting: Internal Medicine

## 2020-07-01 DIAGNOSIS — E860 Dehydration: Secondary | ICD-10-CM | POA: Diagnosis present

## 2020-07-01 DIAGNOSIS — G2581 Restless legs syndrome: Secondary | ICD-10-CM | POA: Diagnosis present

## 2020-07-01 DIAGNOSIS — Z888 Allergy status to other drugs, medicaments and biological substances status: Secondary | ICD-10-CM | POA: Diagnosis not present

## 2020-07-01 DIAGNOSIS — Z95828 Presence of other vascular implants and grafts: Secondary | ICD-10-CM | POA: Diagnosis not present

## 2020-07-01 DIAGNOSIS — K219 Gastro-esophageal reflux disease without esophagitis: Secondary | ICD-10-CM | POA: Diagnosis present

## 2020-07-01 DIAGNOSIS — Z91040 Latex allergy status: Secondary | ICD-10-CM | POA: Diagnosis not present

## 2020-07-01 DIAGNOSIS — F319 Bipolar disorder, unspecified: Secondary | ICD-10-CM | POA: Diagnosis present

## 2020-07-01 DIAGNOSIS — G43909 Migraine, unspecified, not intractable, without status migrainosus: Secondary | ICD-10-CM | POA: Diagnosis present

## 2020-07-01 DIAGNOSIS — D696 Thrombocytopenia, unspecified: Secondary | ICD-10-CM | POA: Diagnosis not present

## 2020-07-01 DIAGNOSIS — J1282 Pneumonia due to coronavirus disease 2019: Secondary | ICD-10-CM | POA: Diagnosis present

## 2020-07-01 DIAGNOSIS — Z79899 Other long term (current) drug therapy: Secondary | ICD-10-CM

## 2020-07-01 DIAGNOSIS — Z7901 Long term (current) use of anticoagulants: Secondary | ICD-10-CM

## 2020-07-01 DIAGNOSIS — J9621 Acute and chronic respiratory failure with hypoxia: Secondary | ICD-10-CM | POA: Diagnosis present

## 2020-07-01 DIAGNOSIS — N179 Acute kidney failure, unspecified: Secondary | ICD-10-CM | POA: Diagnosis present

## 2020-07-01 DIAGNOSIS — Z86718 Personal history of other venous thrombosis and embolism: Secondary | ICD-10-CM

## 2020-07-01 DIAGNOSIS — Z91048 Other nonmedicinal substance allergy status: Secondary | ICD-10-CM

## 2020-07-01 DIAGNOSIS — J440 Chronic obstructive pulmonary disease with acute lower respiratory infection: Secondary | ICD-10-CM | POA: Diagnosis present

## 2020-07-01 DIAGNOSIS — Z95820 Peripheral vascular angioplasty status with implants and grafts: Secondary | ICD-10-CM

## 2020-07-01 DIAGNOSIS — I959 Hypotension, unspecified: Secondary | ICD-10-CM | POA: Diagnosis present

## 2020-07-01 DIAGNOSIS — Z87891 Personal history of nicotine dependence: Secondary | ICD-10-CM | POA: Diagnosis not present

## 2020-07-01 DIAGNOSIS — U071 COVID-19: Secondary | ICD-10-CM | POA: Diagnosis present

## 2020-07-01 DIAGNOSIS — D539 Nutritional anemia, unspecified: Secondary | ICD-10-CM | POA: Diagnosis present

## 2020-07-01 DIAGNOSIS — G8929 Other chronic pain: Secondary | ICD-10-CM | POA: Diagnosis present

## 2020-07-01 DIAGNOSIS — Z86711 Personal history of pulmonary embolism: Secondary | ICD-10-CM | POA: Diagnosis not present

## 2020-07-01 DIAGNOSIS — R079 Chest pain, unspecified: Secondary | ICD-10-CM

## 2020-07-01 DIAGNOSIS — J9601 Acute respiratory failure with hypoxia: Secondary | ICD-10-CM | POA: Diagnosis present

## 2020-07-01 DIAGNOSIS — D61818 Other pancytopenia: Secondary | ICD-10-CM | POA: Diagnosis present

## 2020-07-01 DIAGNOSIS — I739 Peripheral vascular disease, unspecified: Secondary | ICD-10-CM | POA: Diagnosis present

## 2020-07-01 DIAGNOSIS — R0902 Hypoxemia: Secondary | ICD-10-CM

## 2020-07-01 DIAGNOSIS — E785 Hyperlipidemia, unspecified: Secondary | ICD-10-CM | POA: Diagnosis present

## 2020-07-01 DIAGNOSIS — M419 Scoliosis, unspecified: Secondary | ICD-10-CM | POA: Diagnosis present

## 2020-07-01 HISTORY — DX: Chronic obstructive pulmonary disease, unspecified: J44.9

## 2020-07-01 LAB — CBC WITH DIFFERENTIAL/PLATELET
Abs Immature Granulocytes: 0.01 10*3/uL (ref 0.00–0.07)
Basophils Absolute: 0 10*3/uL (ref 0.0–0.1)
Basophils Relative: 1 %
Eosinophils Absolute: 0 10*3/uL (ref 0.0–0.5)
Eosinophils Relative: 0 %
HCT: 37.3 % (ref 36.0–46.0)
Hemoglobin: 11.4 g/dL — ABNORMAL LOW (ref 12.0–15.0)
Immature Granulocytes: 0 %
Lymphocytes Relative: 29 %
Lymphs Abs: 1.1 10*3/uL (ref 0.7–4.0)
MCH: 31 pg (ref 26.0–34.0)
MCHC: 30.6 g/dL (ref 30.0–36.0)
MCV: 101.4 fL — ABNORMAL HIGH (ref 80.0–100.0)
Monocytes Absolute: 0.4 10*3/uL (ref 0.1–1.0)
Monocytes Relative: 10 %
Neutro Abs: 2.3 10*3/uL (ref 1.7–7.7)
Neutrophils Relative %: 60 %
Platelets: 144 10*3/uL — ABNORMAL LOW (ref 150–400)
RBC: 3.68 MIL/uL — ABNORMAL LOW (ref 3.87–5.11)
RDW: 13.3 % (ref 11.5–15.5)
WBC: 3.8 10*3/uL — ABNORMAL LOW (ref 4.0–10.5)
nRBC: 0 % (ref 0.0–0.2)

## 2020-07-01 LAB — URINALYSIS, ROUTINE W REFLEX MICROSCOPIC
Bacteria, UA: NONE SEEN
Bilirubin Urine: NEGATIVE
Glucose, UA: NEGATIVE mg/dL
Hgb urine dipstick: NEGATIVE
Ketones, ur: NEGATIVE mg/dL
Nitrite: NEGATIVE
Protein, ur: NEGATIVE mg/dL
Specific Gravity, Urine: 1.012 (ref 1.005–1.030)
pH: 5 (ref 5.0–8.0)

## 2020-07-01 LAB — BASIC METABOLIC PANEL
Anion gap: 10 (ref 5–15)
BUN: 21 mg/dL (ref 8–23)
CO2: 24 mmol/L (ref 22–32)
Calcium: 8.2 mg/dL — ABNORMAL LOW (ref 8.9–10.3)
Chloride: 103 mmol/L (ref 98–111)
Creatinine, Ser: 1.1 mg/dL — ABNORMAL HIGH (ref 0.44–1.00)
GFR calc Af Amer: 56 mL/min — ABNORMAL LOW (ref >60)
GFR calc non Af Amer: 48 mL/min — ABNORMAL LOW (ref >60)
Glucose, Bld: 96 mg/dL (ref 70–99)
Potassium: 3.8 mmol/L (ref 3.5–5.1)
Sodium: 137 mmol/L (ref 135–145)

## 2020-07-01 LAB — FIBRINOGEN: Fibrinogen: 435 mg/dL (ref 210–475)

## 2020-07-01 LAB — SARS CORONAVIRUS 2 BY RT PCR (HOSPITAL ORDER, PERFORMED IN ~~LOC~~ HOSPITAL LAB): SARS Coronavirus 2: POSITIVE — AB

## 2020-07-01 LAB — HEPATITIS B SURFACE ANTIGEN: Hepatitis B Surface Ag: NONREACTIVE

## 2020-07-01 LAB — D-DIMER, QUANTITATIVE: D-Dimer, Quant: 0.55 ug/mL-FEU — ABNORMAL HIGH (ref 0.00–0.50)

## 2020-07-01 LAB — TSH: TSH: 2.604 u[IU]/mL (ref 0.350–4.500)

## 2020-07-01 LAB — TROPONIN I (HIGH SENSITIVITY): Troponin I (High Sensitivity): 5 ng/L (ref <18)

## 2020-07-01 LAB — FOLATE: Folate: 12.1 ng/mL (ref >5.9)

## 2020-07-01 LAB — PROTIME-INR
INR: 1.2 (ref 0.8–1.2)
Prothrombin Time: 14.6 seconds (ref 11.4–15.2)

## 2020-07-01 LAB — LACTIC ACID, PLASMA: Lactic Acid, Venous: 1 mmol/L (ref 0.5–1.9)

## 2020-07-01 LAB — C-REACTIVE PROTEIN: CRP: 0.5 mg/dL (ref <1.0)

## 2020-07-01 LAB — APTT: aPTT: 34 seconds (ref 24–36)

## 2020-07-01 LAB — BRAIN NATRIURETIC PEPTIDE: B Natriuretic Peptide: 50.4 pg/mL (ref 0.0–100.0)

## 2020-07-01 LAB — LACTATE DEHYDROGENASE: LDH: 193 U/L — ABNORMAL HIGH (ref 98–192)

## 2020-07-01 LAB — VITAMIN B12: Vitamin B-12: 105 pg/mL — ABNORMAL LOW (ref 180–914)

## 2020-07-01 LAB — FERRITIN: Ferritin: 88 ng/mL (ref 11–307)

## 2020-07-01 LAB — PROCALCITONIN: Procalcitonin: <0.1 ng/mL

## 2020-07-01 MED ORDER — SODIUM CHLORIDE 0.9 % IV SOLN
200.0000 mg | Freq: Once | INTRAVENOUS | Status: AC
  Administered 2020-07-01: 200 mg via INTRAVENOUS
  Filled 2020-07-01: qty 40

## 2020-07-01 MED ORDER — SODIUM CHLORIDE 0.9 % IV SOLN
100.0000 mg | Freq: Every day | INTRAVENOUS | Status: AC
  Administered 2020-07-02 – 2020-07-05 (×4): 100 mg via INTRAVENOUS
  Filled 2020-07-01 (×5): qty 20

## 2020-07-01 MED ORDER — ZINC SULFATE 220 (50 ZN) MG PO CAPS
220.0000 mg | ORAL_CAPSULE | Freq: Every day | ORAL | Status: DC
  Administered 2020-07-01 – 2020-07-06 (×6): 220 mg via ORAL
  Filled 2020-07-01 (×6): qty 1

## 2020-07-01 MED ORDER — DEXAMETHASONE SODIUM PHOSPHATE 10 MG/ML IJ SOLN
10.0000 mg | Freq: Once | INTRAMUSCULAR | Status: AC
  Administered 2020-07-01: 10 mg via INTRAVENOUS
  Filled 2020-07-01: qty 1

## 2020-07-01 MED ORDER — ASCORBIC ACID 500 MG PO TABS
500.0000 mg | ORAL_TABLET | Freq: Every day | ORAL | Status: DC
  Administered 2020-07-01: 500 mg via ORAL
  Filled 2020-07-01: qty 1

## 2020-07-01 MED ORDER — ONDANSETRON HCL 4 MG/2ML IJ SOLN
4.0000 mg | Freq: Four times a day (QID) | INTRAMUSCULAR | Status: DC | PRN
  Administered 2020-07-01 – 2020-07-05 (×2): 4 mg via INTRAVENOUS
  Filled 2020-07-01 (×2): qty 2

## 2020-07-01 MED ORDER — ACETAMINOPHEN 325 MG PO TABS
650.0000 mg | ORAL_TABLET | Freq: Four times a day (QID) | ORAL | Status: DC | PRN
  Administered 2020-07-02 – 2020-07-06 (×2): 650 mg via ORAL
  Filled 2020-07-01 (×2): qty 2

## 2020-07-01 MED ORDER — LOPERAMIDE HCL 2 MG PO CAPS
2.0000 mg | ORAL_CAPSULE | ORAL | Status: DC | PRN
  Administered 2020-07-04: 2 mg via ORAL
  Filled 2020-07-01: qty 1

## 2020-07-01 MED ORDER — GUAIFENESIN-DM 100-10 MG/5ML PO SYRP: 10.0000 mL | ORAL_SOLUTION | ORAL | Status: DC | PRN

## 2020-07-01 MED ORDER — ALBUTEROL SULFATE HFA 108 (90 BASE) MCG/ACT IN AERS
2.0000 | INHALATION_SPRAY | Freq: Four times a day (QID) | RESPIRATORY_TRACT | Status: DC
  Administered 2020-07-01 – 2020-07-06 (×18): 2 via RESPIRATORY_TRACT
  Filled 2020-07-01: qty 6.7

## 2020-07-01 MED ORDER — ONDANSETRON HCL 4 MG PO TABS: 4.0000 mg | ORAL_TABLET | Freq: Four times a day (QID) | ORAL | Status: DC | PRN

## 2020-07-01 MED ORDER — LACTATED RINGERS IV SOLN: INTRAVENOUS | Status: DC

## 2020-07-01 MED ORDER — HYDROCOD POLST-CPM POLST ER 10-8 MG/5ML PO SUER: 5.0000 mL | Freq: Two times a day (BID) | ORAL | Status: DC | PRN

## 2020-07-01 MED ORDER — METHYLPREDNISOLONE SODIUM SUCC 40 MG IJ SOLR
0.5000 mg/kg | Freq: Two times a day (BID) | INTRAMUSCULAR | Status: DC
  Administered 2020-07-02 – 2020-07-06 (×9): 34 mg via INTRAVENOUS
  Filled 2020-07-01 (×9): qty 1

## 2020-07-01 NOTE — ED Triage Notes (Signed)
Pt arrived to ED from home via EMS because her PCP told her to come to the ED to get tested for COVID. Onset of s/s 3 days ago (cough, weakness, diarrhea, headache, fever 100.5) EMS gave 1000mg  tylenol. Pt is not in any distress at this time.

## 2020-07-01 NOTE — ED Notes (Signed)
Pt noted to have O2 off and RA sat was 86%; Pt educated to keep O2 on; pt verbalized understanding

## 2020-07-01 NOTE — ED Notes (Signed)
Pt placed on 3L via [place]. Pt is on prn O2 at 2L at home at baseline.

## 2020-07-01 NOTE — H&P (Signed)
History and Physical    [name] May ZOX:[id number] DOB: Mar 22, 1941 DOA: 07/01/2020  PCP: Jefm Petty, MD  Patient coming from: Home I have personally briefly reviewed patient's old medical records in [place]  Chief Complaint: Cough, fever, generalized weakness since 3 days.  HPI: [name] May is a 79 y.o. 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  Patient tells me that she has cough, low-grade fever, chills, diarrhea, shortness of breath, generalized weakness, lethargy and has no energy since 3 days.  She tells me her symptoms started getting worse this morning and her oxygen saturation was noted to be in 70s at home.  She called her PCP this morning who advised her to go to the ER for further evaluation and management.  Patient has home oxygen however she does not use on regular basis.  She tells me that she used oxygen 2-3 times in last 2 years.  She was vaccinated against Covid in January.  She denies headache, blurry vision, lightheadedness, dizziness, chest pain, leg swelling, orthopnea, PND, abdominal pain, urinary changes.  Former smoker.  Quit smoking 29 years ago.  Denies use of alcohol, illicit drugs.  ED Course: Upon arrival to ED: Patient hypoxic requiring 3 L of oxygen via nasal cannula, blood pressure on lower side.  Afebrile, not tachycardic, not tachypneic, CBC shows WBC of 3.8, macrocytic anemia, platelet: 144, CMP shows AKI, Covid positive, UA, UC, BC: Pending.  Lactic acid: WNL.  Chest x-ray shows patchy atelectasis or infiltrate at both lung bases.  Cardiomegaly.  Patient received Decadron and remdesivir in ED.  [facility] hospitalist consulted for admission for acute hypoxemic respiratory failure secondary to COVID-19 pneumonia.  Review of Systems:  As per HPI otherwise negative.    Past Medical History:  Diagnosis Date  . Allergy   . Anemia   . Arthritis    left knee  . Back pain   . Bipolar disorder ([place])   . Chronic headaches   . COPD (chronic obstructive pulmonary disease) ([place])   . Degenerative disorder of bone   . Depression   . Esophageal ulcer    history  . GERD (gastroesophageal reflux disease)   . Hyperlipidemia   . Peripheral vascular disease ([place])   . Pulmonary embolism ([place])   . Scoliosis     Past Surgical History:  Procedure Laterality Date  . ABDOMINAL HYSTERECTOMY    . ANKLE SURGERY    . COLONOSCOPY    . EYE SURGERY     car[place]  . FEMORAL ARTERY EXPLORATION  05/16/2017   Procedure: STENT OF BILATERAL COMMON FEMORAL VEIN WITH 16 X 90 WALL STENT;  Surgeon: Waynetta Sandy, MD;  Location: [facility];  Service: Vascular;;  . hemroidectomy    . INSERTION OF ILIAC STENT Bilateral 05/16/2017   Procedure: STENT OF BILATERAL COMMON AND EXTERNAL ILIAC VEIN WITH 18 X 90 WALL STENT;  Surgeon: Waynetta Sandy, MD;  Location: [facility];  Service: Vascular;  Laterality: Bilateral;  . INTRAVASCULAR ULTRASOUND/IVUS N/A 05/06/2017   Procedure: INTRAVASCULAR ULTRASOUND/IVUS;  Surgeon: Waynetta Sandy, MD;  Location: [facility];  Service: Vascular;  Laterality: N/A;  . IVC FILTER INSERTION N/A 05/16/2017   Procedure: MECHANICAL THROMBECTOMY OF IVC FILTER;  Surgeon: Waynetta Sandy, MD;  Location: [facility];  Service: Vascular;  Laterality: N/A;  . LAMINECTOMY    .  LOWER EXTREMITY VENOGRAPHY N/A 05/14/2017   Procedure: Lower Extremity Venography;  Surgeon: Serafina Mitchell, MD;  Location: [facility];  Service: Cardiovascular;  Laterality: N/A;  . NASAL SINUS SURGERY    . PERCUTANEOUS VENOUS THROMBECTOMY,LYSIS WITH INTRAVASCULAR ULTRASOUND (IVUS) N/A 05/16/2017   Procedure: PERCUTANEOUS VENOUS THROMBECTOMY AND LYSIS WITH INTRAVASCULAR ULTRASOUND (IVUS);  Surgeon: Waynetta Sandy, MD;   Location: [facility];  Service: Vascular;  Laterality: N/A;  . [place] N/A 05/16/2017   Procedure: RETRIEVAL INFERIOR VENA-CAVA FILTER;  Surgeon: Waynetta Sandy, MD;  Location: [facility] [facility];  Service: Vascular;  Laterality: N/A;  . VENOGRAM Bilateral 05/16/2017   Procedure: VENOGRAM OF IVC;  Surgeon: Waynetta Sandy, MD;  Location: [facility];  Service: Vascular;  Laterality: Bilateral;     reports that she has quit smoking. She has never used smokeless tobacco. She reports that she does not drink alcohol and does not use drugs.  Allergies  Allergen Reactions  . Latex Itching, Rash and Other (See Comments)    Blisters for months on legs  . Mold Extract [Trichophyton] Anaphylaxis, Swelling and Rash  . Molds & Smuts Anaphylaxis, Rash and Swelling  . Vancomycin Other (See Comments)    05/01/17: Pt noted to c/o redness and burning to R arm during IV Vancomycin 1g infusion. Infusion stopped and diphenhydramine 25mg  IV given.   . Cephalexin Rash    Family History  Problem Relation Age of Onset  . Deep vein thrombosis Neg Hx     Prior to Admission medications   Medication Sig Start Date End Date Taking? Authorizing Provider  apixaban (ELIQUIS) 2.5 MG TABS tablet TAKE 1 TABLET BY MOUTH TWICE DAILY 07/24/18   [provider]  ascorbic acid (VITAMIN C) 250 MG tablet Take 250 mg by mouth 2 (two) times daily.    [provider]  Besifloxacin HCl 0.6 % SUSP  10/17/16   [provider]  Bromfenac Sodium 0.07 % SOLN  10/17/16   [provider]  buPROPion (WELLBUTRIN XL) 300 MG 24 hr tablet Take 300 mg by mouth daily.  02/25/14   [provider]  Calcium Carbonate-Vitamin D (CALCIUM + D PO) Take 1 tablet by mouth daily.     [provider]  Calcium Carbonate-Vitamin D (CALCIUM 500 + D) 500-125 MG-UNIT TABS Take by mouth.    [provider]  clorazepate (TRANXENE) 3.75 MG tablet  03/18/15   [provider]    dicyclomine (BENTYL) 20 MG tablet Take 20 mg by mouth every 6 (six) hours as needed for spasms.    [provider]  Difluprednate 0.05 % EMUL  10/17/16   [provider]  furosemide (LASIX) 20 MG tablet TK 1 T PO BID FOR LEG EDEMA 07/08/15   [provider]  gabapentin (NEURONTIN) 600 MG tablet Take 600 mg by mouth 3 (three) times daily. AT 1500, 1800, and 2100 02/25/14   [provider]  HYDROcodone-acetaminophen (NORCO) 10-325 MG tablet Take by mouth. 02/10/18   [provider]  hydrocortisone 2.5 % cream Apply 1 application topically daily as needed (eczema).  01/06/14   [provider]  iron polysaccharides (NU-IRON) 150 MG capsule Take 150 mg by mouth daily.    [provider]  ketoconazole (NIZORAL) 2 % cream Apply 1 application topically 2 (two) times daily. 08/12/18   Hyatt, Max T, DPM  lamoTRIgine (LAMICTAL) 200 MG tablet Take 200 mg by mouth at bedtime.  02/20/14   [provider]  loperamide (IMODIUM A-D) 2 MG tablet Take 4-6 mg by mouth daily as needed for diarrhea or loose stools.    [provider]  loratadine (CLARITIN) 10 MG tablet Take 10 mg by mouth daily as needed (for seasonal allergies).     [provider]  LORazepam (ATIVAN) 0.5 MG tablet Take 0.5 mg by mouth 2 (two) times daily.    [provider]  magnesium oxide (MAG-OX) 400 MG tablet TAKE 1 TABLET(400 MG) BY MOUTH TWICE DAILY 02/26/18   [provider]  MAGNESIUM-OXIDE 400 (241.3 Mg) MG tablet Take 400 mg by mouth 2 (two) times daily. 04/30/17   [provider]  methocarbamol (ROBAXIN) 500 MG tablet Take 1 tablet (500 mg total) by mouth 3 (three) times daily. 05/03/17   Lavina Hamman, MD  oxyCODONE-acetaminophen (PERCOCET/ROXICET) 5-325 MG tablet Take 1-2 tablets by mouth every 3 (three) hours as needed for moderate pain. 05/07/17   Alvia Grove, PA-C  pantoprazole (PROTONIX) 40 MG tablet Take 40 mg by mouth 2  (two) times daily as needed (acid reflux).  01/24/14   [provider]  rOPINIRole (REQUIP) 2 MG tablet TAKE 1 TABLET BY MOUTH DAILY AT 3 PM, 6 PM, AND 9 PM *SCHEDULE AN APPOINTMENT FOR ADDITIONAL REFILLS* 02/02/15   [provider]  SUMAtriptan (IMITREX) 25 MG tablet Take by mouth every 2 (two) hours as needed.  02/10/18   [provider]  traMADol (ULTRAM) 50 MG tablet Take 50 mg by mouth 2 (two) times daily as needed.  02/10/18   [provider]    Physical Exam: Vitals:   07/01/20 1715 07/01/20 1730 07/01/20 1745 07/01/20 1815  BP: (!) 83/55 (!) 93/54 132/69 136/66  Pulse: 66 67 68 73  Resp: 12 15 13 16   Temp:      TempSrc:      SpO2: 95% 94% 95% 97%  Weight:      Height:        Constitutional: NAD, calm, comfortable 3 L of oxygen via nasal cannula Eyes: PERRL, lids and conjunctivae normal ENMT: Mucous membranes are moist. Posterior pharynx clear of any exudate or lesions.Normal dentition.  Neck: normal, supple, no masses, no thyromegaly Respiratory: clear to auscultation bilaterally, no wheezing, no crackles. Normal respiratory effort. No accessory muscle use.  Cardiovascular: Regular rate and rhythm, no murmurs / rubs / gallops. No extremity edema. 2+ pedal pulses. No carotid bruits.  Abdomen: no tenderness, no masses palpated. No hepatosplenomegaly. Bowel sounds positive.  Musculoskeletal: no clubbing / cyanosis. No joint deformity upper and lower extremities. Good ROM, no contractures. Normal muscle tone.  Skin: no rashes, lesions, ulcers. No induration Neurologic: CN 2-12 grossly intact. Sensation intact, DTR normal. Strength 5/5 in all 4.  Psychiatric: Normal judgment and insight. Alert and oriented x 3. Normal mood.    Labs on Admission: I have personally reviewed following labs and imaging studies  CBC: Recent Labs  Lab 07/01/20 1521  WBC 3.8*  NEUTROABS 2.3  HGB 11.4*  HCT 37.3  MCV 101.4*  PLT 604*   Basic Metabolic  Panel: Recent Labs  Lab 07/01/20 1521  NA 137  K 3.8  CL 103  CO2 24  GLUCOSE 96  BUN 21  CREATININE 1.10*  CALCIUM 8.2*   GFR: Estimated Creatinine Clearance: 33.5 mL/min (A) (by C-G formula based on SCr of 1.1 mg/dL (H)). Liver Function Tests: No results for input(s): AST, ALT, ALKPHOS, BILITOT, PROT, ALBUMIN in the last 168 hours. No  results for input(s): LIPASE, AMYLASE in the last 168 hours. No results for input(s): AMMONIA in the last 168 hours. Coagulation Profile: No results for input(s): INR, PROTIME in the last 168 hours. Cardiac Enzymes: No results for input(s): CKTOTAL, CKMB, CKMBINDEX, TROPONINI in the last 168 hours. BNP (last 3 results) No results for input(s): PROBNP in the last 8760 hours. HbA1C: No results for input(s): HGBA1C in the last 72 hours. CBG: No results for input(s): GLUCAP in the last 168 hours. Lipid Profile: No results for input(s): CHOL, HDL, LDLCALC, TRIG, CHOLHDL, LDLDIRECT in the last 72 hours. Thyroid Function Tests: No results for input(s): TSH, T4TOTAL, FREET4, T3FREE, THYROIDAB in the last 72 hours. Anemia Panel: No results for input(s): VITAMINB12, FOLATE, FERRITIN, TIBC, IRON, RETICCTPCT in the last 72 hours. Urine analysis:    Component Value Date/Time   COLORURINE YELLOW 04/22/2017 2039   APPEARANCEUR CLEAR 04/22/2017 2039   LABSPEC 1.010 04/22/2017 2039   PHURINE 5.0 04/22/2017 2039   GLUCOSEU NEGATIVE 04/22/2017 2039   HGBUR SMALL (A) 04/22/2017 2039   BILIRUBINUR NEGATIVE 04/22/2017 2039   KETONESUR NEGATIVE 04/22/2017 2039   PROTEINUR NEGATIVE 04/22/2017 2039   NITRITE NEGATIVE 04/22/2017 2039   LEUKOCYTESUR LARGE (A) 04/22/2017 2039    Radiological Exams on Admission: DG Chest Port 1 View  Result Date: 07/01/2020 CLINICAL DATA:  Shortness of breath, cough and fever. Symptoms began 3 days ago. EXAM: PORTABLE CHEST 1 VIEW COMPARISON:  11/14/2018 FINDINGS: Chronic cardiomegaly. Chronic aortic atherosclerosis. Chronic  scoliosis with chest deformity subsequent to that. Areas of patchy atelectasis and or infiltrate at both lung bases. Upper lungs appear clear. IMPRESSION: Patchy atelectasis or infiltrate at both lung bases. Cardiomegaly. Aortic atherosclerosis. Electronically Signed   By: Nelson Chimes M.D.   On: 07/01/2020 15:40    EKG: Independently reviewed.  Normal sinus rhythm.  No ST elevation or depression noted.  Assessment/Plan Principal Problem:   Pneumonia due to COVID-19 virus Active Problems:   GERD (gastroesophageal reflux disease)   Acute respiratory failure with hypoxia ([facility])   Acute kidney injury ([place])   Macrocytic anemia   Thrombocytopenia ([facility])   Acute hypoxemic respiratory failure due to COVID-19 [facility])    Acute hypoxemic respiratory failure secondary to COVID-19 pneumonia: -Patient tested positive for COVID-19.  Reviewed chest x-ray.  She is requiring 3 L of oxygen via nasal cannula. -Admit patient on the floor.  On continuous pulse ox.  We will try to wean off of oxygen as tolerated. -Remdesivir as per pharmacy.  Start on IV Solu-Medrol.  Check inflammatory markers and repeat inflammatory markers tomorrow AM.  P.o. vitamins and antitussive. -Monitor vitals closely. -Patient was told that if COVID-19 pneumonitis gets worse we might potentially use Actemra she understands risk and benefits and wants to proceed with Actemra if required.  She denies previous history of hepatitis or tuberculosis. -Imodium as needed for loose stool.  AKI: -Likely secondary to dehydration.  Avoid nephrotoxic medication.  Repeat BMP tomorrow a.m.  Macrocytic anemia: H&H is stable.  Check B12, folate and TSH.  Monitor H&H closely.  Leukopenia: WBC 3.8. -Likely secondary to COVID-19 infection.  Repeat CBC tomorrow AM.  Thrombocytopenia: Platelet: 144.  No active bleeding.  Likely secondary to COVID-19 infection.  Repeat CBC tomorrow a.m.  History of PE/DVT: His IVC filter.  Continue Eliquis  GERD: Hold  PPI due to thrombocytopenia.  Unable to safely start patient's home medication as med reconciliation is pending by pharmacy.  DVT prophylaxis: Eliquis/SCD Code Status: DNR-confirmed with the  patient  family Communication: None present at bedside.  Plan of care discussed with patient in length and she verbalized understanding and agreed with it. Disposition Plan: Home in 3 to 4 days Consults called: None Admission status: Inpatient   Mckinley Jewel MD [facility]  If 7PM-7AM, please contact night-coverage [email or web address] Password [email or web address]  07/01/2020, 6:30 PM

## 2020-07-01 NOTE — ED Provider Notes (Addendum)
[facility] EMERGENCY DEPARTMENT Provider Note   CSN: [id number] Arrival date & time: 07/01/20  1453     History Chief Complaint  Patient presents with  . COVID test    [name] May is a 79 y.o. female.  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  Patient reports that she is not coughing up any sputum and has not lost her taste or smell.  She states for the last 2 days she was feeling terrible and even when she checked her home oxygen it was in the 70s at times.  When she woke up this morning she was still feeling poorly so she called her doctor and they recommended she come to the hospital.  Patient does have oxygen at home but reports had never gotten out of the packaging until yesterday she did get it out and put it next to her bed but never used it.  She is still been using her inhalers at home and feels like they help a little bit.  She was vaccinated against Covid in January.  She denies any abdominal pain or chest pain.  She has no new leg pain or swelling.  EMS did report the patient was febrile to 100.5 and was given Tylenol.  She was also hypoxic at 86% and was placed on 3 L.  The history is provided by the patient.       Past Medical History:  Diagnosis Date  . Allergy   . Anemia   . Arthritis    left knee  . Back pain   . Bipolar disorder ([place])   . Chronic headaches   . COPD (chronic obstructive pulmonary disease) ([place])   . Degenerative disorder of bone   . Depression   . Esophageal ulcer    history  . GERD (gastroesophageal reflux disease)   . Hyperlipidemia   . Peripheral vascular disease ([place])   . Pulmonary embolism ([place])   . Scoliosis     Patient Active Problem List   Diagnosis Date Noted  . Cellulitis of left lower extremity 04/30/2017  .  Cellulitis 04/30/2017  . Edema of left lower extremity 04/23/2017  . AKI (acute kidney injury) ([place]) 04/23/2017  . ARF (acute renal failure) ([place]) 04/22/2017  . DVT of lower extremity, bilateral ([place]) 02/10/2015  . PE (pulmonary embolism)   . Shortness of breath   . Normocytic anemia 02/07/2015  . DVT (deep venous thrombosis) ([place])   . Acute pulmonary embolism ([place]) 02/06/2015  . Scoliosis of thoracic spine 02/06/2015  . GERD (gastroesophageal reflux disease) 02/06/2015  . Acute respiratory failure with hypoxia ([place]) 02/06/2015  . Acute kidney injury ([place]) 02/06/2015  . Elevated troponin 02/06/2015    Past Surgical History:  Procedure Laterality Date  . ABDOMINAL HYSTERECTOMY    . ANKLE SURGERY    . COLONOSCOPY    . EYE SURGERY     caratacs  . FEMORAL ARTERY EXPLORATION  05/16/2017   Procedure: STENT OF BILATERAL COMMON FEMORAL VEIN WITH 16 X 90 WALL STENT;  Surgeon: Waynetta Sandy, MD;  Location: [facility];  Service: Vascular;;  . hemroidectomy    . INSERTION OF ILIAC STENT Bilateral 05/16/2017   Procedure: STENT OF BILATERAL COMMON AND EXTERNAL ILIAC VEIN WITH 18 X 90 WALL STENT;  Surgeon: Servando Snare  Harrell Gave, MD;  Location: [facility];  Service: Vascular;  Laterality: Bilateral;  . INTRAVASCULAR ULTRASOUND/IVUS N/A 05/06/2017   Procedure: INTRAVASCULAR ULTRASOUND/IVUS;  Surgeon: Waynetta Sandy, MD;  Location: [facility];  Service: Vascular;  Laterality: N/A;  . IVC FILTER INSERTION N/A 05/16/2017   Procedure: MECHANICAL THROMBECTOMY OF IVC FILTER;  Surgeon: Waynetta Sandy, MD;  Location: [facility];  Service: Vascular;  Laterality: N/A;  . LAMINECTOMY    . LOWER EXTREMITY VENOGRAPHY N/A 05/14/2017   Procedure: Lower Extremity Venography;  Surgeon: Serafina Mitchell, MD;  Location: [facility];  Service: Cardiovascular;  Laterality: N/A;  . NASAL SINUS SURGERY    . PERCUTANEOUS VENOUS THROMBECTOMY,LYSIS WITH INTRAVASCULAR ULTRASOUND (IVUS) N/A 05/16/2017    Procedure: PERCUTANEOUS VENOUS THROMBECTOMY AND LYSIS WITH INTRAVASCULAR ULTRASOUND (IVUS);  Surgeon: Waynetta Sandy, MD;  Location: [facility];  Service: Vascular;  Laterality: N/A;  . [place] N/A 05/16/2017   Procedure: RETRIEVAL INFERIOR VENA-CAVA FILTER;  Surgeon: Waynetta Sandy, MD;  Location: [facility];  Service: Vascular;  Laterality: N/A;  . VENOGRAM Bilateral 05/16/2017   Procedure: VENOGRAM OF IVC;  Surgeon: Waynetta Sandy, MD;  Location: [facility];  Service: Vascular;  Laterality: Bilateral;     OB History   No obstetric history on file.     Family History  Problem Relation Age of Onset  . Deep vein thrombosis Neg Hx     Social History   Tobacco Use  . Smoking status: Former Research scientist (life sciences)  . Smokeless tobacco: Never Used  . Tobacco comment: quit 25 years ago   Vaping Use  . Vaping Use: Never used  Substance Use Topics  . Alcohol use: No  . Drug use: No    Home Medications Prior to Admission medications   Medication Sig Start Date End Date Taking? Authorizing Provider  apixaban (ELIQUIS) 2.5 MG TABS tablet TAKE 1 TABLET BY MOUTH TWICE DAILY 07/24/18   [provider]  ascorbic acid (VITAMIN C) 250 MG tablet Take 250 mg by mouth 2 (two) times daily.    [provider]  Besifloxacin HCl 0.6 % SUSP  10/17/16   [provider]  Bromfenac Sodium 0.07 % SOLN  10/17/16   [provider]  buPROPion (WELLBUTRIN XL) 300 MG 24 hr tablet Take 300 mg by mouth daily.  02/25/14   [provider]  Calcium Carbonate-Vitamin D (CALCIUM + D PO) Take 1 tablet by mouth daily.     [provider]  Calcium Carbonate-Vitamin D (CALCIUM 500 + D) 500-125 MG-UNIT TABS Take by mouth.    [provider]  clorazepate (TRANXENE) 3.75 MG tablet  03/18/15   [provider]  dicyclomine (BENTYL) 20 MG tablet Take 20 mg by mouth every 6 (six) hours as needed for spasms.    [provider]    Difluprednate 0.05 % EMUL  10/17/16   [provider]  furosemide (LASIX) 20 MG tablet TK 1 T PO BID FOR LEG EDEMA 07/08/15   [provider]  gabapentin (NEURONTIN) 600 MG tablet Take 600 mg by mouth 3 (three) times daily. AT 1500, 1800, and 2100 02/25/14   [provider]  HYDROcodone-acetaminophen (NORCO) 10-325 MG tablet Take by mouth. 02/10/18   [provider]  hydrocortisone 2.5 % cream Apply 1 application topically daily as needed (eczema).  01/06/14   [provider]  iron polysaccharides (NU-IRON) 150 MG capsule Take 150 mg by mouth daily.    [provider]  ketoconazole (NIZORAL) 2 % cream Apply 1 application topically 2 (two) times daily. 08/12/18   Hyatt, Max T, DPM  lamoTRIgine (LAMICTAL) 200 MG tablet Take 200 mg by mouth at bedtime.  02/20/14   [provider]  loperamide (IMODIUM A-D) 2 MG tablet Take 4-6 mg by mouth daily as needed for diarrhea or loose stools.    [provider]  loratadine (CLARITIN) 10 MG tablet Take 10 mg by mouth daily as needed (for seasonal allergies).     [provider]  LORazepam (ATIVAN) 0.5 MG tablet Take 0.5 mg by mouth 2 (two) times daily.    [provider]  magnesium oxide (MAG-OX) 400 MG tablet TAKE 1 TABLET(400 MG) BY MOUTH TWICE DAILY 02/26/18   [provider]  MAGNESIUM-OXIDE 400 (241.3 Mg) MG tablet Take 400 mg by mouth 2 (two) times daily. 04/30/17   [provider]  methocarbamol (ROBAXIN) 500 MG tablet Take 1 tablet (500 mg total) by mouth 3 (three) times daily. 05/03/17   Lavina Hamman, MD  oxyCODONE-acetaminophen (PERCOCET/ROXICET) 5-325 MG tablet Take 1-2 tablets by mouth every 3 (three) hours as needed for moderate pain. 05/07/17   Alvia Grove, PA-C  pantoprazole (PROTONIX) 40 MG tablet Take 40 mg by mouth 2 (two) times daily as needed (acid reflux).  01/24/14   [provider]  rOPINIRole (REQUIP) 2 MG tablet TAKE 1 TABLET  BY MOUTH DAILY AT 3 PM, 6 PM, AND 9 PM *SCHEDULE AN APPOINTMENT FOR ADDITIONAL REFILLS* 02/02/15   [provider]  SUMAtriptan (IMITREX) 25 MG tablet Take by mouth every 2 (two) hours as needed.  02/10/18   [provider]  traMADol (ULTRAM) 50 MG tablet Take 50 mg by mouth 2 (two) times daily as needed.  02/10/18   [provider]    Allergies    Latex, Mold extract [trichophyton], Molds & smuts, Vancomycin, and Cephalexin  Review of Systems   Review of Systems  All other systems reviewed and are negative.   Physical Exam Updated Vital Signs BP 118/62 (BP Location: Right Arm)   Pulse 84   Temp 99.9 F (37.7 C) (Oral)   Resp (!) 22   Ht 4\' 9"  (1.448 m)   SpO2 96%   BMI 30.94 kg/m   Physical Exam Vitals and nursing note reviewed.  Constitutional:      General: She is not in acute distress.    Appearance: Normal appearance. She is well-developed and normal weight.  HENT:     Head: Normocephalic and atraumatic.  Eyes:     Pupils: Pupils are equal, round, and reactive to light.  Cardiovascular:     Rate and Rhythm: Normal rate and regular rhythm.     Heart sounds: Normal heart sounds. No murmur heard.  No friction rub.  Pulmonary:     Effort: Pulmonary effort is normal.     Breath sounds: Rales present. No wheezing.     Comments: Fine crackles and rales in the bilateral lower lobes.  Mild tachypnea Abdominal:     General: Bowel sounds are normal. There is no distension.     Palpations: Abdomen is soft.     Tenderness: There is no abdominal tenderness. There is no guarding or rebound.  Musculoskeletal:        General: No tenderness. Normal range of motion.     Comments: No edema.  Severe scoliosis  Skin:    General: Skin is warm and dry.  Capillary Refill: Capillary refill takes less than 2 seconds.     Findings: No rash.  Neurological:     General: No focal deficit present.     Mental Status: She is alert and oriented to person, place, and  time. Mental status is at baseline.     Cranial Nerves: No cranial nerve deficit.  Psychiatric:        Mood and Affect: Mood normal.        Behavior: Behavior normal.        Thought Content: Thought content normal.     ED Results / Procedures / Treatments   Labs (all labs ordered are listed, but only abnormal results are displayed) Labs Reviewed  SARS CORONAVIRUS 2 BY RT PCR (HOSPITAL ORDER, [facility]) - Abnormal; Notable for the following components:      Result Value   SARS Coronavirus 2 POSITIVE (*)    All other components within normal limits  CBC WITH DIFFERENTIAL/PLATELET - Abnormal; Notable for the following components:   WBC 3.8 (*)    RBC 3.68 (*)    Hemoglobin 11.4 (*)    MCV 101.4 (*)    Platelets 144 (*)    All other components within normal limits  BASIC METABOLIC PANEL - Abnormal; Notable for the following components:   Creatinine, Ser 1.10 (*)    Calcium 8.2 (*)    GFR calc non Af Amer 48 (*)    GFR calc Af Amer 56 (*)    All other components within normal limits  URINALYSIS, ROUTINE W REFLEX MICROSCOPIC - Abnormal; Notable for the following components:   Leukocytes,Ua MODERATE (*)    All other components within normal limits  D-DIMER, QUANTITATIVE (NOT AT [facility]) - Abnormal; Notable for the following components:   D-Dimer, Quant 0.55 (*)    All other components within normal limits  LACTATE DEHYDROGENASE - Abnormal; Notable for the following components:   LDH 193 (*)    All other components within normal limits  VITAMIN B12 - Abnormal; Notable for the following components:   Vitamin B-12 105 (*)    All other components within normal limits  CULTURE, BLOOD (SINGLE)  URINE CULTURE  LACTIC ACID, PLASMA  PROTIME-INR  APTT  BRAIN NATRIURETIC PEPTIDE  C-REACTIVE PROTEIN  FERRITIN  FIBRINOGEN  HEPATITIS B SURFACE ANTIGEN  TSH  FOLATE  PROCALCITONIN  CBC WITH DIFFERENTIAL/PLATELET  COMPREHENSIVE METABOLIC PANEL  C-REACTIVE  PROTEIN  D-DIMER, QUANTITATIVE (NOT AT [facility])  FERRITIN  MAGNESIUM  PHOSPHORUS  TROPONIN I (HIGH SENSITIVITY)  TROPONIN I (HIGH SENSITIVITY)    EKG EKG Interpretation  Date/Time:  Friday July 01 2020 16:15:13 EDT Ventricular Rate:  76 PR Interval:    QRS Duration: 93 QT Interval:  374 QTC Calculation: 421 R Axis:   51 Text Interpretation: Sinus rhythm Normal ECG No significant change since last tracing Confirmed by Blanchie Dessert ([id number]) on 07/01/2020 4:41:51 PM   Radiology DG Chest Port 1 View  Result Date: 07/01/2020 CLINICAL DATA:  Shortness of breath, cough and fever. Symptoms began 3 days ago. EXAM: PORTABLE CHEST 1 VIEW COMPARISON:  11/14/2018 FINDINGS: Chronic cardiomegaly. Chronic aortic atherosclerosis. Chronic scoliosis with chest deformity subsequent to that. Areas of patchy atelectasis and or infiltrate at both lung bases. Upper lungs appear clear. IMPRESSION: Patchy atelectasis or infiltrate at both lung bases. Cardiomegaly. Aortic atherosclerosis. Electronically Signed   By: Nelson Chimes M.D.   On: 07/01/2020 15:40    Procedures Procedures (including critical care time)  Medications Ordered in ED Medications - No data to display  ED Course  I have reviewed the triage vital signs and the nursing notes.  Pertinent labs & imaging results that were available during my care of the patient were reviewed by me and considered in my medical decision making (see chart for details).    MDM Rules/Calculators/A&P                          Patient is an elderly female presenting with URI symptoms as well as fever and shortness of breath.  Patient is febrile, hypoxic and tachypneic.  She does meet sepsis criteria but is well-appearing with normal blood pressure.  Concern for possible breakthrough Covid even though patient has been vaccinated.  Currently she is satting 95 to 98% on 3 L.  Also concern for possible pneumonia.  Low suspicion for recurrent PE or ACS.  No  evidence of CHF at this time.  Patient denies any urinary symptoms and reports that the diarrhea she has is not always abnormal and she gets that frequently even when she feels fine.  She has no abdominal pain concerning for an acute abdominal process.  CBC, BMP, chest x-ray and Covid is pending  4:14 PM After arrival about 1 hr pt became slightly hypotensive with BP of 90/59 with stable maps but evolving sepsis was initiated.  CBC with leukopenia of 3.8.  Hb and platelets stable.  Sats remain stable at mid 90's on O2.  CXR with atelectasis vs infiltrate.    6:18 PM COVID is positive and given pt is now requiring 3L of  will admit for IV decadron and remdesivir.  MDM Number of Diagnoses or Management Options   Amount and/or Complexity of Data Reviewed Clinical lab tests: ordered and reviewed Tests in the radiology section of CPT: ordered and reviewed Tests in the medicine section of CPT: ordered and reviewed Decide to obtain previous medical records or to obtain history from someone other than the patient: yes Obtain history from someone other than the patient: yes Review and summarize past medical records: yes Discuss the patient with other providers: yes Independent visualization of images, tracings, or specimens: yes  Risk of Complications, Morbidity, and/or Mortality Presenting problems: moderate Diagnostic procedures: low Management options: low  Patient Progress Patient progress: stable  Heba Ige was evaluated in Emergency Department on 07/01/2020 for the symptoms described in the history of present illness. She was evaluated in the context of the global COVID-19 pandemic, which necessitated consideration that the patient might be at risk for infection with the SARS-CoV-2 virus that causes COVID-19. Institutional protocols and algorithms that pertain to the evaluation of patients at risk for COVID-19 are in a state of rapid change based on information released by  regulatory bodies including the CDC and federal and state organizations. These policies and algorithms were followed during the patient's care in the ED.   Final Clinical Impression(s) / ED Diagnoses Final diagnoses:  Pneumonia due to COVID-19 virus  Hypoxia    Rx / DC Orders ED Discharge Orders    None       Blanchie Dessert, MD 07/01/20 Achille Rich, MD 07/01/20 [id number]

## 2020-07-01 NOTE — ED Notes (Signed)
Pt had episode of emesis.  

## 2020-07-02 DIAGNOSIS — J9601 Acute respiratory failure with hypoxia: Secondary | ICD-10-CM

## 2020-07-02 DIAGNOSIS — D539 Nutritional anemia, unspecified: Secondary | ICD-10-CM

## 2020-07-02 DIAGNOSIS — K219 Gastro-esophageal reflux disease without esophagitis: Secondary | ICD-10-CM

## 2020-07-02 DIAGNOSIS — D696 Thrombocytopenia, unspecified: Secondary | ICD-10-CM

## 2020-07-02 DIAGNOSIS — N179 Acute kidney failure, unspecified: Secondary | ICD-10-CM

## 2020-07-02 LAB — COMPREHENSIVE METABOLIC PANEL
ALT: 14 U/L (ref 0–44)
AST: 29 U/L (ref 15–41)
Albumin: 2.7 g/dL — ABNORMAL LOW (ref 3.5–5.0)
Alkaline Phosphatase: 39 U/L (ref 38–126)
Anion gap: 12 (ref 5–15)
BUN: 18 mg/dL (ref 8–23)
CO2: 15 mmol/L — ABNORMAL LOW (ref 22–32)
Calcium: 8.1 mg/dL — ABNORMAL LOW (ref 8.9–10.3)
Chloride: 109 mmol/L (ref 98–111)
Creatinine, Ser: 0.99 mg/dL (ref 0.44–1.00)
GFR calc Af Amer: >60 mL/min (ref >60)
GFR calc non Af Amer: 55 mL/min — ABNORMAL LOW (ref >60)
Glucose, Bld: 171 mg/dL — ABNORMAL HIGH (ref 70–99)
Potassium: 4.7 mmol/L (ref 3.5–5.1)
Sodium: 136 mmol/L (ref 135–145)
Total Bilirubin: 0.5 mg/dL (ref 0.3–1.2)
Total Protein: 6.9 g/dL (ref 6.5–8.1)

## 2020-07-02 LAB — CBC WITH DIFFERENTIAL/PLATELET
Abs Immature Granulocytes: 0 10*3/uL (ref 0.00–0.07)
Basophils Absolute: 0 10*3/uL (ref 0.0–0.1)
Basophils Relative: 1 %
Eosinophils Absolute: 0 10*3/uL (ref 0.0–0.5)
Eosinophils Relative: 0 %
HCT: 37.1 % (ref 36.0–46.0)
Hemoglobin: 11.5 g/dL — ABNORMAL LOW (ref 12.0–15.0)
Immature Granulocytes: 0 %
Lymphocytes Relative: 22 %
Lymphs Abs: 0.4 10*3/uL — ABNORMAL LOW (ref 0.7–4.0)
MCH: 31.4 pg (ref 26.0–34.0)
MCHC: 31 g/dL (ref 30.0–36.0)
MCV: 101.4 fL — ABNORMAL HIGH (ref 80.0–100.0)
Monocytes Absolute: 0 10*3/uL — ABNORMAL LOW (ref 0.1–1.0)
Monocytes Relative: 2 %
Neutro Abs: 1.3 10*3/uL — ABNORMAL LOW (ref 1.7–7.7)
Neutrophils Relative %: 75 %
Platelets: 144 10*3/uL — ABNORMAL LOW (ref 150–400)
RBC: 3.66 MIL/uL — ABNORMAL LOW (ref 3.87–5.11)
RDW: 13.4 % (ref 11.5–15.5)
WBC: 1.8 10*3/uL — ABNORMAL LOW (ref 4.0–10.5)
nRBC: 0 % (ref 0.0–0.2)

## 2020-07-02 LAB — FERRITIN: Ferritin: 81 ng/mL (ref 11–307)

## 2020-07-02 LAB — MAGNESIUM: Magnesium: UNDETERMINED mg/dL (ref 1.7–2.4)

## 2020-07-02 LAB — PHOSPHORUS: Phosphorus: 2.9 mg/dL (ref 2.5–4.6)

## 2020-07-02 LAB — TROPONIN I (HIGH SENSITIVITY): Troponin I (High Sensitivity): 6 ng/L (ref <18)

## 2020-07-02 LAB — D-DIMER, QUANTITATIVE: D-Dimer, Quant: 0.52 ug/mL-FEU — ABNORMAL HIGH (ref 0.00–0.50)

## 2020-07-02 LAB — C-REACTIVE PROTEIN: CRP: 0.5 mg/dL (ref <1.0)

## 2020-07-02 MED ORDER — PRAMIPEXOLE DIHYDROCHLORIDE 0.25 MG PO TABS
0.5000 mg | ORAL_TABLET | Freq: Every day | ORAL | Status: DC | PRN
  Filled 2020-07-02 (×2): qty 2

## 2020-07-02 MED ORDER — ALBUTEROL SULFATE HFA 108 (90 BASE) MCG/ACT IN AERS
2.0000 | INHALATION_SPRAY | Freq: Four times a day (QID) | RESPIRATORY_TRACT | Status: DC | PRN
  Filled 2020-07-02: qty 6.7

## 2020-07-02 MED ORDER — PRAMIPEXOLE DIHYDROCHLORIDE 0.25 MG PO TABS
0.5000 mg | ORAL_TABLET | Freq: Every day | ORAL | Status: DC
  Administered 2020-07-02 – 2020-07-05 (×4): 0.5 mg via ORAL
  Filled 2020-07-02 (×6): qty 2

## 2020-07-02 MED ORDER — LAMOTRIGINE 100 MG PO TABS
200.0000 mg | ORAL_TABLET | Freq: Every day | ORAL | Status: DC
  Administered 2020-07-02 – 2020-07-05 (×4): 200 mg via ORAL
  Filled 2020-07-02 (×5): qty 2

## 2020-07-02 MED ORDER — GABAPENTIN 300 MG PO CAPS
600.0000 mg | ORAL_CAPSULE | Freq: Once | ORAL | Status: AC
  Administered 2020-07-02: 600 mg via ORAL
  Filled 2020-07-02: qty 2

## 2020-07-02 MED ORDER — BUPROPION HCL ER (XL) 150 MG PO TB24
300.0000 mg | ORAL_TABLET | Freq: Every morning | ORAL | Status: DC
  Administered 2020-07-02 – 2020-07-06 (×5): 300 mg via ORAL
  Filled 2020-07-02 (×5): qty 2

## 2020-07-02 MED ORDER — HYDROCODONE-ACETAMINOPHEN 10-325 MG PO TABS
0.5000 | ORAL_TABLET | Freq: Every day | ORAL | Status: DC | PRN
  Administered 2020-07-05: 1 via ORAL
  Filled 2020-07-02: qty 1

## 2020-07-02 MED ORDER — SUMATRIPTAN SUCCINATE 50 MG PO TABS
50.0000 mg | ORAL_TABLET | Freq: Once | ORAL | Status: DC | PRN
  Filled 2020-07-02: qty 1

## 2020-07-02 MED ORDER — GABAPENTIN 600 MG PO TABS
600.0000 mg | ORAL_TABLET | ORAL | Status: DC
  Administered 2020-07-02 – 2020-07-06 (×9): 600 mg via ORAL
  Filled 2020-07-02 (×10): qty 1

## 2020-07-02 MED ORDER — LOPERAMIDE HCL 2 MG PO TABS: 2.0000 mg | ORAL_TABLET | Freq: Four times a day (QID) | ORAL | Status: DC | PRN

## 2020-07-02 MED ORDER — ASCORBIC ACID 500 MG PO TABS
500.0000 mg | ORAL_TABLET | Freq: Every day | ORAL | Status: DC
  Administered 2020-07-02 – 2020-07-06 (×5): 500 mg via ORAL
  Filled 2020-07-02 (×5): qty 1

## 2020-07-02 MED ORDER — ACETAMINOPHEN ER 650 MG PO TBCR: 1300.0000 mg | EXTENDED_RELEASE_TABLET | Freq: Three times a day (TID) | ORAL | Status: DC | PRN

## 2020-07-02 MED ORDER — PANTOPRAZOLE SODIUM 40 MG PO TBEC
40.0000 mg | DELAYED_RELEASE_TABLET | Freq: Two times a day (BID) | ORAL | Status: DC
  Administered 2020-07-02 – 2020-07-06 (×9): 40 mg via ORAL
  Filled 2020-07-02 (×9): qty 1

## 2020-07-02 MED ORDER — UMECLIDINIUM BROMIDE 62.5 MCG/INH IN AEPB
1.0000 | INHALATION_SPRAY | Freq: Every evening | RESPIRATORY_TRACT | Status: DC
  Administered 2020-07-02 – 2020-07-06 (×5): 1 via RESPIRATORY_TRACT
  Filled 2020-07-02: qty 7

## 2020-07-02 MED ORDER — METHOCARBAMOL 500 MG PO TABS
500.0000 mg | ORAL_TABLET | Freq: Three times a day (TID) | ORAL | Status: DC
  Administered 2020-07-02 – 2020-07-06 (×14): 500 mg via ORAL
  Filled 2020-07-02 (×13): qty 1

## 2020-07-02 MED ORDER — TRAMADOL HCL 50 MG PO TABS: 50.0000 mg | ORAL_TABLET | Freq: Two times a day (BID) | ORAL | Status: DC | PRN

## 2020-07-02 MED ORDER — POLYSACCHARIDE IRON COMPLEX 150 MG PO CAPS
150.0000 mg | ORAL_CAPSULE | Freq: Two times a day (BID) | ORAL | Status: DC
  Administered 2020-07-02 – 2020-07-06 (×10): 150 mg via ORAL
  Filled 2020-07-02 (×12): qty 1

## 2020-07-02 MED ORDER — KETOTIFEN FUMARATE 0.025 % OP SOLN
1.0000 [drp] | Freq: Three times a day (TID) | OPHTHALMIC | Status: DC | PRN
  Filled 2020-07-02: qty 5

## 2020-07-02 MED ORDER — LORAZEPAM 0.5 MG PO TABS
0.5000 mg | ORAL_TABLET | Freq: Two times a day (BID) | ORAL | Status: DC
  Administered 2020-07-02 – 2020-07-06 (×9): 0.5 mg via ORAL
  Filled 2020-07-02 (×9): qty 1

## 2020-07-02 MED ORDER — DICYCLOMINE HCL 20 MG PO TABS
20.0000 mg | ORAL_TABLET | Freq: Four times a day (QID) | ORAL | Status: DC | PRN
  Filled 2020-07-02: qty 1

## 2020-07-02 MED ORDER — APIXABAN 2.5 MG PO TABS
2.5000 mg | ORAL_TABLET | Freq: Two times a day (BID) | ORAL | Status: DC
  Administered 2020-07-02 – 2020-07-06 (×9): 2.5 mg via ORAL
  Filled 2020-07-02 (×10): qty 1

## 2020-07-02 MED ORDER — LORATADINE 10 MG PO TABS
10.0000 mg | ORAL_TABLET | Freq: Every day | ORAL | Status: DC
  Administered 2020-07-02 – 2020-07-06 (×5): 10 mg via ORAL
  Filled 2020-07-02 (×5): qty 1

## 2020-07-02 MED ORDER — PROMETHAZINE HCL 25 MG PO TABS: 25.0000 mg | ORAL_TABLET | Freq: Three times a day (TID) | ORAL | Status: DC | PRN

## 2020-07-02 MED ORDER — MAGNESIUM OXIDE 400 (241.3 MG) MG PO TABS
400.0000 mg | ORAL_TABLET | Freq: Every day | ORAL | Status: DC
  Administered 2020-07-02 – 2020-07-06 (×5): 400 mg via ORAL
  Filled 2020-07-02 (×4): qty 1

## 2020-07-02 MED ORDER — APIXABAN 2.5 MG PO TABS
2.5000 mg | ORAL_TABLET | Freq: Two times a day (BID) | ORAL | Status: DC
  Filled 2020-07-02: qty 1

## 2020-07-02 NOTE — ED Notes (Signed)
Pt ambulatory to bedside commode w/o difficulty. Pt eating lunch.

## 2020-07-02 NOTE — ED Notes (Signed)
In to see pt. Pt very angry that she was not given her nightly dose of Eliquis. Explained to pt that this med has not been ordered. Talked with pt about this and why this could have happened and what we will do to fix this problem. Apologized to pt multiple times and promised this will get fixed today.

## 2020-07-02 NOTE — ED Notes (Signed)
Dinner tray provided

## 2020-07-02 NOTE — ED Notes (Signed)
Lunch tray given. 

## 2020-07-02 NOTE — ED Notes (Signed)
Coffee given x 2 cups as requested. Pt aware diet changed from NPO to Regular.

## 2020-07-02 NOTE — ED Notes (Signed)
Received bedside report from [facility]. Pt resting comfortably at this time with no complaints. Pt aware she has meds due and this RN will return to admin. Stretcher low and locked; side rails up x2. Call bell within reach. NAD noted. Will cont to monitor.

## 2020-07-02 NOTE — ED Notes (Signed)
Pt talking on cell phone w/o difficulty.

## 2020-07-02 NOTE — Progress Notes (Signed)
PROGRESS NOTE  [name] May ZOX:[id number] DOB: [birth year] DOA: 07/01/2020 PCP: Jefm Petty, MD   LOS: 1 day   Brief Narrative / Interim history: [name] May is a 79 y.o. 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  She was diagnosed with COVID-19 and a chest x-ray showed bibasilar infiltrates.  She is vaccinated for Covid with the last dose mid-February  Subjective / 24h Interval events: She is quite upset this morning about not receiving her Eliquis last night.  States that her breathing is a little bit better.  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  She may be a candidate for discharge on Sunday to finish remdesivir as an outpatient   COVID-19 Labs  Recent Labs    07/01/20 2046 07/02/20 0305  DDIMER 0.55* 0.52*  FERRITIN 88 81  LDH 193*  --   CRP 0.5 0.5    Lab Results  Component Value Date   SARSCOV2NAA POSITIVE (A) 07/01/2020    Active Problems Acute kidney injury-improved with fluids, creatinine has normalized  Pancytopenia-likely in the setting of acute viral illness   History of PE/DVT-has an IVC filter, continue Eliquis  Tobacco abuse, in remission, possible COPD-quit long time ago, probably has a degree of COPD given chronic hypoxia.  She also has kyphosis suggesting a restrictive process as well  Restless leg syndrome / chronic pain / migraine headache / other medical problems - continue  home medications   Scheduled Meds: . albuterol  2 puff Inhalation Q6H  . apixaban  2.5 mg Oral BID  . ascorbic acid  500 mg Oral Daily  . buPROPion  300 mg Oral q AM  . gabapentin  600 mg Oral Once  . gabapentin  600 mg Oral 2 times per day  . iron polysaccharides  150 mg Oral BID WC  . lamoTRIgine  200 mg Oral QHS  . loratadine  10 mg Oral Daily  . LORazepam  0.5 mg Oral BID  . magnesium oxide  400 mg Oral Q breakfast  . methocarbamol  500 mg Oral TID  . methylPREDNISolone (SOLU-MEDROL) injection  0.5 mg/kg Intravenous Q12H  . pantoprazole  40 mg Oral BID  . pramipexole  0.5 mg Oral QHS  . umeclidinium bromide  1 puff Inhalation QPM  . zinc sulfate  220 mg Oral Daily   Continuous Infusions: . remdesivir 100 mg in NS 100 mL     PRN Meds:.acetaminophen, albuterol, chlorpheniramine-HYDROcodone, dicyclomine, guaiFENesin-dextromethorphan, HYDROcodone-acetaminophen, ketotifen, loperamide, ondansetron **OR** ondansetron (ZOFRAN) IV, pramipexole, promethazine, SUMAtriptan, traMADol  DVT prophylaxis: Eliquis Code Status: Full code Family Communication: no family at bedside   Status is: Inpatient   Remains inpatient appropriate because:Inpatient level of care appropriate due to severity of illness   Dispo: The patient is from: Home              Anticipated d/c is to: Home              Anticipated d/c date is: 1  day              Patient currently is not medically stable to d/c.  Consultants:  None   Procedures:  None   Microbiology: None   Antibacterials: None    Objective: Vitals:   07/02/20 0815 07/02/20 0830 07/02/20 0845 07/02/20 0900  BP:    113/67  Pulse: 93 72 70 75  Resp: (!) 21 20 16 14   Temp:      TempSrc:      SpO2: 90% 92% 93% 94%  Weight:      Height:        Intake/Output Summary (Last 24 hours) at 07/02/2020 1004 Last data filed at 07/01/2020 2040 Gross per 24 hour  Intake 476.7 ml  Output 500 ml  Net -23.3 ml   Filed Weights   07/01/20  1630  Weight: 68 kg    Examination:  Constitutional: NAD Eyes: no scleral icterus ENMT: Mucous membranes are moist.  Neck: normal, supple Respiratory: Overall distant breath sounds, no wheezing heard.  Tachypneic Cardiovascular: Regular rate and rhythm, no murmurs / rubs / gallops. Abdomen: non distended, no tenderness. Bowel sounds positive.  Musculoskeletal: no clubbing / cyanosis.  Skin: no rashes Neurologic: Nonfocal  Data Reviewed: I have independently reviewed following labs and imaging studies   CBC: Recent Labs  Lab 07/01/20 1521 07/02/20 0305  WBC 3.8* 1.8*  NEUTROABS 2.3 1.3*  HGB 11.4* 11.5*  HCT 37.3 37.1  MCV 101.4* 101.4*  PLT 144* 626*   Basic Metabolic Panel: Recent Labs  Lab 07/01/20 1521 07/02/20 0305  NA 137 136  K 3.8 4.7  CL 103 109  CO2 24 15*  GLUCOSE 96 171*  BUN 21 18  CREATININE 1.10* 0.99  CALCIUM 8.2* 8.1*  MG  --  QUANTITY NOT SUFFICIENT, UNABLE TO PERFORM TEST  PHOS  --  2.9   GFR: Estimated Creatinine Clearance: 37.3 mL/min (by C-G formula based on SCr of 0.99 mg/dL). Liver Function Tests: Recent Labs  Lab 07/02/20 0305  AST 29  ALT 14  ALKPHOS 39  BILITOT 0.5  PROT 6.9  ALBUMIN 2.7*   No results for input(s): LIPASE, AMYLASE in the last 168 hours. No results for input(s): AMMONIA in the last 168 hours. Coagulation Profile: Recent Labs  Lab 07/01/20 2047  INR 1.2   Cardiac Enzymes: No results for input(s): CKTOTAL, CKMB, CKMBINDEX, TROPONINI in the last 168 hours. BNP (last 3 results) No results for input(s): PROBNP in the last 8760 hours. HbA1C: No results for input(s): HGBA1C in the last 72 hours. CBG: No results for input(s): GLUCAP in the last 168 hours. Lipid Profile: No results for input(s): CHOL, HDL, LDLCALC, TRIG, CHOLHDL, LDLDIRECT in the last 72 hours. Thyroid Function Tests: Recent Labs    07/01/20 2047  TSH 2.604   Anemia Panel: Recent Labs    07/01/20 2046 07/01/20 2047 07/02/20 0305    VITAMINB12 105*  --   --   FOLATE  --  12.1  --   FERRITIN 88  --  81   Urine analysis:    Component Value Date/Time   COLORURINE YELLOW 07/01/2020 2054   APPEARANCEUR CLEAR 07/01/2020 2054   LABSPEC 1.012 07/01/2020 2054   PHURINE 5.0 07/01/2020 2054   GLUCOSEU NEGATIVE 07/01/2020 2054   HGBUR NEGATIVE 07/01/2020 2054   [place] NEGATIVE 07/01/2020 2054   [place] NEGATIVE 07/01/2020 2054   PROTEINUR NEGATIVE 07/01/2020 2054   NITRITE NEGATIVE 07/01/2020 2054   LEUKOCYTESUR MODERATE (A) 07/01/2020 2054  Sepsis Labs: Invalid input(s): PROCALCITONIN, LACTICIDVEN  Recent Results (from the past 240 hour(s))  SARS Coronavirus 2 by RT PCR (hospital order, performed in [facility] hospital lab) Nasopharyngeal Nasopharyngeal Swab     Status: Abnormal   Collection Time: 07/01/20  3:00 PM   Specimen: Nasopharyngeal Swab  Result Value Ref Range Status   SARS Coronavirus 2 POSITIVE (A) NEGATIVE Final    Comment: RESULT CALLED TO, READ BACK BY AND VERIFIED WITH: E DIXON RN 07/01/20 AT 1757 SK (NOTE) SARS-CoV-2 target nucleic acids are DETECTED  SARS-CoV-2 RNA is generally detectable in upper respiratory specimens  during the acute phase of infection.  Positive results are indicative  of the presence of the identified virus, but do not rule out bacterial infection or co-infection with other pathogens not detected by the test.  Clinical correlation with patient history and  other diagnostic information is necessary to determine patient infection status.  The expected result is negative.  Fact Sheet for Patients:   StrictlyIdeas.no   Fact Sheet for Healthcare Providers:   BankingDealers.co.za    This test is not yet approved or cleared by the [place] FDA and  has been authorized for detection and/or diagnosis of SARS-CoV-2 by FDA under an Emergency Use Authorization (EUA).  This EUA will remain in effect (meaning this test  ca n be used) for the duration of  the COVID-19 declaration under Section 564(b)(1) of the Act, 21 U.S.C. section 360-bbb-3(b)(1), unless the authorization is terminated or revoked sooner.  Performed at [facility], [place] [name] [place], [place] [address]       Radiology Studies: DG Chest Port 1 View  Result Date: 07/01/2020 CLINICAL DATA:  Shortness of breath, cough and fever. Symptoms began 3 days ago. EXAM: PORTABLE CHEST 1 VIEW COMPARISON:  11/14/2018 FINDINGS: Chronic cardiomegaly. Chronic aortic atherosclerosis. Chronic scoliosis with chest deformity subsequent to that. Areas of patchy atelectasis and or infiltrate at both lung bases. Upper lungs appear clear. IMPRESSION: Patchy atelectasis or infiltrate at both lung bases. Cardiomegaly. Aortic atherosclerosis. Electronically Signed   By: Nelson Chimes M.D.   On: 07/01/2020 15:40    Marzetta Board, MD, PhD [facility]  Between 7 am - 7 pm I am available, please contact me via Amion or Securechat  Between 7 pm - 7 am I am not available, please contact night coverage MD/APP via Amion

## 2020-07-03 LAB — CBC WITH DIFFERENTIAL/PLATELET
Abs Immature Granulocytes: 0.02 10*3/uL (ref 0.00–0.07)
Basophils Absolute: 0 10*3/uL (ref 0.0–0.1)
Basophils Relative: 0 %
Eosinophils Absolute: 0 10*3/uL (ref 0.0–0.5)
Eosinophils Relative: 0 %
HCT: 42.7 % (ref 36.0–46.0)
Hemoglobin: 13 g/dL (ref 12.0–15.0)
Immature Granulocytes: 0 %
Lymphocytes Relative: 19 %
Lymphs Abs: 0.9 10*3/uL (ref 0.7–4.0)
MCH: 31 pg (ref 26.0–34.0)
MCHC: 30.4 g/dL (ref 30.0–36.0)
MCV: 101.7 fL — ABNORMAL HIGH (ref 80.0–100.0)
Monocytes Absolute: 0.2 10*3/uL (ref 0.1–1.0)
Monocytes Relative: 4 %
Neutro Abs: 3.8 10*3/uL (ref 1.7–7.7)
Neutrophils Relative %: 77 %
Platelets: 172 10*3/uL (ref 150–400)
RBC: 4.2 MIL/uL (ref 3.87–5.11)
RDW: 13.3 % (ref 11.5–15.5)
WBC: 5 10*3/uL (ref 4.0–10.5)
nRBC: 0 % (ref 0.0–0.2)

## 2020-07-03 LAB — COMPREHENSIVE METABOLIC PANEL
ALT: 20 U/L (ref 0–44)
AST: 31 U/L (ref 15–41)
Albumin: 3.2 g/dL — ABNORMAL LOW (ref 3.5–5.0)
Alkaline Phosphatase: 41 U/L (ref 38–126)
Anion gap: 8 (ref 5–15)
BUN: 15 mg/dL (ref 8–23)
CO2: 28 mmol/L (ref 22–32)
Calcium: 8.9 mg/dL (ref 8.9–10.3)
Chloride: 103 mmol/L (ref 98–111)
Creatinine, Ser: 0.91 mg/dL (ref 0.44–1.00)
GFR calc Af Amer: >60 mL/min (ref >60)
GFR calc non Af Amer: >60 mL/min (ref >60)
Glucose, Bld: 145 mg/dL — ABNORMAL HIGH (ref 70–99)
Potassium: 4.1 mmol/L (ref 3.5–5.1)
Sodium: 139 mmol/L (ref 135–145)
Total Bilirubin: 0.3 mg/dL (ref 0.3–1.2)
Total Protein: 8 g/dL (ref 6.5–8.1)

## 2020-07-03 LAB — URINE CULTURE

## 2020-07-03 LAB — MAGNESIUM: Magnesium: 2 mg/dL (ref 1.7–2.4)

## 2020-07-03 LAB — D-DIMER, QUANTITATIVE: D-Dimer, Quant: 0.55 ug/mL-FEU — ABNORMAL HIGH (ref 0.00–0.50)

## 2020-07-03 LAB — C-REACTIVE PROTEIN: CRP: 0.6 mg/dL (ref <1.0)

## 2020-07-03 LAB — PHOSPHORUS: Phosphorus: 4.2 mg/dL (ref 2.5–4.6)

## 2020-07-03 LAB — FERRITIN: Ferritin: 90 ng/mL (ref 11–307)

## 2020-07-03 MED ORDER — TRAMADOL HCL 50 MG PO TABS: 50.0000 mg | ORAL_TABLET | Freq: Two times a day (BID) | ORAL | Status: DC | PRN

## 2020-07-03 MED ORDER — PROMETHAZINE HCL 25 MG PO TABS: 25.0000 mg | ORAL_TABLET | Freq: Three times a day (TID) | ORAL | Status: DC | PRN

## 2020-07-03 NOTE — Progress Notes (Signed)
PROGRESS NOTE  [name] May:[id number] DOB: [birth year] DOA: 07/01/2020 PCP: Jefm Petty, MD   LOS: 2 days   Brief Narrative / Interim history: [name] May is a 79 y.o. 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  She was diagnosed with COVID-19 and a chest x-ray showed bibasilar infiltrates.  She is vaccinated for Covid with the last dose mid-February  Subjective / 24h Interval events: She is doing well, her breathing is good at rest but feels a little bit tight when she goes to the bedside commode and back.  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 -She still complains of shortness of breath outside her normal and more chest tightness with coughing, continue to monitor in the inpatient setting   COVID-19 Labs  Recent Labs    07/01/20 2046 07/02/20 0305 07/03/20 0500  DDIMER 0.55* 0.52* 0.55*  FERRITIN 88 81 90  LDH 193*  --   --   CRP 0.5 0.5 0.6    Lab Results  Component Value Date   SARSCOV2NAA POSITIVE (A) 07/01/2020    Active Problems Acute kidney injury-improved with fluids, creatinine is normal this morning  Pancytopenia-likely in the setting of acute viral illness, all cell lines improved today  History of PE/DVT-has an IVC filter, continue Eliquis  Tobacco abuse, in remission, possible COPD-quit long time ago, probably has a degree of COPD given chronic hypoxia.  She also has  kyphosis suggesting a restrictive process as well  Restless leg syndrome / chronic pain / migraine headache / other medical problems - continue home medications  This is Dr. Cruzita Lederer Scheduled Meds: . albuterol  2 puff Inhalation Q6H  . apixaban  2.5 mg Oral BID  . ascorbic acid  500 mg Oral Daily  . buPROPion  300 mg Oral q AM  . gabapentin  600 mg Oral 2 times per day  . iron polysaccharides  150 mg Oral BID WC  . lamoTRIgine  200 mg Oral QHS  . loratadine  10 mg Oral Daily  . LORazepam  0.5 mg Oral BID  . magnesium oxide  400 mg Oral Q breakfast  . methocarbamol  500 mg Oral TID  . methylPREDNISolone (SOLU-MEDROL) injection  0.5 mg/kg Intravenous Q12H  . pantoprazole  40 mg Oral BID  . pramipexole  0.5 mg Oral QHS  . umeclidinium bromide  1 puff Inhalation QPM  . zinc sulfate  220 mg Oral Daily   Continuous Infusions: . remdesivir 100 mg in NS 100 mL Stopped (07/02/20 1220)   PRN Meds:.acetaminophen, albuterol, chlorpheniramine-HYDROcodone, dicyclomine, guaiFENesin-dextromethorphan, HYDROcodone-acetaminophen, ketotifen, loperamide, ondansetron **OR** ondansetron (ZOFRAN) IV, pramipexole, promethazine, SUMAtriptan, traMADol  DVT prophylaxis: Eliquis Code Status: Full code Family Communication: no family at bedside, called and discussed with the daughter over the phone  Status is: Inpatient   Remains inpatient appropriate because:Inpatient level of care appropriate due to severity of illness  Dispo: The patient is from: Home  Anticipated d/c is to: Home              Anticipated d/c date is: 2 days              Patient currently is not medically stable to d/c.  Consultants:  None   Procedures:  None   Microbiology: None   Antibacterials: None    Objective: Vitals:   07/03/20 0730 07/03/20 0745 07/03/20 0800 07/03/20 0815  BP:   (!) 102/56   Pulse: (!) 59 (!) 58 (!) 57 62  Resp: 14 14 15 17   Temp:      TempSrc:      SpO2: 94% 93% 95% 94%    Weight:      Height:        Intake/Output Summary (Last 24 hours) at 07/03/2020 1123 Last data filed at 07/02/2020 1318 Gross per 24 hour  Intake --  Output 600 ml  Net -600 ml   Filed Weights   07/01/20 1630  Weight: 68 kg    Examination:  Constitutional: Appears comfortable at rest Eyes: No icterus ENMT: Moist mucous membranes Neck: normal, supple Respiratory: Overall distant breath sounds, no wheezing, tachypneic still Cardiovascular: Regular rate and rhythm, no murmurs Abdomen: Soft, nontender, nondistended, bowel sounds positive Musculoskeletal: no clubbing / cyanosis.  Skin: No rashes appreciated Neurologic: No focal deficits  Data Reviewed: I have independently reviewed following labs and imaging studies   CBC: Recent Labs  Lab 07/01/20 1521 07/02/20 0305 07/03/20 0500  WBC 3.8* 1.8* 5.0  NEUTROABS 2.3 1.3* 3.8  HGB 11.4* 11.5* 13.0  HCT 37.3 37.1 42.7  MCV 101.4* 101.4* 101.7*  PLT 144* 144* 277   Basic Metabolic Panel: Recent Labs  Lab 07/01/20 1521 07/02/20 0305 07/03/20 0500  NA 137 136 139  K 3.8 4.7 4.1  CL 103 109 103  CO2 24 15* 28  GLUCOSE 96 171* 145*  BUN 21 18 15   CREATININE 1.10* 0.99 0.91  CALCIUM 8.2* 8.1* 8.9  MG  --  QUANTITY NOT SUFFICIENT, UNABLE TO PERFORM TEST 2.0  PHOS  --  2.9 4.2   GFR: Estimated Creatinine Clearance: 40.5 mL/min (by C-G formula based on SCr of 0.91 mg/dL). Liver Function Tests: Recent Labs  Lab 07/02/20 0305 07/03/20 0500  AST 29 31  ALT 14 20  ALKPHOS 39 41  BILITOT 0.5 0.3  PROT 6.9 8.0  ALBUMIN 2.7* 3.2*   No results for input(s): LIPASE, AMYLASE in the last 168 hours. No results for input(s): AMMONIA in the last 168 hours. Coagulation Profile: Recent Labs  Lab 07/01/20 2047  INR 1.2   Cardiac Enzymes: No results for input(s): CKTOTAL, CKMB, CKMBINDEX, TROPONINI in the last 168 hours. BNP (last 3 results) No results for input(s): PROBNP in the last 8760 hours. HbA1C: No results  for input(s): HGBA1C in the last 72 hours. CBG: No results for input(s): GLUCAP in the last 168 hours. Lipid Profile: No results for input(s): CHOL, HDL, LDLCALC, TRIG, CHOLHDL, LDLDIRECT in the last 72 hours. Thyroid Function Tests: Recent Labs    07/01/20 2047  TSH 2.604   Anemia Panel: Recent Labs    07/01/20 2046 07/01/20 2046 07/01/20 2047 07/02/20 0305 07/03/20 0500  VITAMINB12 105*  --   --   --   --   FOLATE  --   --  12.1  --   --   FERRITIN 88   < >  --  81 90   < > = values in  this interval not displayed.   Urine analysis:    Component Value Date/Time   COLORURINE YELLOW 07/01/2020 2054   APPEARANCEUR CLEAR 07/01/2020 2054   LABSPEC 1.012 07/01/2020 2054   PHURINE 5.0 07/01/2020 2054   GLUCOSEU NEGATIVE 07/01/2020 2054   HGBUR NEGATIVE 07/01/2020 2054   [place] NEGATIVE 07/01/2020 2054   [place] NEGATIVE 07/01/2020 2054   PROTEINUR NEGATIVE 07/01/2020 2054   NITRITE NEGATIVE 07/01/2020 2054   LEUKOCYTESUR MODERATE (A) 07/01/2020 2054   Sepsis Labs: Invalid input(s): PROCALCITONIN, LACTICIDVEN  Recent Results (from the past 240 hour(s))  SARS Coronavirus 2 by RT PCR (hospital order, performed in [facility] hospital lab) Nasopharyngeal Nasopharyngeal Swab     Status: Abnormal   Collection Time: 07/01/20  3:00 PM   Specimen: Nasopharyngeal Swab  Result Value Ref Range Status   SARS Coronavirus 2 POSITIVE (A) NEGATIVE Final    Comment: RESULT CALLED TO, READ BACK BY AND VERIFIED WITH: E DIXON RN 07/01/20 AT 1757 SK (NOTE) SARS-CoV-2 target nucleic acids are DETECTED  SARS-CoV-2 RNA is generally detectable in upper respiratory specimens  during the acute phase of infection.  Positive results are indicative  of the presence of the identified virus, but do not rule out bacterial infection or co-infection with other pathogens not detected by the test.  Clinical correlation with patient history and  other diagnostic information is necessary to  determine patient infection status.  The expected result is negative.  Fact Sheet for Patients:   StrictlyIdeas.no   Fact Sheet for Healthcare Providers:   BankingDealers.co.za    This test is not yet approved or cleared by the [place] FDA and  has been authorized for detection and/or diagnosis of SARS-CoV-2 by FDA under an Emergency Use Authorization (EUA).  This EUA will remain in effect (meaning this test ca n be used) for the duration of  the COVID-19 declaration under Section 564(b)(1) of the Act, 21 U.S.C. section 360-bbb-3(b)(1), unless the authorization is terminated or revoked sooner.  Performed at [facility], [place] [address], [place], [place] [address]   Blood culture (routine single)     Status: None (Preliminary result)   Collection Time: 07/01/20  4:27 PM   Specimen: BLOOD RIGHT FOREARM  Result Value Ref Range Status   Specimen Description BLOOD RIGHT FOREARM  Final   Special Requests   Final    BOTTLES DRAWN AEROBIC AND ANAEROBIC Blood Culture results may not be optimal due to an excessive volume of blood received in culture bottles   Culture   Final    NO GROWTH 2 DAYS Performed at [facility], [place] [address] [place], [place] [address]    Report Status PENDING  Incomplete  Urine culture     Status: Abnormal   Collection Time: 07/01/20  8:35 PM   Specimen: Urine, Clean Catch  Result Value Ref Range Status   Specimen Description URINE, CLEAN CATCH  Final   Special Requests   Final    NONE Performed at [facility], [place] [address], [place], [place] [address]    Culture MULTIPLE SPECIES PRESENT, SUGGEST RECOLLECTION (A)  Final   Report Status 07/03/2020 FINAL  Final      Radiology Studies: DG Chest Port 1 View  Result Date: 07/01/2020 CLINICAL DATA:  Shortness of breath, cough and fever. Symptoms began 3 days ago. EXAM: PORTABLE CHEST 1 VIEW COMPARISON:  11/14/2018 FINDINGS: Chronic  cardiomegaly. Chronic aortic atherosclerosis. Chronic scoliosis with chest deformity subsequent to that.  Areas of patchy atelectasis and or infiltrate at both lung bases. Upper lungs appear clear. IMPRESSION: Patchy atelectasis or infiltrate at both lung bases. Cardiomegaly. Aortic atherosclerosis. Electronically Signed   By: Nelson Chimes M.D.   On: 07/01/2020 15:40    Marzetta Board, MD, PhD [facility]  Between 7 am - 7 pm I am available, please contact me via Amion or Securechat  Between 7 pm - 7 am I am not available, please contact night coverage MD/APP via Amion

## 2020-07-03 NOTE — ED Notes (Addendum)
Pt to be transported via stretcher to 5W34 w/telemetry and O2. Pt verified she has all of her belonging - including cell phone, charger, purse, and black/white bag.

## 2020-07-03 NOTE — ED Notes (Signed)
Pt assisted with toileting to bedside commode. Tolerated well.

## 2020-07-03 NOTE — ED Notes (Signed)
Attempted to call report. RN to call back. 

## 2020-07-03 NOTE — ED Notes (Signed)
Pt noted to be sleeping - states she would rather sleep a little while longer before taking meds and eating breakfast.

## 2020-07-04 LAB — C-REACTIVE PROTEIN: CRP: 0.6 mg/dL (ref <1.0)

## 2020-07-04 LAB — CBC WITH DIFFERENTIAL/PLATELET
Abs Immature Granulocytes: 0.03 10*3/uL (ref 0.00–0.07)
Basophils Absolute: 0 10*3/uL (ref 0.0–0.1)
Basophils Relative: 0 %
Eosinophils Absolute: 0 10*3/uL (ref 0.0–0.5)
Eosinophils Relative: 0 %
HCT: 37.6 % (ref 36.0–46.0)
Hemoglobin: 12 g/dL (ref 12.0–15.0)
Immature Granulocytes: 1 %
Lymphocytes Relative: 13 %
Lymphs Abs: 0.7 10*3/uL (ref 0.7–4.0)
MCH: 31.8 pg (ref 26.0–34.0)
MCHC: 31.9 g/dL (ref 30.0–36.0)
MCV: 99.7 fL (ref 80.0–100.0)
Monocytes Absolute: 0.2 10*3/uL (ref 0.1–1.0)
Monocytes Relative: 5 %
Neutro Abs: 4.4 10*3/uL (ref 1.7–7.7)
Neutrophils Relative %: 81 %
Platelets: 167 10*3/uL (ref 150–400)
RBC: 3.77 MIL/uL — ABNORMAL LOW (ref 3.87–5.11)
RDW: 13.2 % (ref 11.5–15.5)
WBC: 5.4 10*3/uL (ref 4.0–10.5)
nRBC: 0 % (ref 0.0–0.2)

## 2020-07-04 LAB — PHOSPHORUS: Phosphorus: 2.9 mg/dL (ref 2.5–4.6)

## 2020-07-04 LAB — COMPREHENSIVE METABOLIC PANEL
ALT: 18 U/L (ref 0–44)
AST: 27 U/L (ref 15–41)
Albumin: 2.8 g/dL — ABNORMAL LOW (ref 3.5–5.0)
Alkaline Phosphatase: 35 U/L — ABNORMAL LOW (ref 38–126)
Anion gap: 7 (ref 5–15)
BUN: 19 mg/dL (ref 8–23)
CO2: 29 mmol/L (ref 22–32)
Calcium: 8.9 mg/dL (ref 8.9–10.3)
Chloride: 101 mmol/L (ref 98–111)
Creatinine, Ser: 1.09 mg/dL — ABNORMAL HIGH (ref 0.44–1.00)
GFR calc Af Amer: 56 mL/min — ABNORMAL LOW (ref >60)
GFR calc non Af Amer: 49 mL/min — ABNORMAL LOW (ref >60)
Glucose, Bld: 159 mg/dL — ABNORMAL HIGH (ref 70–99)
Potassium: 4.3 mmol/L (ref 3.5–5.1)
Sodium: 137 mmol/L (ref 135–145)
Total Bilirubin: 0.5 mg/dL (ref 0.3–1.2)
Total Protein: 7 g/dL (ref 6.5–8.1)

## 2020-07-04 LAB — MAGNESIUM: Magnesium: 1.8 mg/dL (ref 1.7–2.4)

## 2020-07-04 LAB — D-DIMER, QUANTITATIVE: D-Dimer, Quant: 1.93 ug/mL-FEU — ABNORMAL HIGH (ref 0.00–0.50)

## 2020-07-04 LAB — FERRITIN: Ferritin: 81 ng/mL (ref 11–307)

## 2020-07-04 NOTE — Progress Notes (Signed)
PROGRESS NOTE  [name] May CLE:[id number] DOB: 1940-12-08 DOA: 07/01/2020 PCP: Jefm Petty, MD   LOS: 3 days   Brief Narrative / Interim history: [name] May is a 79 y.o. 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  She was diagnosed with COVID-19 and a chest x-ray showed bibasilar infiltrates.  She is vaccinated for Covid with the last dose mid-February  Subjective / 24h Interval events: Still complains of tightness in her breathing with activity.  No chest pain.  No abdominal pain, had mild nausea last night but no vomiting.  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  She also has kyphosis suggesting a restrictive process as well  Restless leg syndrome / chronic pain / migraine headache / other medical problems -  continue home medications   Scheduled Meds: . albuterol  2 puff Inhalation Q6H  . apixaban  2.5 mg Oral BID  . ascorbic acid  500 mg Oral Daily  . buPROPion  300 mg Oral q AM  . gabapentin  600 mg Oral 2 times per day  . iron polysaccharides  150 mg Oral BID WC  . lamoTRIgine  200 mg Oral QHS  . loratadine  10 mg Oral Daily  . LORazepam  0.5 mg Oral BID  . magnesium oxide  400 mg Oral Q breakfast  . methocarbamol  500 mg Oral TID  . methylPREDNISolone (SOLU-MEDROL) injection  0.5 mg/kg Intravenous Q12H  . pantoprazole  40 mg Oral BID  . pramipexole  0.5 mg Oral QHS  . umeclidinium bromide  1 puff Inhalation QPM  . zinc sulfate  220 mg Oral Daily   Continuous Infusions: . remdesivir 100 mg in NS 100 mL 100 mg (07/04/20 0914)   PRN Meds:.acetaminophen, albuterol, chlorpheniramine-HYDROcodone, dicyclomine, guaiFENesin-dextromethorphan, HYDROcodone-acetaminophen, ketotifen, loperamide, ondansetron **OR** ondansetron (ZOFRAN) IV, pramipexole, promethazine, SUMAtriptan, traMADol  DVT prophylaxis: Eliquis Code Status: Full code Family Communication: no family at bedside, called and discussed with the daughter over the phone  Status is: Inpatient   Remains inpatient appropriate because:Inpatient level of care appropriate due to severity of illness  Dispo: The patient is from: Home              Anticipated d/c is to: Home  Anticipated d/c date is: 1 day              Patient currently is not medically stable to d/c.  Consultants:  None   Procedures:  None   Microbiology: None   Antibacterials: None    Objective: Vitals:   07/04/20 0000 07/04/20 0400 07/04/20 0724 07/04/20 0731  BP: 128/69 119/62 133/75 121/69  Pulse: 74 65 77 68  Resp: 16 14 (!) 31 13  Temp: 97.9 F (36.6 C) 97.9 F (36.6 C) (!) 97.5 F (36.4 C) 97.9 F (36.6 C)  TempSrc: Oral Oral Oral Oral  SpO2: 91% 90% 92% 91%  Weight:      Height:        Intake/Output Summary (Last 24 hours)  at 07/04/2020 1025 Last data filed at 07/04/2020 0900 Gross per 24 hour  Intake 480 ml  Output --  Net 480 ml   Filed Weights   07/01/20 1630  Weight: 68 kg    Examination:  Constitutional: No distress, in bed Eyes: No scleral icterus ENMT: Moist mucous membranes Neck: normal, supple Respiratory: Distant breath sounds, no wheezing, no crackles, tachypneic at times Cardiovascular: Regular rate and rhythm, no murmurs appreciated, no edema Abdomen: Soft, nontender, nondistended, positive bowel sounds Musculoskeletal: no clubbing / cyanosis.  Skin: No new rashes Neurologic: Nonfocal  Data Reviewed: I have independently reviewed following labs and imaging studies   CBC: Recent Labs  Lab 07/01/20 1521 07/02/20 0305 07/03/20 0500 07/04/20 0425  WBC 3.8* 1.8* 5.0 5.4  NEUTROABS 2.3 1.3* 3.8 4.4  HGB 11.4* 11.5* 13.0 12.0  HCT 37.3 37.1 42.7 37.6  MCV 101.4* 101.4* 101.7* 99.7  PLT 144* 144* 172 643   Basic Metabolic Panel: Recent Labs  Lab 07/01/20 1521 07/02/20 0305 07/03/20 0500 07/04/20 0425  NA 137 136 139 137  K 3.8 4.7 4.1 4.3  CL 103 109 103 101  CO2 24 15* 28 29  GLUCOSE 96 171* 145* 159*  BUN 21 18 15 19   CREATININE 1.10* 0.99 0.91 1.09*  CALCIUM 8.2* 8.1* 8.9 8.9  MG  --  QUANTITY NOT SUFFICIENT, UNABLE TO PERFORM TEST 2.0 1.8  PHOS  --  2.9 4.2 2.9   GFR: Estimated Creatinine Clearance: 33.8 mL/min (A) (by C-G formula based on SCr of 1.09 mg/dL (H)). Liver Function Tests: Recent Labs  Lab 07/02/20 0305 07/03/20 0500 07/04/20 0425  AST 29 31 27   ALT 14 20 18   ALKPHOS 39 41 35*  BILITOT 0.5 0.3 0.5  PROT 6.9 8.0 7.0  ALBUMIN 2.7* 3.2* 2.8*   No results for input(s): LIPASE, AMYLASE in the last 168 hours. No results for input(s): AMMONIA in the last 168 hours. Coagulation Profile: Recent Labs  Lab 07/01/20 2047  INR 1.2   Cardiac Enzymes: No results for input(s): CKTOTAL, CKMB, CKMBINDEX, TROPONINI in the last 168 hours. BNP (last 3  results) No results for input(s): PROBNP in the last 8760 hours. HbA1C: No results for input(s): HGBA1C in the last 72 hours. CBG: No results for input(s): GLUCAP in the last 168 hours. Lipid Profile: No results for input(s): CHOL, HDL, LDLCALC, TRIG, CHOLHDL, LDLDIRECT in the last 72 hours. Thyroid Function Tests: Recent Labs    07/01/20 2047  TSH 2.604   Anemia Panel: Recent Labs    07/01/20 2046 07/01/20 2047 07/02/20 0305 07/03/20 0500 07/04/20 0425  VITAMINB12 105*  --   --   --   --   FOLATE  --  12.1  --   --   --  FERRITIN 88  --    < > 90 81   < > = values in this interval not displayed.   Urine analysis:    Component Value Date/Time   COLORURINE YELLOW 07/01/2020 2054   APPEARANCEUR CLEAR 07/01/2020 2054   LABSPEC 1.012 07/01/2020 2054   PHURINE 5.0 07/01/2020 2054   GLUCOSEU NEGATIVE 07/01/2020 2054   HGBUR NEGATIVE 07/01/2020 2054   [place] NEGATIVE 07/01/2020 2054   [place] NEGATIVE 07/01/2020 2054   PROTEINUR NEGATIVE 07/01/2020 2054   NITRITE NEGATIVE 07/01/2020 2054   LEUKOCYTESUR MODERATE (A) 07/01/2020 2054   Sepsis Labs: Invalid input(s): PROCALCITONIN, LACTICIDVEN  Recent Results (from the past 240 hour(s))  SARS Coronavirus 2 by RT PCR (hospital order, performed in [facility] hospital lab) Nasopharyngeal Nasopharyngeal Swab     Status: Abnormal   Collection Time: 07/01/20  3:00 PM   Specimen: Nasopharyngeal Swab  Result Value Ref Range Status   SARS Coronavirus 2 POSITIVE (A) NEGATIVE Final    Comment: RESULT CALLED TO, READ BACK BY AND VERIFIED WITH: E DIXON RN 07/01/20 AT 1757 SK (NOTE) SARS-CoV-2 target nucleic acids are DETECTED  SARS-CoV-2 RNA is generally detectable in upper respiratory specimens  during the acute phase of infection.  Positive results are indicative  of the presence of the identified virus, but do not rule out bacterial infection or co-infection with other pathogens not detected by the test.  Clinical  correlation with patient history and  other diagnostic information is necessary to determine patient infection status.  The expected result is negative.  Fact Sheet for Patients:   StrictlyIdeas.no   Fact Sheet for Healthcare Providers:   BankingDealers.co.za    This test is not yet approved or cleared by the [place] FDA and  has been authorized for detection and/or diagnosis of SARS-CoV-2 by FDA under an Emergency Use Authorization (EUA).  This EUA will remain in effect (meaning this test ca n be used) for the duration of  the COVID-19 declaration under Section 564(b)(1) of the Act, 21 U.S.C. section 360-bbb-3(b)(1), unless the authorization is terminated or revoked sooner.  Performed at [facility], [place] [address], [place], [place] [address]   Blood culture (routine single)     Status: None (Preliminary result)   Collection Time: 07/01/20  4:27 PM   Specimen: BLOOD RIGHT FOREARM  Result Value Ref Range Status   Specimen Description BLOOD RIGHT FOREARM  Final   Special Requests   Final    BOTTLES DRAWN AEROBIC AND ANAEROBIC Blood Culture results may not be optimal due to an excessive volume of blood received in culture bottles   Culture   Final    NO GROWTH 3 DAYS Performed at [facility], [place] [address], [place], [place] [address]    Report Status PENDING  Incomplete  Urine culture     Status: Abnormal   Collection Time: 07/01/20  8:35 PM   Specimen: Urine, Clean Catch  Result Value Ref Range Status   Specimen Description URINE, CLEAN CATCH  Final   Special Requests   Final    NONE Performed at [facility], [place] [address], [place], [place] [address]    Culture MULTIPLE SPECIES PRESENT, SUGGEST RECOLLECTION (A)  Final   Report Status 07/03/2020 FINAL  Final      Radiology Studies: No results found.  Marzetta Board, MD, PhD [facility]  Between 7 am - 7 pm I am available, please  contact me via Amion or [place]  Between 7 pm - 7 am I am not available, please contact night coverage MD/APP via Amion

## 2020-07-05 ENCOUNTER — Inpatient Hospital Stay (HOSPITAL_COMMUNITY): Payer: Medicare Other

## 2020-07-05 LAB — COMPREHENSIVE METABOLIC PANEL
ALT: 22 U/L (ref 0–44)
AST: 28 U/L (ref 15–41)
Albumin: 2.9 g/dL — ABNORMAL LOW (ref 3.5–5.0)
Alkaline Phosphatase: 36 U/L — ABNORMAL LOW (ref 38–126)
Anion gap: 6 (ref 5–15)
BUN: 19 mg/dL (ref 8–23)
CO2: 31 mmol/L (ref 22–32)
Calcium: 8.6 mg/dL — ABNORMAL LOW (ref 8.9–10.3)
Chloride: 100 mmol/L (ref 98–111)
Creatinine, Ser: 0.81 mg/dL (ref 0.44–1.00)
GFR calc Af Amer: >60 mL/min (ref >60)
GFR calc non Af Amer: >60 mL/min (ref >60)
Glucose, Bld: 103 mg/dL — ABNORMAL HIGH (ref 70–99)
Potassium: 3.5 mmol/L (ref 3.5–5.1)
Sodium: 137 mmol/L (ref 135–145)
Total Bilirubin: 0.5 mg/dL (ref 0.3–1.2)
Total Protein: 7.5 g/dL (ref 6.5–8.1)

## 2020-07-05 LAB — CBC WITH DIFFERENTIAL/PLATELET
Abs Immature Granulocytes: 0.03 10*3/uL (ref 0.00–0.07)
Basophils Absolute: 0 10*3/uL (ref 0.0–0.1)
Basophils Relative: 0 %
Eosinophils Absolute: 0 10*3/uL (ref 0.0–0.5)
Eosinophils Relative: 0 %
HCT: 40.2 % (ref 36.0–46.0)
Hemoglobin: 12.6 g/dL (ref 12.0–15.0)
Immature Granulocytes: 0 %
Lymphocytes Relative: 41 %
Lymphs Abs: 3.1 10*3/uL (ref 0.7–4.0)
MCH: 31.1 pg (ref 26.0–34.0)
MCHC: 31.3 g/dL (ref 30.0–36.0)
MCV: 99.3 fL (ref 80.0–100.0)
Monocytes Absolute: 0.5 10*3/uL (ref 0.1–1.0)
Monocytes Relative: 7 %
Neutro Abs: 4 10*3/uL (ref 1.7–7.7)
Neutrophils Relative %: 52 %
Platelets: 183 10*3/uL (ref 150–400)
RBC: 4.05 MIL/uL (ref 3.87–5.11)
RDW: 13.2 % (ref 11.5–15.5)
WBC: 7.7 10*3/uL (ref 4.0–10.5)
nRBC: 0 % (ref 0.0–0.2)

## 2020-07-05 LAB — D-DIMER, QUANTITATIVE: D-Dimer, Quant: 0.63 ug/mL-FEU — ABNORMAL HIGH (ref 0.00–0.50)

## 2020-07-05 LAB — TROPONIN I (HIGH SENSITIVITY)
Troponin I (High Sensitivity): 4 ng/L (ref <18)
Troponin I (High Sensitivity): 4 ng/L (ref <18)

## 2020-07-05 LAB — FERRITIN: Ferritin: 84 ng/mL (ref 11–307)

## 2020-07-05 LAB — MAGNESIUM: Magnesium: 1.6 mg/dL — ABNORMAL LOW (ref 1.7–2.4)

## 2020-07-05 LAB — C-REACTIVE PROTEIN: CRP: <0.5 mg/dL (ref <1.0)

## 2020-07-05 LAB — PHOSPHORUS: Phosphorus: 3 mg/dL (ref 2.5–4.6)

## 2020-07-05 MED ORDER — IOHEXOL 350 MG/ML SOLN
75.0000 mL | Freq: Once | INTRAVENOUS | Status: AC | PRN
  Administered 2020-07-05: 75 mL via INTRAVENOUS

## 2020-07-05 MED ORDER — POTASSIUM CHLORIDE CRYS ER 20 MEQ PO TBCR
40.0000 meq | EXTENDED_RELEASE_TABLET | Freq: Once | ORAL | Status: AC
  Administered 2020-07-05: 40 meq via ORAL
  Filled 2020-07-05: qty 2

## 2020-07-05 MED ORDER — MAGNESIUM SULFATE 2 GM/50ML IV SOLN
2.0000 g | Freq: Once | INTRAVENOUS | Status: AC
  Administered 2020-07-05: 2 g via INTRAVENOUS
  Filled 2020-07-05: qty 50

## 2020-07-05 NOTE — Progress Notes (Signed)
Patient complained of chest discomfort. Vitals stable, no resp distress or diaphoresis. EKG is being obtained, will also check CXR and enzymes.

## 2020-07-05 NOTE — Progress Notes (Signed)
PROGRESS NOTE  [name] May UXN:[id number] DOB: Oct 04, 1941 DOA: 07/01/2020 PCP: Jefm Petty, MD   LOS: 4 days   Brief Narrative / Interim history: [name] May is a 78 y.o. 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  She was diagnosed with COVID-19 and a chest x-ray showed bibasilar infiltrates.  She is vaccinated for Covid with the last dose mid-February  Subjective / 24h Interval events: Patient with new onset chest pain earlier this morning.  She states that it feels more like a tightness, in the middle of her chest towards the right side into her right shoulder and right side of her neck.  She had pain like this before, and this reminds her of her prior PE.  Denies any worsening shortness of breath  Assessment & Plan: Principal Problem Acute on chronic hypoxic Respiratory Failure due to Covid-19 Viral Illness -Patient was started on remdesivir and completed a 5-day course on 8/24. -She was also started on steroids, continue for total of 10 days   COVID-19 Labs  Recent Labs    07/03/20 0500 07/04/20 0425 07/05/20 0813  DDIMER 0.55* 1.93* 0.63*  FERRITIN 90 81  --   CRP 0.6 0.6  --     Lab Results  Component Value Date   SARSCOV2NAA POSITIVE (A) 07/01/2020    Active Problems Chest pain-new onset this morning, high-sensitivity troponin is negative.  D-dimer actually is improving.  She is on anticoagulation however low-dose Eliquis.  Given resemblance of her chest discomfort with her prior PE and active Covid infection, I will obtain a CT angiogram.  If positive will need higher Eliquis dose  Acute kidney injury-improved with fluids, creatinine has stabilized but blood work is pending this morning  Pancytopenia-likely in the setting of acute viral illness,  resolved  History of PE/DVT-has an IVC filter, continue Eliquis  Tobacco abuse, in remission, possible COPD-quit long time ago, probably has a degree of COPD given chronic hypoxia.  She also has kyphosis suggesting a restrictive process as well  Restless leg syndrome / chronic pain / migraine headache / other medical problems - continue home medications   Scheduled Meds: . albuterol  2 puff Inhalation Q6H  . apixaban  2.5 mg Oral BID  . ascorbic acid  500 mg Oral Daily  . buPROPion  300 mg Oral q AM  . gabapentin  600 mg Oral 2 times per day  . iron polysaccharides  150 mg Oral BID WC  . lamoTRIgine  200 mg Oral QHS  . loratadine  10 mg Oral Daily  . LORazepam  0.5 mg Oral BID  . magnesium oxide  400 mg Oral Q breakfast  . methocarbamol  500 mg Oral TID  . methylPREDNISolone (SOLU-MEDROL) injection  0.5 mg/kg Intravenous Q12H  . pantoprazole  40 mg Oral BID  . pramipexole  0.5 mg Oral QHS  . umeclidinium bromide  1 puff Inhalation QPM  . zinc sulfate  220 mg Oral Daily   Continuous Infusions: . remdesivir 100 mg in NS 100 mL 100 mg (07/04/20 0914)   PRN Meds:.acetaminophen, albuterol, chlorpheniramine-HYDROcodone, dicyclomine, guaiFENesin-dextromethorphan, HYDROcodone-acetaminophen, ketotifen, loperamide, ondansetron **OR** ondansetron (ZOFRAN) IV, pramipexole, promethazine, SUMAtriptan, traMADol  DVT prophylaxis: Eliquis Code Status: Full code Family Communication: no family at bedside, called and discussed with the daughter over the phone  Status is: Inpatient   Remains inpatient appropriate  because:Inpatient level of care appropriate due to severity of illness  Dispo: The patient is from: Home              Anticipated d/c is to: Home              Anticipated d/c date is: 1 day              Patient currently is not medically stable to d/c.  Consultants:  None   Procedures:  None   Microbiology: None   Antibacterials: None    Objective: Vitals:   07/04/20  1500 07/04/20 2110 07/05/20 0510 07/05/20 0548  BP: 110/66 123/76 128/73 139/79  Pulse: 81 81 68 67  Resp: 16 15 16 20   Temp: 98.5 F (36.9 C) 98.3 F (36.8 C) 97.9 F (36.6 C) 98.1 F (36.7 C)  TempSrc: Oral Oral Oral Oral  SpO2: 91% 91% 90% 92%  Weight:      Height:        Intake/Output Summary (Last 24 hours) at 07/05/2020 4196 Last data filed at 07/05/2020 0555 Gross per 24 hour  Intake 240 ml  Output --  Net 240 ml   Filed Weights   07/01/20 1630  Weight: 68 kg    Examination:  Constitutional: nad Eyes: no icterus  ENMT: mmm Neck: normal, supple Respiratory: no wheezing, no crackles, overall clear  Cardiovascular: RRR, no new murmurs, no edema Abdomen: soft, nt, nd, bs+ Musculoskeletal: no clubbing / cyanosis.  Skin: no rashes Neurologic: non focal   Data Reviewed: I have independently reviewed following labs and imaging studies   CBC: Recent Labs  Lab 07/01/20 1521 07/02/20 0305 07/03/20 0500 07/04/20 0425 07/05/20 0813  WBC 3.8* 1.8* 5.0 5.4 7.7  NEUTROABS 2.3 1.3* 3.8 4.4 4.0  HGB 11.4* 11.5* 13.0 12.0 12.6  HCT 37.3 37.1 42.7 37.6 40.2  MCV 101.4* 101.4* 101.7* 99.7 99.3  PLT 144* 144* 172 167 222   Basic Metabolic Panel: Recent Labs  Lab 07/01/20 1521 07/02/20 0305 07/03/20 0500 07/04/20 0425  NA 137 136 139 137  K 3.8 4.7 4.1 4.3  CL 103 109 103 101  CO2 24 15* 28 29  GLUCOSE 96 171* 145* 159*  BUN 21 18 15 19   CREATININE 1.10* 0.99 0.91 1.09*  CALCIUM 8.2* 8.1* 8.9 8.9  MG  --  QUANTITY NOT SUFFICIENT, UNABLE TO PERFORM TEST 2.0 1.8  PHOS  --  2.9 4.2 2.9   GFR: Estimated Creatinine Clearance: 33.8 mL/min (A) (by C-G formula based on SCr of 1.09 mg/dL (H)). Liver Function Tests: Recent Labs  Lab 07/02/20 0305 07/03/20 0500 07/04/20 0425  AST 29 31 27   ALT 14 20 18   ALKPHOS 39 41 35*  BILITOT 0.5 0.3 0.5  PROT 6.9 8.0 7.0  ALBUMIN 2.7* 3.2* 2.8*   No results for input(s): LIPASE, AMYLASE in the last 168 hours. No  results for input(s): AMMONIA in the last 168 hours. Coagulation Profile: Recent Labs  Lab 07/01/20 2047  INR 1.2   Cardiac Enzymes: No results for input(s): CKTOTAL, CKMB, CKMBINDEX, TROPONINI in the last 168 hours. BNP (last 3 results) No results for input(s): PROBNP in the last 8760 hours. HbA1C: No results for input(s): HGBA1C in the last 72 hours. CBG: No results for input(s): GLUCAP in the last 168 hours. Lipid Profile: No results for input(s): CHOL, HDL, LDLCALC, TRIG, CHOLHDL, LDLDIRECT in the last 72 hours. Thyroid Function Tests: No results for input(s): TSH, T4TOTAL, FREET4, T3FREE, THYROIDAB in the  last 72 hours. Anemia Panel: Recent Labs    07/03/20 0500 07/04/20 0425  FERRITIN 90 81   Urine analysis:    Component Value Date/Time   COLORURINE YELLOW 07/01/2020 2054   APPEARANCEUR CLEAR 07/01/2020 2054   LABSPEC 1.012 07/01/2020 2054   PHURINE 5.0 07/01/2020 2054   GLUCOSEU NEGATIVE 07/01/2020 2054   HGBUR NEGATIVE 07/01/2020 2054   [place] NEGATIVE 07/01/2020 2054   [place] NEGATIVE 07/01/2020 2054   PROTEINUR NEGATIVE 07/01/2020 2054   NITRITE NEGATIVE 07/01/2020 2054   LEUKOCYTESUR MODERATE (A) 07/01/2020 2054   Sepsis Labs: Invalid input(s): PROCALCITONIN, LACTICIDVEN  Recent Results (from the past 240 hour(s))  SARS Coronavirus 2 by RT PCR (hospital order, performed in [facility] hospital lab) Nasopharyngeal Nasopharyngeal Swab     Status: Abnormal   Collection Time: 07/01/20  3:00 PM   Specimen: Nasopharyngeal Swab  Result Value Ref Range Status   SARS Coronavirus 2 POSITIVE (A) NEGATIVE Final    Comment: RESULT CALLED TO, READ BACK BY AND VERIFIED WITH: E DIXON RN 07/01/20 AT 1757 SK (NOTE) SARS-CoV-2 target nucleic acids are DETECTED  SARS-CoV-2 RNA is generally detectable in upper respiratory specimens  during the acute phase of infection.  Positive results are indicative  of the presence of the identified virus, but do not rule  out bacterial infection or co-infection with other pathogens not detected by the test.  Clinical correlation with patient history and  other diagnostic information is necessary to determine patient infection status.  The expected result is negative.  Fact Sheet for Patients:   StrictlyIdeas.no   Fact Sheet for Healthcare Providers:   BankingDealers.co.za    This test is not yet approved or cleared by the [place] FDA and  has been authorized for detection and/or diagnosis of SARS-CoV-2 by FDA under an Emergency Use Authorization (EUA).  This EUA will remain in effect (meaning this test ca n be used) for the duration of  the COVID-19 declaration under Section 564(b)(1) of the Act, 21 U.S.C. section 360-bbb-3(b)(1), unless the authorization is terminated or revoked sooner.  Performed at [facility], [place] [address] [place], [place] [address]   Blood culture (routine single)     Status: None (Preliminary result)   Collection Time: 07/01/20  4:27 PM   Specimen: BLOOD RIGHT FOREARM  Result Value Ref Range Status   Specimen Description BLOOD RIGHT FOREARM  Final   Special Requests   Final    BOTTLES DRAWN AEROBIC AND ANAEROBIC Blood Culture results may not be optimal due to an excessive volume of blood received in culture bottles   Culture   Final    NO GROWTH 4 DAYS Performed at [facility], [place] [address] [place], [place] [address]    Report Status PENDING  Incomplete  Urine culture     Status: Abnormal   Collection Time: 07/01/20  8:35 PM   Specimen: Urine, Clean Catch  Result Value Ref Range Status   Specimen Description URINE, CLEAN CATCH  Final   Special Requests   Final    NONE Performed at [facility], [place] [address], [place], [place] [address]    Culture MULTIPLE SPECIES PRESENT, SUGGEST RECOLLECTION (A)  Final   Report Status 07/03/2020 FINAL  Final      Radiology Studies: DG CHEST PORT 1  VIEW  Result Date: 07/05/2020 CLINICAL DATA:  Chest pain, COVID, COPD. EXAM: PORTABLE CHEST 1 VIEW COMPARISON:  Chest x-rays dated 07/01/2020 and 11/14/2018. FINDINGS: Stable  cardiomegaly. Overall cardiomediastinal silhouette appears stable. Stable chronic streaky opacities at each lung base, most likely chronic atelectasis and/or scarring. No evidence of pneumonia on today's exam. No pleural effusion or pneumothorax is seen. No acute appearing osseous abnormality. IMPRESSION: 1. No active disease. No evidence of pneumonia on today's exam. 2. Stable cardiomegaly. Electronically Signed   By: Franki Cabot M.D.   On: 07/05/2020 08:15    Marzetta Board, MD, PhD [facility]  Between 7 am - 7 pm I am available, please contact me via Amion or [place]  Between 7 pm - 7 am I am not available, please contact night coverage MD/APP via Amion

## 2020-07-06 DIAGNOSIS — R079 Chest pain, unspecified: Secondary | ICD-10-CM

## 2020-07-06 LAB — C-REACTIVE PROTEIN: CRP: 0.7 mg/dL (ref <1.0)

## 2020-07-06 LAB — CBC WITH DIFFERENTIAL/PLATELET
Abs Immature Granulocytes: 0.02 10*3/uL (ref 0.00–0.07)
Basophils Absolute: 0 10*3/uL (ref 0.0–0.1)
Basophils Relative: 0 %
Eosinophils Absolute: 0 10*3/uL (ref 0.0–0.5)
Eosinophils Relative: 0 %
HCT: 41.7 % (ref 36.0–46.0)
Hemoglobin: 13 g/dL (ref 12.0–15.0)
Immature Granulocytes: 0 %
Lymphocytes Relative: 14 %
Lymphs Abs: 0.9 10*3/uL (ref 0.7–4.0)
MCH: 31 pg (ref 26.0–34.0)
MCHC: 31.2 g/dL (ref 30.0–36.0)
MCV: 99.3 fL (ref 80.0–100.0)
Monocytes Absolute: 0.3 10*3/uL (ref 0.1–1.0)
Monocytes Relative: 4 %
Neutro Abs: 5.2 10*3/uL (ref 1.7–7.7)
Neutrophils Relative %: 82 %
Platelets: 209 10*3/uL (ref 150–400)
RBC: 4.2 MIL/uL (ref 3.87–5.11)
RDW: 13.1 % (ref 11.5–15.5)
WBC: 6.4 10*3/uL (ref 4.0–10.5)
nRBC: 0 % (ref 0.0–0.2)

## 2020-07-06 LAB — COMPREHENSIVE METABOLIC PANEL
ALT: 24 U/L (ref 0–44)
AST: 26 U/L (ref 15–41)
Albumin: 3.1 g/dL — ABNORMAL LOW (ref 3.5–5.0)
Alkaline Phosphatase: 38 U/L (ref 38–126)
Anion gap: 8 (ref 5–15)
BUN: 20 mg/dL (ref 8–23)
CO2: 30 mmol/L (ref 22–32)
Calcium: 8.7 mg/dL — ABNORMAL LOW (ref 8.9–10.3)
Chloride: 98 mmol/L (ref 98–111)
Creatinine, Ser: 0.84 mg/dL (ref 0.44–1.00)
GFR calc Af Amer: >60 mL/min (ref >60)
GFR calc non Af Amer: >60 mL/min (ref >60)
Glucose, Bld: 175 mg/dL — ABNORMAL HIGH (ref 70–99)
Potassium: 4.1 mmol/L (ref 3.5–5.1)
Sodium: 136 mmol/L (ref 135–145)
Total Bilirubin: 0.4 mg/dL (ref 0.3–1.2)
Total Protein: 7.8 g/dL (ref 6.5–8.1)

## 2020-07-06 LAB — D-DIMER, QUANTITATIVE: D-Dimer, Quant: 0.46 ug/mL-FEU (ref 0.00–0.50)

## 2020-07-06 LAB — CULTURE, BLOOD (SINGLE): Culture: NO GROWTH

## 2020-07-06 LAB — FERRITIN: Ferritin: 78 ng/mL (ref 11–307)

## 2020-07-06 LAB — MAGNESIUM: Magnesium: 2.1 mg/dL (ref 1.7–2.4)

## 2020-07-06 LAB — PHOSPHORUS: Phosphorus: 3.5 mg/dL (ref 2.5–4.6)

## 2020-07-06 MED ORDER — MAGNESIUM SULFATE 2 GM/50ML IV SOLN
2.0000 g | Freq: Once | INTRAVENOUS | Status: AC
  Administered 2020-07-06: 2 g via INTRAVENOUS
  Filled 2020-07-06: qty 50

## 2020-07-06 MED ORDER — PREDNISONE 10 MG PO TABS: ORAL_TABLET | ORAL | 0 refills | Status: DC

## 2020-07-06 MED ORDER — BENZONATATE 100 MG PO CAPS: 100.0000 mg | ORAL_CAPSULE | Freq: Four times a day (QID) | ORAL | 0 refills | Status: AC | PRN

## 2020-07-06 NOTE — Progress Notes (Signed)
Patient ready to be discharge, waiting for her daughter to come to pick her up. Oxygen tank was delivered to her room.

## 2020-07-06 NOTE — Progress Notes (Addendum)
Patient was complaining of sudden pain on right side of chest (after coughing) "like the pain I have yesterday morning"; she described the pain like a discomfort, no radiation; VS stable; AfterTylenol 650 mg po pain decreased. MD notified.

## 2020-07-06 NOTE — Progress Notes (Signed)
PATIENT DETAILS Name: [name] May Age: 79 y.o. Sex: female Date of Birth: November 05, 1941 MRN: [id number]. Admitting Physician: Mckinley Jewel, MD NIO:[id number], Legrand Como, MD  Admit Date: 07/01/2020 Discharge date: 07/06/2020  Recommendations for Outpatient Follow-up:  1. Follow up with PCP in 1-2 weeks 2. Please obtain CMP/CBC in one week 3. Repeat Chest Xray in 4-6 week 4. Please reassess if patient still requires home O2 at next visit.  Admitted From:  Home  Disposition: Home with home health services   Home Health: Yes  Equipment/Devices:  oxygen 3L  Discharge Condition: Stable  CODE STATUS: FULL CODE  Diet recommendation:  Diet Order            Diet - low sodium heart healthy           Diet regular Room service appropriate? 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  She was diagnosed with COVID-19 and a chest x-ray showed bibasilar infiltrates.  She is vaccinated for Covid with the last dose mid-February.  Brief Hospital Course: Acute Hypoxic Respiratory Failure secondary to COVID-19 pneumonia: Treated with steroids and Remdesivir-titrated off oxygen to room air.  However does require O2 with ambulation-see PT note.  She is asymptomatic at rest.  Clinically significantly improved.  Stable for discharge on tapering steroids.  COVID-19 Labs:  Recent Labs    07/04/20 0425 07/05/20 0813 07/06/20 0920  DDIMER 1.93* 0.63* 0.46  FERRITIN 81 84 78  CRP 0.6 <0.5 0.7    Lab Results  Component Value Date   SARSCOV2NAA POSITIVE (A) 07/01/2020     Right-sided chest pain-occurred on 8/24-troponins were negative.  CTA chest negative.  Likely  atypical and related to musculoskeletal injury in the setting of COVID-19 pneumonia and coughing.  Chest pain has resolved.  Stable for discharge-continue Eliquis.    Acute kidney injury-likely hemodynamically mediated-resolved.  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  She also has kyphosis suggesting a restrictive process as well  Restless leg syndrome / chronic pain / migraine headache / other medical problems - continue home medications  Obesity: Estimated body mass index is 32.49 kg/m as calculated from the following:   Height as of this encounter: 4\' 9"  (1.448 m).   Weight as of this encounter: 68.1 kg.   Discharge Diagnoses:  Principal Problem:   Pneumonia due to COVID-19 virus Active Problems:   GERD (gastroesophageal reflux disease)   Acute respiratory failure with hypoxia (HCC)   Acute kidney injury ([place])   Macrocytic anemia   Thrombocytopenia (HCC)   Acute hypoxemic respiratory failure due to COVID-19 [facility])   Discharge Instructions:    Person Under Monitoring Name: [name] May  Location: [place] [address]   Infection Prevention Recommendations for Individuals Confirmed to have, or Being Evaluated for, 2019 Novel Coronavirus (COVID-19) Infection Who Receive Care at Home  Individuals who are confirmed to have, or are being evaluated for, COVID-19 should follow the prevention steps below until a healthcare provider or local or state health  department says they can return to normal activities.  Stay home except to get medical care You should restrict activities outside your home, except for getting medical care. Do not go to work, school, or public areas, and do not use public transportation or taxis.  Call ahead before visiting your  doctor Before your medical appointment, call the healthcare provider and tell them that you have, or are being evaluated for, COVID-19 infection. This will help the healthcare providers office take steps to keep other people from getting infected. Ask your healthcare provider to call the local or state health department.  Monitor your symptoms Seek prompt medical attention if your illness is worsening (e.g., difficulty breathing). Before going to your medical appointment, call the healthcare provider and tell them that you have, or are being evaluated for, COVID-19 infection. Ask your healthcare provider to call the local or state health department.  Wear a facemask You should wear a facemask that covers your nose and mouth when you are in the same room with other people and when you visit a healthcare provider. People who live with or visit you should also wear a facemask while they are in the same room with you.  Separate yourself from other people in your home As much as possible, you should stay in a different room from other people in your home. Also, you should use a separate bathroom, if available.  Avoid sharing household items You should not share dishes, drinking glasses, cups, eating utensils, towels, bedding, or other items with other people in your home. After using these items, you should wash them thoroughly with soap and water.  Cover your coughs and sneezes Cover your mouth and nose with a tissue when you cough or sneeze, or you can cough or sneeze into your sleeve. Throw used tissues in a lined trash can, and immediately wash your hands with soap and water for at least 20 seconds or use an alcohol-based hand rub.  Wash your [facility] your hands often and thoroughly with soap and water for at least 20 seconds. You can use an alcohol-based hand sanitizer if soap and water are not available and if your hands are not visibly dirty. Avoid touching your eyes, nose,  and mouth with unwashed hands.   Prevention Steps for Caregivers and Household Members of Individuals Confirmed to have, or Being Evaluated for, COVID-19 Infection Being Cared for in the Home  If you live with, or provide care at home for, a person confirmed to have, or being evaluated for, COVID-19 infection please follow these guidelines to prevent infection:  Follow healthcare providers instructions Make sure that you understand and can help the patient follow any healthcare provider instructions for all care.  Provide for the patients basic needs You should help the patient with basic needs in the home and provide support for getting groceries, prescriptions, and other personal needs.  Monitor the patients symptoms If they are getting sicker, call his or her medical provider and tell them that the patient has, or is being evaluated for, COVID-19 infection. This will help the healthcare providers office take steps to keep other people from getting infected. Ask the healthcare provider to call the local or state health department.  Limit the number of people who have contact with the patient  If possible, have only one caregiver for the patient.  Other household members should stay in another home or place of residence. If this is not possible, they should stay  in another room,  or be separated from the patient as much as possible. Use a separate bathroom, if available.  Restrict visitors who do not have an essential need to be in the home.  Keep older adults, very young children, and other sick people away from the patient Keep older adults, very young children, and those who have compromised immune systems or chronic health conditions away from the patient. This includes people with chronic heart, lung, or kidney conditions, diabetes, and cancer.  Ensure good ventilation Make sure that shared spaces in the home have good air flow, such as from an air conditioner or an  opened window, weather permitting.  Wash your hands often  Wash your hands often and thoroughly with soap and water for at least 20 seconds. You can use an alcohol based hand sanitizer if soap and water are not available and if your hands are not visibly dirty.  Avoid touching your eyes, nose, and mouth with unwashed hands.  Use disposable paper towels to dry your hands. If not available, use dedicated cloth towels and replace them when they become wet.  Wear a facemask and gloves  Wear a disposable facemask at all times in the room and gloves when you touch or have contact with the patients blood, body fluids, and/or secretions or excretions, such as sweat, saliva, sputum, nasal mucus, vomit, urine, or feces.  Ensure the mask fits over your nose and mouth tightly, and do not touch it during use.  Throw out disposable facemasks and gloves after using them. Do not reuse.  Wash your hands immediately after removing your facemask and gloves.  If your personal clothing becomes contaminated, carefully remove clothing and launder. Wash your hands after handling contaminated clothing.  Place all used disposable facemasks, gloves, and other waste in a lined container before disposing them with other household waste.  Remove gloves and wash your hands immediately after handling these items.  Do not share dishes, glasses, or other household items with the patient  Avoid sharing household items. You should not share dishes, drinking glasses, cups, eating utensils, towels, bedding, or other items with a patient who is confirmed to have, or being evaluated for, COVID-19 infection.  After the person uses these items, you should wash them thoroughly with soap and water.  Wash laundry thoroughly  Immediately remove and wash clothes or bedding that have blood, body fluids, and/or secretions or excretions, such as sweat, saliva, sputum, nasal mucus, vomit, urine, or feces, on them.  Wear gloves  when handling laundry from the patient.  Read and follow directions on labels of laundry or clothing items and detergent. In general, wash and dry with the warmest temperatures recommended on the label.  Clean all areas the individual has used often  Clean all touchable surfaces, such as counters, tabletops, doorknobs, bathroom fixtures, toilets, phones, keyboards, tablets, and bedside tables, every day. Also, clean any surfaces that may have blood, body fluids, and/or secretions or excretions on them.  Wear gloves when cleaning surfaces the patient has come in contact with.  Use a diluted bleach solution (e.g., dilute bleach with 1 part bleach and 10 parts water) or a household disinfectant with a label that says EPA-registered for coronaviruses. To make a bleach solution at home, add 1 tablespoon of bleach to 1 quart (4 cups) of water. For a larger supply, add  cup of bleach to 1 gallon (16 cups) of water.  Read labels of cleaning products and follow recommendations provided on product labels. Labels contain instructions  for safe and effective use of the cleaning product including precautions you should take when applying the product, such as wearing gloves or eye protection and making sure you have good ventilation during use of the product.  Remove gloves and wash hands immediately after cleaning.  Monitor yourself for signs and symptoms of illness Caregivers and household members are considered close contacts, should monitor their health, and will be asked to limit movement outside of the home to the extent possible. Follow the monitoring steps for close contacts listed on the symptom monitoring form.   ? If you have additional questions, contact your local health department or call the epidemiologist on call at [phone] (available 24/7). ? This guidance is subject to change. For the most up-to-date guidance from CDC, please refer to their  website: YouBlogs.pl    Activity:  As tolerated  Discharge Instructions    Call MD for:  difficulty breathing, headache or visual disturbances   Complete by: As directed    Diet - low sodium heart healthy   Complete by: As directed    Discharge instructions   Complete by: As directed    Follow with Primary MD  Jefm Petty, MD in 1-2 weeks  Please ask your primary care practitioner to reassess with you require home O2 at next visit.  Please get a complete blood count and chemistry panel checked by your Primary MD at your next visit, and again as instructed by your Primary MD.  Get Medicines reviewed and adjusted: Please take all your medications with you for your next visit with your Primary MD  Laboratory/radiological data: Please request your Primary MD to go over all hospital tests and procedure/radiological results at the follow up, please ask your Primary MD to get all Hospital records sent to his/her office.  In some cases, they will be blood work, cultures and biopsy results pending at the time of your discharge. Please request that your primary care M.D. follows up on these results.  Also Note the following: If you experience worsening of your admission symptoms, develop shortness of breath, life threatening emergency, suicidal or homicidal thoughts you must seek medical attention immediately by calling 911 or calling your MD immediately  if symptoms less severe.  You must read complete instructions/literature along with all the possible adverse reactions/side effects for all the Medicines you take and that have been prescribed to you. Take any new Medicines after you have completely understood and accpet all the possible adverse reactions/side effects.   Do not drive when taking Pain medications or sleeping medications (Benzodaizepines)  Do not take more than prescribed Pain, Sleep and Anxiety Medications.  It is not advisable to combine anxiety,sleep and pain medications without talking with your primary care practitioner  Special Instructions: If you have smoked or chewed Tobacco  in the last 2 yrs please stop smoking, stop any regular Alcohol  and or any Recreational drug use.  Wear Seat belts while driving.  Please note: You were cared for by a hospitalist during your hospital stay. Once you are discharged, your primary care physician will handle any further medical issues. Please note that NO REFILLS for any discharge medications will be authorized once you are discharged, as it is imperative that you return to your primary care physician (or establish a relationship with a primary care physician if you do not have one) for your post hospital discharge needs so that they can reassess your need for medications and monitor your lab values.   Discharge  instructions   Complete by: As directed    Follow with Primary MD  Jefm Petty, MD in 1-2 weeks  3 weeks of isolation from 07/01/2020  Please ask your primary care practitioner to reassess whether you require home O2 at next visit.  Please get a complete blood count and chemistry panel checked by your Primary MD at your next visit, and again as instructed by your Primary MD.  Get Medicines reviewed and adjusted: Please take all your medications with you for your next visit with your Primary MD  Laboratory/radiological data: Please request your Primary MD to go over all hospital tests and procedure/radiological results at the follow up, please ask your Primary MD to get all Hospital records sent to his/her office.  In some cases, they will be blood work, cultures and biopsy results pending at the time of your discharge. Please request that your primary care M.D. follows up on these results.  Also Note the following: If you experience worsening of your admission symptoms, develop shortness of breath, life threatening emergency, suicidal or  homicidal thoughts you must seek medical attention immediately by calling 911 or calling your MD immediately  if symptoms less severe.  You must read complete instructions/literature along with all the possible adverse reactions/side effects for all the Medicines you take and that have been prescribed to you. Take any new Medicines after you have completely understood and accpet all the possible adverse reactions/side effects.   Do not drive when taking Pain medications or sleeping medications (Benzodaizepines)  Do not take more than prescribed Pain, Sleep and Anxiety Medications. It is not advisable to combine anxiety,sleep and pain medications without talking with your primary care practitioner  Special Instructions: If you have smoked or chewed Tobacco  in the last 2 yrs please stop smoking, stop any regular Alcohol  and or any Recreational drug use.  Wear Seat belts while driving.  Please note: You were cared for by a hospitalist during your hospital stay. Once you are discharged, your primary care physician will handle any further medical issues. Please note that NO REFILLS for any discharge medications will be authorized once you are discharged, as it is imperative that you return to your primary care physician (or establish a relationship with a primary care physician if you do not have one) for your post hospital discharge needs so that they can reassess your need for medications and monitor your lab values.   Increase activity slowly   Complete by: As directed    Increase activity slowly   Complete by: As directed      Allergies as of 07/06/2020      Reactions   Latex Itching, Rash, Other (See Comments)   Blisters for months on legs   Mold Extract [trichophyton] Anaphylaxis, Swelling, Rash   Molds & Smuts Anaphylaxis, Swelling, Rash   Vancomycin Other (See Comments)   05/01/17: Pt noted to c/o redness and burning to R arm during IV Vancomycin 1g infusion. Infusion stopped and  diphenhydramine 25mg  IV given.    Cephalexin Rash      Medication List    STOP taking these medications   Besifloxacin HCl 0.6 % Susp   Bromfenac Sodium 0.07 % Soln   CALCIUM + D PO   Calcium 500 + D 500-125 MG-UNIT Tabs Generic drug: Calcium Carbonate-Vitamin D   Calcium 500-125 MG-UNIT Tabs   clorazepate 3.75 MG tablet Commonly known as: TRANXENE   Difluprednate 0.05 % Emul   ferrous sulfate 325 (65 FE)  MG EC tablet   furosemide 20 MG tablet Commonly known as: LASIX   ketoconazole 2 % cream Commonly known as: NIZORAL   MAGnesium-Oxide 400 (241.3 Mg) MG tablet Generic drug: magnesium oxide   oxyCODONE-acetaminophen 5-325 MG tablet Commonly known as: PERCOCET/ROXICET   rOPINIRole 2 MG tablet Commonly known as: REQUIP     TAKE these medications   acetaminophen 650 MG CR tablet Commonly known as: TYLENOL Take 1,300 mg by mouth every 8 (eight) hours as needed for pain (or headaches).   albuterol 108 (90 Base) MCG/ACT inhaler Commonly known as: VENTOLIN HFA Inhale 2 puffs into the lungs every 6 (six) hours as needed for wheezing or shortness of breath.   alendronate 70 MG tablet Commonly known as: FOSAMAX Take 70 mg by mouth every Sunday. Take with a full glass of water on an empty stomach.   ascorbic acid 500 MG tablet Commonly known as: VITAMIN C Take 500 mg by mouth daily. What changed: Another medication with the same name was removed. Continue taking this medication, and follow the directions you see here.   benzonatate 100 MG capsule Commonly known as: Tessalon Perles Take 1 capsule (100 mg total) by mouth every 6 (six) hours as needed for cough.   buPROPion 300 MG 24 hr tablet Commonly known as: WELLBUTRIN XL Take 300 mg by mouth in the morning.   CALCIUM + D3 PO Take 1 tablet by mouth daily with breakfast.   dicyclomine 20 MG tablet Commonly known as: BENTYL Take 20 mg by mouth every 6 (six) hours as needed for spasms.   Eliquis 2.5 MG  Tabs tablet Generic drug: apixaban Take 2.5 mg by mouth 2 (two) times daily.   gabapentin 600 MG tablet Commonly known as: NEURONTIN Take 600 mg by mouth See admin instructions. Take 600 mg by mouth at bedtime and an additional 600 mg at 6 PM, as needed/as directed   HYDROcodone-acetaminophen 10-325 MG tablet Commonly known as: NORCO Take 0.5-1 tablets by mouth daily as needed for moderate pain.   hydrocortisone 2.5 % cream Apply 1 application topically daily as needed (eczema).   Incruse Ellipta 62.5 MCG/INH Aepb Generic drug: umeclidinium bromide Inhale 1 puff into the lungs every evening.   ketotifen 0.025 % ophthalmic solution Commonly known as: ZADITOR Place 1 drop into both eyes 3 (three) times daily as needed (for itching).   lamoTRIgine 200 MG tablet Commonly known as: LAMICTAL Take 200 mg by mouth at bedtime.   loperamide 2 MG tablet Commonly known as: IMODIUM A-D Take 2 mg by mouth 4 (four) times daily as needed for diarrhea or loose stools.   loratadine 10 MG tablet Commonly known as: CLARITIN Take 10 mg by mouth daily.   LORazepam 0.5 MG tablet Commonly known as: ATIVAN Take 0.5 mg by mouth in the morning and at bedtime.   magnesium oxide 400 MG tablet Commonly known as: MAG-OX Take 400 mg by mouth daily with breakfast.   methocarbamol 500 MG tablet Commonly known as: ROBAXIN Take 1 tablet (500 mg total) by mouth 3 (three) times daily.   Nu-Iron 150 MG capsule Generic drug: iron polysaccharides Take 150 mg by mouth 2 (two) times daily with a meal.   pantoprazole 40 MG tablet Commonly known as: PROTONIX Take 40 mg by mouth 2 (two) times daily.   pramipexole 0.5 MG tablet Commonly known as: MIRAPEX Take 0.5 mg by mouth See admin instructions. Take 0.5 mg by mouth at bedtime and additional 0.5 mg at 6  PM, if needed for RLS   predniSONE 10 MG tablet Commonly known as: DELTASONE Take 40 mg daily for 1 day, 30 mg daily for 1 day, 20 mg daily for 1  days,10 mg daily for 1 day, then stop   promethazine 25 MG tablet Commonly known as: PHENERGAN Take 25 mg by mouth every 8 (eight) hours as needed for nausea or vomiting.   SUMAtriptan 50 MG tablet Commonly known as: IMITREX Take 50 mg by mouth once as needed for migraine (and may repeat once in 2 hours, if no relief). What changed: Another medication with the same name was removed. Continue taking this medication, and follow the directions you see here.   traMADol 50 MG tablet Commonly known as: ULTRAM Take 50 mg by mouth 2 (two) times daily as needed (for pain).            Durable Medical Equipment  (From admission, onward)         Start     Ordered   07/06/20 1154  For home use only DME oxygen  Once       Question Answer Comment  Length of Need 6 Months   Mode or (Route) Nasal cannula   Liters per Minute 3   Frequency Continuous (stationary and portable oxygen unit needed)   Oxygen conserving device Yes   Oxygen delivery system Gas      07/06/20 1154          Follow-up Information    Jefm Petty, MD. Schedule an appointment as soon as possible for a visit in 1 week(s).   Specialty: Family Medicine Contact information: [address] [place] [address] [phone]              Allergies  Allergen Reactions   Latex Itching, Rash and Other (See Comments)    Blisters for months on legs   Mold Extract [Trichophyton] Anaphylaxis, Swelling and Rash   Molds & Smuts Anaphylaxis, Swelling and Rash   Vancomycin Other (See Comments)    05/01/17: Pt noted to c/o redness and burning to R arm during IV Vancomycin 1g infusion. Infusion stopped and diphenhydramine 25mg  IV given.    Cephalexin Rash     Other Procedures/Studies: CT ANGIO CHEST PE W OR WO CONTRAST  Result Date: 07/05/2020 CLINICAL DATA:  Positive D-dimer.  Shortness of breath. EXAM: CT ANGIOGRAPHY CHEST WITH CONTRAST TECHNIQUE: Multidetector CT imaging of the chest was  performed using the standard protocol during bolus administration of intravenous contrast. Multiplanar CT image reconstructions and MIPs were obtained to evaluate the vascular anatomy. CONTRAST:  86mL OMNIPAQUE IOHEXOL 350 MG/ML SOLN COMPARISON:  None. FINDINGS: Cardiovascular: Satisfactory opacification of the pulmonary arteries to the segmental level. No evidence of pulmonary embolism. Normal heart size. No pericardial effusion. Thoracic aortic atherosclerosis. Mediastinum/Nodes: No enlarged mediastinal, hilar, or axillary lymph nodes. Thyroid gland, trachea, and esophagus demonstrate no significant findings. Lungs/Pleura: Trace bilateral pleural effusions. Right basilar and right perihilar atelectasis. Patchy ground-glass airspace disease in the right middle lobe. Left basilar airspace disease which may reflect atelectasis versus pneumonia. Mild bilateral centrilobular emphysema. No pneumothorax. Upper Abdomen: No acute abnormality.  Large hiatal hernia. Musculoskeletal: Severe dextroscoliosis of the thoracolumbar spine. Chronic T12, T9, T7 vertebral body compression fractures. Degenerative disease with disc height loss and facet arthropathy of the cervicothoracic spine. Review of the MIP images confirms the above findings. IMPRESSION: 1. No evidence of pulmonary embolus. 2. Left basilar airspace disease which may reflect atelectasis versus pneumonia.  Patchy ground-glass airspace disease in the right middle lobe concerning for pneumonia. 3. Trace bilateral pleural effusions. 4. Aortic Atherosclerosis (ICD10-I70.0) and Emphysema (ICD10-J43.9). 5. Large hiatal hernia. Electronically Signed   By: Kathreen Devoid   On: 07/05/2020 12:59   DG CHEST PORT 1 VIEW  Result Date: 07/05/2020 CLINICAL DATA:  Chest pain, COVID, COPD. EXAM: PORTABLE CHEST 1 VIEW COMPARISON:  Chest x-rays dated 07/01/2020 and 11/14/2018. FINDINGS: Stable cardiomegaly. Overall cardiomediastinal silhouette appears stable. Stable chronic streaky  opacities at each lung base, most likely chronic atelectasis and/or scarring. No evidence of pneumonia on today's exam. No pleural effusion or pneumothorax is seen. No acute appearing osseous abnormality. IMPRESSION: 1. No active disease. No evidence of pneumonia on today's exam. 2. Stable cardiomegaly. Electronically Signed   By: Franki Cabot M.D.   On: 07/05/2020 08:15   DG Chest Port 1 View  Result Date: 07/01/2020 CLINICAL DATA:  Shortness of breath, cough and fever. Symptoms began 3 days ago. EXAM: PORTABLE CHEST 1 VIEW COMPARISON:  11/14/2018 FINDINGS: Chronic cardiomegaly. Chronic aortic atherosclerosis. Chronic scoliosis with chest deformity subsequent to that. Areas of patchy atelectasis and or infiltrate at both lung bases. Upper lungs appear clear. IMPRESSION: Patchy atelectasis or infiltrate at both lung bases. Cardiomegaly. Aortic atherosclerosis. Electronically Signed   By: Nelson Chimes M.D.   On: 07/01/2020 15:40     TODAY-DAY OF DISCHARGE:  Subjective:   [name] today has no headache,no chest abdominal pain,no new weakness tingling or numbness, feels much better wants to go home today.   Objective:   Blood pressure 136/74, pulse 97, temperature 98.2 F (36.8 C), temperature source Oral, resp. rate 17, height 4\' 9"  (1.448 m), weight 68.1 kg, SpO2 91 %.  Intake/Output Summary (Last 24 hours) at 07/06/2020 1257 Last data filed at 07/06/2020 1243 Gross per 24 hour  Intake 1250 ml  Output --  Net 1250 ml   Filed Weights   07/01/20 1630 07/06/20 0957  Weight: 68 kg 68.1 kg    Exam: Awake Alert, Oriented *3, No new F.N deficits, Normal affect [place].AT,PERRAL Supple Neck,No JVD, No cervical lymphadenopathy appriciated.  Symmetrical Chest wall movement, Good air movement bilaterally, CTAB RRR,No Gallops,Rubs or new Murmurs, No Parasternal Heave +ve B.Sounds, Abd Soft, Non tender, No organomegaly appriciated, No rebound -guarding or rigidity. No Cyanosis, Clubbing or  edema, No new Rash or bruise   PERTINENT RADIOLOGIC STUDIES: CT ANGIO CHEST PE W OR WO CONTRAST  Result Date: 07/05/2020 CLINICAL DATA:  Positive D-dimer.  Shortness of breath. EXAM: CT ANGIOGRAPHY CHEST WITH CONTRAST TECHNIQUE: Multidetector CT imaging of the chest was performed using the standard protocol during bolus administration of intravenous contrast. Multiplanar CT image reconstructions and MIPs were obtained to evaluate the vascular anatomy. CONTRAST:  59mL OMNIPAQUE IOHEXOL 350 MG/ML SOLN COMPARISON:  None. FINDINGS: Cardiovascular: Satisfactory opacification of the pulmonary arteries to the segmental level. No evidence of pulmonary embolism. Normal heart size. No pericardial effusion. Thoracic aortic atherosclerosis. Mediastinum/Nodes: No enlarged mediastinal, hilar, or axillary lymph nodes. Thyroid gland, trachea, and esophagus demonstrate no significant findings. Lungs/Pleura: Trace bilateral pleural effusions. Right basilar and right perihilar atelectasis. Patchy ground-glass airspace disease in the right middle lobe. Left basilar airspace disease which may reflect atelectasis versus pneumonia. Mild bilateral centrilobular emphysema. No pneumothorax. Upper Abdomen: No acute abnormality.  Large hiatal hernia. Musculoskeletal: Severe dextroscoliosis of the thoracolumbar spine. Chronic T12, T9, T7 vertebral body compression fractures. Degenerative disease with disc height loss and facet arthropathy of the cervicothoracic spine. Review  of the MIP images confirms the above findings. IMPRESSION: 1. No evidence of pulmonary embolus. 2. Left basilar airspace disease which may reflect atelectasis versus pneumonia. Patchy ground-glass airspace disease in the right middle lobe concerning for pneumonia. 3. Trace bilateral pleural effusions. 4. Aortic Atherosclerosis (ICD10-I70.0) and Emphysema (ICD10-J43.9). 5. Large hiatal hernia. Electronically Signed   By: Kathreen Devoid   On: 07/05/2020 12:59   DG CHEST  PORT 1 VIEW  Result Date: 07/05/2020 CLINICAL DATA:  Chest pain, COVID, COPD. EXAM: PORTABLE CHEST 1 VIEW COMPARISON:  Chest x-rays dated 07/01/2020 and 11/14/2018. FINDINGS: Stable cardiomegaly. Overall cardiomediastinal silhouette appears stable. Stable chronic streaky opacities at each lung base, most likely chronic atelectasis and/or scarring. No evidence of pneumonia on today's exam. No pleural effusion or pneumothorax is seen. No acute appearing osseous abnormality. IMPRESSION: 1. No active disease. No evidence of pneumonia on today's exam. 2. Stable cardiomegaly. Electronically Signed   By: Franki Cabot M.D.   On: 07/05/2020 08:15   DG Chest Port 1 View  Result Date: 07/01/2020 CLINICAL DATA:  Shortness of breath, cough and fever. Symptoms began 3 days ago. EXAM: PORTABLE CHEST 1 VIEW COMPARISON:  11/14/2018 FINDINGS: Chronic cardiomegaly. Chronic aortic atherosclerosis. Chronic scoliosis with chest deformity subsequent to that. Areas of patchy atelectasis and or infiltrate at both lung bases. Upper lungs appear clear. IMPRESSION: Patchy atelectasis or infiltrate at both lung bases. Cardiomegaly. Aortic atherosclerosis. Electronically Signed   By: Nelson Chimes M.D.   On: 07/01/2020 15:40     PERTINENT LAB RESULTS: CBC: Recent Labs    07/05/20 0813 07/06/20 0920  WBC 7.7 6.4  HGB 12.6 13.0  HCT 40.2 41.7  PLT 183 209   CMET CMP     Component Value Date/Time   NA 136 07/06/2020 0920   K 4.1 07/06/2020 0920   CL 98 07/06/2020 0920   CO2 30 07/06/2020 0920   GLUCOSE 175 (H) 07/06/2020 0920   BUN 20 07/06/2020 0920   CREATININE 0.84 07/06/2020 0920   CALCIUM 8.7 (L) 07/06/2020 0920   PROT 7.8 07/06/2020 0920   ALBUMIN 3.1 (L) 07/06/2020 0920   AST 26 07/06/2020 0920   ALT 24 07/06/2020 0920   ALKPHOS 38 07/06/2020 0920   BILITOT 0.4 07/06/2020 0920   GFRNONAA >60 07/06/2020 0920   GFRAA >60 07/06/2020 0920    GFR Estimated Creatinine Clearance: 43.9 mL/min (by C-G  formula based on SCr of 0.84 mg/dL). No results for input(s): LIPASE, AMYLASE in the last 72 hours. No results for input(s): CKTOTAL, CKMB, CKMBINDEX, TROPONINI in the last 72 hours. Invalid input(s): POCBNP Recent Labs    07/05/20 0813 07/06/20 0920  DDIMER 0.63* 0.46   No results for input(s): HGBA1C in the last 72 hours. No results for input(s): CHOL, HDL, LDLCALC, TRIG, CHOLHDL, LDLDIRECT in the last 72 hours. No results for input(s): TSH, T4TOTAL, T3FREE, THYROIDAB in the last 72 hours.  Invalid input(s): FREET3 Recent Labs    07/05/20 0813 07/06/20 0920  FERRITIN 84 78   Coags: No results for input(s): INR in the last 72 hours.  Invalid input(s): PT Microbiology: Recent Results (from the past 240 hour(s))  SARS Coronavirus 2 by RT PCR (hospital order, performed in [facility] hospital lab) Nasopharyngeal Nasopharyngeal Swab     Status: Abnormal   Collection Time: 07/01/20  3:00 PM   Specimen: Nasopharyngeal Swab  Result Value Ref Range Status   SARS Coronavirus 2 POSITIVE (A) NEGATIVE Final    Comment: RESULT CALLED TO,  READ BACK BY AND VERIFIED WITH: E DIXON RN 07/01/20 AT 5809 SK (NOTE) SARS-CoV-2 target nucleic acids are DETECTED  SARS-CoV-2 RNA is generally detectable in upper respiratory specimens  during the acute phase of infection.  Positive results are indicative  of the presence of the identified virus, but do not rule out bacterial infection or co-infection with other pathogens not detected by the test.  Clinical correlation with patient history and  other diagnostic information is necessary to determine patient infection status.  The expected result is negative.  Fact Sheet for Patients:   StrictlyIdeas.no   Fact Sheet for Healthcare Providers:   BankingDealers.co.za    This test is not yet approved or cleared by the [place] FDA and  has been authorized for detection and/or diagnosis of SARS-CoV-2  by FDA under an Emergency Use Authorization (EUA).  This EUA will remain in effect (meaning this test ca n be used) for the duration of  the COVID-19 declaration under Section 564(b)(1) of the Act, 21 U.S.C. section 360-bbb-3(b)(1), unless the authorization is terminated or revoked sooner.  Performed at [facility], [place] [address] [place], [place] [address]   Blood culture (routine single)     Status: None   Collection Time: 07/01/20  4:27 PM   Specimen: BLOOD RIGHT FOREARM  Result Value Ref Range Status   Specimen Description BLOOD RIGHT FOREARM  Final   Special Requests   Final    BOTTLES DRAWN AEROBIC AND ANAEROBIC Blood Culture results may not be optimal due to an excessive volume of blood received in culture bottles   Culture   Final    NO GROWTH 5 DAYS Performed at [facility], [place] [address], [place], [place] [address]    Report Status 07/06/2020 FINAL  Final  Urine culture     Status: Abnormal   Collection Time: 07/01/20  8:35 PM   Specimen: Urine, Clean Catch  Result Value Ref Range Status   Specimen Description URINE, CLEAN CATCH  Final   Special Requests   Final    NONE Performed at [facility], [place] [address], [place], [place] [address]    Culture MULTIPLE SPECIES PRESENT, SUGGEST RECOLLECTION (A)  Final   Report Status 07/03/2020 FINAL  Final    FURTHER DISCHARGE INSTRUCTIONS:  Get Medicines reviewed and adjusted: Please take all your medications with you for your next visit with your Primary MD  Laboratory/radiological data: Please request your Primary MD to go over all hospital tests and procedure/radiological results at the follow up, please ask your Primary MD to get all Hospital records sent to his/her office.  In some cases, they will be blood work, cultures and biopsy results pending at the time of your discharge. Please request that your primary care M.D. goes through all the records of your hospital data and follows up on these  results.  Also Note the following: If you experience worsening of your admission symptoms, develop shortness of breath, life threatening emergency, suicidal or homicidal thoughts you must seek medical attention immediately by calling 911 or calling your MD immediately  if symptoms less severe.  You must read complete instructions/literature along with all the possible adverse reactions/side effects for all the Medicines you take and that have been prescribed to you. Take any new Medicines after you have completely understood and accpet all the possible adverse reactions/side effects.   Do not drive when taking Pain medications or sleeping medications (Benzodaizepines)  Do not take more  than prescribed Pain, Sleep and Anxiety Medications. It is not advisable to combine anxiety,sleep and pain medications without talking with your primary care practitioner  Special Instructions: If you have smoked or chewed Tobacco  in the last 2 yrs please stop smoking, stop any regular Alcohol  and or any Recreational drug use.  Wear Seat belts while driving.  Please note: You were cared for by a hospitalist during your hospital stay. Once you are discharged, your primary care physician will handle any further medical issues. Please note that NO REFILLS for any discharge medications will be authorized once you are discharged, as it is imperative that you return to your primary care physician (or establish a relationship with a primary care physician if you do not have one) for your post hospital discharge needs so that they can reassess your need for medications and monitor your lab values.  Total Time spent coordinating discharge including counseling, education and face to face time equals 35 minutes.  SignedOren Binet 07/06/2020 12:57 PM

## 2020-07-06 NOTE — Progress Notes (Signed)
Patient was discharged home by MD order; discharged instructions review and give to patient with care notes; IV DIC;  patient will be escorted to the car by nurse tech via wheelchair.  

## 2020-07-06 NOTE — Progress Notes (Signed)
SATURATION QUALIFICATIONS: (This note is used to comply with regulatory documentation for home oxygen)  Patient Saturations on Room Air at Rest = 84%   Patient Saturations on 3 Liters of oxygen while Ambulating = 90%  Please briefly explain why patient needs home oxygen: Pt desaturated to 84% on RA at rest. Required 3L to maintain adequate sats from 90-92% during mobility tasks.   Reuel Derby, PT, DPT  Acute Rehabilitation Services  Pager: [phone] Office: [phone]

## 2020-07-06 NOTE — Evaluation (Signed)
Physical Therapy Evaluation Patient Details Name: [name] May MRN: [id number] DOB: 1941/05/05 Today's Date: 07/06/2020   History of Present Illness  Pt is a 79 y/o female admitted secondary to generalized weakness, and fatigue. Found to be COVID +. Was vaccinated in February per notes. PMH includes PE, COPD, and scoliosis.   Clinical Impression  Pt admitted secondary to problem above with deficits below. Pt oxygen saturations at 84% on RA at rest upon entry to room. Applied 3L and oxygen sats at 90-92% throughout mobility tasks. Pt overall at a supervision level for mobility tasks using RW. Reports family is going to check on her daily at d/c. Reviewed HEP with pt and gave handout. Pt reports she feels she does not need HHPT. Will continue to follow acutely to maximize functional mobility independence and safety.     Follow Up Recommendations No PT follow up (gave HEP; pt reports she does not feel she needs HHPT)    Equipment Recommendations  None recommended by PT    Recommendations for Other Services       Precautions / Restrictions Precautions Precautions: Fall Restrictions Weight Bearing Restrictions: No      Mobility  Bed Mobility Overal bed mobility: Modified Independent                Transfers Overall transfer level: Needs assistance Equipment used: Rolling walker (2 wheeled) Transfers: Sit to/from Stand Sit to Stand: Supervision         General transfer comment: Supervision for safety and line management.   Ambulation/Gait Ambulation/Gait assistance: Supervision Gait Distance (Feet): 100 Feet Assistive device: Rolling walker (2 wheeled) Gait Pattern/deviations: Step-through pattern;Decreased stride length Gait velocity: Decreased   General Gait Details: OVerall steady gait. No LOB noted with use of RW. No SOB noted. Oxygen sats ranging from 90-92% on 3L throughout.   Stairs            Wheelchair Mobility    Modified Rankin (Stroke Patients  Only)       Balance Overall balance assessment: Needs assistance Sitting-balance support: No upper extremity supported;Feet supported Sitting balance-Leahy Scale: Good     Standing balance support: Bilateral upper extremity supported;During functional activity Standing balance-Leahy Scale: Poor Standing balance comment: Reliant on BUE support                              Pertinent Vitals/Pain Pain Assessment: No/denies pain    Home Living Family/patient expects to be discharged to:: Private residence Living Arrangements: Alone Available Help at Discharge: Family;Available PRN/intermittently Type of Home: House Home Access: Stairs to enter Entrance Stairs-Rails: Right;Left;Can reach both Entrance Stairs-Number of Steps: 1 Home Layout: One level Home Equipment: Walker - 4 wheels;Cane - single point;Grab bars - tub/shower Additional Comments: Reports family will be fixing her meals.     Prior Function Level of Independence: Independent with assistive device(s)         Comments: USes cane when going out. Inside is independent. INdependent with bathing and dressing      Hand Dominance        Extremity/Trunk Assessment   Upper Extremity Assessment Upper Extremity Assessment: Overall WFL for tasks assessed    Lower Extremity Assessment Lower Extremity Assessment: Generalized weakness    Cervical / Trunk Assessment Cervical / Trunk Assessment: Other exceptions Cervical / Trunk Exceptions: hx of scoliosis  Communication   Communication: No difficulties  Cognition Arousal/Alertness: Awake/alert Behavior During Therapy: WFL for tasks assessed/performed Overall  Cognitive Status: Within Functional Limits for tasks assessed                                        General Comments General comments (skin integrity, edema, etc.): Pt oxygen saturations at 84% on RA at rest with good waveform. Applied 3L and oxygen sats from 90-92% during  mobility. Notified RN.     Exercises     Assessment/Plan    PT Assessment Patient needs continued PT services  PT Problem List Decreased strength;Decreased mobility;Decreased activity tolerance;Cardiopulmonary status limiting activity       PT Treatment Interventions DME instruction;Gait training;Stair training;Functional mobility training;Therapeutic activities;Therapeutic exercise;Balance training;Patient/family education    PT Goals (Current goals can be found in the Care Plan section)  Acute Rehab PT Goals Patient Stated Goal: to go home PT Goal Formulation: With patient Time For Goal Achievement: 07/20/20 Potential to Achieve Goals: Good    Frequency Min 3X/week   Barriers to discharge        Co-evaluation               AM-PAC PT "6 Clicks" Mobility  Outcome Measure Help needed turning from your back to your side while in a flat bed without using bedrails?: None Help needed moving from lying on your back to sitting on the side of a flat bed without using bedrails?: A Little Help needed moving to and from a bed to a chair (including a wheelchair)?: A Little Help needed standing up from a chair using your arms (e.g., wheelchair or bedside chair)?: A Little Help needed to walk in hospital room?: A Little Help needed climbing 3-5 steps with a railing? : A Little 6 Click Score: 19    End of Session Equipment Utilized During Treatment: Gait belt Activity Tolerance: Patient tolerated treatment well Patient left: in bed;with call bell/phone within reach Nurse Communication: Mobility status (oxygen sats) PT Visit Diagnosis: Muscle weakness (generalized) (M62.81);Unsteadiness on feet (R26.81)    Time: 8182-9937 PT Time Calculation (min) (ACUTE ONLY): 27 min   Charges:   PT Evaluation $PT Eval Moderate Complexity: 1 Mod PT Treatments $Gait Training: 8-22 mins        Lou Miner, DPT  Acute Rehabilitation Services  Pager: [phone] Office: [phone]   Rudean Hitt 07/06/2020, 12:14 PM

## 2020-07-06 NOTE — TOC Transition Note (Signed)
Transition of Care [facility]) - CM/SW Discharge Note   Patient Details  Name: [name] May MRN: [id number] Date of Birth: 1941/06/07  [facility] CM/SW Contact:  Carles Collet, RN Phone Number: 07/06/2020, 2:22 PM   Clinical Narrative:   Damaris Schooner w patient o discuss DC plan. She states that she has oxygen at home through [facility]. CM sent [employer] new order with increased Fio2 of 3L. Patient has tanks and concentrator at home. Patient states that she will have a ride home. CM requested [employer] to bring tank for transport home to the room. Patient declined HH.     Final next level of care: Home/Self Care Barriers to Discharge: No Barriers Identified   Patient Goals and CMS Choice Patient states their goals for this hospitalization and ongoing recovery are:: to go home      Discharge Placement                       Discharge Plan and Services                          HH Arranged: Refused HH          Social Determinants of Health (SDOH) Interventions     Readmission Risk Interventions No flowsheet data found.

## 2020-07-07 NOTE — Discharge Summary (Signed)
PATIENT DETAILS Name: [name] May Age: 79 y.o. Sex: female Date of Birth: January 08, 1941 MRN: [id number]. Admitting Physician: Mckinley Jewel, MD [id number], Legrand Como, MD  Admit Date: 07/01/2020 Discharge date: 07/06/2020  Recommendations for Outpatient Follow-up:  1. Follow up with PCP in 1-2 weeks 2. Please obtain CMP/CBC in one week 3. Repeat Chest Xray in 4-6 week 4. Please reassess if patient still requires home O2 at next visit.  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  She was diagnosed with COVID-19 and a chest x-ray showed bibasilar infiltrates.  She is vaccinated for Covid with the last dose mid-February.  Brief Hospital Course: Acute Hypoxic Respiratory Failure secondary to COVID-19 pneumonia: Treated with steroids and Remdesivir-titrated off oxygen to room air.  However does require O2 with ambulation-see PT note.  She is asymptomatic at rest.  Clinically significantly improved.  Stable for discharge on tapering steroids.  COVID-19 Labs:  Recent Labs    07/05/20 0813 07/06/20 0920  DDIMER 0.63* 0.46  FERRITIN 84 78  CRP <0.5 0.7    Lab Results  Component Value Date   SARSCOV2NAA POSITIVE (A) 07/01/2020     Right-sided chest pain-occurred on 8/24-troponins were negative.  CTA chest negative.  Likely atypical and related to musculoskeletal injury in the setting of COVID-19 pneumonia and coughing.  Chest pain has resolved.   Stable for discharge-continue Eliquis.    Acute kidney injury-likely hemodynamically mediated-resolved.  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  She also has kyphosis suggesting a restrictive process as well  Restless leg syndrome / chronic pain / migraine headache / other medical problems - continue home medications  Obesity: Estimated body mass index is 32.49 kg/m as calculated from the following:   Height as of this encounter: 4\' 9"  (1.448 m).   Weight as of this encounter: 68.1 kg.   Discharge Diagnoses:  Principal Problem:   Pneumonia due to COVID-19 virus Active Problems:   GERD (gastroesophageal reflux disease)   Acute respiratory failure with hypoxia (HCC)   Acute kidney injury ([place])   Macrocytic anemia   Thrombocytopenia (HCC)   Acute hypoxemic respiratory failure due to COVID-19 [facility])   Discharge Instructions:    Person Under Monitoring Name: [name] May  Location: [place] [address]   Infection Prevention Recommendations for Individuals Confirmed to have, or Being Evaluated for, 2019 Novel Coronavirus (COVID-19) Infection Who Receive Care at Home  Individuals who are confirmed to have, or are being evaluated for, COVID-19 should follow the prevention steps below until a healthcare provider or local or state health department says they can return to normal activities.  Stay home except to get medical care You should restrict activities outside your home, except for getting medical care.  Do not go to work, school, or public areas, and do not use public transportation or taxis.  Call ahead before visiting your doctor Before your medical appointment, call the healthcare provider and tell them that you have, or are being evaluated  for, COVID-19 infection. This will help the healthcare provider's office take steps to keep other people from getting infected. Ask your healthcare provider to call the local or state health department.  Monitor your symptoms Seek prompt medical attention if your illness is worsening (e.g., difficulty breathing). Before going to your medical appointment, call the healthcare provider and tell them that you have, or are being evaluated for, COVID-19 infection. Ask your healthcare provider to call the local or state health department.  Wear a facemask You should wear a facemask that covers your nose and mouth when you are in the same room with other people and when you visit a healthcare provider. People who live with or visit you should also wear a facemask while they are in the same room with you.  Separate yourself from other people in your home As much as possible, you should stay in a different room from other people in your home. Also, you should use a separate bathroom, if available.  Avoid sharing household items You should not share dishes, drinking glasses, cups, eating utensils, towels, bedding, or other items with other people in your home. After using these items, you should wash them thoroughly with soap and water.  Cover your coughs and sneezes Cover your mouth and nose with a tissue when you cough or sneeze, or you can cough or sneeze into your sleeve. Throw used tissues in a lined trash can, and immediately wash your hands with soap and water for at least 20 seconds or use an alcohol-based hand rub.  Wash your [facility] your hands often and thoroughly with soap and water for at least 20 seconds. You can use an alcohol-based hand sanitizer if soap and water are not available and if your hands are not visibly dirty. Avoid touching your eyes, nose, and mouth with unwashed hands.   Prevention Steps for Caregivers and Household Members of Individuals Confirmed to have, or  Being Evaluated for, COVID-19 Infection Being Cared for in the Home  If you live with, or provide care at home for, a person confirmed to have, or being evaluated for, COVID-19 infection please follow these guidelines to prevent infection:  Follow healthcare provider's instructions Make sure that you understand and can help the patient follow any healthcare provider instructions for all care.  Provide for the patient's basic needs You should help the patient with basic needs in the home and provide support for getting groceries, prescriptions, and other personal needs.  Monitor the patient's symptoms If they are getting sicker, call his or her medical provider and tell them that the patient has, or is being evaluated for, COVID-19 infection. This will help the healthcare provider's office take steps to keep other people from getting infected. Ask the healthcare provider to call the local or state health department.  Limit the number of people who have contact with the patient  If possible, have only one caregiver for the patient.  Other household members should stay in another home or place of residence. If this is not possible, they should stay  in another room, or be separated from the patient as much as possible. Use a separate bathroom, if available.  Restrict visitors who do not have an essential need to be  in the home.  Keep older adults, very young children, and other sick people away from the patient Keep older adults, very young children, and those who have compromised immune systems or chronic health conditions away from the patient. This includes people with chronic heart, lung, or kidney conditions, diabetes, and cancer.  Ensure good ventilation Make sure that shared spaces in the home have good air flow, such as from an air conditioner or an opened window, weather permitting.  Wash your hands often  Wash your hands often and thoroughly with soap and water for at least  20 seconds. You can use an alcohol based hand sanitizer if soap and water are not available and if your hands are not visibly dirty.  Avoid touching your eyes, nose, and mouth with unwashed hands.  Use disposable paper towels to dry your hands. If not available, use dedicated cloth towels and replace them when they become wet.  Wear a facemask and gloves  Wear a disposable facemask at all times in the room and gloves when you touch or have contact with the patient's blood, body fluids, and/or secretions or excretions, such as sweat, saliva, sputum, nasal mucus, vomit, urine, or feces.  Ensure the mask fits over your nose and mouth tightly, and do not touch it during use.  Throw out disposable facemasks and gloves after using them. Do not reuse.  Wash your hands immediately after removing your facemask and gloves.  If your personal clothing becomes contaminated, carefully remove clothing and launder. Wash your hands after handling contaminated clothing.  Place all used disposable facemasks, gloves, and other waste in a lined container before disposing them with other household waste.  Remove gloves and wash your hands immediately after handling these items.  Do not share dishes, glasses, or other household items with the patient  Avoid sharing household items. You should not share dishes, drinking glasses, cups, eating utensils, towels, bedding, or other items with a patient who is confirmed to have, or being evaluated for, COVID-19 infection.  After the person uses these items, you should wash them thoroughly with soap and water.  Wash laundry thoroughly  Immediately remove and wash clothes or bedding that have blood, body fluids, and/or secretions or excretions, such as sweat, saliva, sputum, nasal mucus, vomit, urine, or feces, on them.  Wear gloves when handling laundry from the patient.  Read and follow directions on labels of laundry or clothing items and detergent. In general,  wash and dry with the warmest temperatures recommended on the label.  Clean all areas the individual has used often  Clean all touchable surfaces, such as counters, tabletops, doorknobs, bathroom fixtures, toilets, phones, keyboards, tablets, and bedside tables, every day. Also, clean any surfaces that may have blood, body fluids, and/or secretions or excretions on them.  Wear gloves when cleaning surfaces the patient has come in contact with.  Use a diluted bleach solution (e.g., dilute bleach with 1 part bleach and 10 parts water) or a household disinfectant with a label that says EPA-registered for coronaviruses. To make a bleach solution at home, add 1 tablespoon of bleach to 1 quart (4 cups) of water. For a larger supply, add  cup of bleach to 1 gallon (16 cups) of water.  Read labels of cleaning products and follow recommendations provided on product labels. Labels contain instructions for safe and effective use of the cleaning product including precautions you should take when applying the product, such as wearing gloves or eye protection and making sure  you have good ventilation during use of the product.  Remove gloves and wash hands immediately after cleaning.  Monitor yourself for signs and symptoms of illness Caregivers and household members are considered close contacts, should monitor their health, and will be asked to limit movement outside of the home to the extent possible. Follow the monitoring steps for close contacts listed on the symptom monitoring form.   ? If you have additional questions, contact your local health department or call the epidemiologist on call at [phone] (available 24/7). ? This guidance is subject to change. For the most up-to-date guidance from CDC, please refer to their website: YouBlogs.pl    Activity:  As tolerated  Discharge Instructions    Call MD for:  difficulty  breathing, headache or visual disturbances   Complete by: As directed    Diet - low sodium heart healthy   Complete by: As directed    Discharge instructions   Complete by: As directed    Follow with Primary MD  Jefm Petty, MD in 1-2 weeks  Please ask your primary care practitioner to reassess with you require home O2 at next visit.  Please get a complete blood count and chemistry panel checked by your Primary MD at your next visit, and again as instructed by your Primary MD.  Get Medicines reviewed and adjusted: Please take all your medications with you for your next visit with your Primary MD  Laboratory/radiological data: Please request your Primary MD to go over all hospital tests and procedure/radiological results at the follow up, please ask your Primary MD to get all Hospital records sent to his/her office.  In some cases, they will be blood work, cultures and biopsy results pending at the time of your discharge. Please request that your primary care M.D. follows up on these results.  Also Note the following: If you experience worsening of your admission symptoms, develop shortness of breath, life threatening emergency, suicidal or homicidal thoughts you must seek medical attention immediately by calling 911 or calling your MD immediately  if symptoms less severe.  You must read complete instructions/literature along with all the possible adverse reactions/side effects for all the Medicines you take and that have been prescribed to you. Take any new Medicines after you have completely understood and accpet all the possible adverse reactions/side effects.   Do not drive when taking Pain medications or sleeping medications (Benzodaizepines)  Do not take more than prescribed Pain, Sleep and Anxiety Medications. It is not advisable to combine anxiety,sleep and pain medications without talking with your primary care practitioner  Special Instructions: If you have smoked or chewed  Tobacco  in the last 2 yrs please stop smoking, stop any regular Alcohol  and or any Recreational drug use.  Wear Seat belts while driving.  Please note: You were cared for by a hospitalist during your hospital stay. Once you are discharged, your primary care physician will handle any further medical issues. Please note that NO REFILLS for any discharge medications will be authorized once you are discharged, as it is imperative that you return to your primary care physician (or establish a relationship with a primary care physician if you do not have one) for your post hospital discharge needs so that they can reassess your need for medications and monitor your lab values.   Discharge instructions   Complete by: As directed    Follow with Primary MD  Jefm Petty, MD in 1-2 weeks  3 weeks of isolation from 07/01/2020  Please ask your primary care practitioner to reassess whether you require home O2 at next visit.  Please get a complete blood count and chemistry panel checked by your Primary MD at your next visit, and again as instructed by your Primary MD.  Get Medicines reviewed and adjusted: Please take all your medications with you for your next visit with your Primary MD  Laboratory/radiological data: Please request your Primary MD to go over all hospital tests and procedure/radiological results at the follow up, please ask your Primary MD to get all Hospital records sent to his/her office.  In some cases, they will be blood work, cultures and biopsy results pending at the time of your discharge. Please request that your primary care M.D. follows up on these results.  Also Note the following: If you experience worsening of your admission symptoms, develop shortness of breath, life threatening emergency, suicidal or homicidal thoughts you must seek medical attention immediately by calling 911 or calling your MD immediately  if symptoms less severe.  You must read complete  instructions/literature along with all the possible adverse reactions/side effects for all the Medicines you take and that have been prescribed to you. Take any new Medicines after you have completely understood and accpet all the possible adverse reactions/side effects.   Do not drive when taking Pain medications or sleeping medications (Benzodaizepines)  Do not take more than prescribed Pain, Sleep and Anxiety Medications. It is not advisable to combine anxiety,sleep and pain medications without talking with your primary care practitioner  Special Instructions: If you have smoked or chewed Tobacco  in the last 2 yrs please stop smoking, stop any regular Alcohol  and or any Recreational drug use.  Wear Seat belts while driving.  Please note: You were cared for by a hospitalist during your hospital stay. Once you are discharged, your primary care physician will handle any further medical issues. Please note that NO REFILLS for any discharge medications will be authorized once you are discharged, as it is imperative that you return to your primary care physician (or establish a relationship with a primary care physician if you do not have one) for your post hospital discharge needs so that they can reassess your need for medications and monitor your lab values.   Increase activity slowly   Complete by: As directed    Increase activity slowly   Complete by: As directed      Allergies as of 07/06/2020      Reactions   Latex Itching, Rash, Other (See Comments)   Blisters for months on legs   Mold Extract [trichophyton] Anaphylaxis, Swelling, Rash   Molds & Smuts Anaphylaxis, Swelling, Rash   Vancomycin Other (See Comments)   05/01/17: Pt noted to c/o redness and burning to R arm during IV Vancomycin 1g infusion. Infusion stopped and diphenhydramine 25mg  IV given.    Cephalexin Rash      Medication List    STOP taking these medications   Besifloxacin HCl 0.6 % Susp   Bromfenac Sodium 0.07  % Soln   CALCIUM + D PO   Calcium 500 + D 500-125 MG-UNIT Tabs Generic drug: Calcium Carbonate-Vitamin D   Calcium 500-125 MG-UNIT Tabs   clorazepate 3.75 MG tablet Commonly known as: TRANXENE   Difluprednate 0.05 % Emul   ferrous sulfate 325 (65 FE) MG EC tablet   furosemide 20 MG tablet Commonly known as: LASIX   ketoconazole 2 % cream Commonly known as: NIZORAL   MAGnesium-Oxide 400 (241.3 Mg)  MG tablet Generic drug: magnesium oxide   oxyCODONE-acetaminophen 5-325 MG tablet Commonly known as: PERCOCET/ROXICET   rOPINIRole 2 MG tablet Commonly known as: REQUIP     TAKE these medications   acetaminophen 650 MG CR tablet Commonly known as: TYLENOL Take 1,300 mg by mouth every 8 (eight) hours as needed for pain (or headaches).   albuterol 108 (90 Base) MCG/ACT inhaler Commonly known as: VENTOLIN HFA Inhale 2 puffs into the lungs every 6 (six) hours as needed for wheezing or shortness of breath.   alendronate 70 MG tablet Commonly known as: FOSAMAX Take 70 mg by mouth every Sunday. Take with a full glass of water on an empty stomach.   ascorbic acid 500 MG tablet Commonly known as: VITAMIN C Take 500 mg by mouth daily. What changed: Another medication with the same name was removed. Continue taking this medication, and follow the directions you see here.   benzonatate 100 MG capsule Commonly known as: Tessalon Perles Take 1 capsule (100 mg total) by mouth every 6 (six) hours as needed for cough.   buPROPion 300 MG 24 hr tablet Commonly known as: WELLBUTRIN XL Take 300 mg by mouth in the morning.   CALCIUM + D3 PO Take 1 tablet by mouth daily with breakfast.   dicyclomine 20 MG tablet Commonly known as: BENTYL Take 20 mg by mouth every 6 (six) hours as needed for spasms.   Eliquis 2.5 MG Tabs tablet Generic drug: apixaban Take 2.5 mg by mouth 2 (two) times daily.   gabapentin 600 MG tablet Commonly known as: NEURONTIN Take 600 mg by mouth See admin  instructions. Take 600 mg by mouth at bedtime and an additional 600 mg at 6 PM, as needed/as directed   HYDROcodone-acetaminophen 10-325 MG tablet Commonly known as: NORCO Take 0.5-1 tablets by mouth daily as needed for moderate pain.   hydrocortisone 2.5 % cream Apply 1 application topically daily as needed (eczema).   Incruse Ellipta 62.5 MCG/INH Aepb Generic drug: umeclidinium bromide Inhale 1 puff into the lungs every evening.   ketotifen 0.025 % ophthalmic solution Commonly known as: ZADITOR Place 1 drop into both eyes 3 (three) times daily as needed (for itching).   lamoTRIgine 200 MG tablet Commonly known as: LAMICTAL Take 200 mg by mouth at bedtime.   loperamide 2 MG tablet Commonly known as: IMODIUM A-D Take 2 mg by mouth 4 (four) times daily as needed for diarrhea or loose stools.   loratadine 10 MG tablet Commonly known as: CLARITIN Take 10 mg by mouth daily.   LORazepam 0.5 MG tablet Commonly known as: ATIVAN Take 0.5 mg by mouth in the morning and at bedtime.   magnesium oxide 400 MG tablet Commonly known as: MAG-OX Take 400 mg by mouth daily with breakfast.   methocarbamol 500 MG tablet Commonly known as: ROBAXIN Take 1 tablet (500 mg total) by mouth 3 (three) times daily.   Nu-Iron 150 MG capsule Generic drug: iron polysaccharides Take 150 mg by mouth 2 (two) times daily with a meal.   pantoprazole 40 MG tablet Commonly known as: PROTONIX Take 40 mg by mouth 2 (two) times daily.   pramipexole 0.5 MG tablet Commonly known as: MIRAPEX Take 0.5 mg by mouth See admin instructions. Take 0.5 mg by mouth at bedtime and additional 0.5 mg at 6 PM, if needed for RLS   predniSONE 10 MG tablet Commonly known as: DELTASONE Take 40 mg daily for 1 day, 30 mg daily for 1 day, 20  mg daily for 1 days,10 mg daily for 1 day, then stop   promethazine 25 MG tablet Commonly known as: PHENERGAN Take 25 mg by mouth every 8 (eight) hours as needed for nausea or  vomiting.   SUMAtriptan 50 MG tablet Commonly known as: IMITREX Take 50 mg by mouth once as needed for migraine (and may repeat once in 2 hours, if no relief). What changed: Another medication with the same name was removed. Continue taking this medication, and follow the directions you see here.   traMADol 50 MG tablet Commonly known as: ULTRAM Take 50 mg by mouth 2 (two) times daily as needed (for pain).       Follow-up Information    Jefm Petty, MD. Schedule an appointment as soon as possible for a visit in 1 week(s).   Specialty: Family Medicine Contact information: [address] [place] [address] [phone]              Allergies  Allergen Reactions  . Latex Itching, Rash and Other (See Comments)    Blisters for months on legs  . Mold Extract [Trichophyton] Anaphylaxis, Swelling and Rash  . Molds & Smuts Anaphylaxis, Swelling and Rash  . Vancomycin Other (See Comments)    05/01/17: Pt noted to c/o redness and burning to R arm during IV Vancomycin 1g infusion. Infusion stopped and diphenhydramine 25mg  IV given.   . Cephalexin Rash     Other Procedures/Studies: CT ANGIO CHEST PE W OR WO CONTRAST  Result Date: 07/05/2020 CLINICAL DATA:  Positive D-dimer.  Shortness of breath. EXAM: CT ANGIOGRAPHY CHEST WITH CONTRAST TECHNIQUE: Multidetector CT imaging of the chest was performed using the standard protocol during bolus administration of intravenous contrast. Multiplanar CT image reconstructions and MIPs were obtained to evaluate the vascular anatomy. CONTRAST:  12mL OMNIPAQUE IOHEXOL 350 MG/ML SOLN COMPARISON:  None. FINDINGS: Cardiovascular: Satisfactory opacification of the pulmonary arteries to the segmental level. No evidence of pulmonary embolism. Normal heart size. No pericardial effusion. Thoracic aortic atherosclerosis. Mediastinum/Nodes: No enlarged mediastinal, hilar, or axillary lymph nodes. Thyroid gland, trachea, and esophagus  demonstrate no significant findings. Lungs/Pleura: Trace bilateral pleural effusions. Right basilar and right perihilar atelectasis. Patchy ground-glass airspace disease in the right middle lobe. Left basilar airspace disease which may reflect atelectasis versus pneumonia. Mild bilateral centrilobular emphysema. No pneumothorax. Upper Abdomen: No acute abnormality.  Large hiatal hernia. Musculoskeletal: Severe dextroscoliosis of the thoracolumbar spine. Chronic T12, T9, T7 vertebral body compression fractures. Degenerative disease with disc height loss and facet arthropathy of the cervicothoracic spine. Review of the MIP images confirms the above findings. IMPRESSION: 1. No evidence of pulmonary embolus. 2. Left basilar airspace disease which may reflect atelectasis versus pneumonia. Patchy ground-glass airspace disease in the right middle lobe concerning for pneumonia. 3. Trace bilateral pleural effusions. 4. Aortic Atherosclerosis (ICD10-I70.0) and Emphysema (ICD10-J43.9). 5. Large hiatal hernia. Electronically Signed   By: Kathreen Devoid   On: 07/05/2020 12:59   DG CHEST PORT 1 VIEW  Result Date: 07/05/2020 CLINICAL DATA:  Chest pain, COVID, COPD. EXAM: PORTABLE CHEST 1 VIEW COMPARISON:  Chest x-rays dated 07/01/2020 and 11/14/2018. FINDINGS: Stable cardiomegaly. Overall cardiomediastinal silhouette appears stable. Stable chronic streaky opacities at each lung base, most likely chronic atelectasis and/or scarring. No evidence of pneumonia on today's exam. No pleural effusion or pneumothorax is seen. No acute appearing osseous abnormality. IMPRESSION: 1. No active disease. No evidence of pneumonia on today's exam. 2. Stable cardiomegaly. Electronically Signed   By: Cherlynn Kaiser  Enriqueta Shutter M.D.   On: 07/05/2020 08:15   DG Chest Port 1 View  Result Date: 07/01/2020 CLINICAL DATA:  Shortness of breath, cough and fever. Symptoms began 3 days ago. EXAM: PORTABLE CHEST 1 VIEW COMPARISON:  11/14/2018 FINDINGS: Chronic  cardiomegaly. Chronic aortic atherosclerosis. Chronic scoliosis with chest deformity subsequent to that. Areas of patchy atelectasis and or infiltrate at both lung bases. Upper lungs appear clear. IMPRESSION: Patchy atelectasis or infiltrate at both lung bases. Cardiomegaly. Aortic atherosclerosis. Electronically Signed   By: Nelson Chimes M.D.   On: 07/01/2020 15:40     TODAY-DAY OF DISCHARGE:  Subjective:   [name] today has no headache,no chest abdominal pain,no new weakness tingling or numbness, feels much better wants to go home today.   Objective:   Blood pressure 131/77, pulse 87, temperature 97.9 F (36.6 C), temperature source Oral, resp. rate 20, height 4\' 9"  (1.448 m), weight 68.1 kg, SpO2 90 %.  Intake/Output Summary (Last 24 hours) at 07/07/2020 1523 Last data filed at 07/06/2020 1736 Gross per 24 hour  Intake 240 ml  Output --  Net 240 ml   Filed Weights   07/01/20 1630 07/06/20 0957  Weight: 68 kg 68.1 kg    Exam: Awake Alert, Oriented *3, No new F.N deficits, Normal affect [place].AT,PERRAL Supple Neck,No JVD, No cervical lymphadenopathy appriciated.  Symmetrical Chest wall movement, Good air movement bilaterally, CTAB RRR,No Gallops,Rubs or new Murmurs, No Parasternal Heave +ve B.Sounds, Abd Soft, Non tender, No organomegaly appriciated, No rebound -guarding or rigidity. No Cyanosis, Clubbing or edema, No new Rash or bruise   PERTINENT RADIOLOGIC STUDIES: CT ANGIO CHEST PE W OR WO CONTRAST  Result Date: 07/05/2020 CLINICAL DATA:  Positive D-dimer.  Shortness of breath. EXAM: CT ANGIOGRAPHY CHEST WITH CONTRAST TECHNIQUE: Multidetector CT imaging of the chest was performed using the standard protocol during bolus administration of intravenous contrast. Multiplanar CT image reconstructions and MIPs were obtained to evaluate the vascular anatomy. CONTRAST:  23mL OMNIPAQUE IOHEXOL 350 MG/ML SOLN COMPARISON:  None. FINDINGS: Cardiovascular: Satisfactory opacification  of the pulmonary arteries to the segmental level. No evidence of pulmonary embolism. Normal heart size. No pericardial effusion. Thoracic aortic atherosclerosis. Mediastinum/Nodes: No enlarged mediastinal, hilar, or axillary lymph nodes. Thyroid gland, trachea, and esophagus demonstrate no significant findings. Lungs/Pleura: Trace bilateral pleural effusions. Right basilar and right perihilar atelectasis. Patchy ground-glass airspace disease in the right middle lobe. Left basilar airspace disease which may reflect atelectasis versus pneumonia. Mild bilateral centrilobular emphysema. No pneumothorax. Upper Abdomen: No acute abnormality.  Large hiatal hernia. Musculoskeletal: Severe dextroscoliosis of the thoracolumbar spine. Chronic T12, T9, T7 vertebral body compression fractures. Degenerative disease with disc height loss and facet arthropathy of the cervicothoracic spine. Review of the MIP images confirms the above findings. IMPRESSION: 1. No evidence of pulmonary embolus. 2. Left basilar airspace disease which may reflect atelectasis versus pneumonia. Patchy ground-glass airspace disease in the right middle lobe concerning for pneumonia. 3. Trace bilateral pleural effusions. 4. Aortic Atherosclerosis (ICD10-I70.0) and Emphysema (ICD10-J43.9). 5. Large hiatal hernia. Electronically Signed   By: Kathreen Devoid   On: 07/05/2020 12:59   DG CHEST PORT 1 VIEW  Result Date: 07/05/2020 CLINICAL DATA:  Chest pain, COVID, COPD. EXAM: PORTABLE CHEST 1 VIEW COMPARISON:  Chest x-rays dated 07/01/2020 and 11/14/2018. FINDINGS: Stable cardiomegaly. Overall cardiomediastinal silhouette appears stable. Stable chronic streaky opacities at each lung base, most likely chronic atelectasis and/or scarring. No evidence of pneumonia on today's exam. No pleural effusion or pneumothorax is seen. No  acute appearing osseous abnormality. IMPRESSION: 1. No active disease. No evidence of pneumonia on today's exam. 2. Stable cardiomegaly.  Electronically Signed   By: Franki Cabot M.D.   On: 07/05/2020 08:15   DG Chest Port 1 View  Result Date: 07/01/2020 CLINICAL DATA:  Shortness of breath, cough and fever. Symptoms began 3 days ago. EXAM: PORTABLE CHEST 1 VIEW COMPARISON:  11/14/2018 FINDINGS: Chronic cardiomegaly. Chronic aortic atherosclerosis. Chronic scoliosis with chest deformity subsequent to that. Areas of patchy atelectasis and or infiltrate at both lung bases. Upper lungs appear clear. IMPRESSION: Patchy atelectasis or infiltrate at both lung bases. Cardiomegaly. Aortic atherosclerosis. Electronically Signed   By: Nelson Chimes M.D.   On: 07/01/2020 15:40     PERTINENT LAB RESULTS: CBC: Recent Labs    07/05/20 0813 07/06/20 0920  WBC 7.7 6.4  HGB 12.6 13.0  HCT 40.2 41.7  PLT 183 209   CMET CMP     Component Value Date/Time   NA 136 07/06/2020 0920   K 4.1 07/06/2020 0920   CL 98 07/06/2020 0920   CO2 30 07/06/2020 0920   GLUCOSE 175 (H) 07/06/2020 0920   BUN 20 07/06/2020 0920   CREATININE 0.84 07/06/2020 0920   CALCIUM 8.7 (L) 07/06/2020 0920   PROT 7.8 07/06/2020 0920   ALBUMIN 3.1 (L) 07/06/2020 0920   AST 26 07/06/2020 0920   ALT 24 07/06/2020 0920   ALKPHOS 38 07/06/2020 0920   BILITOT 0.4 07/06/2020 0920   GFRNONAA >60 07/06/2020 0920   GFRAA >60 07/06/2020 0920    GFR Estimated Creatinine Clearance: 43.9 mL/min (by C-G formula based on SCr of 0.84 mg/dL). No results for input(s): LIPASE, AMYLASE in the last 72 hours. No results for input(s): CKTOTAL, CKMB, CKMBINDEX, TROPONINI in the last 72 hours. Invalid input(s): POCBNP Recent Labs    07/05/20 0813 07/06/20 0920  DDIMER 0.63* 0.46   No results for input(s): HGBA1C in the last 72 hours. No results for input(s): CHOL, HDL, LDLCALC, TRIG, CHOLHDL, LDLDIRECT in the last 72 hours. No results for input(s): TSH, T4TOTAL, T3FREE, THYROIDAB in the last 72 hours.  Invalid input(s): FREET3 Recent Labs    07/05/20 0813 07/06/20 0920   FERRITIN 84 78   Coags: No results for input(s): INR in the last 72 hours.  Invalid input(s): PT Microbiology: Recent Results (from the past 240 hour(s))  SARS Coronavirus 2 by RT PCR (hospital order, performed in [facility] hospital lab) Nasopharyngeal Nasopharyngeal Swab     Status: Abnormal   Collection Time: 07/01/20  3:00 PM   Specimen: Nasopharyngeal Swab  Result Value Ref Range Status   SARS Coronavirus 2 POSITIVE (A) NEGATIVE Final    Comment: RESULT CALLED TO, READ BACK BY AND VERIFIED WITH: E DIXON RN 07/01/20 AT 1757 SK (NOTE) SARS-CoV-2 target nucleic acids are DETECTED  SARS-CoV-2 RNA is generally detectable in upper respiratory specimens  during the acute phase of infection.  Positive results are indicative  of the presence of the identified virus, but do not rule out bacterial infection or co-infection with other pathogens not detected by the test.  Clinical correlation with patient history and  other diagnostic information is necessary to determine patient infection status.  The expected result is negative.  Fact Sheet for Patients:   StrictlyIdeas.no   Fact Sheet for Healthcare Providers:   BankingDealers.co.za    This test is not yet approved or cleared by the [place] FDA and  has been authorized for detection and/or diagnosis of  SARS-CoV-2 by FDA under an Emergency Use Authorization (EUA).  This EUA will remain in effect (meaning this test ca n be used) for the duration of  the COVID-19 declaration under Section 564(b)(1) of the Act, 21 U.S.C. section 360-bbb-3(b)(1), unless the authorization is terminated or revoked sooner.  Performed at [facility], [place] [address] [place], [place] [address]   Blood culture (routine single)     Status: None   Collection Time: 07/01/20  4:27 PM   Specimen: BLOOD RIGHT FOREARM  Result Value Ref Range Status   Specimen Description BLOOD RIGHT FOREARM  Final    Special Requests   Final    BOTTLES DRAWN AEROBIC AND ANAEROBIC Blood Culture results may not be optimal due to an excessive volume of blood received in culture bottles   Culture   Final    NO GROWTH 5 DAYS Performed at [facility], [place] [address] [place], [place] [address]    Report Status 07/06/2020 FINAL  Final  Urine culture     Status: Abnormal   Collection Time: 07/01/20  8:35 PM   Specimen: Urine, Clean Catch  Result Value Ref Range Status   Specimen Description URINE, CLEAN CATCH  Final   Special Requests   Final    NONE Performed at [facility], [place] [address], [place], [place] [address]    Culture MULTIPLE SPECIES PRESENT, SUGGEST RECOLLECTION (A)  Final   Report Status 07/03/2020 FINAL  Final    FURTHER DISCHARGE INSTRUCTIONS:  Get Medicines reviewed and adjusted: Please take all your medications with you for your next visit with your Primary MD  Laboratory/radiological data: Please request your Primary MD to go over all hospital tests and procedure/radiological results at the follow up, please ask your Primary MD to get all Hospital records sent to his/her office.  In some cases, they will be blood work, cultures and biopsy results pending at the time of your discharge. Please request that your primary care M.D. goes through all the records of your hospital data and follows up on these results.  Also Note the following: If you experience worsening of your admission symptoms, develop shortness of breath, life threatening emergency, suicidal or homicidal thoughts you must seek medical attention immediately by calling 911 or calling your MD immediately  if symptoms less severe.  You must read complete instructions/literature along with all the possible adverse reactions/side effects for all the Medicines you take and that have been prescribed to you. Take any new Medicines after you have completely understood and accpet all the possible adverse  reactions/side effects.   Do not drive when taking Pain medications or sleeping medications (Benzodaizepines)  Do not take more than prescribed Pain, Sleep and Anxiety Medications. It is not advisable to combine anxiety,sleep and pain medications without talking with your primary care practitioner  Special Instructions: If you have smoked or chewed Tobacco  in the last 2 yrs please stop smoking, stop any regular Alcohol  and or any Recreational drug use.  Wear Seat belts while driving.  Please note: You were cared for by a hospitalist during your hospital stay. Once you are discharged, your primary care physician will handle any further medical issues. Please note that NO REFILLS for any discharge medications will be authorized once you are discharged, as it is imperative that you return to your primary care physician (or establish a relationship with a primary care physician if you do not have one) for your post hospital  discharge needs so that they can reassess your need for medications and monitor your lab values.  Total Time spent coordinating discharge including counseling, education and face to face time equals 35 minutes.  SignedOren Binet 07/07/2020 3:23 PM

## 2020-10-10 ENCOUNTER — Other Ambulatory Visit: Payer: Self-pay | Admitting: General Practice

## 2020-10-10 DIAGNOSIS — Z1231 Encounter for screening mammogram for malignant neoplasm of breast: Secondary | ICD-10-CM

## 2021-05-01 ENCOUNTER — Encounter: Payer: Self-pay | Admitting: Physical Medicine & Rehabilitation

## 2021-06-09 ENCOUNTER — Other Ambulatory Visit: Payer: Self-pay

## 2021-06-09 ENCOUNTER — Encounter: Payer: Medicare Other | Attending: Physical Medicine & Rehabilitation | Admitting: Physical Medicine & Rehabilitation

## 2021-06-09 ENCOUNTER — Encounter: Payer: Self-pay | Admitting: Physical Medicine & Rehabilitation

## 2021-06-09 VITALS — BP 147/85 | HR 99 | Temp 98.2°F | Ht <= 58 in | Wt 151.6 lb

## 2021-06-09 DIAGNOSIS — M47817 Spondylosis without myelopathy or radiculopathy, lumbosacral region: Secondary | ICD-10-CM | POA: Diagnosis not present

## 2021-06-09 DIAGNOSIS — G894 Chronic pain syndrome: Secondary | ICD-10-CM | POA: Insufficient documentation

## 2021-06-09 NOTE — Progress Notes (Signed)
Subjective:    Patient ID: [name] May, female    DOB: [birth year], 80 y.o.   MRN: [id number]  HPI  CC:  Low back pain  80 year old female referred by primary care for the evaluation of chronic low back pain.  She also has pain in the mid back area as well.  Patient states she has had back pain since her 45s.  Pain is described as constant inner fearing with activities as well as enjoyment of life.  Pain is worse during the daytime and increases with walking and standing improves with rest heat ice medications.  She has good relief from medicines but does not need to take them very frequently in terms of narcotic analgesic.  She does take her Tylenol.  Does not note much relief in the past with tramadol.  She uses a cane and sometimes uses a Rollator walker.  This helps her stand up more straight.  She feels like she gets pulled forward but she has not fallen.  She can climb steps.  She does not drive.  She is independent with her self-care skills.  She needs some assistance with household duties but is able to do meal prep. The patient has a history of diarrhea and sees a gastroenterologist. She takes about 4 650 mg tablets of gabapentin per day.  She is on Eliquis for DVT/PE The patient does not exercise on a regular basis She does like to walk and garden.  She has not done physical fit therapy for a long time.  Took an average of 3-4 per month  Hx of L5-S1 lami ~36yr ago  Has Hx of scoliosis and leans forward  Has thoracic as well as lumbar pain  No cervical pain  03/05/2016 Bilateral L3-S1  [facility] Average Pain 7 Pain Right Now 7 My pain is constant  In the last 24 hours, has pain interfered with the following? General activity 7 Relation with others 0 Enjoyment of life 8 What TIME of day is your pain at its worst? daytime Sleep (in general) Poor  Pain is worse with: walking and standing Pain improves with: rest, heat/ice,  and medication Relief from Meds: 7  use a cane use a walker ability to climb steps?  yes do you drive?  no Do you have any goals in this area?  yes  disabled: date disabled . I need assistance with the following:  household duties and shopping  bladder control problems bowel control problems tremor confusion depression anxiety  Any changes since last visit?  no  SPCP/Referral SManuella Ghazi [facility] MD    Family History  Problem Relation Age of Onset   Deep vein thrombosis Neg Hx    Social History   Socioeconomic History   Marital status: Widowed    Spouse name: Not on file   Number of children: Not on file   Years of education: Not on file   Highest education level: Not on file  Occupational History   Not on file  Tobacco Use   Smoking status: Former   Smokeless tobacco: Never   Tobacco comments:    quit 25 years ago   Vaping Use   Vaping Use: Never used  Substance and Sexual Activity   Alcohol use: No   Drug use: No   Sexual activity: Not Currently  Other Topics Concern   Not on file  Social History Narrative   Not on file   Social Determinants  of Health   Financial Resource Strain: Not on file  Food Insecurity: Not on file  Transportation Needs: Not on file  Physical Activity: Not on file  Stress: Not on file  Social Connections: Not on file   Past Surgical History:  Procedure Laterality Date   ABDOMINAL HYSTERECTOMY     [place]  05/16/2017   Procedure: STENT OF BILATERAL COMMON FEMORAL VEIN WITH 16 X 90 WALL STENT;  Surgeon: Waynetta Sandy, MD;  Location: [place];  Service: Vascular;;   hemroidectomy     INSERTION OF ILIAC STENT Bilateral 05/16/2017   Procedure: STENT OF BILATERAL COMMON AND EXTERNAL ILIAC VEIN WITH 18 X 90 WALL STENT;  Surgeon: Waynetta Sandy, MD;  Location: [place];  Service: Vascular;  Laterality: Bilateral;   INTRAVASCULAR  ULTRASOUND/IVUS N/A 05/06/2017   Procedure: INTRAVASCULAR ULTRASOUND/IVUS;  Surgeon: Waynetta Sandy, MD;  Location: [place];  Service: Vascular;  Laterality: N/A;   IVC FILTER INSERTION N/A 05/16/2017   Procedure: MECHANICAL THROMBECTOMY OF IVC FILTER;  Surgeon: Waynetta Sandy, MD;  Location: [place];  Service: Vascular;  Laterality: N/A;   LAMINECTOMY     LOWER EXTREMITY VENOGRAPHY N/A 05/14/2017   Procedure: Lower Extremity Venography;  Surgeon: Serafina Mitchell, MD;  Location: [facility];  Service: Cardiovascular;  Laterality: N/A;   NASAL SINUS SURGERY     PERCUTANEOUS VENOUS THROMBECTOMY,LYSIS WITH INTRAVASCULAR ULTRASOUND (IVUS) N/A 05/16/2017   Procedure: PERCUTANEOUS VENOUS THROMBECTOMY AND LYSIS WITH INTRAVASCULAR ULTRASOUND (IVUS);  Surgeon: Waynetta Sandy, MD;  Location: [place];  Service: Vascular;  Laterality: N/A;   VENA CAVA FILTER PLACEMENT N/A 05/16/2017   Procedure: RETRIEVAL INFERIOR VENA-CAVA FILTER;  Surgeon: Waynetta Sandy, MD;  Location: [place];  Service: Vascular;  Laterality: N/A;   VENOGRAM Bilateral 05/16/2017   Procedure: VENOGRAM OF IVC;  Surgeon: Waynetta Sandy, MD;  Location: [place];  Service: Vascular;  Laterality: Bilateral;   Past Medical History:  Diagnosis Date   Allergy    Anemia    Arthritis    left knee   Back pain    Bipolar disorder (HCC)    Chronic headaches    COPD (chronic obstructive pulmonary disease) (HCC)    Degenerative disorder of bone    Depression    Esophageal ulcer    history   GERD (gastroesophageal reflux disease)    Hyperlipidemia    Peripheral vascular disease (HCC)    Pulmonary embolism (HCC)    Scoliosis    BP (!) 147/85   Pulse 99   Temp 98.2 F (36.8 C)   Ht '4\' 9"'$  (1.448 m)   Wt 151 lb 9.6 oz (68.8 kg)   SpO2 (!) 86% Comment: has copd and o2 at home though she does not wear except prn  BMI 32.81 kg/m   Opioid Risk Score:   Fall Risk Score:  `1  Depression screen  PHQ 2/9  Depression screen PHQ 2/9 06/09/2021  Decreased Interest 0  Down, Depressed, Hopeless 1  PHQ - 2 Score 1  Altered sleeping 2  Tired, decreased energy 2  Change in appetite 0  Feeling bad or failure about yourself  0  Trouble concentrating 2  Moving slowly or fidgety/restless 1  Suicidal thoughts 0  PHQ-9 Score 8    Review of Systems  Constitutional: Negative.   HENT: Negative.  Eyes: Negative.   Respiratory:         COPD  Cardiovascular: Negative.   Gastrointestinal:  Positive for abdominal pain and diarrhea.  Endocrine: Negative.   Genitourinary:  Positive for difficulty urinating.  Musculoskeletal:  Positive for arthralgias and back pain.  Skin:  Positive for rash.  Allergic/Immunologic: Negative.   Neurological:  Positive for tremors.  Hematological:  Bruises/bleeds easily.       Apixiban  Psychiatric/Behavioral:  Positive for confusion and dysphoric mood. The patient is nervous/anxious.       Objective:   Physical Exam Vitals and nursing note reviewed.  Constitutional:      Appearance: She is ill-appearing.  HENT:     Head: Normocephalic and atraumatic.     Mouth/Throat:     Mouth: Mucous membranes are dry.  Eyes:     Extraocular Movements: Extraocular movements intact.     Conjunctiva/sclera: Conjunctivae normal.     Pupils: Pupils are equal, round, and reactive to light.  Cardiovascular:     Rate and Rhythm: Normal rate and regular rhythm.     Heart sounds: Normal heart sounds.  Pulmonary:     Effort: Pulmonary effort is normal.     Breath sounds: Normal breath sounds.  Abdominal:     General: Abdomen is flat. Bowel sounds are normal.     Palpations: Abdomen is soft.  Musculoskeletal:     Cervical back: Normal range of motion.     Comments: Marked dextroscoliosis in the thoracic area.  No scoliosis in the lumbar area  Negative straight leg raising bilaterally Hip and knee range of motion within functional limits Upper extremity range of  motion is within functional limits  Skin:    General: Skin is warm and dry.  Neurological:     Mental Status: She is alert and oriented to person, place, and time.     Cranial Nerves: No dysarthria or facial asymmetry.     Sensory: Sensory deficit present.     Motor: No tremor, atrophy or abnormal muscle tone.     Coordination: Coordination is intact.     Gait: Gait abnormal.     Deep Tendon Reflexes:     Reflex Scores:      Patellar reflexes are 1+ on the right side and 1+ on the left side.      Achilles reflexes are 1+ on the right side and 1+ on the left side.    Comments: Motor strength is 5/5 bilateral deltoid, bicep, tricep, grip, hip flexor, knee extensor, ankle dorsiflexion plantar flexor Sensation is reduced bilateral L5 and S1 dermatomal distribution.  Psychiatric:        Mood and Affect: Mood normal.        Behavior: Behavior normal.          Assessment & Plan:   1.  Chronic low back pain this is likely multifactorial.  The patient has extension mediated pain which is worse with walking and standing which is consistent with lumbar spondylosis.  She has no evidence of sciatic symptoms. The patient had lumbar medial branch blocks 5 years ago which were helpful for her.  She never underwent radiofrequency neurotomy.  She only had 1 set of medial branch blocks according to records reviewed.  We will need to repeat 2 sets.  She would like to proceed with this. We will see the patient in 1 month for bilateral L3-L4 medial branch and L5 dorsal ramus injections under fluoroscopic guidance. We discussed lumbar x-rays given her  posture abnormalities.  2.  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  Went over her spine pathology using spine  model.  Discussed with patient as well as her son who is in the room with her.  Will check thoracic x-rays as well.

## 2021-06-14 LAB — TOXASSURE SELECT,+ANTIDEPR,UR

## 2021-06-16 ENCOUNTER — Telehealth: Payer: Self-pay | Admitting: *Deleted

## 2021-06-16 NOTE — Telephone Encounter (Signed)
Urine drug screen for this encounter is consistent for prescribed medication 

## 2021-08-10 ENCOUNTER — Encounter: Payer: Medicare Other | Attending: Physical Medicine & Rehabilitation | Admitting: Physical Medicine & Rehabilitation

## 2021-08-10 ENCOUNTER — Other Ambulatory Visit: Payer: Self-pay

## 2021-08-10 ENCOUNTER — Encounter: Payer: Self-pay | Admitting: Physical Medicine & Rehabilitation

## 2021-08-10 VITALS — BP 143/87 | HR 95 | Temp 98.2°F | Ht <= 58 in | Wt 149.0 lb

## 2021-08-10 DIAGNOSIS — M47817 Spondylosis without myelopathy or radiculopathy, lumbosacral region: Secondary | ICD-10-CM | POA: Insufficient documentation

## 2021-08-10 NOTE — Progress Notes (Signed)
Bilateral Lumbar L3, L4  medial branch blocks and L 5 dorsal ramus injection under fluoroscopic guidance  Indication: Lumbar pain which is not relieved by medication management or other conservative care and interfering with self-care and mobility.  Informed consent was obtained after describing risks and benefits of the procedure with the patient, this includes bleeding, infection, paralysis and medication side effects.  The patient wishes to proceed and has given written consent.  The patient was placed in prone position.  The lumbar area was marked and prepped with Betadine.  One mL of 1% lidocaine was injected into each of 6 areas into the skin and subcutaneous tissue.  Then a 22-gauge 3.5in spinal needle was inserted targeting the junction of the left S1 superior articular process and sacral ala junction. Needle was advanced under fluoroscopic guidance.  Bone contact was made.  Isovue 200 was injected x 0.5 mL demonstrating no intravascular uptake.  Then a solution  of 2% MPF lidocaine was injected x 0.5 mL.  Then the left L5 superior articular process in transverse process junction was targeted.  Bone contact was made.  Isovue 200 was injected x 0.5 mL demonstrating no intravascular uptake. Then a solution containing  2% MPF lidocaine was injected x 0.5 mL.  Then the left L4 superior articular process in transverse process junction was targeted.  Bone contact was made.  Isovue 200 was injected x 0.5 mL demonstrating no intravascular uptake.  Then a solution containing2% MPF lidocaine was injected x 0.5 mL.  This same procedure was performed on the right side using the same needle, technique and injectate.  Patient tolerated procedure well.  Post procedure instructions were given. Preinjection 7/10 postinjection 5/10 we will follow-up in 1 month to further assess

## 2021-08-10 NOTE — Progress Notes (Signed)
   Subjective:    Patient ID: [name] May, female    DOB: 10-Jun-1941, 80 y.o.   MRN: [id number]  HPI   PROCEDURE RECORD [facility]   Name: [name] May DOB:[birth year] MRN: [id number]  Date:08/10/2021  Physician: Alysia Penna, MD    Nurse/CMA: Jorja Loa MA  Allergies:  Allergies  Allergen Reactions   Latex Itching, Rash and Other (See Comments)    Blisters for months on legs   Mold Extract [Trichophyton] Anaphylaxis, Swelling and Rash   Molds & Smuts Anaphylaxis, Swelling and Rash   Vancomycin Other (See Comments)    05/01/17: Pt noted to c/o redness and burning to R arm during IV Vancomycin 1g infusion. Infusion stopped and diphenhydramine 25mg  IV given.    Cephalexin Rash    Consent Signed: Yes.    Is patient diabetic? No.  CBG today? NA  Pregnant: No. LMP: No LMP recorded. Patient has had a hysterectomy. (age 68-55)  Anticoagulants:  Eliquis Anti-inflammatory: no Antibiotics: no  Procedure: Bilateral Medial Branch Block Position: Prone Start Time: 1:23 pm  End Time:  1:37 Fluoro Time: 58  RN/CMA Kennette Cuthrell MA Guerline Happ MA    Time 1:10 pm 1:44 pm    BP 143/87 156/89    Pulse 95 90    Respirations 16 16    O2 Sat 96 95    S/S 6 6    Pain Level 7/10 5/10     D/C home with Daughter, patient A & O X 3, D/C instructions reviewed, and sits independently.        Review of Systems     Objective:   Physical Exam        Assessment & Plan:

## 2021-08-10 NOTE — Patient Instructions (Signed)

## 2021-08-29 ENCOUNTER — Telehealth: Payer: Self-pay | Admitting: *Deleted

## 2021-08-29 DIAGNOSIS — M47817 Spondylosis without myelopathy or radiculopathy, lumbosacral region: Secondary | ICD-10-CM

## 2021-08-29 DIAGNOSIS — G894 Chronic pain syndrome: Secondary | ICD-10-CM

## 2021-08-29 DIAGNOSIS — M546 Pain in thoracic spine: Secondary | ICD-10-CM

## 2021-08-29 NOTE — Telephone Encounter (Signed)
Mrs Strickland called and reports that after her injection the site is doing well. There has developed pain above the areas injected now like a band across her back that the pain is so bad it takes her breath away. It is about the width of her hand all the way across above where the injections occurred.  Her appt is not until Nov 22 and she cannot wait that long. We have scheduled her for an opening spot on Thursday morning to be evaluated.

## 2021-08-31 ENCOUNTER — Encounter: Payer: Medicare Other | Attending: Physical Medicine & Rehabilitation | Admitting: Physical Medicine & Rehabilitation

## 2021-08-31 ENCOUNTER — Other Ambulatory Visit: Payer: Self-pay

## 2021-08-31 ENCOUNTER — Ambulatory Visit
Admission: RE | Admit: 2021-08-31 | Discharge: 2021-08-31 | Disposition: A | Discharge status: Home / Self Care | Payer: Medicare Other | Source: Ambulatory Visit | Attending: Physical Medicine & Rehabilitation | Admitting: Physical Medicine & Rehabilitation

## 2021-08-31 ENCOUNTER — Encounter: Payer: Self-pay | Admitting: Physical Medicine & Rehabilitation

## 2021-08-31 ENCOUNTER — Other Ambulatory Visit: Payer: Self-pay | Admitting: Physical Medicine & Rehabilitation

## 2021-08-31 VITALS — BP 142/94 | HR 74 | Ht <= 58 in

## 2021-08-31 DIAGNOSIS — M8008XA Age-related osteoporosis with current pathological fracture, vertebra(e), initial encounter for fracture: Secondary | ICD-10-CM | POA: Insufficient documentation

## 2021-08-31 DIAGNOSIS — M47817 Spondylosis without myelopathy or radiculopathy, lumbosacral region: Secondary | ICD-10-CM

## 2021-08-31 DIAGNOSIS — M546 Pain in thoracic spine: Secondary | ICD-10-CM

## 2021-08-31 MED ORDER — HYDROCODONE-ACETAMINOPHEN 10-325 MG PO TABS: 0.5000 | ORAL_TABLET | ORAL | 0 refills | Status: DC | PRN

## 2021-08-31 NOTE — Progress Notes (Signed)
Subjective:    Patient ID: [name] May, female    DOB: 05-23-1941, 80 y.o.   MRN: [id number]  HPI  CC:  Mid back pain  80 yo female with hx of severe thoraco lumbar scoiliosis who developed severe mid back pain ~1 week ago.  Had B L3-4-5 MBB on 08/10/2021 No falls but pt states pain was of sudden onset but was not associated with lifting, felt like she just turned wrong  Pain is intermittent and lasts for a few seconds and releases and worsens with bending forward.  No new weakness or numbness in legs but feels need to sit down after pain "grabs her" Reviewed Thoracic spine xrays- T11 endplate fx  Has seen Dr Prince Rome in past Spine center   Has taken Hydrocodone 10mg  tabs prior compression fractures with recent imaging showing chronic fractures at T7,T9 and T12 Pain Inventory Average Pain 10 Pain Right Now 10 My pain is sharp  In the last 24 hours, has pain interfered with the following? General activity 10 Relation with others 10 Enjoyment of life 10 What TIME of day is your pain at its worst? varies Sleep (in general) Fair  Pain is worse with: bending Pain improves with:  nothing Relief from Meds:  na  Family History  Problem Relation Age of Onset   Deep vein thrombosis Neg Hx    Social History   Socioeconomic History   Marital status: Widowed    Spouse name: Not on file   Number of children: Not on file   Years of education: Not on file   Highest education level: Not on file  Occupational History   Not on file  Tobacco Use   Smoking status: Former   Smokeless tobacco: Never   Tobacco comments:    quit 25 years ago   Vaping Use   Vaping Use: Never used  Substance and Sexual Activity   Alcohol use: No   Drug use: No   Sexual activity: Not Currently  Other Topics Concern   Not on file  Social History Narrative   Not on file   Social Determinants of Health   Financial Resource Strain: Not on file  Food Insecurity: Not on file  Transportation Needs:  Not on file  Physical Activity: Not on file  Stress: Not on file  Social Connections: Not on file   Past Surgical History:  Procedure Laterality Date   ABDOMINAL HYSTERECTOMY     [place]  05/16/2017   Procedure: STENT OF BILATERAL COMMON FEMORAL VEIN WITH 16 X 90 WALL STENT;  Surgeon: Waynetta Sandy, MD;  Location: [place];  Service: Vascular;;   hemroidectomy     INSERTION OF ILIAC STENT Bilateral 05/16/2017   Procedure: STENT OF BILATERAL COMMON AND EXTERNAL ILIAC VEIN WITH 18 X 90 WALL STENT;  Surgeon: Waynetta Sandy, MD;  Location: [place];  Service: Vascular;  Laterality: Bilateral;   INTRAVASCULAR ULTRASOUND/IVUS N/A 05/06/2017   Procedure: INTRAVASCULAR ULTRASOUND/IVUS;  Surgeon: Waynetta Sandy, MD;  Location: [place];  Service: Vascular;  Laterality: N/A;   IVC FILTER INSERTION N/A 05/16/2017   Procedure: MECHANICAL THROMBECTOMY OF IVC FILTER;  Surgeon: Waynetta Sandy, MD;  Location: [place];  Service: Vascular;  Laterality: N/A;   LAMINECTOMY     LOWER EXTREMITY VENOGRAPHY N/A 05/14/2017   Procedure: Lower Extremity Venography;  Surgeon: Serafina Mitchell,  MD;  Location: [place] [facility];  Service: Cardiovascular;  Laterality: N/A;   NASAL SINUS SURGERY     PERCUTANEOUS VENOUS THROMBECTOMY,LYSIS WITH INTRAVASCULAR ULTRASOUND (IVUS) N/A 05/16/2017   Procedure: PERCUTANEOUS VENOUS THROMBECTOMY AND LYSIS WITH INTRAVASCULAR ULTRASOUND (IVUS);  Surgeon: Waynetta Sandy, MD;  Location: [place];  Service: Vascular;  Laterality: N/A;   VENA CAVA FILTER PLACEMENT N/A 05/16/2017   Procedure: RETRIEVAL INFERIOR VENA-CAVA FILTER;  Surgeon: Waynetta Sandy, MD;  Location: [place];  Service: Vascular;  Laterality: N/A;   VENOGRAM Bilateral 05/16/2017   Procedure: VENOGRAM OF IVC;  Surgeon: Waynetta Sandy, MD;  Location: [facility];  Service: Vascular;  Laterality:  Bilateral;   Past Surgical History:  Procedure Laterality Date   ABDOMINAL HYSTERECTOMY     ANKLE SURGERY     COLONOSCOPY     EYE SURGERY     caratacs   FEMORAL ARTERY EXPLORATION  05/16/2017   Procedure: STENT OF BILATERAL COMMON FEMORAL VEIN WITH 16 X 90 WALL STENT;  Surgeon: Waynetta Sandy, MD;  Location: [place];  Service: Vascular;;   hemroidectomy     INSERTION OF ILIAC STENT Bilateral 05/16/2017   Procedure: STENT OF BILATERAL COMMON AND EXTERNAL ILIAC VEIN WITH 18 X 90 WALL STENT;  Surgeon: Waynetta Sandy, MD;  Location: [place];  Service: Vascular;  Laterality: Bilateral;   INTRAVASCULAR ULTRASOUND/IVUS N/A 05/06/2017   Procedure: INTRAVASCULAR ULTRASOUND/IVUS;  Surgeon: Waynetta Sandy, MD;  Location: [place];  Service: Vascular;  Laterality: N/A;   IVC FILTER INSERTION N/A 05/16/2017   Procedure: MECHANICAL THROMBECTOMY OF IVC FILTER;  Surgeon: Waynetta Sandy, MD;  Location: [place];  Service: Vascular;  Laterality: N/A;   LAMINECTOMY     LOWER EXTREMITY VENOGRAPHY N/A 05/14/2017   Procedure: Lower Extremity Venography;  Surgeon: Serafina Mitchell, MD;  Location: [place] [facility];  Service: Cardiovascular;  Laterality: N/A;   NASAL SINUS SURGERY     PERCUTANEOUS VENOUS THROMBECTOMY,LYSIS WITH INTRAVASCULAR ULTRASOUND (IVUS) N/A 05/16/2017   Procedure: PERCUTANEOUS VENOUS THROMBECTOMY AND LYSIS WITH INTRAVASCULAR ULTRASOUND (IVUS);  Surgeon: Waynetta Sandy, MD;  Location: [place];  Service: Vascular;  Laterality: N/A;   VENA CAVA FILTER PLACEMENT N/A 05/16/2017   Procedure: RETRIEVAL INFERIOR VENA-CAVA FILTER;  Surgeon: Waynetta Sandy, MD;  Location: [place];  Service: Vascular;  Laterality: N/A;   VENOGRAM Bilateral 05/16/2017   Procedure: VENOGRAM OF IVC;  Surgeon: Waynetta Sandy, MD;  Location: [place];  Service: Vascular;  Laterality: Bilateral;   Past Medical History:  Diagnosis Date   Allergy    Anemia    Arthritis     left knee   Back pain    Bipolar disorder (HCC)    Chronic headaches    COPD (chronic obstructive pulmonary disease) (HCC)    Degenerative disorder of bone    Depression    Esophageal ulcer    history   GERD (gastroesophageal reflux disease)    Hyperlipidemia    Peripheral vascular disease (HCC)    Pulmonary embolism (HCC)    Scoliosis    BP (!) 142/94   Pulse 74   Ht 4\' 9"  (1.448 m)   SpO2 90%   BMI 32.24 kg/m   Opioid Risk Score:   Fall Risk Score:  `1  Depression screen PHQ 2/9  Depression screen [facility] 2/9 08/10/2021 06/09/2021  Decreased Interest 1 0  Down, Depressed, Hopeless 1 1  PHQ - 2 Score 2 1  Altered sleeping -  2  Tired, decreased energy - 2  Change in appetite - 0  Feeling bad or failure about yourself  - 0  Trouble concentrating - 2  Moving slowly or fidgety/restless - 1  Suicidal thoughts - 0  PHQ-9 Score - 8     Review of Systems  Musculoskeletal:  Positive for back pain and gait problem.  All other systems reviewed and are negative.     Objective:   Physical Exam Vitals and nursing note reviewed.  Neurological:     Mental Status: She is alert.   Dextroconvex kyphoscoliosis Thoraco lumbar spine Mild tenderness over the T8-L2 paraspinal and spinous process area No pain to palpation in lower lumbar area Neg SLR  Motor 5/5 in Bilateral HF, KE, ADF Sensation intact in BLE Amb with walker no evidence of toe drag or knee instability         Assessment & Plan:    Acute onset mod/sev thoracic pain with xray demonstrating a T11 sup endplate compression, no neuro deficits, prior hx of same at T7,9 and 12.  Was able to recover from prior with opioid analgesic and time.  Discussed using 1/2 tab of hydrocodone 10mg   as needed , discussed fall risk using opioids in [place], states tramadol did not help in pasat  Pt does not wish to consider kyphoplasty, no neuro defs to suggest need for axial imaging RTC 1 mo

## 2021-10-03 ENCOUNTER — Encounter: Payer: Medicare Other | Attending: Physical Medicine & Rehabilitation | Admitting: Physical Medicine & Rehabilitation

## 2021-10-03 ENCOUNTER — Other Ambulatory Visit: Payer: Self-pay

## 2021-10-03 ENCOUNTER — Encounter: Payer: Self-pay | Admitting: Physical Medicine & Rehabilitation

## 2021-10-03 VITALS — BP 99/68 | HR 97 | Temp 99.3°F | Ht <= 58 in | Wt 142.0 lb

## 2021-10-03 DIAGNOSIS — M8008XS Age-related osteoporosis with current pathological fracture, vertebra(e), sequela: Secondary | ICD-10-CM | POA: Insufficient documentation

## 2021-10-03 DIAGNOSIS — R0781 Pleurodynia: Secondary | ICD-10-CM | POA: Diagnosis present

## 2021-10-03 MED ORDER — HYDROCODONE-ACETAMINOPHEN 10-325 MG PO TABS: 0.5000 | ORAL_TABLET | ORAL | 0 refills | Status: DC | PRN

## 2021-10-03 NOTE — Progress Notes (Signed)
Subjective:    Patient ID: [name] May, female    DOB: April 22, 1941, 80 y.o.   MRN: [id number]  HPI  Wants to go back to Dr Jeralene Huff, thinking about  Mid back pain has improved but had an episode of twisting with onset of left sided lower rib pain  Reviewed 08/31/2021 thoracic spine xrays- T11 endplate fx   Has seen Dr Prince Rome in past Spine center    Has taken Hydrocodone 10mg  tabs to reduce pain following prior compression fractures with recent imaging showing chronic fractures at T7,T9 and T12 Pain Inventory Average Pain 6 Pain Right Now 6 My pain is burning, sharp  In the last 24 hours, has pain interfered with the following? General activity 8 Relation with others 0 Enjoyment of life 0 What TIME of day is your pain at its worst? daytime and evening Sleep (in general) Poor  Pain is worse with: bending Pain improves with: rest and medication Relief from Meds: 7  Family History  Problem Relation Age of Onset   Deep vein thrombosis Neg Hx    Social History   Socioeconomic History   Marital status: Widowed    Spouse name: Not on file   Number of children: Not on file   Years of education: Not on file   Highest education level: Not on file  Occupational History   Not on file  Tobacco Use   Smoking status: Former   Smokeless tobacco: Never   Tobacco comments:    quit 25 years ago   Vaping Use   Vaping Use: Never used  Substance and Sexual Activity   Alcohol use: No   Drug use: No   Sexual activity: Not Currently  Other Topics Concern   Not on file  Social History Narrative   Not on file   Social Determinants of Health   Financial Resource Strain: Not on file  Food Insecurity: Not on file  Transportation Needs: Not on file  Physical Activity: Not on file  Stress: Not on file  Social Connections: Not on file   Past Surgical History:  Procedure Laterality Date   ABDOMINAL HYSTERECTOMY     [place]  05/16/2017   Procedure: STENT OF BILATERAL COMMON FEMORAL VEIN WITH 16 X 90 WALL STENT;  Surgeon: Waynetta Sandy, MD;  Location: [place];  Service: Vascular;;   hemroidectomy     INSERTION OF ILIAC STENT Bilateral 05/16/2017   Procedure: STENT OF BILATERAL COMMON AND EXTERNAL ILIAC VEIN WITH 18 X 90 WALL STENT;  Surgeon: Waynetta Sandy, MD;  Location: [place];  Service: Vascular;  Laterality: Bilateral;   INTRAVASCULAR ULTRASOUND/IVUS N/A 05/06/2017   Procedure: INTRAVASCULAR ULTRASOUND/IVUS;  Surgeon: Waynetta Sandy, MD;  Location: [place];  Service: Vascular;  Laterality: N/A;   IVC FILTER INSERTION N/A 05/16/2017   Procedure: MECHANICAL THROMBECTOMY OF IVC FILTER;  Surgeon: Waynetta Sandy, MD;  Location: [place];  Service: Vascular;  Laterality: N/A;   LAMINECTOMY     LOWER EXTREMITY VENOGRAPHY N/A 05/14/2017   Procedure: Lower Extremity Venography;  Surgeon: Serafina Mitchell, MD;  Location: [place] [facility];  Service: Cardiovascular;  Laterality: N/A;   NASAL SINUS SURGERY     PERCUTANEOUS VENOUS THROMBECTOMY,LYSIS WITH INTRAVASCULAR ULTRASOUND (IVUS) N/A 05/16/2017   Procedure: PERCUTANEOUS VENOUS THROMBECTOMY AND LYSIS WITH INTRAVASCULAR ULTRASOUND (IVUS);  Surgeon: Waynetta Sandy, MD;  Location: [place] [facility];  Service: Vascular;  Laterality: N/A;   VENA CAVA FILTER PLACEMENT N/A 05/16/2017   Procedure: RETRIEVAL INFERIOR VENA-CAVA FILTER;  Surgeon: Waynetta Sandy, MD;  Location: [place];  Service: Vascular;  Laterality: N/A;   VENOGRAM Bilateral 05/16/2017   Procedure: VENOGRAM OF IVC;  Surgeon: Waynetta Sandy, MD;  Location[facility];  Service: Vascular;  Laterality: Bilateral;   Past Surgical History:  Procedure Laterality Date   ABDOMINAL HYSTERECTOMY     ANKLE SURGERY     COLONOSCOPY     EYE SURGERY     caratacs   FEMORAL ARTERY EXPLORATION  05/16/2017   Procedure: STENT OF BILATERAL COMMON FEMORAL VEIN WITH  16 X 90 WALL STENT;  Surgeon: Waynetta Sandy, MD;  Location: [place];  Service: Vascular;;   hemroidectomy     INSERTION OF ILIAC STENT Bilateral 05/16/2017   Procedure: STENT OF BILATERAL COMMON AND EXTERNAL ILIAC VEIN WITH 18 X 90 WALL STENT;  Surgeon: Waynetta Sandy, MD;  Location: [place];  Service: Vascular;  Laterality: Bilateral;   INTRAVASCULAR ULTRASOUND/IVUS N/A 05/06/2017   Procedure: INTRAVASCULAR ULTRASOUND/IVUS;  Surgeon: Waynetta Sandy, MD;  Location: [place];  Service: Vascular;  Laterality: N/A;   IVC FILTER INSERTION N/A 05/16/2017   Procedure: MECHANICAL THROMBECTOMY OF IVC FILTER;  Surgeon: Waynetta Sandy, MD;  Location: [place];  Service: Vascular;  Laterality: N/A;   LAMINECTOMY     LOWER EXTREMITY VENOGRAPHY N/A 05/14/2017   Procedure: Lower Extremity Venography;  Surgeon: Serafina Mitchell, MD;  Location: [place] [facility];  Service: Cardiovascular;  Laterality: N/A;   NASAL SINUS SURGERY     PERCUTANEOUS VENOUS THROMBECTOMY,LYSIS WITH INTRAVASCULAR ULTRASOUND (IVUS) N/A 05/16/2017   Procedure: PERCUTANEOUS VENOUS THROMBECTOMY AND LYSIS WITH INTRAVASCULAR ULTRASOUND (IVUS);  Surgeon: Waynetta Sandy, MD;  Location: [place];  Service: Vascular;  Laterality: N/A;   VENA CAVA FILTER PLACEMENT N/A 05/16/2017   Procedure: RETRIEVAL INFERIOR VENA-CAVA FILTER;  Surgeon: Waynetta Sandy, MD;  Location: [place];  Service: Vascular;  Laterality: N/A;   VENOGRAM Bilateral 05/16/2017   Procedure: VENOGRAM OF IVC;  Surgeon: Waynetta Sandy, MD;  Location: [facility] OR;  Service: Vascular;  Laterality: Bilateral;   Past Medical History:  Diagnosis Date   Allergy    Anemia    Arthritis    left knee   Back pain    Bipolar disorder (HCC)    Chronic headaches    COPD (chronic obstructive pulmonary disease) (HCC)    Degenerative disorder of bone    Depression    Esophageal ulcer    history   GERD (gastroesophageal reflux disease)     Hyperlipidemia    Peripheral vascular disease (HCC)    Pulmonary embolism (HCC)    Scoliosis    Ht 4\' 9"  (1.448 m)   Wt 142 lb (64.4 kg)   BMI 30.73 kg/m   Opioid Risk Score:   Fall Risk Score:  `1  Depression screen PHQ 2/9  Depression screen [facility] 2/9 10/03/2021 08/31/2021 08/10/2021 06/09/2021  Decreased Interest 0 1 1 0  Down, Depressed, Hopeless 0 1 1 1   PHQ - 2 Score 0 2 2 1   Altered sleeping - - - 2  Tired, decreased energy - - - 2  Change in appetite - - - 0  Feeling bad or failure about yourself  - - - 0  Trouble concentrating - - - 2  Moving slowly or fidgety/restless - - - 1  Suicidal thoughts - - - 0  PHQ-9 Score - - - 8      Review of Systems  Constitutional: Negative.   HENT: Negative.    Eyes: Negative.   Respiratory: Negative.    Cardiovascular: Negative.   Gastrointestinal: Negative.   Endocrine: Negative.   Genitourinary: Negative.   Musculoskeletal:  Positive for back pain and gait problem.  Skin: Negative.   Allergic/Immunologic: Negative.   Hematological: Negative.   Psychiatric/Behavioral: Negative.        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  She has tenderness palpation at the lower most palpable rib on the left side presumably T12 approximately 10 to 12 cm lateral to the spinous process. No crepitus noted Patient appears comfortable She ambulates with a cane no evidence of toe drag or knee instability Lower extremity strength is intact Mood and affect are appropriate she shows no sign of cognitive slowing.      Assessment & Plan:   Osteoporosis recommend treatment for this, had been on alendronate prescribed by Dr. Jeralene Huff  She is deciding whether or not to go back to Dr. Jeralene Huff or continue with her current MD. I offered a referral to endocrinology for further assessment and treatment The patient is experienced a gradual decline in function.  We will make PT referral they can also advise on  mobility devices such as Rollator. CHeck Xray of rib left side suspect lower thoracic rib fracture  No falls, will continue Norco 0.5 to 1 tablet/day follow-up in 6 weeks

## 2021-10-03 NOTE — Patient Instructions (Addendum)
Recommend treatment of osteoporosis, I can make referral to endocrinology or you can discuss with your primary MD  Rib xrays ordered at [facility], may walki n at any time  PT ordered at [facility] [facility] farm they can measure you for mobility devices

## 2021-10-10 ENCOUNTER — Other Ambulatory Visit: Payer: Self-pay

## 2021-10-10 ENCOUNTER — Encounter: Payer: Self-pay | Admitting: Physical Therapy

## 2021-10-10 ENCOUNTER — Ambulatory Visit: Payer: Medicare Other | Attending: Physical Medicine & Rehabilitation | Admitting: Physical Therapy

## 2021-10-10 DIAGNOSIS — M8008XS Age-related osteoporosis with current pathological fracture, vertebra(e), sequela: Secondary | ICD-10-CM | POA: Insufficient documentation

## 2021-10-10 DIAGNOSIS — G8929 Other chronic pain: Secondary | ICD-10-CM | POA: Diagnosis present

## 2021-10-10 DIAGNOSIS — R293 Abnormal posture: Secondary | ICD-10-CM | POA: Insufficient documentation

## 2021-10-10 DIAGNOSIS — R2681 Unsteadiness on feet: Secondary | ICD-10-CM | POA: Insufficient documentation

## 2021-10-10 DIAGNOSIS — M545 Low back pain, unspecified: Secondary | ICD-10-CM | POA: Insufficient documentation

## 2021-10-10 DIAGNOSIS — R0781 Pleurodynia: Secondary | ICD-10-CM | POA: Insufficient documentation

## 2021-10-10 DIAGNOSIS — M6281 Muscle weakness (generalized): Secondary | ICD-10-CM | POA: Diagnosis present

## 2021-10-10 NOTE — Therapy (Signed)
[facility]. [place], [place], [address] Phone: [phone]   Fax:  [phone]  Physical Therapy Evaluation  Patient Details  Name: [name] May MRN: [id number] Date of Birth: 1941-09-08 Referring Provider (PT): Alysia Penna   Encounter Date: 10/10/2021   PT End of Session - 10/10/21 1739     Visit Number 1    Number of Visits 17    Date for PT Re-Evaluation 12/05/21    Authorization Type UHC MCR    Authorization Time Period 10/10/21 to 12/05/21    Progress Note Due on Visit 10    PT Start Time 1619    PT Stop Time 1657    PT Time Calculation (min) 38 min    Activity Tolerance Patient tolerated treatment well    Behavior During Therapy WFL for tasks assessed/performed             Past Medical History:  Diagnosis Date   Allergy    Anemia    Arthritis    left knee   Back pain    Bipolar disorder (HCC)    Chronic headaches    COPD (chronic obstructive pulmonary disease) ([place])    Degenerative disorder of bone    Depression    Esophageal ulcer    history   GERD (gastroesophageal reflux disease)    Hyperlipidemia    Peripheral vascular disease ([place])    Pulmonary embolism ([place])    Scoliosis     Past Surgical History:  Procedure Laterality Date   ABDOMINAL HYSTERECTOMY     ANKLE SURGERY     COLONOSCOPY     EYE SURGERY     caratacs   FEMORAL ARTERY EXPLORATION  05/16/2017   Procedure: STENT OF BILATERAL COMMON FEMORAL VEIN WITH 16 X 90 WALL STENT;  Surgeon: Waynetta Sandy, MD;  Location: [facility];  Service: Vascular;;   hemroidectomy     INSERTION OF ILIAC STENT Bilateral 05/16/2017   Procedure: STENT OF BILATERAL COMMON AND EXTERNAL ILIAC VEIN WITH 18 X 90 WALL STENT;  Surgeon: Waynetta Sandy, MD;  Location: [facility];  Service: Vascular;  Laterality: Bilateral;   INTRAVASCULAR ULTRASOUND/IVUS N/A 05/06/2017   Procedure: INTRAVASCULAR ULTRASOUND/IVUS;  Surgeon: Waynetta Sandy, MD;  Location: [facility];  Service: Vascular;  Laterality: N/A;   IVC FILTER INSERTION N/A 05/16/2017   Procedure: MECHANICAL THROMBECTOMY OF IVC FILTER;  Surgeon: Waynetta Sandy, MD;  Location: [facility];  Service: Vascular;  Laterality: N/A;   LAMINECTOMY     LOWER EXTREMITY VENOGRAPHY N/A 05/14/2017   Procedure: Lower Extremity Venography;  Surgeon: Serafina Mitchell, MD;  Location: [facility];  Service: Cardiovascular;  Laterality: N/A;   NASAL SINUS SURGERY     PERCUTANEOUS VENOUS THROMBECTOMY,LYSIS WITH INTRAVASCULAR ULTRASOUND (IVUS) N/A 05/16/2017   Procedure: PERCUTANEOUS VENOUS THROMBECTOMY AND LYSIS WITH INTRAVASCULAR ULTRASOUND (IVUS);  Surgeon: Waynetta Sandy, MD;  Location: [facility];  Service: Vascular;  Laterality: N/A;   VENA CAVA FILTER PLACEMENT N/A 05/16/2017   Procedure: RETRIEVAL INFERIOR VENA-CAVA FILTER;  Surgeon: Waynetta Sandy, MD;  Location: [facility];  Service: Vascular;  Laterality: N/A;   VENOGRAM Bilateral 05/16/2017   Procedure: VENOGRAM OF IVC;  Surgeon: Waynetta Sandy, MD;  Location: [facility];  Service: Vascular;  Laterality: Bilateral;    There were no vitals filed for this visit.    Subjective Assessment - 10/10/21 1622     Subjective I have thoracic compression fractures and  also some rib fractures;I did something on my right side above my waist, I'm not sure what it was but I have to go have rib imaging again. I've never had a fall. Its hard for me to clean, I do take care of my own groceries and take care of my pet. Getting up is painful but its ok once I'm up. I try to stand up straight but its hard.    Patient Stated Goals strengthening what we can    Currently in Pain? Yes    Pain Score 5     Pain Location Back    Pain Orientation Right;Lower;Left    Pain Descriptors / Indicators Sharp;Burning    Pain Type Chronic pain    Pain Radiating Towards none    Pain Onset More than a month ago    Pain Frequency Constant     Aggravating Factors  doing chores, doing too much    Pain Relieving Factors medicine, being with friends/distraction    Effect of Pain on Daily Activities moderate                [facility] PT Assessment - 10/10/21 0001       Assessment   Medical Diagnosis lumbosacral spondylosis    Referring Provider (PT) Alysia Penna    Onset Date/Surgical Date --   chronic   Next MD Visit Dr. Letta Pate in January    Prior Therapy PT a long time ago for back pain      Precautions   Precautions Back   for comfort     Restrictions   Weight Bearing Restrictions No      Balance Screen   Has the patient fallen in the past 6 months No    Has the patient had a decrease in activity level because of a fear of falling?  No    Is the patient reluctant to leave their home because of a fear of falling?  No      Home Ecologist residence      Prior Function   Level of Independence Independent;Independent with basic ADLs   does need elp with cleaning   Leisure walking dog, going to church, going out to lunch with friends      Posture/Postural Control   Posture/Postural Control Postural limitations    Postural Limitations Rounded Shoulders;Forward head;Increased thoracic kyphosis;Decreased lumbar lordosis    Posture Comments "dowagers hump" as well as ongoing thoracic scoliosis      ROM / Strength   AROM / PROM / Strength Strength      Strength   Strength Assessment Site Hip;Knee    Right/Left Hip Right;Left    Right Hip Flexion 2+/5    Right Hip Extension 2/5   based off functional bridge   Right Hip ABduction 4/5    Left Hip Flexion 2+/5    Left Hip Extension 2/5   based off funcitonal bridge   Left Hip ABduction 3+/5    Right/Left Knee Right;Left    Right Knee Flexion 4+/5    Right Knee Extension 4+/5    Left Knee Flexion 4+/5    Left Knee Extension 4+/5      Ambulation/Gait   Gait Comments limited step lengths, tends to drift left and right,  generally unsteady with U UE support      Standardized Balance Assessment   Standardized Balance Assessment Berg Balance Test      Berg Balance Test   Sit to Stand Able to  stand  independently using hands    Standing Unsupported Able to stand safely 2 minutes    Sitting with Back Unsupported but Feet Supported on Floor or Stool Able to sit safely and securely 2 minutes    Stand to Sit Controls descent by using hands    Transfers Able to transfer safely, definite need of hands    Standing Unsupported with Eyes Closed Able to stand 10 seconds with supervision    Standing Unsupported with Feet Together Able to place feet together independently and stand for 1 minute with supervision    From Standing, Reach Forward with Outstretched Arm Can reach forward >12 cm safely (5")    From Standing Position, Pick up Object from Floor Able to pick up shoe, needs supervision    From Standing Position, Turn to Look Behind Over each Shoulder Turn sideways only but maintains balance    Turn 360 Degrees Able to turn 360 degrees safely but slowly    Standing Unsupported, Alternately Place Feet on Step/Stool Able to stand independently and complete 8 steps >20 seconds    Standing Unsupported, One Foot in Front Able to plae foot ahead of the other independently and hold 30 seconds    Standing on One Leg Able to lift leg independently and hold equal to or more than 3 seconds    Total Score 41    Berg comment: significant fall risk                        Objective measurements completed on examination: See above findings.                PT Education - 10/10/21 1738     Education Details exam findings, POC moving forward, role of PT in addressing back pain, muscle weakness, fall risk; benefit and role of rollator in reducing fall risk; how carrying heavy pocketbook on one shoulder increases fall risk by imbalancing her dynamically and also likely increases back pain    Person(s)  Educated Patient    Methods Explanation    Comprehension Verbalized understanding;Need further instruction              PT Short Term Goals - 10/10/21 1746       PT SHORT TERM GOAL #1   Title Will be compliant with appropriate HEP to be updated PRN    Time 4    Period Weeks    Status New    Target Date 11/07/21      PT SHORT TERM GOAL #2   Title Will consistently and safely use appropriate LRAD at home and in community in order to reduce fall risk/improve community access    Baseline would benefit from rollator at eval    Time 4    Period Weeks    Status New      PT SHORT TERM GOAL #3   Title Will report pain as being no more than 2/10 at worst in order to improve QOL and functional task tolerance    Time 4    Period Weeks    Status New      PT SHORT TERM GOAL #4   Title Will be able to complete bed mobility and functional lifting with good mechanics in order to protect back and reduce further risk of additional compression fractures    Time 4    Period Weeks    Status New  PT Long Term Goals - 10/10/21 1754       PT LONG TERM GOAL #1   Title MMT will improve by at least one grade in core and all other weak groups in order to reduce pain/improve function    Time 8    Period Weeks    Status New    Target Date 12/05/21      PT LONG TERM GOAL #2   Title Will score at least 48 on the Berg in order to show reduced fall risk    Time 8    Period Weeks    Status New      PT LONG TERM GOAL #3   Title Will be able to ambulate community distances with [facility] and no unsteadiness in order to improve community access with LRAD    Time 8    Period Weeks    Status New      PT LONG TERM GOAL #4   Title Will be compliant with appropriate gym based program such as silver sneakers in order to maintain functional gains and reduce risk of backslide of physical gains from PT    Time 8    Period [place] -  10/10/21 1743     Clinical Impression Statement [name] May arrives today, very pleasant and talkative; tells me she has not had any falls, but has ongoing pain from thoracic compression fractures and that she also did something to the right lower side of her back and that has been painful ever since. She has never tried a brace and has never received kyphoplasty for her compression fractures. Very talkative and needed frequent redirection today, but very pleasant. Exam reveals severe postural limitations (many chronic from dowager's hump and scoliosis), generalized BLE muscle and core weakness, and significant impairment in functional balance skills as evidenced by Merrilee Jansky 41/56. Would benefit from rollator to assist in improving her balance- and also to keep her from unbalancing herself/increasing back pain by wearing heavy purse on one shoulder. Will benefit from skilled PT services to address functional limitations, reduce pain, and minimize fall risk.    Personal Factors and Comorbidities Age;Behavior Pattern;Social Background;Fitness;Time since onset of injury/illness/exacerbation;Past/Current Experience    Examination-Activity Limitations Squat;Bed Mobility;Lift;Stairs;Bend;Locomotion Level;Stand;Reach Overhead;Carry;Transfers;Sit;Sleep    Examination-Participation Restrictions Church;Cleaning;Shop;Community Activity;Volunteer;Meal Prep;Yard Work    Merchant navy officer Evolving/Moderate complexity    Clinical Decision Making Moderate    Rehab Potential Good    PT Frequency 2x / week    PT Duration 8 weeks    PT Treatment/Interventions ADLs/Self Care Home Management;Cryotherapy;Electrical Stimulation;Iontophoresis 4mg /ml Dexamethasone;Moist Heat;Ultrasound;DME Instruction;Gait training;Stair training;Functional mobility training;Therapeutic activities;Therapeutic exercise;Balance training;Neuromuscular re-education;Patient/family education;Manual techniques;Passive range of motion;Dry  needling;Energy conservation    PT Next Visit Plan assign HEP and do FOTO (did not have time at eval); back precautions for comfort given multiple compression fxs; general strengthening, endurance, balance training program    Consulted and Agree with Plan of Care Patient             Patient will benefit from skilled therapeutic intervention in order to improve the following deficits and impairments:  Abnormal gait, Decreased coordination, Difficulty walking, Cardiopulmonary status limiting activity, Decreased endurance, Decreased safety awareness, Decreased activity tolerance, Pain, Decreased balance, Impaired flexibility, Improper body mechanics, Decreased mobility, Decreased strength, Postural dysfunction  Visit Diagnosis: Muscle weakness (generalized)  Unsteadiness on feet  Chronic bilateral  low back pain without sciatica  Abnormal posture     Problem List Patient Active Problem List   Diagnosis Date Noted   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/09/2021   Pneumonia due to COVID-19 virus 07/01/2020   Macrocytic anemia 07/01/2020   Thrombocytopenia ([place]) 07/01/2020   Acute hypoxemic respiratory failure due to COVID-19 ([facility] [place]) 07/01/2020   Cellulitis of left lower extremity 04/30/2017   Cellulitis 04/30/2017   Edema of left lower extremity 04/23/2017   AKI (acute kidney injury) ([place]) 04/23/2017   ARF (acute renal failure) ([place]) 04/22/2017   DVT of lower extremity, bilateral ([place]) 02/10/2015   PE (pulmonary embolism)    Shortness of breath    Normocytic anemia 02/07/2015   DVT (deep venous thrombosis) (HCC)    Acute pulmonary embolism ([place]) 02/06/2015   Scoliosis of thoracic spine 02/06/2015   GERD (gastroesophageal reflux disease) 02/06/2015   Acute respiratory failure with hypoxia ([place]) 02/06/2015   Acute kidney injury ([place]) 02/06/2015   Elevated troponin 02/06/2015   Ann Lions PT, DPT, PN2   Supplemental Physical Therapist [place]    Pager  [phone] Acute Rehab Office [facility]. [place], [place], [address] Phone: [phone]   Fax:  [phone]  Name: [name] May MRN: [id number] Date of Birth: Jul 10, 1941

## 2021-10-17 ENCOUNTER — Ambulatory Visit: Payer: Medicare Other | Admitting: Physical Therapy

## 2021-10-20 ENCOUNTER — Encounter: Payer: Self-pay | Admitting: Physical Therapy

## 2021-10-20 ENCOUNTER — Other Ambulatory Visit: Payer: Self-pay

## 2021-10-20 ENCOUNTER — Ambulatory Visit: Payer: Medicare Other | Attending: Physical Medicine & Rehabilitation | Admitting: Physical Therapy

## 2021-10-20 DIAGNOSIS — M545 Low back pain, unspecified: Secondary | ICD-10-CM | POA: Insufficient documentation

## 2021-10-20 DIAGNOSIS — G8929 Other chronic pain: Secondary | ICD-10-CM

## 2021-10-20 DIAGNOSIS — M6281 Muscle weakness (generalized): Secondary | ICD-10-CM

## 2021-10-20 DIAGNOSIS — R293 Abnormal posture: Secondary | ICD-10-CM | POA: Diagnosis present

## 2021-10-20 DIAGNOSIS — R2681 Unsteadiness on feet: Secondary | ICD-10-CM

## 2021-10-20 NOTE — Patient Instructions (Signed)
Access Code: [email or web address] URL: [email or web address] Date: 10/20/2021 Prepared by: Ethel Rana  Exercises Seated Long Arc Quad - 1 x daily - 7 x weekly - 1 sets - 10 reps Seated Hip Abduction - 1 x daily - 7 x weekly - 1 sets - 10 reps Seated Heel Toe Raises - 1 x daily - 7 x weekly - 1 sets - 10 reps

## 2021-10-20 NOTE — Therapy (Signed)
[facility]. [place], [place], [address] Phone: [phone]   Fax:  [phone]  Physical Therapy Treatment  Patient Details  Name: [name] May MRN: [id number] Date of Birth: [birth year] Referring Provider (PT): Alysia Penna   Encounter Date: 10/20/2021   PT End of Session - 10/20/21 1011     Visit Number 2    Number of Visits 17    Date for PT Re-Evaluation 12/05/21    Authorization Type UHC MCR    Authorization Time Period 10/10/21 to 12/05/21    Progress Note Due on Visit 10    PT Start Time 0932    PT Stop Time 1014    PT Time Calculation (min) 42 min    Activity Tolerance Patient tolerated treatment well    Behavior During Therapy [facility] for tasks assessed/performed             Past Medical History:  Diagnosis Date   Allergy    Anemia    Arthritis    left knee   Back pain    Bipolar disorder (HCC)    Chronic headaches    COPD (chronic obstructive pulmonary disease) ([place])    Degenerative disorder of bone    Depression    Esophageal ulcer    history   GERD (gastroesophageal reflux disease)    Hyperlipidemia    Peripheral vascular disease ([place])    Pulmonary embolism ([place])    Scoliosis     Past Surgical History:  Procedure Laterality Date   ABDOMINAL HYSTERECTOMY     ANKLE SURGERY     COLONOSCOPY     EYE SURGERY     caratacs   FEMORAL ARTERY EXPLORATION  05/16/2017   Procedure: STENT OF BILATERAL COMMON FEMORAL VEIN WITH 16 X 90 WALL STENT;  Surgeon: Waynetta Sandy, MD;  Location: [facility];  Service: Vascular;;   hemroidectomy     INSERTION OF ILIAC STENT Bilateral 05/16/2017   Procedure: STENT OF BILATERAL COMMON AND EXTERNAL ILIAC VEIN WITH 18 X 90 WALL STENT;  Surgeon: Waynetta Sandy, MD;  Location: [facility];  Service: Vascular;  Laterality: Bilateral;   INTRAVASCULAR ULTRASOUND/IVUS N/A 05/06/2017   Procedure: INTRAVASCULAR ULTRASOUND/IVUS;  Surgeon: Waynetta Sandy, MD;  Location: [facility];  Service: Vascular;  Laterality: N/A;   IVC FILTER INSERTION N/A 05/16/2017   Procedure: MECHANICAL THROMBECTOMY OF IVC FILTER;  Surgeon: Waynetta Sandy, MD;  Location: [facility];  Service: Vascular;  Laterality: N/A;   LAMINECTOMY     LOWER EXTREMITY VENOGRAPHY N/A 05/14/2017   Procedure: Lower Extremity Venography;  Surgeon: Serafina Mitchell, MD;  Location: [facility];  Service: Cardiovascular;  Laterality: N/A;   NASAL SINUS SURGERY     PERCUTANEOUS VENOUS THROMBECTOMY,LYSIS WITH INTRAVASCULAR ULTRASOUND (IVUS) N/A 05/16/2017   Procedure: PERCUTANEOUS VENOUS THROMBECTOMY AND LYSIS WITH INTRAVASCULAR ULTRASOUND (IVUS);  Surgeon: Waynetta Sandy, MD;  Location: [facility];  Service: Vascular;  Laterality: N/A;   VENA CAVA FILTER PLACEMENT N/A 05/16/2017   Procedure: RETRIEVAL INFERIOR VENA-CAVA FILTER;  Surgeon: Waynetta Sandy, MD;  Location: [facility];  Service: Vascular;  Laterality: N/A;   VENOGRAM Bilateral 05/16/2017   Procedure: VENOGRAM OF IVC;  Surgeon: Waynetta Sandy, MD;  Location: [facility];  Service: Vascular;  Laterality: Bilateral;    There were no vitals filed for this visit.   Subjective Assessment - 10/20/21 0937     Subjective Patient reports increased pain in her rib  fractures after she helped remove items from her friends apartment after she passed away.    Pertinent History PVD, scoliosi, GERD, osteoporosis, osteopenia    How long can you stand comfortably? Painful to stand at all.    Currently in Pain? Yes    Pain Score 5     Pain Location Back    Pain Orientation Left;Right;Lower    Pain Descriptors / Indicators Sharp    Pain Type Chronic pain    Pain Onset More than a month ago    Pain Frequency Constant    Aggravating Factors  moving items    Pain Relieving Factors medicine                               [facility] Adult PT Treatment/Exercise - 10/20/21 0001       Exercises    Exercises Lumbar      Lumbar Exercises: Aerobic   Nustep L3 x 6 minutes      Lumbar Exercises: Standing   Other Standing Lumbar Exercises hip flexion, ext, abd x 5 reps each leg.      Lumbar Exercises: Seated   Long Arc Quad on Chair Both;Strengthening;5 reps    Other Seated Lumbar Exercises hip abd, heel raise/toe raise, 10 reps each      Modalities   Modalities Moist Heat      Moist Heat Therapy   Number Minutes Moist Heat 15 Minutes    Moist Heat Location Lumbar Spine                     PT Education - 10/20/21 1011     Education Details HEP    Person(s) Educated Patient    Methods Explanation;Demonstration;Handout    Comprehension Verbalized understanding;Returned demonstration              PT Short Term Goals - 10/20/21 1018       PT SHORT TERM GOAL #1   Title Will be compliant with appropriate HEP to be updated PRN    Time 3    Period Weeks    Status On-going    Target Date 11/07/21      PT SHORT TERM GOAL #2   Title Will consistently and safely use appropriate LRAD at home and in community in order to reduce fall risk/improve community access    Baseline would benefit from rollator at eval    Time 4    Period Weeks    Status On-going      PT SHORT TERM GOAL #3   Title Will report pain as being no more than 2/10 at worst in order to improve QOL and functional task tolerance    Time 2    Period Weeks    Status On-going      PT SHORT TERM GOAL #4   Title Will be able to complete bed mobility and functional lifting with good mechanics in order to protect back and reduce further risk of additional compression fractures    Time 3    Period Weeks    Status On-going               PT Long Term Goals - 10/10/21 1754       PT LONG TERM GOAL #1   Title MMT will improve by at least one grade in core and all other weak groups in order to reduce pain/improve function    Time 8  Period Weeks    Status New    Target Date 12/05/21       PT LONG TERM GOAL #2   Title Will score at least 48 on the Berg in order to show reduced fall risk    Time 8    Period Weeks    Status New      PT LONG TERM GOAL #3   Title Will be able to ambulate community distances with [facility] and no unsteadiness in order to improve community access with LRAD    Time 8    Period Weeks    Status New      PT LONG TERM GOAL #4   Title Will be compliant with appropriate gym based program such as silver sneakers in order to maintain functional gains and reduce risk of backslide of physical gains from PT    Time 8    Period Weeks    Status New                   Plan - 10/20/21 1016     Clinical Impression Statement Patient reports increaed pain after moving her friends possessions after she had passed away. Provided MH to back as patient compelted FOTO. Initiated HEP, combined previous exercises in stand iwth new seated exercises to assess tolerance.    Personal Factors and Comorbidities Age;Behavior Pattern;Social Background;Fitness;Time since onset of injury/illness/exacerbation;Past/Current Experience    Examination-Activity Limitations Squat;Bed Mobility;Lift;Stairs;Bend;Locomotion Level;Stand;Reach Overhead;Carry;Transfers;Sit;Sleep    Examination-Participation Restrictions Church;Cleaning;Shop;Community Activity;Volunteer;Meal Prep;Yard Work    Merchant navy officer Evolving/Moderate complexity    Clinical Decision Making Moderate    Rehab Potential Good    PT Frequency 2x / week    PT Duration 8 weeks    PT Treatment/Interventions ADLs/Self Care Home Management;Cryotherapy;Electrical Stimulation;Iontophoresis 4mg /ml Dexamethasone;Moist Heat;Ultrasound;DME Instruction;Gait training;Stair training;Functional mobility training;Therapeutic activities;Therapeutic exercise;Balance training;Neuromuscular re-education;Patient/family education;Manual techniques;Passive range of motion;Dry needling;Energy conservation    PT Next Visit  Plan back precautions for comfort given multiple compression fxs; general strengthening, endurance, balance training program assess [place] to HEP    PT Home Exercise Plan Access Code: [email or web address]  URL: [email or web address]  Date: 10/20/2021  Prepared by: Ethel Rana    Exercises  Seated Long Arc Quad - 1 x daily - 7 x weekly - 1 sets - 10 reps  Seated Hip Abduction - 1 x daily - 7 x weekly - 1 sets - 10 reps  Seated Heel Toe Raises - 1 x daily - 7 x weekly - 1 sets - 10 reps    Consulted and Agree with Plan of Care Patient             Patient will benefit from skilled therapeutic intervention in order to improve the following deficits and impairments:  Abnormal gait, Decreased coordination, Difficulty walking, Cardiopulmonary status limiting activity, Decreased endurance, Decreased safety awareness, Decreased activity tolerance, Pain, Decreased balance, Impaired flexibility, Improper body mechanics, Decreased mobility, Decreased strength, Postural dysfunction  Visit Diagnosis: Muscle weakness (generalized)  Unsteadiness on feet  Chronic bilateral low back pain without sciatica  Abnormal posture     Problem List Patient Active Problem List   Diagnosis Date Noted   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/09/2021   Pneumonia due to COVID-19 virus 07/01/2020   Macrocytic anemia 07/01/2020   Thrombocytopenia ([place]) 07/01/2020   Acute hypoxemic respiratory failure due to COVID-19 ([place]) 07/01/2020   Cellulitis of left lower extremity 04/30/2017   Cellulitis 04/30/2017   Edema of left lower extremity 04/23/2017   AKI (acute kidney  injury) ([place]) 04/23/2017   ARF (acute renal failure) ([place]) 04/22/2017   DVT of lower extremity, bilateral ([place]) 02/10/2015   PE (pulmonary embolism)    Shortness of breath    Normocytic anemia 02/07/2015   DVT (deep venous thrombosis) (HCC)    Acute pulmonary embolism ([place]) 02/06/2015   Scoliosis of thoracic spine 02/06/2015   GERD  (gastroesophageal reflux disease) 02/06/2015   Acute respiratory failure with hypoxia ([place]) 02/06/2015   Acute kidney injury ([place]) 02/06/2015   Elevated troponin 02/06/2015    Marcelina Morel, DPT 10/20/2021, 10:20 AM  [facility]. [place], [place], [address] Phone: [phone]   Fax:  [phone]  Name: ADIYAH LAME MRN: [id number] Date of Birth: [birth year]

## 2021-10-24 ENCOUNTER — Telehealth: Payer: Self-pay

## 2021-10-24 ENCOUNTER — Other Ambulatory Visit: Payer: Self-pay

## 2021-10-24 ENCOUNTER — Ambulatory Visit: Payer: Medicare Other | Admitting: Physical Therapy

## 2021-10-24 ENCOUNTER — Encounter: Payer: Self-pay | Admitting: Physical Therapy

## 2021-10-24 DIAGNOSIS — M6281 Muscle weakness (generalized): Secondary | ICD-10-CM

## 2021-10-24 DIAGNOSIS — R293 Abnormal posture: Secondary | ICD-10-CM

## 2021-10-24 DIAGNOSIS — R0781 Pleurodynia: Secondary | ICD-10-CM

## 2021-10-24 DIAGNOSIS — G8929 Other chronic pain: Secondary | ICD-10-CM

## 2021-10-24 DIAGNOSIS — R2681 Unsteadiness on feet: Secondary | ICD-10-CM

## 2021-10-24 NOTE — Telephone Encounter (Signed)
[facility] called stating the patient was there but was thinking she had already gotten the xray of her left ribs done already. Informed her that she had only had the thoracic xray done and he ordered the left ribs xray. She stated she has been having pain on the right side now and wants to have an xray done of those as well. She wants to know if an order could be placed for the right side and she will get both done at the same time. Please advise.

## 2021-10-24 NOTE — Therapy (Signed)
[place]. [place], [place], [address] Phone: [phone]   Fax:  [phone]  Physical Therapy Treatment  Patient Details  Name: [name] May MRN: [id number] Date of Birth: [birth year] Referring Provider (PT): Alysia Penna   Encounter Date: 10/24/2021   PT End of Session - 10/24/21 1154     Visit Number 3    Number of Visits 17    Date for PT Re-Evaluation 12/05/21    Authorization Type UHC MCR    Authorization Time Period 10/10/21 to 12/05/21    Progress Note Due on Visit 10    PT Start Time 1102    PT Stop Time 1143    PT Time Calculation (min) 41 min    Activity Tolerance Patient tolerated treatment well    Behavior During Therapy [facility] for tasks assessed/performed             Past Medical History:  Diagnosis Date   Allergy    Anemia    Arthritis    left knee   Back pain    Bipolar disorder (HCC)    Chronic headaches    COPD (chronic obstructive pulmonary disease) ([place])    Degenerative disorder of bone    Depression    Esophageal ulcer    history   GERD (gastroesophageal reflux disease)    Hyperlipidemia    Peripheral vascular disease ([place])    Pulmonary embolism ([place])    Scoliosis     Past Surgical History:  Procedure Laterality Date   ABDOMINAL HYSTERECTOMY     ANKLE SURGERY     COLONOSCOPY     EYE SURGERY     caratacs   FEMORAL ARTERY EXPLORATION  05/16/2017   Procedure: STENT OF BILATERAL COMMON FEMORAL VEIN WITH 16 X 90 WALL STENT;  Surgeon: Waynetta Sandy, MD;  Location: [place];  Service: Vascular;;   hemroidectomy     INSERTION OF ILIAC STENT Bilateral 05/16/2017   Procedure: STENT OF BILATERAL COMMON AND EXTERNAL ILIAC VEIN WITH 18 X 90 WALL STENT;  Surgeon: Waynetta Sandy, MD;  Location: [place];  Service: Vascular;  Laterality: Bilateral;   INTRAVASCULAR ULTRASOUND/IVUS N/A 05/06/2017   Procedure: INTRAVASCULAR ULTRASOUND/IVUS;  Surgeon: Waynetta Sandy, MD;  Location: [place];  Service: Vascular;  Laterality: N/A;   IVC FILTER INSERTION N/A 05/16/2017   Procedure: MECHANICAL THROMBECTOMY OF IVC FILTER;  Surgeon: Waynetta Sandy, MD;  Location: [place];  Service: Vascular;  Laterality: N/A;   LAMINECTOMY     LOWER EXTREMITY VENOGRAPHY N/A 05/14/2017   Procedure: Lower Extremity Venography;  Surgeon: Serafina Mitchell, MD;  Location: [place] CV [facility];  Service: Cardiovascular;  Laterality: N/A;   NASAL SINUS SURGERY     PERCUTANEOUS VENOUS THROMBECTOMY,LYSIS WITH INTRAVASCULAR ULTRASOUND (IVUS) N/A 05/16/2017   Procedure: PERCUTANEOUS VENOUS THROMBECTOMY AND LYSIS WITH INTRAVASCULAR ULTRASOUND (IVUS);  Surgeon: Waynetta Sandy, MD;  Location: [place];  Service: Vascular;  Laterality: N/A;   VENA CAVA FILTER PLACEMENT N/A 05/16/2017   Procedure: RETRIEVAL INFERIOR VENA-CAVA FILTER;  Surgeon: Waynetta Sandy, MD;  Location: [place];  Service: Vascular;  Laterality: N/A;   VENOGRAM Bilateral 05/16/2017   Procedure: VENOGRAM OF IVC;  Surgeon: Waynetta Sandy, MD;  Location: [place];  Service: Vascular;  Laterality: Bilateral;    There were no vitals filed for this visit.   Subjective Assessment - 10/24/21 1104     Subjective I'm still having a lot of pain  in my right ribs after having helped removed items from neighbors apartment. Its really bothering me still.    Pertinent History PVD, scoliosi, GERD, osteoporosis, osteopenia    Currently in Pain? Yes    Pain Score 7     Pain Location Rib cage    Pain Orientation Right    Pain Descriptors / Indicators Sharp    Pain Type Acute pain    Aggravating Factors  moving items. position transitions    Pain Relieving Factors medicine, sitting and sleeping in a specific position in the corner of the sofa with a heating pad    Effect of Pain on Daily Activities moderate                               [facility] Adult PT Treatment/Exercise - 10/24/21  0001       Exercises   Exercises Knee/Hip      Knee/Hip Exercises: Standing   Heel Raises Both;1 set;10 reps    Heel Raises Limitations heel and toe    Hip Flexion Both;1 set;10 reps    Hip Flexion Limitations BUE support at steps    Forward Step Up Both;1 set;10 reps    Forward Step Up Limitations 4 inch step BUE support    Functional Squat 1 set;10 reps    Functional Squat Limitations mod cues for form    Other Standing Knee Exercises hip hikes 1x10 R LE only due to rib pain                 Balance Exercises - 10/24/21 0001       Balance Exercises: Standing   Standing Eyes Opened Narrow base of support (BOS);Foam/compliant surface;3 reps;30 secs    Tandem Stance Eyes open;3 reps;20 secs    Tandem Gait Forward;Retro;2 reps   in // bars   Marching Foam/compliant surface;Dynamic;20 reps   on blue pad   Heel Raises Both;20 reps   on blue pad   Other Standing Exercises toe taps on 4 inch step while standing on foam pad 1x20                PT Education - 10/24/21 1154     Education Details exercise form and purpose    Person(s) Educated Patient    Methods Explanation    Comprehension Verbalized understanding              PT Short Term Goals - 10/20/21 1018       PT SHORT TERM GOAL #1   Title Will be compliant with appropriate HEP to be updated PRN    Time 3    Period Weeks    Status On-going    Target Date 11/07/21      PT SHORT TERM GOAL #2   Title Will consistently and safely use appropriate LRAD at home and in community in order to reduce fall risk/improve community access    Baseline would benefit from rollator at eval    Time 4    Period Weeks    Status On-going      PT SHORT TERM GOAL #3   Title Will report pain as being no more than 2/10 at worst in order to improve QOL and functional task tolerance    Time 2    Period Weeks    Status On-going      PT SHORT TERM GOAL #4   Title Will be able to complete bed  mobility and functional  lifting with good mechanics in order to protect back and reduce further risk of additional compression fractures    Time 3    Period Weeks    Status On-going               PT Long Term Goals - 10/10/21 1754       PT LONG TERM GOAL #1   Title MMT will improve by at least one grade in core and all other weak groups in order to reduce pain/improve function    Time 8    Period Weeks    Status New    Target Date 12/05/21      PT LONG TERM GOAL #2   Title Will score at least 48 on the Berg in order to show reduced fall risk    Time 8    Period Weeks    Status New      PT LONG TERM GOAL #3   Title Will be able to ambulate community distances with [facility] and no unsteadiness in order to improve community access with LRAD    Time 8    Period Weeks    Status New      PT LONG TERM GOAL #4   Title Will be compliant with appropriate gym based program such as silver sneakers in order to maintain functional gains and reduce risk of backslide of physical gains from PT    Time 8    Period Weeks    Status New                   Plan - 10/24/21 1155     Clinical Impression Statement Ms. Micke arrives today doing OK- still having a lot of pain from what she assumes are rib fractures from carrying items out of her friends apartment. Educated on signs that would require her to go to MD for check up. Progressed strength and balance as able today within pain limitations. Will continue efforts and progress as able.    Personal Factors and Comorbidities Age;Behavior Pattern;Social Background;Fitness;Time since onset of injury/illness/exacerbation;Past/Current Experience    Examination-Activity Limitations Squat;Bed Mobility;Lift;Stairs;Bend;Locomotion Level;Stand;Reach Overhead;Carry;Transfers;Sit;Sleep    Examination-Participation Restrictions Church;Cleaning;Shop;Community Activity;Volunteer;Meal Prep;Yard Work    [employer]chant [employer] officer Evolving/Moderate complexity     Clinical Decision Making Moderate    Rehab Potential Good    PT Frequency 2x / week    PT Duration 8 weeks    PT Treatment/Interventions ADLs/Self Care Home Management;Cryotherapy;Electrical Stimulation;Iontophoresis 4mg /ml Dexamethasone;Moist Heat;Ultrasound;DME Instruction;Gait training;Stair training;Functional mobility training;Therapeutic activities;Therapeutic exercise;Balance training;Neuromuscular re-education;Patient/family education;Manual techniques;Passive range of motion;Dry needling;Energy conservation    PT Next Visit Plan back precautions for comfort given multiple compression fxs; general strengthening, endurance, balance training program assess toelrance to HEP    PT Home Exercise Plan Access Code: [email or web address]  URL: [email or web address]  Date: 10/20/2021  Prepared by: Ethel Rana    Exercises  Seated Long Arc Quad - 1 x daily - 7 x weekly - 1 sets - 10 reps  Seated Hip Abduction - 1 x daily - 7 x weekly - 1 sets - 10 reps  Seated Heel Toe Raises - 1 x daily - 7 x weekly - 1 sets - 10 reps    Consulted and Agree with Plan of Care Patient             Patient will benefit from skilled therapeutic intervention in order to improve the following deficits and impairments:  Abnormal gait, Decreased coordination, Difficulty walking, Cardiopulmonary status  limiting activity, Decreased endurance, Decreased safety awareness, Decreased activity tolerance, Pain, Decreased balance, Impaired flexibility, Improper body mechanics, Decreased mobility, Decreased strength, Postural dysfunction  Visit Diagnosis: Muscle weakness (generalized)  Unsteadiness on feet  Chronic bilateral low back pain without sciatica  Abnormal posture     Problem List Patient Active Problem List   Diagnosis Date Noted   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/09/2021   Pneumonia due to COVID-19 virus 07/01/2020   Macrocytic anemia 07/01/2020   Thrombocytopenia ([place]) 07/01/2020   Acute  hypoxemic respiratory failure due to COVID-19 ([place]) 07/01/2020   Cellulitis of left lower extremity 04/30/2017   Cellulitis 04/30/2017   Edema of left lower extremity 04/23/2017   AKI (acute kidney injury) ([place]) 04/23/2017   ARF (acute renal failure) ([place]) 04/22/2017   DVT of lower extremity, bilateral ([place]) 02/10/2015   PE (pulmonary embolism)    Shortness of breath    Normocytic anemia 02/07/2015   DVT (deep venous thrombosis) (HCC)    Acute pulmonary embolism ([place]) 02/06/2015   Scoliosis of thoracic spine 02/06/2015   GERD (gastroesophageal reflux disease) 02/06/2015   Acute respiratory failure with hypoxia ([place]) 02/06/2015   Acute kidney injury ([place]) 02/06/2015   Elevated troponin 02/06/2015   Ann Lions PT, DPT, PN2   Supplemental Physical Therapist [facility]    Pager [phone] Acute Rehab Office [place]. [place], [place], [address] Phone: [phone]   Fax:  [phone]  Name: [name] May MRN: [id number] Date of Birth: [birth year]

## 2021-10-24 NOTE — Telephone Encounter (Signed)
Order for xray on right rib put in for patient

## 2021-10-27 ENCOUNTER — Ambulatory Visit: Payer: Medicare Other | Admitting: Physical Therapy

## 2021-10-27 ENCOUNTER — Other Ambulatory Visit: Payer: Self-pay

## 2021-10-27 ENCOUNTER — Encounter: Payer: Self-pay | Admitting: Physical Therapy

## 2021-10-27 DIAGNOSIS — M6281 Muscle weakness (generalized): Secondary | ICD-10-CM

## 2021-10-27 DIAGNOSIS — G8929 Other chronic pain: Secondary | ICD-10-CM

## 2021-10-27 DIAGNOSIS — R293 Abnormal posture: Secondary | ICD-10-CM

## 2021-10-27 DIAGNOSIS — R2681 Unsteadiness on feet: Secondary | ICD-10-CM

## 2021-10-27 NOTE — Patient Instructions (Signed)
Access Code: [email or web address] URL: [email or web address] Date: 10/27/2021 Prepared by: Ethel Rana  Exercises Standing Hip Abduction with Counter Support - 1 x daily - 7 x weekly - 1 sets - 10 reps Standing March with Counter Support - 1 x daily - 7 x weekly - 1 sets - 10 reps Standing Hip Extension with Counter Support - 1 x daily - 7 x weekly - 1 sets - 10 reps Heel Raises with Counter Support - 1 x daily - 7 x weekly - 1 sets - 10 reps Standing Single Leg Stance with Counter Support - 1 x daily - 7 x weekly - 1 sets - 10 reps

## 2021-10-27 NOTE — Therapy (Signed)
[place]. [place], [place], [address] Phone: [phone]   Fax:  [phone]  Physical Therapy Treatment  Patient Details  Name: [name] May MRN: [id number] Date of Birth: [birth year] Referring Provider (PT): Alysia Penna   Encounter Date: 10/27/2021   PT End of Session - 10/27/21 1012     Visit Number 4    Number of Visits 17    Date for PT Re-Evaluation 12/05/21    Authorization Type UHC MCR    Authorization Time Period 10/10/21 to 12/05/21    Progress Note Due on Visit 10    PT Start Time 0932    PT Stop Time 1011    PT Time Calculation (min) 39 min    Activity Tolerance Patient tolerated treatment well    Behavior During Therapy [facility] for tasks assessed/performed             Past Medical History:  Diagnosis Date   Allergy    Anemia    Arthritis    left knee   Back pain    Bipolar disorder (HCC)    Chronic headaches    COPD (chronic obstructive pulmonary disease) ([place])    Degenerative disorder of bone    Depression    Esophageal ulcer    history   GERD (gastroesophageal reflux disease)    Hyperlipidemia    Peripheral vascular disease ([place])    Pulmonary embolism ([place])    Scoliosis     Past Surgical History:  Procedure Laterality Date   ABDOMINAL HYSTERECTOMY     ANKLE SURGERY     COLONOSCOPY     EYE SURGERY     caratacs   FEMORAL ARTERY EXPLORATION  05/16/2017   Procedure: STENT OF BILATERAL COMMON FEMORAL VEIN WITH 16 X 90 WALL STENT;  Surgeon: Waynetta Sandy, MD;  Location: [place];  Service: Vascular;;   hemroidectomy     INSERTION OF ILIAC STENT Bilateral 05/16/2017   Procedure: STENT OF BILATERAL COMMON AND EXTERNAL ILIAC VEIN WITH 18 X 90 WALL STENT;  Surgeon: Waynetta Sandy, MD;  Location: [place];  Service: Vascular;  Laterality: Bilateral;   INTRAVASCULAR ULTRASOUND/IVUS N/A 05/06/2017   Procedure: INTRAVASCULAR ULTRASOUND/IVUS;  Surgeon: Waynetta Sandy, MD;  Location: [place];  Service: Vascular;  Laterality: N/A;   IVC FILTER INSERTION N/A 05/16/2017   Procedure: MECHANICAL THROMBECTOMY OF IVC FILTER;  Surgeon: Waynetta Sandy, MD;  Location: [place];  Service: Vascular;  Laterality: N/A;   LAMINECTOMY     LOWER EXTREMITY VENOGRAPHY N/A 05/14/2017   Procedure: Lower Extremity Venography;  Surgeon: Serafina Mitchell, MD;  Location: [place] [facility];  Service: Cardiovascular;  Laterality: N/A;   NASAL SINUS SURGERY     PERCUTANEOUS VENOUS THROMBECTOMY,LYSIS WITH INTRAVASCULAR ULTRASOUND (IVUS) N/A 05/16/2017   Procedure: PERCUTANEOUS VENOUS THROMBECTOMY AND LYSIS WITH INTRAVASCULAR ULTRASOUND (IVUS);  Surgeon: Waynetta Sandy, MD;  Location: [place];  Service: Vascular;  Laterality: N/A;   VENA CAVA FILTER PLACEMENT N/A 05/16/2017   Procedure: RETRIEVAL INFERIOR VENA-CAVA FILTER;  Surgeon: Waynetta Sandy, MD;  Location: [place];  Service: Vascular;  Laterality: N/A;   VENOGRAM Bilateral 05/16/2017   Procedure: VENOGRAM OF IVC;  Surgeon: Waynetta Sandy, MD;  Location: [place];  Service: Vascular;  Laterality: Bilateral;    There were no vitals filed for this visit.   Subjective Assessment - 10/27/21 0941     Subjective R ribs still bothering her. She has  run out of Tylenol. She is trying to perform HEP as consistently as possible.    Pertinent History PVD, scoliosi, GERD, osteoporosis, osteopenia    Currently in Pain? Yes    Pain Score 6     Pain Location Rib cage    Pain Orientation Right    Pain Descriptors / Indicators Sharp    Pain Type Acute pain    Pain Onset More than a month ago    Pain Frequency Constant    Aggravating Factors  Turning causes sharp pains                               [facility] Adult PT Treatment/Exercise - 10/27/21 0001       Lumbar Exercises: Standing   Other Standing Lumbar Exercises Stood with back towall, red physioball positioned in lower back.  Patient performed marching x 10 with each leg and then rotations against the physioball for trunk stability.    Other Standing Lumbar Exercises Reviewed HEP stanidng hip flexiomn, abd, extension, march and heel raises. Also SLS with BUE support on counter. She performed 3-5 reps of each to ensure she understood.      Knee/Hip Exercises: Standing   Forward Step Up Both;10 reps    Forward Step Up Limitations 2" Airex pad, used cane for balance as well.    Other Standing Knee Exercises Side to side stepping x 10 each direction iwth cane and CGA.      Moist Heat Therapy   Number Minutes Moist Heat 10 Minutes    Moist Heat Location Lumbar Spine                     PT Education - 10/27/21 1012     Education Details Updated HEP to be performed in stand when tolerated. Perform seated HEP when pain is too great for standing.    Person(s) Educated Patient    Methods Explanation;Demonstration;Handout    Comprehension Returned demonstration;Verbalized understanding              PT Short Term Goals - 10/27/21 0954       PT SHORT TERM GOAL #1   Title Will be compliant with appropriate HEP to be updated PRN    Time 2    Period Weeks    Status On-going    Target Date 11/07/21      PT SHORT TERM GOAL #2   Title Will consistently and safely use appropriate LRAD at home and in community in order to reduce fall risk/improve community access    Baseline She has a rollator which she uses for longer excursions outside of the house.    Time 3    Period Weeks    Status On-going      PT SHORT TERM GOAL #3   Title Will report pain as being no more than 2/10 at worst in order to improve QOL and functional task tolerance    Baseline 6/10    Time 1    Period Weeks    Status On-going      PT SHORT TERM GOAL #4   Title Will be able to complete bed mobility and functional lifting with good mechanics in order to protect back and reduce further risk of additional compression fractures     Time 3    Period Weeks    Status On-going  PT Long Term Goals - 10/10/21 1754       PT LONG TERM GOAL #1   Title MMT will improve by at least one grade in core and all other weak groups in order to reduce pain/improve function    Time 8    Period Weeks    Status New    Target Date 12/05/21      PT LONG TERM GOAL #2   Title Will score at least 48 on the Berg in order to show reduced fall risk    Time 8    Period Weeks    Status New      PT LONG TERM GOAL #3   Title Will be able to ambulate community distances with [facility] and no unsteadiness in order to improve community access with LRAD    Time 8    Period Weeks    Status New      PT LONG TERM GOAL #4   Title Will be compliant with appropriate gym based program such as silver sneakers in order to maintain functional gains and reduce risk of backslide of physical gains from PT    Time 8    Period Weeks    Status New                   Plan - 10/27/21 1013     Clinical Impression Statement Patient reports continued pain in R ribcage. Startd with some MH for pain relief, then she performed multiple stanidng activities to faciltiate balance, trunk stability and LE strength. Updated HEP to include standing activities to increase trunk stability, but encouraged her to use her pian as her guide and to perform seated activities if pain is too great.    Personal Factors and Comorbidities Age;Behavior Pattern;Social Background;Fitness;Time since onset of injury/illness/exacerbation;Past/Current Experience    Examination-Activity Limitations Squat;Bed Mobility;Lift;Stairs;Bend;Locomotion Level;Stand;Reach Overhead;Carry;Transfers;Sit;Sleep    Examination-Participation Restrictions Church;Cleaning;Shop;Community Activity;Volunteer;Meal Prep;Yard Work    Merchant [employer] officer Evolving/Moderate complexity    Clinical Decision Making Moderate    Rehab Potential Good    PT Frequency 2x / week    PT  Duration 8 weeks    PT Treatment/Interventions ADLs/Self Care Home Management;Cryotherapy;Electrical Stimulation;Iontophoresis 4mg /ml Dexamethasone;Moist Heat;Ultrasound;DME Instruction;Gait training;Stair training;Functional mobility training;Therapeutic activities;Therapeutic exercise;Balance training;Neuromuscular re-education;Patient/family education;Manual techniques;Passive range of motion;Dry needling;Energy conservation    PT Next Visit Plan back precautions for comfort given multiple compression fxs; general strengthening, endurance, balance training program assess [place]elrance to HEP    PT Home Exercise Plan Access Code: [email or web address]  URL: [email or web address]  Date: 10/20/2021  Prepared by: Ethel Rana    Exercises  Seated Long Arc Quad - 1 x daily - 7 x weekly - 1 sets - 10 reps  Seated Hip Abduction - 1 x daily - 7 x weekly - 1 sets - 10 reps  Seated Heel Toe Raises - 1 x daily - 7 x weekly - 1 sets - 10 reps    Consulted and Agree with Plan of Care Patient             Patient will benefit from skilled therapeutic intervention in order to improve the following deficits and impairments:  Abnormal gait, Decreased coordination, Difficulty walking, Cardiopulmonary status limiting activity, Decreased endurance, Decreased safety awareness, Decreased activity tolerance, Pain, Decreased balance, Impaired flexibility, Improper body mechanics, Decreased mobility, Decreased strength, Postural dysfunction  Visit Diagnosis: Muscle weakness (generalized)  Unsteadiness on feet  Chronic bilateral low back pain without sciatica  Abnormal posture     Problem  List Patient Active Problem List   Diagnosis Date Noted   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/09/2021   Pneumonia due to COVID-19 virus 07/01/2020   Macrocytic anemia 07/01/2020   Thrombocytopenia ([place]) 07/01/2020   Acute hypoxemic respiratory failure due to COVID-19 ([place]) 07/01/2020   Cellulitis of left lower  extremity 04/30/2017   Cellulitis 04/30/2017   Edema of left lower extremity 04/23/2017   AKI (acute kidney injury) ([place]) 04/23/2017   ARF (acute renal failure) ([place]) 04/22/2017   DVT of lower extremity, bilateral ([place]) 02/10/2015   PE (pulmonary embolism)    Shortness of breath    Normocytic anemia 02/07/2015   DVT (deep venous thrombosis) (HCC)    Acute pulmonary embolism ([place]) 02/06/2015   Scoliosis of thoracic spine 02/06/2015   GERD (gastroesophageal reflux disease) 02/06/2015   Acute respiratory failure with hypoxia ([place]) 02/06/2015   Acute kidney injury ([place]) 02/06/2015   Elevated troponin 02/06/2015    Marcelina Morel, DPT 10/27/2021, 10:17 AM  [place]. [place], [place], [address] Phone: [phone]   Fax:  [phone]  Name: JAWANA REAGOR MRN: [id number] Date of Birth: [birth year]

## 2021-10-31 ENCOUNTER — Other Ambulatory Visit: Payer: Self-pay

## 2021-10-31 ENCOUNTER — Encounter: Payer: Self-pay | Admitting: Physical Therapy

## 2021-10-31 ENCOUNTER — Ambulatory Visit: Payer: Medicare Other | Admitting: Physical Therapy

## 2021-10-31 DIAGNOSIS — R293 Abnormal posture: Secondary | ICD-10-CM

## 2021-10-31 DIAGNOSIS — M6281 Muscle weakness (generalized): Secondary | ICD-10-CM | POA: Diagnosis not present

## 2021-10-31 DIAGNOSIS — M545 Low back pain, unspecified: Secondary | ICD-10-CM

## 2021-10-31 DIAGNOSIS — R2681 Unsteadiness on feet: Secondary | ICD-10-CM

## 2021-10-31 NOTE — Therapy (Signed)
[place]. [place], [place], [address] Phone: [phone]   Fax:  [phone]  Physical Therapy Treatment  Patient Details  Name: [name] May MRN: [id number] Date of Birth: Mar 15, 1941 Referring Provider (PT): Alysia Penna   Encounter Date: 10/31/2021   PT End of Session - 10/31/21 1220     Visit Number 5    Number of Visits 17    Date for PT Re-Evaluation 12/05/21    Authorization Type UHC MCR    Authorization Time Period 10/10/21 to 12/05/21    Progress Note Due on Visit 10    PT Start Time 1103    PT Stop Time 1144    PT Time Calculation (min) 41 min    Activity Tolerance Patient tolerated treatment well    Behavior During Therapy [facility] for tasks assessed/performed             Past Medical History:  Diagnosis Date   Allergy    Anemia    Arthritis    left knee   Back pain    Bipolar disorder (HCC)    Chronic headaches    COPD (chronic obstructive pulmonary disease) (HCC)    Degenerative disorder of bone    Depression    Esophageal ulcer    history   GERD (gastroesophageal reflux disease)    Hyperlipidemia    Peripheral vascular disease ([place])    Pulmonary embolism ([place])    Scoliosis     Past Surgical History:  Procedure Laterality Date   ABDOMINAL HYSTERECTOMY     ANKLE SURGERY     COLONOSCOPY     EYE SURGERY     caratacs   FEMORAL ARTERY EXPLORATION  05/16/2017   Procedure: STENT OF BILATERAL COMMON FEMORAL VEIN WITH 16 X 90 WALL STENT;  Surgeon: Waynetta Sandy, MD;  Location: [place];  Service: Vascular;;   hemroidectomy     INSERTION OF ILIAC STENT Bilateral 05/16/2017   Procedure: STENT OF BILATERAL COMMON AND EXTERNAL ILIAC VEIN WITH 18 X 90 WALL STENT;  Surgeon: Waynetta Sandy, MD;  Location: [place];  Service: Vascular;  Laterality: Bilateral;   INTRAVASCULAR ULTRASOUND/IVUS N/A 05/06/2017   Procedure: INTRAVASCULAR ULTRASOUND/IVUS;  Surgeon: Waynetta Sandy, MD;  Location: [place];  Service: Vascular;  Laterality: N/A;   IVC FILTER INSERTION N/A 05/16/2017   Procedure: MECHANICAL THROMBECTOMY OF IVC FILTER;  Surgeon: Waynetta Sandy, MD;  Location: [place];  Service: Vascular;  Laterality: N/A;   LAMINECTOMY     LOWER EXTREMITY VENOGRAPHY N/A 05/14/2017   Procedure: Lower Extremity Venography;  Surgeon: Serafina Mitchell, MD;  Location: [place] [facility];  Service: Cardiovascular;  Laterality: N/A;   NASAL SINUS SURGERY     PERCUTANEOUS VENOUS THROMBECTOMY,LYSIS WITH INTRAVASCULAR ULTRASOUND (IVUS) N/A 05/16/2017   Procedure: PERCUTANEOUS VENOUS THROMBECTOMY AND LYSIS WITH INTRAVASCULAR ULTRASOUND (IVUS);  Surgeon: Waynetta Sandy, MD;  Location: [place];  Service: Vascular;  Laterality: N/A;   VENA CAVA FILTER PLACEMENT N/A 05/16/2017   Procedure: RETRIEVAL INFERIOR VENA-CAVA FILTER;  Surgeon: Waynetta Sandy, MD;  Location: [place];  Service: Vascular;  Laterality: N/A;   VENOGRAM Bilateral 05/16/2017   Procedure: VENOGRAM OF IVC;  Surgeon: Waynetta Sandy, MD;  Location: [place];  Service: Vascular;  Laterality: Bilateral;    There were no vitals filed for this visit.   Subjective Assessment - 10/31/21 1105     Subjective ribs still bothering me on the left,  the right is feeling better. I couldn't grocery shop yesterday, it was cold and I was hurting so I took a pass. Nothing else new is going on, really.    Pertinent History PVD, scoliosi, GERD, osteoporosis, osteopenia    Patient Stated Goals strengthening what we can    Currently in Pain? Yes    Pain Score 4    rib is still very painful, 5/10   Pain Location Back   at scoliotic curve                              [facility] Adult PT Treatment/Exercise - 10/31/21 0001       Lumbar Exercises: Seated   Other Seated Lumbar Exercises hip ABD red TB 1x15 3 second holds; marches 1x20 red TB   with TA set   Other Seated Lumbar Exercises on  blue air pad with TA set: 1x10 BUE flexion, 1x10 B LAQ; dead bug                 Balance Exercises - 10/31/21 0001       Balance Exercises: Standing   Standing Eyes Opened Narrow base of support (BOS);Foam/compliant surface;3 reps;30 secs    Standing Eyes Closed Narrow base of support (BOS);Foam/compliant surface;3 reps;30 secs    Tandem Gait Forward;3 reps;Retro   in // bars   Sidestepping Foam/compliant support;3 reps   in // bars               PT Education - 10/31/21 1220     Education Details exercise form, purpose    Person(s) Educated Patient    Methods Explanation    Comprehension Verbalized understanding              PT Short Term Goals - 10/27/21 0954       PT SHORT TERM GOAL #1   Title Will be compliant with appropriate HEP to be updated PRN    Time 2    Period Weeks    Status On-going    Target Date 11/07/21      PT SHORT TERM GOAL #2   Title Will consistently and safely use appropriate LRAD at home and in community in order to reduce fall risk/improve community access    Baseline She has a rollator which she uses for longer excursions outside of the house.    Time 3    Period Weeks    Status On-going      PT SHORT TERM GOAL #3   Title Will report pain as being no more than 2/10 at worst in order to improve QOL and functional task tolerance    Baseline 6/10    Time 1    Period Weeks    Status On-going      PT SHORT TERM GOAL #4   Title Will be able to complete bed mobility and functional lifting with good mechanics in order to protect back and reduce further risk of additional compression fractures    Time 3    Period Weeks    Status On-going               PT Long Term Goals - 10/10/21 1754       PT LONG TERM GOAL #1   Title MMT will improve by at least one grade in core and all other weak groups in order to reduce pain/improve function    Time 8    Period Weeks  Status New    Target Date 12/05/21      PT LONG TERM  GOAL #2   Title Will score at least 48 on the Berg in order to show reduced fall risk    Time 8    Period Weeks    Status New      PT LONG TERM GOAL #3   Title Will be able to ambulate community distances with [facility] and no unsteadiness in order to improve community access with LRAD    Time 8    Period Weeks    Status New      PT LONG TERM GOAL #4   Title Will be compliant with appropriate gym based program such as silver sneakers in order to maintain functional gains and reduce risk of backslide of physical gains from PT    Time 8    Period Weeks    Status New                   Plan - 10/31/21 1221     Clinical Impression Statement Ms. Agostinelli arrives today doing OK, still having a lot of rib pain on the left. Focused on balance training today but needed lots of rest breaks due to back pain/back mm fatigue this session. Also worked on core strength and endurance to try and help reduce LBP. Will continue efforts as tolerated.    Personal Factors and Comorbidities Age;Behavior Pattern;Social Background;Fitness;Time since onset of injury/illness/exacerbation;Past/Current Experience    Examination-Activity Limitations Squat;Bed Mobility;Lift;Stairs;Bend;Locomotion Level;Stand;Reach Overhead;Carry;Transfers;Sit;Sleep    Examination-Participation Restrictions Church;Cleaning;Shop;Community Activity;Volunteer;Meal Prep;Yard Work    [employer] officer Evolving/Moderate complexity    Clinical Decision Making Moderate    Rehab Potential Good    PT Frequency 2x / week    PT Duration 8 weeks    PT Treatment/Interventions ADLs/Self Care Home Management;Cryotherapy;Electrical Stimulation;Iontophoresis 4mg /ml Dexamethasone;Moist Heat;Ultrasound;DME Instruction;Gait training;Stair training;Functional mobility training;Therapeutic activities;Therapeutic exercise;Balance training;Neuromuscular re-education;Patient/family education;Manual techniques;Passive range of motion;Dry  needling;Energy conservation    PT Next Visit Plan back precautions for comfort given multiple compression fxs; general strengthening, endurance, balance training program assess toelrance to HEP    PT Home Exercise Plan Access Code: [email or web address]  URL: [email or web address]  Date: 10/20/2021  Prepared by: Ethel Rana    Exercises  Seated Long Arc Quad - 1 x daily - 7 x weekly - 1 sets - 10 reps  Seated Hip Abduction - 1 x daily - 7 x weekly - 1 sets - 10 reps  Seated Heel Toe Raises - 1 x daily - 7 x weekly - 1 sets - 10 reps    Consulted and Agree with Plan of Care Patient             Patient will benefit from skilled therapeutic intervention in order to improve the following deficits and impairments:  Abnormal gait, Decreased coordination, Difficulty walking, Cardiopulmonary status limiting activity, Decreased endurance, Decreased safety awareness, Decreased activity tolerance, Pain, Decreased balance, Impaired flexibility, Improper body mechanics, Decreased mobility, Decreased strength, Postural dysfunction  Visit Diagnosis: Muscle weakness (generalized)  Chronic bilateral low back pain without sciatica  Abnormal posture  Unsteadiness on feet     Problem List Patient Active Problem List   Diagnosis Date Noted   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/09/2021   Pneumonia due to COVID-19 virus 07/01/2020   Macrocytic anemia 07/01/2020   Thrombocytopenia ([place]) 07/01/2020   Acute hypoxemic respiratory failure due to COVID-19 ([place]) 07/01/2020   Cellulitis of left lower extremity 04/30/2017   Cellulitis 04/30/2017  Edema of left lower extremity 04/23/2017   AKI (acute kidney injury) ([place]) 04/23/2017   ARF (acute renal failure) ([place]) 04/22/2017   DVT of lower extremity, bilateral ([place]) 02/10/2015   PE (pulmonary embolism)    Shortness of breath    Normocytic anemia 02/07/2015   DVT (deep venous thrombosis) (HCC)    Acute pulmonary embolism ([place]) 02/06/2015    Scoliosis of thoracic spine 02/06/2015   GERD (gastroesophageal reflux disease) 02/06/2015   Acute respiratory failure with hypoxia ([place]) 02/06/2015   Acute kidney injury ([place]) 02/06/2015   Elevated troponin 02/06/2015   Ann Lions PT, DPT, PN2   Supplemental Physical Therapist [facility]    Pager [phone] Acute Rehab Office [place]. [place], [place], [address] Phone: [phone]   Fax:  [phone]  Name: [name] May MRN: [id number] Date of Birth: [birth year]

## 2021-11-03 ENCOUNTER — Encounter: Payer: Self-pay | Admitting: Physical Therapy

## 2021-11-03 ENCOUNTER — Other Ambulatory Visit: Payer: Self-pay

## 2021-11-03 ENCOUNTER — Ambulatory Visit: Payer: Medicare Other | Admitting: Physical Therapy

## 2021-11-03 DIAGNOSIS — G8929 Other chronic pain: Secondary | ICD-10-CM

## 2021-11-03 DIAGNOSIS — M6281 Muscle weakness (generalized): Secondary | ICD-10-CM | POA: Diagnosis not present

## 2021-11-03 DIAGNOSIS — R2681 Unsteadiness on feet: Secondary | ICD-10-CM

## 2021-11-03 DIAGNOSIS — R293 Abnormal posture: Secondary | ICD-10-CM

## 2021-11-03 NOTE — Therapy (Signed)
[place]. [place], [place], [address] Phone: [phone]   Fax:  [phone]  Physical Therapy Treatment  Patient Details  Name: [name] May MRN: [id number] Date of Birth: 04/12/1941 Referring Provider (PT): Alysia Penna   Encounter Date: 11/03/2021   PT End of Session - 11/03/21 1011     Visit Number 6    Number of Visits 17    Date for PT Re-Evaluation 12/05/21    Authorization Type UHC MCR    Authorization Time Period 10/10/21 to 12/05/21    Progress Note Due on Visit 10    PT Start Time 0930    PT Stop Time 1011    PT Time Calculation (min) 41 min    Activity Tolerance Patient tolerated treatment well    Behavior During Therapy [facility] for tasks assessed/performed             Past Medical History:  Diagnosis Date   Allergy    Anemia    Arthritis    left knee   Back pain    Bipolar disorder (HCC)    Chronic headaches    COPD (chronic obstructive pulmonary disease) ([place])    Degenerative disorder of bone    Depression    Esophageal ulcer    history   GERD (gastroesophageal reflux disease)    Hyperlipidemia    Peripheral vascular disease ([place])    Pulmonary embolism ([place])    Scoliosis     Past Surgical History:  Procedure Laterality Date   ABDOMINAL HYSTERECTOMY     ANKLE SURGERY     COLONOSCOPY     EYE SURGERY     caratacs   FEMORAL ARTERY EXPLORATION  05/16/2017   Procedure: STENT OF BILATERAL COMMON FEMORAL VEIN WITH 16 X 90 WALL STENT;  Surgeon: Waynetta Sandy, MD;  Location: [place];  Service: Vascular;;   hemroidectomy     INSERTION OF ILIAC STENT Bilateral 05/16/2017   Procedure: STENT OF BILATERAL COMMON AND EXTERNAL ILIAC VEIN WITH 18 X 90 WALL STENT;  Surgeon: Waynetta Sandy, MD;  Location: [place];  Service: Vascular;  Laterality: Bilateral;   INTRAVASCULAR ULTRASOUND/IVUS N/A 05/06/2017   Procedure: INTRAVASCULAR ULTRASOUND/IVUS;  Surgeon: Waynetta Sandy, MD;  Location: [place];  Service: Vascular;  Laterality: N/A;   IVC FILTER INSERTION N/A 05/16/2017   Procedure: MECHANICAL THROMBECTOMY OF IVC FILTER;  Surgeon: Waynetta Sandy, MD;  Location: [place];  Service: Vascular;  Laterality: N/A;   LAMINECTOMY     LOWER EXTREMITY VENOGRAPHY N/A 05/14/2017   Procedure: Lower Extremity Venography;  Surgeon: Serafina Mitchell, MD;  Location: [place] CV [facility];  Service: Cardiovascular;  Laterality: N/A;   NASAL SINUS SURGERY     PERCUTANEOUS VENOUS THROMBECTOMY,LYSIS WITH INTRAVASCULAR ULTRASOUND (IVUS) N/A 05/16/2017   Procedure: PERCUTANEOUS VENOUS THROMBECTOMY AND LYSIS WITH INTRAVASCULAR ULTRASOUND (IVUS);  Surgeon: Waynetta Sandy, MD;  Location: [place];  Service: Vascular;  Laterality: N/A;   VENA CAVA FILTER PLACEMENT N/A 05/16/2017   Procedure: RETRIEVAL INFERIOR VENA-CAVA FILTER;  Surgeon: Waynetta Sandy, MD;  Location: [place];  Service: Vascular;  Laterality: N/A;   VENOGRAM Bilateral 05/16/2017   Procedure: VENOGRAM OF IVC;  Surgeon: Waynetta Sandy, MD;  Location: [place];  Service: Vascular;  Laterality: Bilateral;    There were no vitals filed for this visit.   Subjective Assessment - 11/03/21 0934     Subjective Patient R ribs are fine. She reports  L ribs are still very sore. Her lateral L upper leg is sore. She points directly to her ITB.    Pertinent History PVD, scoliosi, GERD, osteoporosis, osteopenia    Currently in Pain? Yes    Pain Score 4     Pain Location Rib cage    Pain Orientation Left    Pain Descriptors / Indicators Sharp    Pain Type Acute pain    Pain Onset More than a month ago    Pain Frequency Constant                               [facility] Adult PT Treatment/Exercise - 11/03/21 0001       Ambulation/Gait   Ambulation/Gait Yes    Ambulation/Gait Assistance 6: Modified independent (Device/Increase time)    Ambulation Distance (Feet) 150 Feet    Gait  Comments slightly slow, but stable throughout. No SOB noted.      Lumbar Exercises: Aerobic   Nustep L4 x 6 minutes legs only      Lumbar Exercises: Seated   Sit to Stand 10 reps    Other Seated Lumbar Exercises On Physiodisc- long kicks against yellow Tband resistance, hip abd, knee flexion-10 reps each    Other Seated Lumbar Exercises Physiodisc- moving clock x 60 seconds each direction.      Knee/Hip Exercises: Stretches   ITB Stretch Left;3 reps;20 seconds    Piriformis Stretch Left;3 reps;20 seconds      Manual Therapy   Manual Therapy Myofascial release    Myofascial Release ITB release on L                     PT Education - 11/03/21 1014     Education Details HEP update,    Person(s) Educated Patient    Methods Explanation;Demonstration;Handout    Comprehension Verbalized understanding;Returned demonstration              PT Short Term Goals - 11/03/21 0938       PT SHORT TERM GOAL #1   Title Will be compliant with appropriate HEP to be updated PRN    Status Achieved      PT SHORT TERM GOAL #2   Title Will consistently and safely use appropriate LRAD at home and in community in order to reduce fall risk/improve community access    Baseline She has a rollator which she uses for longer excursions outside of the house.    Status Achieved      PT SHORT TERM GOAL #3   Title Will report pain as being no more than 2/10 at worst in order to improve QOL and functional task tolerance    Baseline 4/10    Time 1    Period Weeks    Status On-going               PT Long Term Goals - 10/10/21 1754       PT LONG TERM GOAL #1   Title MMT will improve by at least one grade in core and all other weak groups in order to reduce pain/improve function    Time 8    Period Weeks    Status New    Target Date 12/05/21      PT LONG TERM GOAL #2   Title Will score at least 48 on the Berg in order to show reduced fall risk    Time 8  Period Weeks     Status New      PT LONG TERM GOAL #3   Title Will be able to ambulate community distances with [facility] and no unsteadiness in order to improve community access with LRAD    Time 8    Period Weeks    Status New      PT LONG TERM GOAL #4   Title Will be compliant with appropriate gym based program such as silver sneakers in order to maintain functional gains and reduce risk of backslide of physical gains from PT    Time 8    Period Weeks    Status New                   Plan - 11/03/21 1012     Clinical Impression Statement Patient reports same rib pain on L, also identified L ITB pain. Therapist performed gentle ITB mobilizaiton, then had patient stretch ITB in sit. he continued work on trunk and LE strength, finished with walking for endurance.    Personal Factors and Comorbidities Age;Behavior Pattern;Social Background;Fitness;Time since onset of injury/illness/exacerbation;Past/Current Experience    Examination-Activity Limitations Squat;Bed Mobility;Lift;Stairs;Bend;Locomotion Level;Stand;Reach Overhead;Carry;Transfers;Sit;Sleep    Examination-Participation Restrictions Church;Cleaning;Shop;Community Activity;Volunteer;Meal Prep;Yard Work    [employer]chant [employer] officer Evolving/Moderate complexity    Rehab Potential Good    PT Frequency 2x / week    PT Duration 8 weeks    PT Treatment/Interventions ADLs/Self Care Home Management;Cryotherapy;Electrical Stimulation;Iontophoresis 4mg /ml Dexamethasone;Moist Heat;Ultrasound;DME Instruction;Gait training;Stair training;Functional mobility training;Therapeutic activities;Therapeutic exercise;Balance training;Neuromuscular re-education;Patient/family education;Manual techniques;Passive range of motion;Dry needling;Energy conservation    PT Next Visit Plan back precautions for comfort given multiple compression fxs; general strengthening, endurance, balance training program assess [place]elrance to HEP    PT Home Exercise Plan Access  Code: [email or web address]  URL: [email or web address]  Date: 10/20/2021  Prepared by: Ethel Rana    Exercises  Seated Long Arc Quad - 1 x daily - 7 x weekly - 1 sets - 10 reps  Seated Hip Abduction - 1 x daily - 7 x weekly - 1 sets - 10 reps  Seated Heel Toe Raises - 1 x daily - 7 x weekly - 1 sets - 10 reps    Consulted and Agree with Plan of Care Patient             Patient will benefit from skilled therapeutic intervention in order to improve the following deficits and impairments:  Abnormal gait, Decreased coordination, Difficulty walking, Cardiopulmonary status limiting activity, Decreased endurance, Decreased safety awareness, Decreased activity tolerance, Pain, Decreased balance, Impaired flexibility, Improper body mechanics, Decreased mobility, Decreased strength, Postural dysfunction  Visit Diagnosis: Muscle weakness (generalized)  Chronic bilateral low back pain without sciatica  Abnormal posture  Unsteadiness on feet     Problem List Patient Active Problem List   Diagnosis Date Noted   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/09/2021   Pneumonia due to COVID-19 virus 07/01/2020   Macrocytic anemia 07/01/2020   Thrombocytopenia ([place]) 07/01/2020   Acute hypoxemic respiratory failure due to COVID-19 ([place]) 07/01/2020   Cellulitis of left lower extremity 04/30/2017   Cellulitis 04/30/2017   Edema of left lower extremity 04/23/2017   AKI (acute kidney injury) ([place]) 04/23/2017   ARF (acute renal failure) ([place]) 04/22/2017   DVT of lower extremity, bilateral ([place]) 02/10/2015   PE (pulmonary embolism)    Shortness of breath    Normocytic anemia 02/07/2015   DVT (deep venous thrombosis) ([place])    Acute pulmonary embolism ([place]) 02/06/2015   Scoliosis  of thoracic spine 02/06/2015   GERD (gastroesophageal reflux disease) 02/06/2015   Acute respiratory failure with hypoxia ([place]) 02/06/2015   Acute kidney injury ([place]) 02/06/2015   Elevated troponin 02/06/2015     Marcelina Morel, DPT 11/03/2021, 10:22 AM  [place]. [place], [place], [address] Phone: [phone]   Fax:  [phone]  Name: [name] May MRN: [id number] Date of Birth: [birth year]

## 2021-11-03 NOTE — Patient Instructions (Signed)
Access Code: [email or web address] URL: [email or web address] Date: 11/03/2021 Prepared by: Ethel Rana  Exercises Seated Piriformis Stretch with Trunk Bend - 1 x daily - 7 x weekly - 3 sets - 15 hold Seated Piriformis Stretch - 1 x daily - 7 x weekly - 3 sets - 15 hold

## 2021-11-07 ENCOUNTER — Ambulatory Visit: Payer: Medicare Other | Admitting: Physical Therapy

## 2021-11-07 ENCOUNTER — Ambulatory Visit
Admission: RE | Admit: 2021-11-07 | Discharge: 2021-11-07 | Disposition: A | Discharge status: Home / Self Care | Payer: Medicare Other | Source: Ambulatory Visit | Attending: Physical Medicine & Rehabilitation | Admitting: Physical Medicine & Rehabilitation

## 2021-11-07 ENCOUNTER — Encounter: Payer: Self-pay | Admitting: Physical Therapy

## 2021-11-07 ENCOUNTER — Other Ambulatory Visit: Payer: Self-pay | Admitting: Physical Medicine & Rehabilitation

## 2021-11-07 ENCOUNTER — Other Ambulatory Visit: Payer: Self-pay

## 2021-11-07 DIAGNOSIS — R293 Abnormal posture: Secondary | ICD-10-CM

## 2021-11-07 DIAGNOSIS — M6281 Muscle weakness (generalized): Secondary | ICD-10-CM

## 2021-11-07 DIAGNOSIS — G8929 Other chronic pain: Secondary | ICD-10-CM

## 2021-11-07 DIAGNOSIS — R0781 Pleurodynia: Secondary | ICD-10-CM

## 2021-11-07 DIAGNOSIS — R2681 Unsteadiness on feet: Secondary | ICD-10-CM

## 2021-11-07 NOTE — Therapy (Signed)
[place]. [place], [place], [address] Phone: [phone]   Fax:  [phone]  Physical Therapy Treatment  Patient Details  Name: [name] May MRN: [id number] Date of Birth: June 10, 1941 Referring Provider (PT): Alysia Penna   Encounter Date: 11/07/2021   PT End of Session - 11/07/21 1154     Visit Number 7    Number of Visits 17    Date for PT Re-Evaluation 12/05/21    Authorization Type UHC MCR    Authorization Time Period 10/10/21 to 12/05/21    Progress Note Due on Visit 10    PT Start Time 1101    PT Stop Time 1141    PT Time Calculation (min) 40 min    Activity Tolerance Patient tolerated treatment well    Behavior During Therapy [facility] for tasks assessed/performed             Past Medical History:  Diagnosis Date   Allergy    Anemia    Arthritis    left knee   Back pain    Bipolar disorder (HCC)    Chronic headaches    COPD (chronic obstructive pulmonary disease) ([place])    Degenerative disorder of bone    Depression    Esophageal ulcer    history   GERD (gastroesophageal reflux disease)    Hyperlipidemia    Peripheral vascular disease ([place])    Pulmonary embolism ([place])    Scoliosis     Past Surgical History:  Procedure Laterality Date   ABDOMINAL HYSTERECTOMY     ANKLE SURGERY     COLONOSCOPY     EYE SURGERY     caratacs   FEMORAL ARTERY EXPLORATION  05/16/2017   Procedure: STENT OF BILATERAL COMMON FEMORAL VEIN WITH 16 X 90 WALL STENT;  Surgeon: Waynetta Sandy, MD;  Location: [place];  Service: Vascular;;   hemroidectomy     INSERTION OF ILIAC STENT Bilateral 05/16/2017   Procedure: STENT OF BILATERAL COMMON AND EXTERNAL ILIAC VEIN WITH 18 X 90 WALL STENT;  Surgeon: Waynetta Sandy, MD;  Location: [place];  Service: Vascular;  Laterality: Bilateral;   INTRAVASCULAR ULTRASOUND/IVUS N/A 05/06/2017   Procedure: INTRAVASCULAR ULTRASOUND/IVUS;  Surgeon: Waynetta Sandy, MD;  Location: [place];  Service: Vascular;  Laterality: N/A;   IVC FILTER INSERTION N/A 05/16/2017   Procedure: MECHANICAL THROMBECTOMY OF IVC FILTER;  Surgeon: Waynetta Sandy, MD;  Location: [place];  Service: Vascular;  Laterality: N/A;   LAMINECTOMY     LOWER EXTREMITY VENOGRAPHY N/A 05/14/2017   Procedure: Lower Extremity Venography;  Surgeon: Serafina Mitchell, MD;  Location: [place] [facility];  Service: Cardiovascular;  Laterality: N/A;   NASAL SINUS SURGERY     PERCUTANEOUS VENOUS THROMBECTOMY,LYSIS WITH INTRAVASCULAR ULTRASOUND (IVUS) N/A 05/16/2017   Procedure: PERCUTANEOUS VENOUS THROMBECTOMY AND LYSIS WITH INTRAVASCULAR ULTRASOUND (IVUS);  Surgeon: Waynetta Sandy, MD;  Location: [facility];  Service: Vascular;  Laterality: N/A;   VENA CAVA FILTER PLACEMENT N/A 05/16/2017   Procedure: RETRIEVAL INFERIOR VENA-CAVA FILTER;  Surgeon: Waynetta Sandy, MD;  Location: [place];  Service: Vascular;  Laterality: N/A;   VENOGRAM Bilateral 05/16/2017   Procedure: VENOGRAM OF IVC;  Surgeon: Waynetta Sandy, MD;  Location: [facility];  Service: Vascular;  Laterality: Bilateral;    There were no vitals filed for this visit.   Subjective Assessment - 11/07/21 1105     Subjective I'm doing OK, I'd like some band  to keep working my legs. My ribs on the left are coming along, its a lot better but its still sore.    Pertinent History PVD, scoliosi, GERD, osteoporosis, osteopenia    Patient Stated Goals strengthening what we can    Currently in Pain? Yes    Pain Score 3     Pain Location Back    Pain Orientation Right;Left    Pain Descriptors / Indicators Burning                               [facility] Adult PT Treatment/Exercise - 11/07/21 0001       Lumbar Exercises: Standing   Other Standing Lumbar Exercises standing SLS with march and TA set 1x10 B      Lumbar Exercises: Seated   Other Seated Lumbar Exercises on physiodisc: LAQs with yellow  TB 1x15 B; clams iwth yellow TB 1x15; dead bugs 1x10 B yellow TB around knees    Other Seated Lumbar Exercises scapular retraction, backwards shoulder rolls x15 B; lateral trunk cruches 1x10 B                     PT Education - 11/07/21 1154     Education Details exercise form/purpose    Person(s) Educated Patient    Methods Explanation    Comprehension Verbalized understanding              PT Short Term Goals - 11/03/21 0938       PT SHORT TERM GOAL #1   Title Will be compliant with appropriate HEP to be updated PRN    Status Achieved      PT SHORT TERM GOAL #2   Title Will consistently and safely use appropriate LRAD at home and in community in order to reduce fall risk/improve community access    Baseline She has a rollator which she uses for longer excursions outside of the house.    Status Achieved      PT SHORT TERM GOAL #3   Title Will report pain as being no more than 2/10 at worst in order to improve QOL and functional task tolerance    Baseline 4/10    Time 1    Period Weeks    Status On-going               PT Long Term Goals - 10/10/21 1754       PT LONG TERM GOAL #1   Title MMT will improve by at least one grade in core and all other weak groups in order to reduce pain/improve function    Time 8    Period Weeks    Status New    Target Date 12/05/21      PT LONG TERM GOAL #2   Title Will score at least 48 on the Berg in order to show reduced fall risk    Time 8    Period Weeks    Status New      PT LONG TERM GOAL #3   Title Will be able to ambulate community distances with [facility] and no unsteadiness in order to improve community access with LRAD    Time 8    Period Weeks    Status New      PT LONG TERM GOAL #4   Title Will be compliant with appropriate gym based program such as silver sneakers in order to maintain functional gains and reduce  risk of backslide of physical gains from PT    Time 8    Period Weeks    Status New                    Plan - 11/07/21 1154     Clinical Impression Statement [name] arrives today doing OK, rib pain is getting better. Spent a lot of time working on core strength on physiodisc as well as postural strengthening today; did need multiple rest breaks today due to fatigue in lumbar and postural mm. Tolerated session well today overall. Updated HEP. Will continue to progress as able.    Personal Factors and Comorbidities Age;Behavior Pattern;Social Background;Fitness;Time since onset of injury/illness/exacerbation;Past/Current Experience    Examination-Activity Limitations Squat;Bed Mobility;Lift;Stairs;Bend;Locomotion Level;Stand;Reach Overhead;Carry;Transfers;Sit;Sleep    Examination-Participation Restrictions Church;Cleaning;Shop;Community Activity;Volunteer;Meal Prep;Yard Work    Merchant navy officer Evolving/Moderate complexity    Clinical Decision Making Moderate    Rehab Potential Good    PT Frequency 2x / week    PT Duration 8 weeks    PT Treatment/Interventions ADLs/Self Care Home Management;Cryotherapy;Electrical Stimulation;Iontophoresis 4mg /ml Dexamethasone;Moist Heat;Ultrasound;DME Instruction;Gait training;Stair training;Functional mobility training;Therapeutic activities;Therapeutic exercise;Balance training;Neuromuscular re-education;Patient/family education;Manual techniques;Passive range of motion;Dry needling;Energy conservation    PT Next Visit Plan back precautions for comfort given multiple compression fxs; general strengthening, endurance, balance training program assess toelrance to HEP    PT Home Exercise Plan Access Code: [email or web address]  URL: [email or web address]  Date: 10/20/2021  Prepared by: Ethel Rana    Exercises  Seated Long Arc Quad - 1 x daily - 7 x weekly - 1 sets - 10 reps  Seated Hip Abduction - 1 x daily - 7 x weekly - 1 sets - 10 reps  Seated Heel Toe Raises - 1 x daily - 7 x weekly - 1 sets - 10 reps    Consulted  and Agree with Plan of Care Patient             Patient will benefit from skilled therapeutic intervention in order to improve the following deficits and impairments:  Abnormal gait, Decreased coordination, Difficulty walking, Cardiopulmonary status limiting activity, Decreased endurance, Decreased safety awareness, Decreased activity tolerance, Pain, Decreased balance, Impaired flexibility, Improper body mechanics, Decreased mobility, Decreased strength, Postural dysfunction  Visit Diagnosis: Muscle weakness (generalized)  Chronic bilateral low back pain without sciatica  Unsteadiness on feet  Abnormal posture     Problem List Patient Active Problem List   Diagnosis Date Noted   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/09/2021   Pneumonia due to COVID-19 virus 07/01/2020   Macrocytic anemia 07/01/2020   Thrombocytopenia ([place]) 07/01/2020   Acute hypoxemic respiratory failure due to COVID-19 ([place]) 07/01/2020   Cellulitis of left lower extremity 04/30/2017   Cellulitis 04/30/2017   Edema of left lower extremity 04/23/2017   AKI (acute kidney injury) ([place]) 04/23/2017   ARF (acute renal failure) ([place]) 04/22/2017   DVT of lower extremity, bilateral ([place]) 02/10/2015   PE (pulmonary embolism)    Shortness of breath    Normocytic anemia 02/07/2015   DVT (deep venous thrombosis) (HCC)    Acute pulmonary embolism ([place]) 02/06/2015   Scoliosis of thoracic spine 02/06/2015   GERD (gastroesophageal reflux disease) 02/06/2015   Acute respiratory failure with hypoxia ([place]) 02/06/2015   Acute kidney injury ([place]) 02/06/2015   Elevated troponin 02/06/2015   Ann Lions PT, DPT, PN2   Supplemental Physical Therapist [place]    Pager [phone] Acute Rehab Office [place]  [place] [place] [place], [place], [address] Phone: [phone]   Fax:  [phone]  Name: ALMEE PELPHREY MRN: [id number] Date  of Birth: 09-09-1941

## 2021-11-10 ENCOUNTER — Encounter: Payer: Self-pay | Admitting: Physical Therapy

## 2021-11-10 ENCOUNTER — Ambulatory Visit: Payer: Medicare Other | Admitting: Physical Therapy

## 2021-11-10 ENCOUNTER — Other Ambulatory Visit: Payer: Self-pay

## 2021-11-10 DIAGNOSIS — M6281 Muscle weakness (generalized): Secondary | ICD-10-CM

## 2021-11-10 DIAGNOSIS — R293 Abnormal posture: Secondary | ICD-10-CM

## 2021-11-10 DIAGNOSIS — G8929 Other chronic pain: Secondary | ICD-10-CM

## 2021-11-10 DIAGNOSIS — R2681 Unsteadiness on feet: Secondary | ICD-10-CM

## 2021-11-10 NOTE — Therapy (Signed)
[place]. [place], [place], [address] Phone: [phone]   Fax:  [phone]  Physical Therapy Treatment  Patient Details  Name: [name] May MRN: [id number] Date of Birth: [birth year] Referring Provider (PT): Alysia Penna   Encounter Date: 11/10/2021   PT End of Session - 11/10/21 1058     Visit Number 8    Number of Visits 17    Date for PT Re-Evaluation 12/05/21    Authorization Type UHC MCR    Authorization Time Period 10/10/21 to 12/05/21    Progress Note Due on Visit 10    PT Start Time 1022    PT Stop Time 1100    PT Time Calculation (min) 38 min    Activity Tolerance Patient tolerated treatment well    Behavior During Therapy [facility] for tasks assessed/performed             Past Medical History:  Diagnosis Date   Allergy    Anemia    Arthritis    left knee   Back pain    Bipolar disorder (HCC)    Chronic headaches    COPD (chronic obstructive pulmonary disease) ([place])    Degenerative disorder of bone    Depression    Esophageal ulcer    history   GERD (gastroesophageal reflux disease)    Hyperlipidemia    Peripheral vascular disease ([place])    Pulmonary embolism ([place])    Scoliosis     Past Surgical History:  Procedure Laterality Date   ABDOMINAL HYSTERECTOMY     ANKLE SURGERY     COLONOSCOPY     EYE SURGERY     caratacs   FEMORAL ARTERY EXPLORATION  05/16/2017   Procedure: STENT OF BILATERAL COMMON FEMORAL VEIN WITH 16 X 90 WALL STENT;  Surgeon: Waynetta Sandy, MD;  Location: [place];  Service: Vascular;;   hemroidectomy     INSERTION OF ILIAC STENT Bilateral 05/16/2017   Procedure: STENT OF BILATERAL COMMON AND EXTERNAL ILIAC VEIN WITH 18 X 90 WALL STENT;  Surgeon: Waynetta Sandy, MD;  Location: [place] [place];  Service: Vascular;  Laterality: Bilateral;   INTRAVASCULAR ULTRASOUND/IVUS N/A 05/06/2017   Procedure: INTRAVASCULAR ULTRASOUND/IVUS;  Surgeon: Waynetta Sandy, MD;  Location: [place];  Service: Vascular;  Laterality: N/A;   IVC FILTER INSERTION N/A 05/16/2017   Procedure: MECHANICAL THROMBECTOMY OF IVC FILTER;  Surgeon: Waynetta Sandy, MD;  Location: [facility];  Service: Vascular;  Laterality: N/A;   LAMINECTOMY     LOWER EXTREMITY VENOGRAPHY N/A 05/14/2017   Procedure: Lower Extremity Venography;  Surgeon: Serafina Mitchell, MD;  Location: [facility];  Service: Cardiovascular;  Laterality: N/A;   NASAL SINUS SURGERY     PERCUTANEOUS VENOUS THROMBECTOMY,LYSIS WITH INTRAVASCULAR ULTRASOUND (IVUS) N/A 05/16/2017   Procedure: PERCUTANEOUS VENOUS THROMBECTOMY AND LYSIS WITH INTRAVASCULAR ULTRASOUND (IVUS);  Surgeon: Waynetta Sandy, MD;  Location: [facility];  Service: Vascular;  Laterality: N/A;   VENA CAVA FILTER PLACEMENT N/A 05/16/2017   Procedure: RETRIEVAL INFERIOR VENA-CAVA FILTER;  Surgeon: Waynetta Sandy, MD;  Location: [facility];  Service: Vascular;  Laterality: N/A;   VENOGRAM Bilateral 05/16/2017   Procedure: VENOGRAM OF IVC;  Surgeon: Waynetta Sandy, MD;  Location: [facility];  Service: Vascular;  Laterality: Bilateral;    There were no vitals filed for this visit.   Subjective Assessment - 11/10/21 1037     Subjective Patient reports that her pain continues to  improve. She is performing the HEP to the best of her ability. Therapist ensured patient that she should only do as many reps as she can comfortably perform.    Pertinent History PVD, scoliosi, GERD, osteoporosis, osteopenia    How long can you stand comfortably? Painful to stand at all.    Patient Stated Goals strengthening what we can    Currently in Pain? Yes    Pain Score 3     Pain Location Back    Pain Orientation Right;Left    Pain Descriptors / Indicators Burning    Pain Type Acute pain    Pain Onset More than a month ago    Pain Frequency Constant                               [facility] Adult PT Treatment/Exercise -  11/10/21 0001       Lumbar Exercises: Aerobic   Nustep L4 x 6 minutes legs only      Lumbar Exercises: Standing   Row Strengthening;Both;10 reps    Theraband Level (Row) Level 1 (Yellow)    Shoulder Extension Strengthening;Both;10 reps;Theraband    Theraband Level (Shoulder Extension) Level 2 (Red)    Other Standing Lumbar Exercises B shoulder ER against yellow Tband resistance, 10 reps    Other Standing Lumbar Exercises standing on AirEx pad-Perform forward and Back weight shifts, B weight shifts, then marching and hip abd.x 10 reps each leg, with UUE support on post and CGA, mildly unstable.      Lumbar Exercises: Seated   Other Seated Lumbar Exercises Seated scapular retraction iwth 5 second holds, B hands onmat behind trunk                     PT Education - 11/10/21 1051     Education Details Updated HEP for trunk stability. Educated to cautiously perform, no paIn.    Person(s) Educated Patient    Methods Explanation;Demonstration;Handout    Comprehension Verbalized understanding;Returned demonstration              PT Short Term Goals - 11/10/21 1102       PT SHORT TERM GOAL #1   Title Will be compliant with appropriate HEP to be updated PRN    Status Achieved      PT SHORT TERM GOAL #2   Title Will consistently and safely use appropriate LRAD at home and in community in order to reduce fall risk/improve community access    Baseline She has a rollator which she uses for longer excursions outside of the house.    Status Achieved      PT SHORT TERM GOAL #3   Title Will report pain as being no more than 2/10 at worst in order to improve QOL and functional task tolerance    Baseline 3/10    Time 1    Period Weeks    Status On-going    Target Date 11/17/21      PT SHORT TERM GOAL #4   Title Will be able to complete bed mobility and functional lifting with good mechanics in order to protect back and reduce further risk of additional compression fractures     Time 1    Period Weeks    Status On-going    Target Date 11/17/21               PT Long Term Goals - 10/10/21 1754  PT LONG TERM GOAL #1   Title MMT will improve by at least one grade in core and all other weak groups in order to reduce pain/improve function    Time 8    Period Weeks    Status New    Target Date 12/05/21      PT LONG TERM GOAL #2   Title Will score at least 48 on the Berg in order to show reduced fall risk    Time 8    Period Weeks    Status New      PT LONG TERM GOAL #3   Title Will be able to ambulate [facility] distances [facility] and no unsteadiness in order to improve community access with LRAD    Time 8    Period Weeks    Status New      PT LONG TERM GOAL #4   Title Will be compliant with appropriate gym based program such as silver sneakers in order to maintain functional gains and reduce risk of backslide of physical gains from PT    Time 8    Period Weeks    Status New                   Plan - 11/10/21 1101     Clinical Impression Statement Patient reports continuing improvement. She participated well. Added postural strengthening exercises, but cautioned her to maintainvery minimal resistance.    Personal Factors and Comorbidities Age;Behavior Pattern;Social Background;Fitness;Time since onset of injury/illness/exacerbation;Past/Current Experience    Examination-Activity Limitations Squat;Bed Mobility;Lift;Stairs;Bend;Locomotion Level;Stand;Reach Overhead;Carry;Transfers;Sit;Sleep    Examination-Participation Restrictions Church;Cleaning;Shop;Community Activity;Volunteer;Meal Prep;Yard Work    Biomedical scientist Low    Rehab Potential Good    PT Frequency 2x / week    PT Duration 8 weeks    PT Treatment/Interventions ADLs/Self Care Home Management;Cryotherapy;Electrical Stimulation;Iontophoresis 4mg /ml Dexamethasone;Moist Heat;Ultrasound;DME  Instruction;Gait training;Stair training;Functional mobility training;Therapeutic activities;Therapeutic exercise;Balance training;Neuromuscular re-education;Patient/family education;Manual techniques;Passive range of motion;Dry needling;Energy conservation    PT Next Visit Plan back precautions for comfort given multiple compression fxs; general strengthening, endurance, balance training program assess [place]rance to HEP    PT Home Exercise Plan Access Code: [email or web address]  URL: [email or web address]  Date: 10/20/2021  Prepared by: Ethel Rana    Exercises  Seated Long Arc Quad - 1 x daily - 7 x weekly - 1 sets - 10 reps  Seated Hip Abduction - 1 x daily - 7 x weekly - 1 sets - 10 reps  Seated Heel Toe Raises - 1 x daily - 7 x weekly - 1 sets - 10 reps    Consulted and Agree with Plan of Care Patient             Patient will benefit from skilled therapeutic intervention in order to improve the following deficits and impairments:  Abnormal gait, Decreased coordination, Difficulty walking, Cardiopulmonary status limiting activity, Decreased endurance, Decreased safety awareness, Decreased activity tolerance, Pain, Decreased balance, Impaired flexibility, Improper body mechanics, Decreased mobility, Decreased strength, Postural dysfunction  Visit Diagnosis: Muscle weakness (generalized)  Chronic bilateral low back pain without sciatica  Unsteadiness on feet  Abnormal posture     Problem List Patient Active Problem List   Diagnosis Date Noted   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/09/2021   Pneumonia due to COVID-19 virus 07/01/2020   Macrocytic anemia 07/01/2020   Thrombocytopenia ([place]) 07/01/2020   Acute hypoxemic respiratory failure due to COVID-19 ([place]) 07/01/2020   Cellulitis of left lower extremity  04/30/2017   Cellulitis 04/30/2017   Edema of left lower extremity 04/23/2017   AKI (acute kidney injury) ([place]) 04/23/2017   ARF (acute renal failure) ([place])  04/22/2017   DVT of lower extremity, bilateral ([place]) 02/10/2015   PE (pulmonary embolism)    Shortness of breath    Normocytic anemia 02/07/2015   DVT (deep venous thrombosis) (HCC)    Acute pulmonary embolism ([place]) 02/06/2015   Scoliosis of thoracic spine 02/06/2015   GERD (gastroesophageal reflux disease) 02/06/2015   Acute respiratory failure with hypoxia ([place]) 02/06/2015   Acute kidney injury ([place]) 02/06/2015   Elevated troponin 02/06/2015    Marcelina Morel, DPT 11/10/2021, 11:07 AM  [place]. [place], [place], [address] Phone: [phone]   Fax:  [phone]  Name: [name] May MRN: [id number] Date of Birth: 05-22-1941

## 2021-11-10 NOTE — Patient Instructions (Signed)
Access Code: [email or web address] URL: [email or web address] Date: 11/10/2021 Prepared by: Ethel Rana  Exercises Shoulder Extension with Resistance - 1 x daily - 7 x weekly - 1 sets - 10 reps Standing Shoulder Row with Anchored Resistance - 1 x daily - 7 x weekly - 1 sets - 10 reps Shoulder External Rotation and Scapular Retraction with Resistance - 1 x daily - 7 x weekly - 1 sets - 10 reps

## 2021-11-14 ENCOUNTER — Ambulatory Visit: Payer: Medicare Other | Admitting: Physical Therapy

## 2021-11-15 ENCOUNTER — Telehealth: Payer: Self-pay | Admitting: *Deleted

## 2021-11-15 NOTE — Telephone Encounter (Addendum)
-----   Message from [name] Blake, MD sent at 11/15/2021  6:58 AM EST ----- Please inform pt of new Right 11th rib fx , no change in therapy   [name] notified.

## 2021-11-17 ENCOUNTER — Other Ambulatory Visit: Payer: Self-pay

## 2021-11-17 ENCOUNTER — Ambulatory Visit: Payer: Medicare Other | Attending: Physical Medicine & Rehabilitation | Admitting: Physical Therapy

## 2021-11-17 ENCOUNTER — Encounter: Payer: Self-pay | Admitting: Physical Therapy

## 2021-11-17 DIAGNOSIS — R2681 Unsteadiness on feet: Secondary | ICD-10-CM | POA: Insufficient documentation

## 2021-11-17 DIAGNOSIS — R293 Abnormal posture: Secondary | ICD-10-CM | POA: Insufficient documentation

## 2021-11-17 DIAGNOSIS — M545 Low back pain, unspecified: Secondary | ICD-10-CM | POA: Diagnosis present

## 2021-11-17 DIAGNOSIS — G8929 Other chronic pain: Secondary | ICD-10-CM | POA: Insufficient documentation

## 2021-11-17 DIAGNOSIS — M6281 Muscle weakness (generalized): Secondary | ICD-10-CM | POA: Insufficient documentation

## 2021-11-17 NOTE — Therapy (Signed)
[place]. [place], [place], [address] Phone: [phone]   Fax:  [phone]  Physical Therapy Treatment  Patient Details  Name: [name] May MRN: [id number] Date of Birth: 24-Feb-1941 Referring Provider (PT): Alysia Penna   Encounter Date: 11/17/2021   PT End of Session - 11/17/21 0955     Visit Number 9    Number of Visits 17    Date for PT Re-Evaluation 12/05/21    Authorization Type UHC MCR    Authorization Time Period 10/10/21 to 12/05/21    Progress Note Due on Visit 10    PT Start Time 0933    PT Stop Time 1011    PT Time Calculation (min) 38 min    Activity Tolerance Patient tolerated treatment well    Behavior During Therapy [facility] for tasks assessed/performed             Past Medical History:  Diagnosis Date   Allergy    Anemia    Arthritis    left knee   Back pain    Bipolar disorder (HCC)    Chronic headaches    COPD (chronic obstructive pulmonary disease) ([place])    Degenerative disorder of bone    Depression    Esophageal ulcer    history   GERD (gastroesophageal reflux disease)    Hyperlipidemia    Peripheral vascular disease ([place])    Pulmonary embolism ([place])    Scoliosis     Past Surgical History:  Procedure Laterality Date   ABDOMINAL HYSTERECTOMY     ANKLE SURGERY     COLONOSCOPY     EYE SURGERY     caratacs   FEMORAL ARTERY EXPLORATION  05/16/2017   Procedure: STENT OF BILATERAL COMMON FEMORAL VEIN WITH 16 X 90 WALL STENT;  Surgeon: Waynetta Sandy, MD;  Location: [facility];  Service: Vascular;;   hemroidectomy     INSERTION OF ILIAC STENT Bilateral 05/16/2017   Procedure: STENT OF BILATERAL COMMON AND EXTERNAL ILIAC VEIN WITH 18 X 90 WALL STENT;  Surgeon: Waynetta Sandy, MD;  Location: [facility];  Service: Vascular;  Laterality: Bilateral;   INTRAVASCULAR ULTRASOUND/IVUS N/A 05/06/2017   Procedure: INTRAVASCULAR ULTRASOUND/IVUS;  Surgeon: Waynetta Sandy, MD;  Location: [facility];  Service: Vascular;  Laterality: N/A;   IVC FILTER INSERTION N/A 05/16/2017   Procedure: MECHANICAL THROMBECTOMY OF IVC FILTER;  Surgeon: Waynetta Sandy, MD;  Location: [facility];  Service: Vascular;  Laterality: N/A;   LAMINECTOMY     LOWER EXTREMITY VENOGRAPHY N/A 05/14/2017   Procedure: Lower Extremity Venography;  Surgeon: Serafina Mitchell, MD;  Location: [facility];  Service: Cardiovascular;  Laterality: N/A;   NASAL SINUS SURGERY     PERCUTANEOUS VENOUS THROMBECTOMY,LYSIS WITH INTRAVASCULAR ULTRASOUND (IVUS) N/A 05/16/2017   Procedure: PERCUTANEOUS VENOUS THROMBECTOMY AND LYSIS WITH INTRAVASCULAR ULTRASOUND (IVUS);  Surgeon: Waynetta Sandy, MD;  Location: [facility];  Service: Vascular;  Laterality: N/A;   VENA CAVA FILTER PLACEMENT N/A 05/16/2017   Procedure: RETRIEVAL INFERIOR VENA-CAVA FILTER;  Surgeon: Waynetta Sandy, MD;  Location: [facility];  Service: Vascular;  Laterality: N/A;   VENOGRAM Bilateral 05/16/2017   Procedure: VENOGRAM OF IVC;  Surgeon: Waynetta Sandy, MD;  Location: [facility];  Service: Vascular;  Laterality: Bilateral;    There were no vitals filed for this visit.   Subjective Assessment - 11/17/21 0945     Subjective Patient missed last appointment due to diarrhea.  [place]ver, she also lost her dog on Tuesday night and is devastated. Her physical pain continues to improve.    Pertinent History PVD, scoliosi, GERD, osteoporosis, osteopenia    How long can you stand comfortably? Painful to stand at all.    Patient Stated Goals strengthening what we can    Currently in Pain? Yes    Pain Score 2     Pain Location Rib cage    Pain Orientation Left    Pain Descriptors / Indicators Aching    Pain Type Acute pain    Pain Onset More than a month ago    Pain Frequency Constant                               [facility] Adult PT Treatment/Exercise - 11/17/21 0001       Lumbar Exercises: Aerobic    Nustep L4 x 6 minutes legs only      Lumbar Exercises: Seated   Other Seated Lumbar Exercises On Physiodisc. Performed pelvic mobility including ant/post weight shifts, B shifts and clocks 2 x in each direction.      Knee/Hip Exercises: Seated   Clamshell with TheraBand Red    Other Seated Knee/Hip Exercises Hip stretches for gluts  F/B 2 x 10 clamshells      Manual Therapy   Manual Therapy Myofascial release    Myofascial Release ITB release on L                     PT Education - 11/17/21 0951     Education Details Encouraged her to get back to her HEP after a bad week of interuptions.    Person(s) Educated Patient    Methods Explanation    Comprehension Verbalized understanding              PT Short Term Goals - 11/10/21 1102       PT SHORT TERM GOAL #1   Title Will be compliant with appropriate HEP to be updated PRN    Status Achieved      PT SHORT TERM GOAL #2   Title Will consistently and safely use appropriate LRAD at home and in community in order to reduce fall risk/improve community access    Baseline She has a rollator which she uses for longer excursions outside of the house.    Status Achieved      PT SHORT TERM GOAL #3   Title Will report pain as being no more than 2/10 at worst in order to improve QOL and functional task tolerance    Baseline 3/10    Time 1    Period Weeks    Status On-going    Target Date 11/17/21      PT SHORT TERM GOAL #4   Title Will be able to complete bed mobility and functional lifting with good mechanics in order to protect back and reduce further risk of additional compression fractures    Time 1    Period Weeks    Status On-going    Target Date 11/17/21               PT Long Term Goals - 10/10/21 1754       PT LONG TERM GOAL #1   Title MMT will improve by at least one grade in core and all other weak groups in order to reduce pain/improve function    Time 8  Period Weeks    Status New     Target Date 12/05/21      PT LONG TERM GOAL #2   Title Will score at least 48 on the Berg in order to show reduced fall risk    Time 8    Period Weeks    Status New      PT LONG TERM GOAL #3   Title Will be able to ambulate community distances with [facility] and no unsteadiness in order to improve community access with LRAD    Time 8    Period Weeks    Status New      PT LONG TERM GOAL #4   Title Will be compliant with appropriate gym based program such as silver sneakers in order to maintain functional gains and reduce risk of backslide of physical gains from PT    Time [place] - 11/17/21 0956     Clinical Impression Statement Patient has had a difficult week both emotionally after losing her dog on Tues and also having a bout of diarrhea. She arrived ready to get moving again. Therapist encouraged return to HEP, led patien through gentle, but full treatment. She participated well and reported feeling better after acctivities.    Personal Factors and Comorbidities Age;Behavior Pattern;Social Background;Fitness;Time since onset of injury/illness/exacerbation;Past/Current Experience    Examination-Activity Limitations Squat;Bed Mobility;Lift;Stairs;Bend;Locomotion Level;Stand;Reach Overhead;Carry;Transfers;Sit;Sleep    Examination-Participation Restrictions Church;Cleaning;Shop;Community Activity;Volunteer;Meal Prep;Yard Work    Biomedical scientist Low    Rehab Potential Good    PT Frequency 2x / week    PT Duration 8 weeks    PT Treatment/Interventions ADLs/Self Care Home Management;Cryotherapy;Electrical Stimulation;Iontophoresis 4mg /ml Dexamethasone;Moist Heat;Ultrasound;DME Instruction;Gait training;Stair training;Functional mobility training;Therapeutic activities;Therapeutic exercise;Balance training;Neuromuscular re-education;Patient/family education;Manual  techniques;Passive range of motion;Dry needling;Energy conservation    PT Next Visit Plan back precautions for comfort given multiple compression fxs; general strengthening, endurance, balance training program assess toelrance to HEP    PT Home Exercise Plan Access Code: [email or web address]  URL: [email or web address]  Date: 10/20/2021  Prepared by: Ethel Rana    Exercises  Seated Long Arc Quad - 1 x daily - 7 x weekly - 1 sets - 10 reps  Seated Hip Abduction - 1 x daily - 7 x weekly - 1 sets - 10 reps  Seated Heel Toe Raises - 1 x daily - 7 x weekly - 1 sets - 10 reps    Consulted and Agree with Plan of Care Patient             Patient will benefit from skilled therapeutic intervention in order to improve the following deficits and impairments:  Abnormal gait, Decreased coordination, Difficulty walking, Cardiopulmonary status limiting activity, Decreased endurance, Decreased safety awareness, Decreased activity tolerance, Pain, Decreased balance, Impaired flexibility, Improper body mechanics, Decreased mobility, Decreased strength, Postural dysfunction  Visit Diagnosis: Muscle weakness (generalized)  Chronic bilateral low back pain without sciatica  Unsteadiness on feet  Abnormal posture     Problem List Patient Active Problem List   Diagnosis Date Noted   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/09/2021   Pneumonia due to COVID-19 virus 07/01/2020   Macrocytic anemia 07/01/2020   Thrombocytopenia ([place]) 07/01/2020   Acute hypoxemic respiratory failure due to COVID-19 ([place]) 07/01/2020   Cellulitis of left lower extremity 04/30/2017   Cellulitis 04/30/2017  Edema of left lower extremity 04/23/2017   AKI (acute kidney injury) ([place]) 04/23/2017   ARF (acute renal failure) ([place]) 04/22/2017   DVT of lower extremity, bilateral ([place]) 02/10/2015   PE (pulmonary embolism)    Shortness of breath    Normocytic anemia 02/07/2015   DVT (deep venous thrombosis) (HCC)    Acute  pulmonary embolism ([place]) 02/06/2015   Scoliosis of thoracic spine 02/06/2015   GERD (gastroesophageal reflux disease) 02/06/2015   Acute respiratory failure with hypoxia ([place]) 02/06/2015   Acute kidney injury ([place]) 02/06/2015   Elevated troponin 02/06/2015    Marcelina Morel, DPT 11/17/2021, 11:51 AM  [place]. [place], [place], [address] Phone: [phone]   Fax:  [phone]  Name: [name] May MRN: [id number] Date of Birth: 1940-11-23

## 2021-11-21 ENCOUNTER — Ambulatory Visit: Payer: Medicare Other | Admitting: Physical Therapy

## 2021-11-21 ENCOUNTER — Encounter: Payer: Self-pay | Admitting: Physical Therapy

## 2021-11-21 ENCOUNTER — Other Ambulatory Visit: Payer: Self-pay

## 2021-11-21 DIAGNOSIS — R2681 Unsteadiness on feet: Secondary | ICD-10-CM

## 2021-11-21 DIAGNOSIS — M6281 Muscle weakness (generalized): Secondary | ICD-10-CM | POA: Diagnosis not present

## 2021-11-21 DIAGNOSIS — R293 Abnormal posture: Secondary | ICD-10-CM

## 2021-11-21 DIAGNOSIS — G8929 Other chronic pain: Secondary | ICD-10-CM

## 2021-11-21 NOTE — Therapy (Signed)
[place]. [place], [place], [address] Phone: [phone]   Fax:  [phone]  Physical Therapy Treatment Progress Note Reporting Period 10/10/21 to 11/21/21  See note below for Objective Data and Assessment of Progress/Goals.     Patient Details  Name: [name] May MRN: [id number] Date of Birth: 1941/07/30 Referring Provider (PT): Alysia Penna   Encounter Date: 11/21/2021   PT End of Session - 11/21/21 1148     Visit Number 10    Number of Visits 17    Date for PT Re-Evaluation 12/05/21    Authorization Type UHC MCR    Authorization Time Period 10/10/21 to 12/05/21    Progress Note Due on Visit 10    PT Start Time 1100    PT Stop Time 1143    PT Time Calculation (min) 43 min    Activity Tolerance Patient tolerated treatment well    Behavior During Therapy [facility] for tasks assessed/performed             Past Medical History:  Diagnosis Date   Allergy    Anemia    Arthritis    left knee   Back pain    Bipolar disorder (HCC)    Chronic headaches    COPD (chronic obstructive pulmonary disease) ([place])    Degenerative disorder of bone    Depression    Esophageal ulcer    history   GERD (gastroesophageal reflux disease)    Hyperlipidemia    Peripheral vascular disease ([place])    Pulmonary embolism ([place])    Scoliosis     Past Surgical History:  Procedure Laterality Date   ABDOMINAL HYSTERECTOMY     ANKLE SURGERY     COLONOSCOPY     EYE SURGERY     caratacs   FEMORAL ARTERY EXPLORATION  05/16/2017   Procedure: STENT OF BILATERAL COMMON FEMORAL VEIN WITH 16 X 90 WALL STENT;  Surgeon: Waynetta Sandy, MD;  Location: [facility];  Service: Vascular;;   hemroidectomy     INSERTION OF ILIAC STENT Bilateral 05/16/2017   Procedure: STENT OF BILATERAL COMMON AND EXTERNAL ILIAC VEIN WITH 18 X 90 WALL STENT;  Surgeon: Waynetta Sandy, MD;  Location: [facility];  Service: Vascular;  Laterality:  Bilateral;   INTRAVASCULAR ULTRASOUND/IVUS N/A 05/06/2017   Procedure: INTRAVASCULAR ULTRASOUND/IVUS;  Surgeon: Waynetta Sandy, MD;  Location: [facility];  Service: Vascular;  Laterality: N/A;   IVC FILTER INSERTION N/A 05/16/2017   Procedure: MECHANICAL THROMBECTOMY OF IVC FILTER;  Surgeon: Waynetta Sandy, MD;  Location: [facility];  Service: Vascular;  Laterality: N/A;   LAMINECTOMY     LOWER EXTREMITY VENOGRAPHY N/A 05/14/2017   Procedure: Lower Extremity Venography;  Surgeon: Serafina Mitchell, MD;  Location: [facility];  Service: Cardiovascular;  Laterality: N/A;   NASAL SINUS SURGERY     PERCUTANEOUS VENOUS THROMBECTOMY,LYSIS WITH INTRAVASCULAR ULTRASOUND (IVUS) N/A 05/16/2017   Procedure: PERCUTANEOUS VENOUS THROMBECTOMY AND LYSIS WITH INTRAVASCULAR ULTRASOUND (IVUS);  Surgeon: Waynetta Sandy, MD;  Location: [facility];  Service: Vascular;  Laterality: N/A;   VENA CAVA FILTER PLACEMENT N/A 05/16/2017   Procedure: RETRIEVAL INFERIOR VENA-CAVA FILTER;  Surgeon: Waynetta Sandy, MD;  Location: [facility];  Service: Vascular;  Laterality: N/A;   VENOGRAM Bilateral 05/16/2017   Procedure: VENOGRAM OF IVC;  Surgeon: Waynetta Sandy, MD;  Location: [facility];  Service: Vascular;  Laterality: Bilateral;    There were no vitals filed for  this visit.   Subjective Assessment - 11/21/21 1103     Subjective Patient reports no problems today.    Pertinent History PVD, scoliosi, GERD, osteoporosis, osteopenia    How long can you stand comfortably? Painful to stand at all.    Patient Stated Goals strengthening what we can    Currently in Pain? Yes    Pain Score 2     Pain Location Back    Pain Orientation Mid;Posterior    Pain Descriptors / Indicators Aching    Pain Type Chronic pain    Pain Onset More than a month ago    Pain Frequency Constant                [facility] PT Assessment - 11/21/21 0001       Strength   Right Hip Flexion 4-/5    Left Hip Flexion  3+/5    Right Knee Flexion 4+/5    Right Knee Extension 4+/5    Left Knee Flexion 4+/5    Left Knee Extension 4+/5      Berg Balance Test   Sit to Stand Able to stand  independently using hands    Standing Unsupported Able to stand safely 2 minutes    Sitting with Back Unsupported but Feet Supported on Floor or Stool Able to sit safely and securely 2 minutes    Stand to Sit Controls descent by using hands    Transfers Able to transfer safely, definite need of hands    Standing Unsupported with Eyes Closed Able to stand 10 seconds with supervision    Standing Unsupported with Feet Together Able to place feet together independently and stand 1 minute safely    From Standing, Reach Forward with Outstretched Arm Can reach forward >12 cm safely (5")    From Standing Position, Pick up Object from Floor Able to pick up shoe, needs supervision    From Standing Position, Turn to Look Behind Over each Shoulder Looks behind from both sides and weight shifts well    Turn 360 Degrees Able to turn 360 degrees safely one side only in 4 seconds or less    Standing Unsupported, Alternately Place Feet on Step/Stool Able to stand independently and complete 8 steps >20 seconds    Standing Unsupported, One Foot in Front Able to plae foot ahead of the other independently and hold 30 seconds    Standing on One Leg Able to lift leg independently and hold > 10 seconds    Total Score 47    Berg comment: Moderate fall risk                           [facility] Adult PT Treatment/Exercise - 11/21/21 0001       Lumbar Exercises: Aerobic   Nustep L4 x 6 minutes legs only      Lumbar Exercises: Seated   Other Seated Lumbar Exercises Seated scapular retractions with hands on mat behind. 5 x 5 seconds                     PT Education - 11/21/21 1147     Education Details Educated to her current status which is progressing toward LTG.    Person(s) Educated Patient    Methods Explanation     Comprehension Verbalized understanding              PT Short Term Goals - 11/21/21 1110  PT SHORT TERM GOAL #3   Title Will report pain as being no more than 2/10 at worst in order to improve QOL and functional task tolerance    Baseline 2/10    Status Achieved               PT Long Term Goals - 11/21/21 1129       PT LONG TERM GOAL #1   Title MMT will improve by at least one grade in core and all other weak groups in order to reduce pain/improve function    Baseline LLE 4-/5, RLE 4/5    Time 4    Period Weeks    Status On-going    Target Date 12/05/21      PT LONG TERM GOAL #2   Title Will score at least 48 on the Berg in order to show reduced fall risk    Baseline 47/56    Time 4    Period Weeks    Status On-going    Target Date 12/19/21      PT LONG TERM GOAL #3   Title Will be able to ambulate community distances with [facility] and no unsteadiness in order to improve community access with LRAD    Baseline She does minimal community walking, but limited in distance    Time 4    Period Weeks    Status On-going    Target Date 12/19/21      PT LONG TERM GOAL #4   Title Will be compliant with appropriate gym based program such as silver sneakers in order to maintain functional gains and reduce risk of backslide of physical gains from PT    Time 8    Period Weeks    Status New                   Plan - 11/21/21 1148     Clinical Impression Statement Patient's functional status was re-assessed today for PN. She is demosntrating decreased pain, increased strength, and good progress toward LTG.    Personal Factors and Comorbidities Age;Behavior Pattern;Social Background;Fitness;Time since onset of injury/illness/exacerbation;Past/Current Experience    Examination-Activity Limitations Squat;Bed Mobility;Lift;Stairs;Bend;Locomotion Level;Stand;Reach Overhead;Carry;Transfers;Sit;Sleep    Examination-Participation Restrictions  Church;Cleaning;Shop;Community Activity;Volunteer;Meal Prep;Yard Work    Biomedical scientist Low    Rehab Potential Good    PT Frequency 2x / week    PT Duration 4 weeks    PT Treatment/Interventions ADLs/Self Care Home Management;Cryotherapy;Electrical Stimulation;Iontophoresis 4mg /ml Dexamethasone;Moist Heat;Ultrasound;DME Instruction;Gait training;Stair training;Functional mobility training;Therapeutic activities;Therapeutic exercise;Balance training;Neuromuscular re-education;Patient/family education;Manual techniques;Passive range of motion;Dry needling;Energy conservation    PT Next Visit Plan back precautions for comfort given multiple compression fxs; general strengthening, endurance, balance training program assess [place]rance to HEP    PT Home Exercise Plan Access Code: [email or web address]  URL: [email or web address]  Date: 10/20/2021  Prepared by: Ethel Rana    Exercises  Seated Long Arc Quad - 1 x daily - 7 x weekly - 1 sets - 10 reps  Seated Hip Abduction - 1 x daily - 7 x weekly - 1 sets - 10 reps  Seated Heel Toe Raises - 1 x daily - 7 x weekly - 1 sets - 10 reps    Consulted and Agree with Plan of Care Patient             Patient will benefit from skilled therapeutic intervention in order to improve the following deficits and impairments:  Abnormal gait, Decreased coordination, Difficulty walking,  Cardiopulmonary status limiting activity, Decreased endurance, Decreased safety awareness, Decreased activity tolerance, Pain, Decreased balance, Impaired flexibility, Improper body mechanics, Decreased mobility, Decreased strength, Postural dysfunction  Visit Diagnosis: Muscle weakness (generalized)  Chronic bilateral low back pain without sciatica  Unsteadiness on feet  Abnormal posture     Problem List Patient Active Problem List   Diagnosis Date Noted   Lumbosacral spondylosis without myelopathy  06/09/2021   Pneumonia due to COVID-19 virus 07/01/2020   Macrocytic anemia 07/01/2020   Thrombocytopenia ([place]) 07/01/2020   Acute hypoxemic respiratory failure due to COVID-19 ([place]) 07/01/2020   Cellulitis of left lower extremity 04/30/2017   Cellulitis 04/30/2017   Edema of left lower extremity 04/23/2017   AKI (acute kidney injury) ([place]) 04/23/2017   ARF (acute renal failure) ([place]) 04/22/2017   DVT of lower extremity, bilateral ([place]) 02/10/2015   PE (pulmonary embolism)    Shortness of breath    Normocytic anemia 02/07/2015   DVT (deep venous thrombosis) (HCC)    Acute pulmonary embolism ([place]) 02/06/2015   Scoliosis of thoracic spine 02/06/2015   GERD (gastroesophageal reflux disease) 02/06/2015   Acute respiratory failure with hypoxia ([place]) 02/06/2015   Acute kidney injury ([place]) 02/06/2015   Elevated troponin 02/06/2015    Marcelina Morel, DPT 11/21/2021, 11:50 AM  [place]. [place], [place], [address] Phone: [phone]   Fax:  [phone]  Name: IVADELL GAUL MRN: [id number] Date of Birth: 06-23-1941

## 2021-11-24 ENCOUNTER — Encounter: Payer: Self-pay | Admitting: Physical Therapy

## 2021-11-24 ENCOUNTER — Ambulatory Visit: Payer: Medicare Other | Admitting: Physical Therapy

## 2021-11-24 ENCOUNTER — Other Ambulatory Visit: Payer: Self-pay

## 2021-11-24 DIAGNOSIS — R293 Abnormal posture: Secondary | ICD-10-CM

## 2021-11-24 DIAGNOSIS — R2681 Unsteadiness on feet: Secondary | ICD-10-CM

## 2021-11-24 DIAGNOSIS — G8929 Other chronic pain: Secondary | ICD-10-CM

## 2021-11-24 DIAGNOSIS — M6281 Muscle weakness (generalized): Secondary | ICD-10-CM | POA: Diagnosis not present

## 2021-11-24 NOTE — Therapy (Signed)
[address] [place], [place], [address] Phone: [phone]   Fax:  [phone]  Physical Therapy Treatment  Patient Details  Name: [name] May MRN: [id number] Date of Birth: Oct 05, 1941 Referring Provider (PT): Alysia Penna   Encounter Date: 11/24/2021   PT End of Session - 11/24/21 1054     Visit Number 11    Number of Visits 17    Date for PT Re-Evaluation 12/05/21    Authorization Type UHC MCR    Authorization Time Period 10/10/21 to 12/05/21    Progress Note Due on Visit 10    PT Start Time 1022    PT Stop Time 1100    PT Time Calculation (min) 38 min    Activity Tolerance Patient tolerated treatment well    Behavior During Therapy WFL for tasks assessed/performed             Past Medical History:  Diagnosis Date   Allergy    Anemia    Arthritis    left knee   Back pain    Bipolar disorder (HCC)    Chronic headaches    COPD (chronic obstructive pulmonary disease) ([place])    Degenerative disorder of bone    Depression    Esophageal ulcer    history   GERD (gastroesophageal reflux disease)    Hyperlipidemia    Peripheral vascular disease ([place])    Pulmonary embolism ([place])    Scoliosis     Past Surgical History:  Procedure Laterality Date   ABDOMINAL HYSTERECTOMY     ANKLE SURGERY     COLONOSCOPY     EYE SURGERY     caratacs   FEMORAL ARTERY EXPLORATION  05/16/2017   Procedure: STENT OF BILATERAL COMMON FEMORAL VEIN WITH 16 X 90 WALL STENT;  Surgeon: Waynetta Sandy, MD;  Location: [facility];  Service: Vascular;;   hemroidectomy     INSERTION OF ILIAC STENT Bilateral 05/16/2017   Procedure: STENT OF BILATERAL COMMON AND EXTERNAL ILIAC VEIN WITH 18 X 90 WALL STENT;  Surgeon: Waynetta Sandy, MD;  Location: [facility];  Service: Vascular;  Laterality: Bilateral;   INTRAVASCULAR ULTRASOUND/IVUS N/A 05/06/2017   Procedure: INTRAVASCULAR ULTRASOUND/IVUS;  Surgeon: Waynetta Sandy, MD;  Location: [facility];  Service: Vascular;  Laterality: N/A;   IVC FILTER INSERTION N/A 05/16/2017   Procedure: MECHANICAL THROMBECTOMY OF IVC FILTER;  Surgeon: Waynetta Sandy, MD;  Location: [facility];  Service: Vascular;  Laterality: N/A;   LAMINECTOMY     LOWER EXTREMITY VENOGRAPHY N/A 05/14/2017   Procedure: Lower Extremity Venography;  Surgeon: Serafina Mitchell, MD;  Location: [facility];  Service: Cardiovascular;  Laterality: N/A;   NASAL SINUS SURGERY     PERCUTANEOUS VENOUS THROMBECTOMY,LYSIS WITH INTRAVASCULAR ULTRASOUND (IVUS) N/A 05/16/2017   Procedure: PERCUTANEOUS VENOUS THROMBECTOMY AND LYSIS WITH INTRAVASCULAR ULTRASOUND (IVUS);  Surgeon: Waynetta Sandy, MD;  Location: [facility];  Service: Vascular;  Laterality: N/A;   VENA CAVA FILTER PLACEMENT N/A 05/16/2017   Procedure: RETRIEVAL INFERIOR VENA-CAVA FILTER;  Surgeon: Waynetta Sandy, MD;  Location: [facility];  Service: Vascular;  Laterality: N/A;   VENOGRAM Bilateral 05/16/2017   Procedure: VENOGRAM OF IVC;  Surgeon: Waynetta Sandy, MD;  Location: [facility];  Service: Vascular;  Laterality: Bilateral;    There were no vitals filed for this visit.   Subjective Assessment - 11/24/21 1029     Subjective Patient reports continuing imporvement in her L  ribs. She has been inconsistent with performing HEP due to her dog passing.    Currently in Pain? Yes    Pain Score 2     Pain Location Back    Pain Orientation Left;Posterior    Pain Descriptors / Indicators Aching    Pain Type Chronic pain    Pain Onset More than a month ago    Pain Frequency Constant                               [facility] Adult PT Treatment/Exercise - 11/24/21 0001       Lumbar Exercises: Aerobic   Nustep L4 x 6 minutes legs only      Lumbar Exercises: Standing   Shoulder Extension Strengthening;Both;5 reps;Other (comment)   hold x 5 sec   Other Standing Lumbar Exercises Mini squats while  holding 1# ball in BUE, Therapist facilitated upright posture. 10 reps    Other Standing Lumbar Exercises Standing with back to walll, push elbows into wall x 10, then repeated with hands.      Lumbar Exercises: Seated   Other Seated Lumbar Exercises Hold 1# ball at chest level, reach out and back while holding upright posture x 8 reps, fatigued quickly and had to stop.                     PT Education - 11/24/21 1044     Education Details Discussed maintaining upright posture and lifting techniques. Patient is able to verbalize safe compnesations for bed mobility and lifting.    Person(s) Educated Patient    Methods Explanation;Demonstration    Comprehension Returned demonstration;Verbalized understanding              PT Short Term Goals - 11/24/21 1043       PT SHORT TERM GOAL #4   Title Will be able to complete bed mobility and functional lifting with good mechanics in order to protect back and reduce further risk of additional compression fractures    Status Achieved               PT Long Term Goals - 11/21/21 1129       PT LONG TERM GOAL #1   Title MMT will improve by at least one grade in core and all other weak groups in order to reduce pain/improve function    Baseline LLE 4-/5, RLE 4/5    Time 4    Period Weeks    Status On-going    Target Date 12/05/21      PT LONG TERM GOAL #2   Title Will score at least 48 on the Berg in order to show reduced fall risk    Baseline 47/56    Time 4    Period Weeks    Status On-going    Target Date 12/19/21      PT LONG TERM GOAL #3   Title Will be able to ambulate community distances with [facility] and no unsteadiness in order to improve community access with LRAD    Baseline She does minimal community walking, but limited in distance    Time 4    Period Weeks    Status On-going    Target Date 12/19/21      PT LONG TERM GOAL #4   Title Will be compliant with appropriate gym based program such as silver  sneakers in order to maintain functional gains and reduce risk  of backslide of physical gains from PT    Time 8    Period Weeks    Status New                   Plan - 11/24/21 1056     Clinical Impression Statement Patient participated well, performing trunk stabilization exercises. Trying to replace her T band exercises as she does note have a rail at home.    Personal Factors and Comorbidities Age;Behavior Pattern;Social Background;Fitness;Time since onset of injury/illness/exacerbation;Past/Current Experience    Examination-Activity Limitations Squat;Bed Mobility;Lift;Stairs;Bend;Locomotion Level;Stand;Reach Overhead;Carry;Transfers;Sit;Sleep    Examination-Participation Restrictions Church;Cleaning;Shop;Community Activity;Volunteer;Meal Prep;Yard Work    Merchant navy officer Evolving/Moderate complexity    Rehab Potential Good    PT Frequency 2x / week    PT Duration 4 weeks    PT Treatment/Interventions ADLs/Self Care Home Management;Cryotherapy;Electrical Stimulation;Iontophoresis 4mg /ml Dexamethasone;Moist Heat;Ultrasound;DME Instruction;Gait training;Stair training;Functional mobility training;Therapeutic activities;Therapeutic exercise;Balance training;Neuromuscular re-education;Patient/family education;Manual techniques;Passive range of motion;Dry needling;Energy conservation    PT Next Visit Plan back precautions for comfort given multiple compression fxs; general strengthening, endurance, balance training program assess toelrance to HEP    PT Home Exercise Plan Access Code: [email or web address]  URL: [email or web address]  Date: 10/20/2021  Prepared by: Ethel Rana    Exercises  Seated Long Arc Quad - 1 x daily - 7 x weekly - 1 sets - 10 reps  Seated Hip Abduction - 1 x daily - 7 x weekly - 1 sets - 10 reps  Seated Heel Toe Raises - 1 x daily - 7 x weekly - 1 sets - 10 reps    Consulted and Agree with Plan of Care Patient             Patient will  benefit from skilled therapeutic intervention in order to improve the following deficits and impairments:  Abnormal gait, Decreased coordination, Difficulty walking, Cardiopulmonary status limiting activity, Decreased endurance, Decreased safety awareness, Decreased activity tolerance, Pain, Decreased balance, Impaired flexibility, Improper body mechanics, Decreased mobility, Decreased strength, Postural dysfunction  Visit Diagnosis: Muscle weakness (generalized)  Chronic bilateral low back pain without sciatica  Unsteadiness on feet  Abnormal posture     Problem List Patient Active Problem List   Diagnosis Date Noted   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/09/2021   Pneumonia due to COVID-19 virus 07/01/2020   Macrocytic anemia 07/01/2020   Thrombocytopenia ([place]) 07/01/2020   Acute hypoxemic respiratory failure due to COVID-19 ([place]) 07/01/2020   Cellulitis of left lower extremity 04/30/2017   Cellulitis 04/30/2017   Edema of left lower extremity 04/23/2017   AKI (acute kidney injury) ([place]) 04/23/2017   ARF (acute renal failure) ([place]) 04/22/2017   DVT of lower extremity, bilateral ([place]) 02/10/2015   PE (pulmonary embolism)    Shortness of breath    Normocytic anemia 02/07/2015   DVT (deep venous thrombosis) (HCC)    Acute pulmonary embolism ([place]) 02/06/2015   Scoliosis of thoracic spine 02/06/2015   GERD (gastroesophageal reflux disease) 02/06/2015   Acute respiratory failure with hypoxia ([place]) 02/06/2015   Acute kidney injury ([place]) 02/06/2015   Elevated troponin 02/06/2015    Marcelina Morel, DPT 11/24/2021, 11:00 AM  [address] [place], [place], [address] Phone: [phone]   Fax:  [phone]  Name: [name] May MRN: [id number] Date of Birth: 12/22/1940

## 2021-11-28 ENCOUNTER — Encounter: Payer: Self-pay | Admitting: Physical Therapy

## 2021-11-28 ENCOUNTER — Encounter: Payer: Medicare Other | Attending: Physical Medicine & Rehabilitation | Admitting: Physical Medicine & Rehabilitation

## 2021-11-28 ENCOUNTER — Encounter: Payer: Self-pay | Admitting: Physical Medicine & Rehabilitation

## 2021-11-28 ENCOUNTER — Ambulatory Visit: Payer: Medicare Other | Admitting: Physical Therapy

## 2021-11-28 ENCOUNTER — Other Ambulatory Visit: Payer: Self-pay

## 2021-11-28 VITALS — BP 136/81 | HR 86 | Temp 98.3°F | Ht <= 58 in | Wt 144.0 lb

## 2021-11-28 DIAGNOSIS — Z5181 Encounter for therapeutic drug level monitoring: Secondary | ICD-10-CM | POA: Diagnosis present

## 2021-11-28 DIAGNOSIS — Z79891 Long term (current) use of opiate analgesic: Secondary | ICD-10-CM | POA: Insufficient documentation

## 2021-11-28 DIAGNOSIS — G8929 Other chronic pain: Secondary | ICD-10-CM

## 2021-11-28 DIAGNOSIS — G894 Chronic pain syndrome: Secondary | ICD-10-CM | POA: Insufficient documentation

## 2021-11-28 DIAGNOSIS — R293 Abnormal posture: Secondary | ICD-10-CM

## 2021-11-28 DIAGNOSIS — M6281 Muscle weakness (generalized): Secondary | ICD-10-CM | POA: Diagnosis not present

## 2021-11-28 DIAGNOSIS — R2681 Unsteadiness on feet: Secondary | ICD-10-CM

## 2021-11-28 MED ORDER — SUMATRIPTAN SUCCINATE 50 MG PO TABS: 50.0000 mg | ORAL_TABLET | Freq: Once | ORAL | 0 refills | Status: AC | PRN

## 2021-11-28 MED ORDER — HYDROCODONE-ACETAMINOPHEN 10-325 MG PO TABS: 0.5000 | ORAL_TABLET | ORAL | 0 refills | Status: DC | PRN

## 2021-11-28 NOTE — Patient Instructions (Signed)
Please let us know if Left thigh pain increases, we can check a Lumbar MRI if needed

## 2021-11-28 NOTE — Therapy (Signed)
[name] [place], [place], [address] Phone: [phone]   Fax:  [phone]  Physical Therapy Treatment  Patient Details  Name: [name] May MRN: [id number] Date of Birth: 10/28/1941 Referring Provider (PT): Alysia Penna   Encounter Date: 11/28/2021   PT End of Session - 11/28/21 1155     Visit Number 12    Number of Visits 17    Date for PT Re-Evaluation 12/05/21    Authorization Type UHC MCR    Authorization Time Period 10/10/21 to 12/05/21    Progress Note Due on Visit 20    PT Start Time 1102    PT Stop Time 1144    PT Time Calculation (min) 42 min    Activity Tolerance Patient tolerated treatment well    Behavior During Therapy [name] for tasks assessed/performed             Past Medical History:  Diagnosis Date   Allergy    Anemia    Arthritis    left knee   Back pain    Bipolar disorder (HCC)    Chronic headaches    COPD (chronic obstructive pulmonary disease) (HCC)    Degenerative disorder of bone    Depression    Esophageal ulcer    history   GERD (gastroesophageal reflux disease)    Hyperlipidemia    Peripheral vascular disease ([place])    Pulmonary embolism ([place])    Scoliosis     Past Surgical History:  Procedure Laterality Date   ABDOMINAL HYSTERECTOMY     ANKLE SURGERY     COLONOSCOPY     EYE SURGERY     caratacs   FEMORAL ARTERY EXPLORATION  05/16/2017   Procedure: STENT OF BILATERAL COMMON FEMORAL VEIN WITH 16 X 90 WALL STENT;  Surgeon: Waynetta Sandy, MD;  Location: [facility];  Service: Vascular;;   hemroidectomy     INSERTION OF ILIAC STENT Bilateral 05/16/2017   Procedure: STENT OF BILATERAL COMMON AND EXTERNAL ILIAC VEIN WITH 18 X 90 WALL STENT;  Surgeon: Waynetta Sandy, MD;  Location: [facility];  Service: Vascular;  Laterality: Bilateral;   INTRAVASCULAR ULTRASOUND/IVUS N/A 05/06/2017   Procedure: INTRAVASCULAR ULTRASOUND/IVUS;  Surgeon: Waynetta Sandy, MD;  Location: [facility];  Service: Vascular;  Laterality: N/A;   IVC FILTER INSERTION N/A 05/16/2017   Procedure: MECHANICAL THROMBECTOMY OF IVC FILTER;  Surgeon: Waynetta Sandy, MD;  Location: [facility];  Service: Vascular;  Laterality: N/A;   LAMINECTOMY     LOWER EXTREMITY VENOGRAPHY N/A 05/14/2017   Procedure: Lower Extremity Venography;  Surgeon: Serafina Mitchell, MD;  Location: [facility];  Service: Cardiovascular;  Laterality: N/A;   NASAL SINUS SURGERY     PERCUTANEOUS VENOUS THROMBECTOMY,LYSIS WITH INTRAVASCULAR ULTRASOUND (IVUS) N/A 05/16/2017   Procedure: PERCUTANEOUS VENOUS THROMBECTOMY AND LYSIS WITH INTRAVASCULAR ULTRASOUND (IVUS);  Surgeon: Waynetta Sandy, MD;  Location: [facility];  Service: Vascular;  Laterality: N/A;   VENA CAVA FILTER PLACEMENT N/A 05/16/2017   Procedure: RETRIEVAL INFERIOR VENA-CAVA FILTER;  Surgeon: Waynetta Sandy, MD;  Location: [facility];  Service: Vascular;  Laterality: N/A;   VENOGRAM Bilateral 05/16/2017   Procedure: VENOGRAM OF IVC;  Surgeon: Waynetta Sandy, MD;  Location: [facility];  Service: Vascular;  Laterality: Bilateral;    There were no vitals filed for this visit.   Subjective Assessment - 11/28/21 1104     Subjective I'm doing much better, the outside of  my left leg still bothers me but overall its not much better. It burns every single night. When I'm up walking during the day, I don't notice it- it bothers me more when I'm at rest. Both knees have been giving me a fit too. Ribs are good. Not sure what flared up knees, its been an on and off problem for awhile but it happens every night now. I'd like to focus on balance today, this is still a "big fish" for me.    Patient Stated Goals strengthening what we can    Currently in Pain? Yes    Pain Score 6     Pain Location Knee    Pain Orientation Right;Left    Pain Descriptors / Indicators Discomfort;Sharp    Pain Type Chronic pain                                [facility] Adult PT Treatment/Exercise - 11/28/21 0001       Manual Therapy   Manual Therapy Myofascial release;Soft tissue mobilization    Myofascial Release IASTM L ITB/lateral thigh                 Balance Exercises - 11/28/21 0001       Balance Exercises: Standing   Standing Eyes Opened Narrow base of support (BOS);Foam/compliant surface   horizontal and vertical head turns   Standing Eyes Closed Narrow base of support (BOS);Foam/compliant surface;1 rep   x60 seconds   Tandem Gait Forward   with tandem stance holds x30 seconds x4B   Other Standing Exercises heel raises swinging to toe raise on foam 1x20; marching on tramponline 1x20, external perturbations on trampoline                PT Education - 11/28/21 1155     Education Details POC moving forward    Person(s) Educated Patient    Methods Explanation    Comprehension Verbalized understanding              PT Short Term Goals - 11/24/21 1043       PT SHORT TERM GOAL #4   Title Will be able to complete bed mobility and functional lifting with good mechanics in order to protect back and reduce further risk of additional compression fractures    Status Achieved               PT Long Term Goals - 11/21/21 1129       PT LONG TERM GOAL #1   Title MMT will improve by at least one grade in core and all other weak groups in order to reduce pain/improve function    Baseline LLE 4-/5, RLE 4/5    Time 4    Period Weeks    Status On-going    Target Date 12/05/21      PT LONG TERM GOAL #2   Title Will score at least 48 on the Berg in order to show reduced fall risk    Baseline 47/56    Time 4    Period Weeks    Status On-going    Target Date 12/19/21      PT LONG TERM GOAL #3   Title Will be able to ambulate community distances with [facility] and no unsteadiness in order to improve community access with LRAD    Baseline She does minimal community walking, but  limited in distance    Time 4  Period Weeks    Status On-going    Target Date 12/19/21      PT LONG TERM GOAL #4   Title Will be compliant with appropriate gym based program such as silver sneakers in order to maintain functional gains and reduce risk of backslide of physical gains from PT    Time 8    Period Weeks    Status New                   Plan - 11/28/21 1155     Clinical Impression Statement Ms. Haggard arrives today doing well, having a lot of lateral L LE pain and burning at night. Worked on balance a bit more per her request, also addressed LLE symptoms which improved nicely with IASTM- does have quite a bit of mm spasm and knotting in this area. I also lowered her cane by 2 notches as it seemed a bit tall, we can return to the prior height next session if it doesnt work well for her.    Personal Factors and Comorbidities Age;Behavior Pattern;Social Background;Fitness;Time since onset of injury/illness/exacerbation;Past/Current Experience    Examination-Activity Limitations Squat;Bed Mobility;Lift;Stairs;Bend;Locomotion Level;Stand;Reach Overhead;Carry;Transfers;Sit;Sleep    Examination-Participation Restrictions Church;Cleaning;Shop;Community Activity;Volunteer;Meal Prep;Yard Work    Biomedical scientist Low    Rehab Potential Good    PT Frequency 2x / week    PT Duration 4 weeks    PT Treatment/Interventions ADLs/Self Care Home Management;Cryotherapy;Electrical Stimulation;Iontophoresis 4mg /ml Dexamethasone;Moist Heat;Ultrasound;DME Instruction;Gait training;Stair training;Functional mobility training;Therapeutic activities;Therapeutic exercise;Balance training;Neuromuscular re-education;Patient/family education;Manual techniques;Passive range of motion;Dry needling;Energy conservation    PT Next Visit Plan back precautions for comfort given multiple compression fxs; general strengthening,  endurance, balance training program assess toelrance to HEP. Keep working on L ITB area, teach home management strategy    PT Home Exercise Plan Access Code: [email or web address]  URL: [email or web address]  Date: 10/20/2021  Prepared by: Ethel Rana    Exercises  Seated Long Arc Quad - 1 x daily - 7 x weekly - 1 sets - 10 reps  Seated Hip Abduction - 1 x daily - 7 x weekly - 1 sets - 10 reps  Seated Heel Toe Raises - 1 x daily - 7 x weekly - 1 sets - 10 reps    Consulted and Agree with Plan of Care Patient             Patient will benefit from skilled therapeutic intervention in order to improve the following deficits and impairments:  Abnormal gait, Decreased coordination, Difficulty walking, Cardiopulmonary status limiting activity, Decreased endurance, Decreased safety awareness, Decreased activity tolerance, Pain, Decreased balance, Impaired flexibility, Improper body mechanics, Decreased mobility, Decreased strength, Postural dysfunction  Visit Diagnosis: Muscle weakness (generalized)  Chronic bilateral low back pain without sciatica  Abnormal posture  Unsteadiness on feet     Problem List Patient Active Problem List   Diagnosis Date Noted   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/09/2021   Pneumonia due to COVID-19 virus 07/01/2020   Macrocytic anemia 07/01/2020   Thrombocytopenia ([place]) 07/01/2020   Acute hypoxemic respiratory failure due to COVID-19 ([place]) 07/01/2020   Cellulitis of left lower extremity 04/30/2017   Cellulitis 04/30/2017   Edema of left lower extremity 04/23/2017   AKI (acute kidney injury) ([place]) 04/23/2017   ARF (acute renal failure) ([place]) 04/22/2017   DVT of lower extremity, bilateral ([place]) 02/10/2015   PE (pulmonary embolism)    Shortness of breath    Normocytic anemia 02/07/2015  DVT (deep venous thrombosis) (HCC)    Acute pulmonary embolism ([place]) 02/06/2015   Scoliosis of thoracic spine 02/06/2015   GERD (gastroesophageal reflux  disease) 02/06/2015   Acute respiratory failure with hypoxia ([place]) 02/06/2015   Acute kidney injury ([place]) 02/06/2015   Elevated troponin 02/06/2015   Ann Lions PT, DPT, PN2   Supplemental Physical Therapist [place]    Pager [phone] Acute Rehab Office [place] [place], [place], [address] Phone: [phone]   Fax:  [phone]  Name: MEYAH CORLE MRN: [id number] Date of Birth: 12/15/1940

## 2021-11-28 NOTE — Progress Notes (Signed)
Subjective:    Patient ID: [name] May, female    DOB: 06-May-1941, 81 y.o.   MRN: [id number]  HPI  CC:  Left lateral thigh numbness Onset of Left lateral thigh numbness onset 1-2 mo ago.  Burning pain.  Also has bilateral knee pain .  The patient cannot exactly say when the thigh pain came on there was no trauma to the area.  She does not note any weakness of the left leg.  No new bowel or bladder dysfunction. In regards to bilateral knee pain she has had knee pain for a number of years.  Her last x-ray on the right side is in 2016 and on the left side was 2010.  There was some chondrocalcinosis in the menisci.  No significant joint space narrowing at that time.  81 year old female who follows up today for chronic low back and rib pain.  She has a history of severe osteoporosis and has had multiple fractures most recent of which is right 11th rib fracture seen on x-ray 11/07/2021.  Patient has chronic compression fractures at T7 T9 and T12.  The patient does take Tylenol 650 mg every 8 hours as needed she rarely takes hydrocodone 5 mg prescribed 10/01/2021 and still has 6 left today.  Left 10th rib fx on 11/07/21 , had acute onset of Left lower rib pain.   The patient is attending outpatient PT.  Last treatment was today Pain Inventory Average Pain 4 Pain Right Now 4 My pain is burning  In the last 24 hours, has pain interfered with the following? General activity 6 Relation with others 6 Enjoyment of life 6 What TIME of day is your pain at its worst? night Sleep (in general) Fair  Pain is worse with: standing Pain improves with: rest, heat/ice, therapy/exercise, and medication Relief from Meds: 8  Family History  Problem Relation Age of Onset   Deep vein thrombosis Neg Hx    Social History   Socioeconomic History   Marital status: Widowed    Spouse name: Not on file   Number of children: Not on file   Years of education: Not on file   Highest education level: Not on  file  Occupational History   Not on file  Tobacco Use   Smoking status: Former   Smokeless tobacco: Never   Tobacco comments:    quit 25 years ago   Vaping Use   Vaping Use: Never used  Substance and Sexual Activity   Alcohol use: No   Drug use: No   Sexual activity: Not Currently  Other Topics Concern   Not on file  Social History Narrative   Not on file   Social Determinants of Health   Financial Resource Strain: Not on file  Food Insecurity: Not on file  Transportation Needs: Not on file  Physical Activity: Not on file  Stress: Not on file  Social Connections: Not on file   Past Surgical History:  Procedure Laterality Date   ABDOMINAL HYSTERECTOMY     [address]  05/16/2017   Procedure: STENT OF BILATERAL COMMON FEMORAL VEIN WITH 16 X 90 WALL STENT;  Surgeon: Waynetta Sandy, MD;  Location: [place];  Service: Vascular;;   hemroidectomy     INSERTION OF ILIAC STENT Bilateral 05/16/2017   Procedure: STENT OF BILATERAL COMMON AND EXTERNAL ILIAC VEIN WITH 18 X 90  WALL STENT;  Surgeon: Waynetta Sandy, MD;  Location: [place];  Service: Vascular;  Laterality: Bilateral;   INTRAVASCULAR ULTRASOUND/IVUS N/A 05/06/2017   Procedure: INTRAVASCULAR ULTRASOUND/IVUS;  Surgeon: Waynetta Sandy, MD;  Location: [place];  Service: Vascular;  Laterality: N/A;   IVC FILTER INSERTION N/A 05/16/2017   Procedure: MECHANICAL THROMBECTOMY OF IVC FILTER;  Surgeon: Waynetta Sandy, MD;  Location: [place];  Service: Vascular;  Laterality: N/A;   LAMINECTOMY     LOWER EXTREMITY VENOGRAPHY N/A 05/14/2017   Procedure: Lower Extremity Venography;  Surgeon: Serafina Mitchell, MD;  Location: [facility];  Service: Cardiovascular;  Laterality: N/A;   NASAL SINUS SURGERY     PERCUTANEOUS VENOUS THROMBECTOMY,LYSIS WITH INTRAVASCULAR ULTRASOUND (IVUS) N/A 05/16/2017   Procedure: PERCUTANEOUS VENOUS  THROMBECTOMY AND LYSIS WITH INTRAVASCULAR ULTRASOUND (IVUS);  Surgeon: Waynetta Sandy, MD;  Location: [place];  Service: Vascular;  Laterality: N/A;   VENA CAVA FILTER PLACEMENT N/A 05/16/2017   Procedure: RETRIEVAL INFERIOR VENA-CAVA FILTER;  Surgeon: Waynetta Sandy, MD;  Location: [facility];  Service: Vascular;  Laterality: N/A;   VENOGRAM Bilateral 05/16/2017   Procedure: VENOGRAM OF IVC;  Surgeon: Waynetta Sandy, MD;  [facility];  Service: Vascular;  Laterality: Bilateral;   Past Surgical History:  Procedure Laterality Date   ABDOMINAL HYSTERECTOMY     ANKLE SURGERY     COLONOSCOPY     EYE SURGERY     caratacs   FEMORAL ARTERY EXPLORATION  05/16/2017   Procedure: STENT OF BILATERAL COMMON FEMORAL VEIN WITH 16 X 90 WALL STENT;  Surgeon: Waynetta Sandy, MD;  Location: [place];  Service: Vascular;;   hemroidectomy     INSERTION OF ILIAC STENT Bilateral 05/16/2017   Procedure: STENT OF BILATERAL COMMON AND EXTERNAL ILIAC VEIN WITH 18 X 90 WALL STENT;  Surgeon: Waynetta Sandy, MD;  Location: [place];  Service: Vascular;  Laterality: Bilateral;   INTRAVASCULAR ULTRASOUND/IVUS N/A 05/06/2017   Procedure: INTRAVASCULAR ULTRASOUND/IVUS;  Surgeon: Waynetta Sandy, MD;  Location: [place];  Service: Vascular;  Laterality: N/A;   IVC FILTER INSERTION N/A 05/16/2017   Procedure: MECHANICAL THROMBECTOMY OF IVC FILTER;  Surgeon: Waynetta Sandy, MD;  Location: [place];  Service: Vascular;  Laterality: N/A;   LAMINECTOMY     LOWER EXTREMITY VENOGRAPHY N/A 05/14/2017   Procedure: Lower Extremity Venography;  Surgeon: Serafina Mitchell, MD;  Location: [place] [facility];  Service: Cardiovascular;  Laterality: N/A;   NASAL SINUS SURGERY     PERCUTANEOUS VENOUS THROMBECTOMY,LYSIS WITH INTRAVASCULAR ULTRASOUND (IVUS) N/A 05/16/2017   Procedure: PERCUTANEOUS VENOUS THROMBECTOMY AND LYSIS WITH INTRAVASCULAR ULTRASOUND (IVUS);  Surgeon: Waynetta Sandy, MD;  Location: [place];  Service: Vascular;  Laterality: N/A;   VENA CAVA FILTER PLACEMENT N/A 05/16/2017   Procedure: RETRIEVAL INFERIOR VENA-CAVA FILTER;  Surgeon: Waynetta Sandy, MD;  Location: [place];  Service: Vascular;  Laterality: N/A;   VENOGRAM Bilateral 05/16/2017   Procedure: VENOGRAM OF IVC;  Surgeon: Waynetta Sandy, MD;  Location: [place];  Service: Vascular;  Laterality: Bilateral;   Past Medical History:  Diagnosis Date   Allergy    Anemia    Arthritis    left knee   Back pain    Bipolar disorder ([place])    Chronic headaches    COPD (chronic obstructive pulmonary disease) ([place])    Degenerative disorder of bone    Depression    Esophageal ulcer    history  GERD (gastroesophageal reflux disease)    Hyperlipidemia    Peripheral vascular disease (HCC)    Pulmonary embolism (HCC)    Scoliosis    BP 136/81    Pulse 86    Temp 98.3 F (36.8 C)    Ht 4\' 9"  (1.448 m)    Wt 144 lb (65.3 kg)    SpO2 95%    BMI 31.16 kg/m   Opioid Risk Score:   Fall Risk Score:  `1  Depression screen PHQ 2/9  Depression screen [facility] 2/9 11/28/2021 10/03/2021 08/31/2021 08/10/2021 06/09/2021  Decreased Interest 0 0 1 1 0  Down, Depressed, Hopeless 0 0 1 1 1   PHQ - 2 Score 0 0 2 2 1   Altered sleeping - - - - 2  Tired, decreased energy - - - - 2  Change in appetite - - - - 0  Feeling bad or failure about yourself  - - - - 0  Trouble concentrating - - - - 2  Moving slowly or fidgety/restless - - - - 1  Suicidal thoughts - - - - 0  PHQ-9 Score - - - - 8     Review of Systems  Constitutional: Negative.   HENT: Negative.    Eyes: Negative.   Respiratory: Negative.    Cardiovascular: Negative.   Gastrointestinal: Negative.   Endocrine: Negative.   Genitourinary: Negative.   Musculoskeletal: Negative.   Skin: Negative.   Allergic/Immunologic: Negative.   Neurological: Negative.   Hematological: Negative.   Psychiatric/Behavioral:  Positive for sleep  disturbance.       Objective:   Physical Exam Vitals and nursing note reviewed.  Constitutional:      Appearance: She is normal weight.  HENT:     Head: Normocephalic and atraumatic.  Eyes:     Extraocular Movements: Extraocular movements intact.     Conjunctiva/sclera: Conjunctivae normal.     Pupils: Pupils are equal, round, and reactive to light.  Musculoskeletal:     Comments: Kyphoscoliosis  Convex to Right in THoracic area No tenderness along the thoracic or lumbar spine area.  No rib cage tenderness.  No pain over the greater trochanters  Neurological:     Mental Status: She is alert.     Comments: Motor strength is 5/5 bilateral hip flexor knee extensor ankle dorsiflexor Negative straight leg raising test bilaterally Sensation normal to touch bilateral thigh area Ambulates with kyphotic posture no evidence of toe drag or knee instability  Psychiatric:        Mood and Affect: Mood normal.        Behavior: Behavior normal.          Assessment & Plan:  1.  History of osteoporosis with multiple thoracic and lumbar compression fractures as well as bilateral rib fractures, patient is no longer symptomatic at these levels.  2.  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  Bilateral knee pain question osteoarthritis although no changes were seen however her last x-rays were performed greater than 7 years ago. Will repeat bilateral knee x-rays, would avoid steroids, consider Synvisc  4.  Left lateral thigh pain no neurologic deficits, question if this could be radicular, meralgia paresthetica is also in the differential.  At this point the patient does not feel it is sufficiently severe to warrant further investigation.  She will keep [place] updated. 5.  Chronic pain syndrome multifactorial continue hydrocodone 10 mg she takes 1/2 to 1  tablet/day.  We will check UDS we will do CSA, nurse practitioner visit 1 month, MD visit  in 2 months. Moderate medical decision making

## 2021-12-01 ENCOUNTER — Encounter: Payer: Self-pay | Admitting: Physical Therapy

## 2021-12-01 ENCOUNTER — Ambulatory Visit: Payer: Medicare Other | Admitting: Physical Therapy

## 2021-12-01 ENCOUNTER — Other Ambulatory Visit: Payer: Self-pay

## 2021-12-01 DIAGNOSIS — M6281 Muscle weakness (generalized): Secondary | ICD-10-CM | POA: Diagnosis not present

## 2021-12-01 DIAGNOSIS — M545 Low back pain, unspecified: Secondary | ICD-10-CM

## 2021-12-01 DIAGNOSIS — G8929 Other chronic pain: Secondary | ICD-10-CM

## 2021-12-01 DIAGNOSIS — R2681 Unsteadiness on feet: Secondary | ICD-10-CM

## 2021-12-01 DIAGNOSIS — R293 Abnormal posture: Secondary | ICD-10-CM

## 2021-12-01 NOTE — Therapy (Signed)
[address] [place], [place], [address] Phone: [phone]   Fax:  [phone]  Physical Therapy Treatment  Patient Details  Name: [name] May MRN: [id number] Date of Birth: Nov 27, 1940 Referring Provider (PT): Alysia Penna   Encounter Date: 12/01/2021   PT End of Session - 12/01/21 1107     Visit Number 13    Number of Visits 17    Date for PT Re-Evaluation 12/19/21    Authorization Type [employer] MCR    Authorization Time Period 10/10/21 to 12/05/21; extended to 2/7    Progress Note Due on Visit 20    PT Start Time 0933    PT Stop Time 1016    PT Time Calculation (min) 43 min    Activity Tolerance Patient tolerated treatment well    Behavior During Therapy [facility] for tasks assessed/performed             Past Medical History:  Diagnosis Date   Allergy    Anemia    Arthritis    left knee   Back pain    Bipolar disorder (HCC)    Chronic headaches    COPD (chronic obstructive pulmonary disease) ([place])    Degenerative disorder of bone    Depression    Esophageal ulcer    history   GERD (gastroesophageal reflux disease)    Hyperlipidemia    Peripheral vascular disease ([place])    Pulmonary embolism ([place])    Scoliosis     Past Surgical History:  Procedure Laterality Date   ABDOMINAL HYSTERECTOMY     ANKLE SURGERY     COLONOSCOPY     EYE SURGERY     caratacs   FEMORAL ARTERY EXPLORATION  05/16/2017   Procedure: STENT OF BILATERAL COMMON FEMORAL VEIN WITH 16 X 90 WALL STENT;  Surgeon: Waynetta Sandy, MD;  Location: [place];  Service: Vascular;;   hemroidectomy     INSERTION OF ILIAC STENT Bilateral 05/16/2017   Procedure: STENT OF BILATERAL COMMON AND EXTERNAL ILIAC VEIN WITH 18 X 90 WALL STENT;  Surgeon: Waynetta Sandy, MD;  Location: [place];  Service: Vascular;  Laterality: Bilateral;   INTRAVASCULAR ULTRASOUND/IVUS N/A 05/06/2017   Procedure: INTRAVASCULAR ULTRASOUND/IVUS;   Surgeon: Waynetta Sandy, MD;  Location: [place];  Service: Vascular;  Laterality: N/A;   IVC FILTER INSERTION N/A 05/16/2017   Procedure: MECHANICAL THROMBECTOMY OF IVC FILTER;  Surgeon: Waynetta Sandy, MD;  Location: [place];  Service: Vascular;  Laterality: N/A;   LAMINECTOMY     LOWER EXTREMITY VENOGRAPHY N/A 05/14/2017   Procedure: Lower Extremity Venography;  Surgeon: Serafina Mitchell, MD;  Location: [place] [facility];  Service: Cardiovascular;  Laterality: N/A;   NASAL SINUS SURGERY     PERCUTANEOUS VENOUS THROMBECTOMY,LYSIS WITH INTRAVASCULAR ULTRASOUND (IVUS) N/A 05/16/2017   Procedure: PERCUTANEOUS VENOUS THROMBECTOMY AND LYSIS WITH INTRAVASCULAR ULTRASOUND (IVUS);  Surgeon: Waynetta Sandy, MD;  Location: [place];  Service: Vascular;  Laterality: N/A;   VENA CAVA FILTER PLACEMENT N/A 05/16/2017   Procedure: RETRIEVAL INFERIOR VENA-CAVA FILTER;  Surgeon: Waynetta Sandy, MD;  Location: [place];  Service: Vascular;  Laterality: N/A;   VENOGRAM Bilateral 05/16/2017   Procedure: VENOGRAM OF IVC;  Surgeon: Waynetta Sandy, MD;  Location: [place];  Service: Vascular;  Laterality: Bilateral;    There were no vitals filed for this visit.   Subjective Assessment - 12/01/21 0937     Subjective I'm feeling well, the  soft tissue work we did helped my leg a lot. I'm still concerned about my balance. I miss my dog terribly, there have been some days with tears recently.    Pertinent History PVD, scoliosi, GERD, osteoporosis, osteopenia    Patient Stated Goals strengthening what we can    Currently in Pain? Yes    Pain Score 4     Pain Location Leg    Pain Orientation Left;Lateral;Proximal    Pain Descriptors / Indicators Discomfort;Sharp;Burning    Pain Type Chronic pain                               [facility] Adult PT Treatment/Exercise - 12/01/21 0001       Knee/Hip Exercises: Standing   Heel Raises Both;1 set;20 reps    Heel Raises  Limitations blue foam pad    Forward Step Up Both;10 reps    Forward Step Up Limitations BOSU U UE support    Step Down Both;1 set;10 reps    Step Down Limitations 4 inch step on R 2 inch step on L    Wall Squat 1 set;15 reps    Wall Squat Limitations had to adjust depth, tolerated 1/3 depth ok    Other Standing Knee Exercises standing marches 1x10 B blue foam pad    Other Standing Knee Exercises 3 way cone taps 1x5 BLE on blue foam pad      Manual Therapy   Manual Therapy Soft tissue mobilization    Soft tissue mobilization IASM L ITB/lateral proximal thigh                     PT Education - 12/01/21 1107     Education Details DN, potential benefits of; POC moving forward, will plan to reassess around 2/7    Person(s) Educated Patient    Methods Explanation    Comprehension Verbalized understanding              PT Short Term Goals - 11/24/21 1043       PT SHORT TERM GOAL #4   Title Will be able to complete bed mobility and functional lifting with good mechanics in order to protect back and reduce further risk of additional compression fractures    Status Achieved               PT Long Term Goals - 11/21/21 1129       PT LONG TERM GOAL #1   Title MMT will improve by at least one grade in core and all other weak groups in order to reduce pain/improve function    Baseline LLE 4-/5, RLE 4/5    Time 4    Period Weeks    Status On-going    Target Date 12/05/21      PT LONG TERM GOAL #2   Title Will score at least 48 on the [place] in order to show reduced fall risk    Baseline 47/56    Time 4    Period Weeks    Status On-going    Target Date 12/19/21      PT LONG TERM GOAL #3   Title Will be able to ambulate community distances with [facility] and no unsteadiness in order to improve community access with LRAD    Baseline She does minimal community walking, but limited in distance    Time 4    Period Weeks    Status On-going  Target Date 12/19/21       PT LONG TERM GOAL #4   Title Will be compliant with appropriate gym based program such as silver sneakers in order to maintain functional gains and reduce risk of backslide of physical gains from PT    Time 8    Period Weeks    Status New                   Plan - 12/01/21 1108     Clinical Impression Statement Ms. Hipp arrives today doing OK, still feeling sad because of her dog passing. Still having some pain in her lateral left leg but its slightly better. We spent some time working on strength and balance today, also continued working on STM to L lateral LE with good tolerance and response. May benefit from DN to really take care of this area, we can potentially try this next week. Sent updated cert to MD today as well.    Personal Factors and Comorbidities Age;Behavior Pattern;Social Background;Fitness;Time since onset of injury/illness/exacerbation;Past/Current Experience    Examination-Activity Limitations Squat;Bed Mobility;Lift;Stairs;Bend;Locomotion Level;Stand;Reach Overhead;Carry;Transfers;Sit;Sleep    Examination-Participation Restrictions Church;Cleaning;Shop;Community Activity;Volunteer;Meal Prep;Yard Work    Biomedical scientist Low    Rehab Potential Good    PT Frequency 2x / week    PT Duration 4 weeks    PT Treatment/Interventions ADLs/Self Care Home Management;Cryotherapy;Electrical Stimulation;Iontophoresis 4mg /ml Dexamethasone;Moist Heat;Ultrasound;DME Instruction;Gait training;Stair training;Functional mobility training;Therapeutic activities;Therapeutic exercise;Balance training;Neuromuscular re-education;Patient/family education;Manual techniques;Passive range of motion;Dry needling;Energy conservation    PT Next Visit Plan back precautions for comfort given multiple compression fxs; general strengthening, endurance, balance training program assess [place] to HEP. Keep working on L ITB  area, try DN? Needs reassess 2/7    PT Home Exercise Plan Access Code: [email or web address]  URL: [email or web address]  Date: 10/20/2021  Prepared by: Ethel Rana    Exercises  Seated Long Arc Quad - 1 x daily - 7 x weekly - 1 sets - 10 reps  Seated Hip Abduction - 1 x daily - 7 x weekly - 1 sets - 10 reps  Seated Heel Toe Raises - 1 x daily - 7 x weekly - 1 sets - 10 reps    Consulted and Agree with Plan of Care Patient             Patient will benefit from skilled therapeutic intervention in order to improve the following deficits and impairments:  Abnormal gait, Decreased coordination, Difficulty walking, Cardiopulmonary status limiting activity, Decreased endurance, Decreased safety awareness, Decreased activity tolerance, Pain, Decreased balance, Impaired flexibility, Improper body mechanics, Decreased mobility, Decreased strength, Postural dysfunction  Visit Diagnosis: Muscle weakness (generalized)  Chronic bilateral low back pain without sciatica  Abnormal posture  Unsteadiness on feet     Problem List Patient Active Problem List   Diagnosis Date Noted   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/09/2021   Pneumonia due to COVID-19 virus 07/01/2020   Macrocytic anemia 07/01/2020   Thrombocytopenia ([place]) 07/01/2020   Acute hypoxemic respiratory failure due to COVID-19 ([place]) 07/01/2020   Cellulitis of left lower extremity 04/30/2017   Cellulitis 04/30/2017   Edema of left lower extremity 04/23/2017   AKI (acute kidney injury) ([place]) 04/23/2017   ARF (acute renal failure) ([place]) 04/22/2017   DVT of lower extremity, bilateral ([place]) 02/10/2015   PE (pulmonary embolism)    Shortness of breath    Normocytic anemia 02/07/2015   DVT (deep venous thrombosis) (HCC)    Acute pulmonary  embolism ([place]) 02/06/2015   Scoliosis of thoracic spine 02/06/2015   GERD (gastroesophageal reflux disease) 02/06/2015   Acute respiratory failure with hypoxia ([place]) 02/06/2015   Acute  kidney injury ([place]) 02/06/2015   Elevated troponin 02/06/2015   Ann Lions PT, DPT, PN2   Supplemental Physical Therapist [place]    Pager [phone] Acute Rehab Office [place] [place], [place], [address] Phone: [phone]   Fax:  [phone]  Name: JENYA PUTZ MRN: [id number] Date of Birth: 11-12-1941

## 2021-12-02 LAB — TOXASSURE SELECT,+ANTIDEPR,UR

## 2021-12-05 ENCOUNTER — Ambulatory Visit: Payer: Medicare Other | Admitting: Physical Therapy

## 2021-12-08 ENCOUNTER — Encounter: Payer: Self-pay | Admitting: Physical Therapy

## 2021-12-08 ENCOUNTER — Other Ambulatory Visit: Payer: Self-pay

## 2021-12-08 ENCOUNTER — Ambulatory Visit: Payer: Medicare Other | Admitting: Physical Therapy

## 2021-12-08 DIAGNOSIS — M6281 Muscle weakness (generalized): Secondary | ICD-10-CM

## 2021-12-08 DIAGNOSIS — R293 Abnormal posture: Secondary | ICD-10-CM

## 2021-12-08 DIAGNOSIS — G8929 Other chronic pain: Secondary | ICD-10-CM

## 2021-12-08 DIAGNOSIS — M545 Low back pain, unspecified: Secondary | ICD-10-CM

## 2021-12-08 DIAGNOSIS — R2681 Unsteadiness on feet: Secondary | ICD-10-CM

## 2021-12-08 NOTE — Patient Instructions (Signed)

## 2021-12-08 NOTE — Therapy (Signed)
[facility]. [place], [place], [address] Phone: [phone]   Fax:  [phone]  Physical Therapy Treatment  Patient Details  Name: [name] May MRN: [id number] Date of Birth: [birth year] Referring Provider (PT): Alysia Penna   Encounter Date: 12/08/2021   PT End of Session - 12/08/21 1010     Visit Number 14    Number of Visits 17    Date for PT Re-Evaluation 12/19/21    Authorization Type UHC MCR    Authorization Time Period 10/10/21 to 12/05/21; extended to 2/7    Progress Note Due on Visit 20    PT Start Time 0927    PT Stop Time 1012    PT Time Calculation (min) 45 min    Activity Tolerance Patient tolerated treatment well    Behavior During Therapy [facility] for tasks assessed/performed             Past Medical History:  Diagnosis Date   Allergy    Anemia    Arthritis    left knee   Back pain    Bipolar disorder (HCC)    Chronic headaches    COPD (chronic obstructive pulmonary disease) ([place])    Degenerative disorder of bone    Depression    Esophageal ulcer    history   GERD (gastroesophageal reflux disease)    Hyperlipidemia    Peripheral vascular disease ([place])    Pulmonary embolism ([place])    Scoliosis     Past Surgical History:  Procedure Laterality Date   ABDOMINAL HYSTERECTOMY     ANKLE SURGERY     COLONOSCOPY     EYE SURGERY     caratacs   FEMORAL ARTERY EXPLORATION  05/16/2017   Procedure: STENT OF BILATERAL COMMON FEMORAL VEIN WITH 16 X 90 WALL STENT;  Surgeon: Waynetta Sandy, MD;  Location: [facility];  Service: Vascular;;   hemroidectomy     INSERTION OF ILIAC STENT Bilateral 05/16/2017   Procedure: STENT OF BILATERAL COMMON AND EXTERNAL ILIAC VEIN WITH 18 X 90 WALL STENT;  Surgeon: Waynetta Sandy, MD;  Location: [place];  Service: Vascular;  Laterality: Bilateral;   INTRAVASCULAR ULTRASOUND/IVUS N/A 05/06/2017   Procedure: INTRAVASCULAR ULTRASOUND/IVUS;   Surgeon: Waynetta Sandy, MD;  Location: [facility];  Service: Vascular;  Laterality: N/A;   IVC FILTER INSERTION N/A 05/16/2017   Procedure: MECHANICAL THROMBECTOMY OF IVC FILTER;  Surgeon: Waynetta Sandy, MD;  Location: [facility];  Service: Vascular;  Laterality: N/A;   LAMINECTOMY     LOWER EXTREMITY VENOGRAPHY N/A 05/14/2017   Procedure: Lower Extremity Venography;  Surgeon: Serafina Mitchell, MD;  Location: [facility];  Service: Cardiovascular;  Laterality: N/A;   NASAL SINUS SURGERY     PERCUTANEOUS VENOUS THROMBECTOMY,LYSIS WITH INTRAVASCULAR ULTRASOUND (IVUS) N/A 05/16/2017   Procedure: PERCUTANEOUS VENOUS THROMBECTOMY AND LYSIS WITH INTRAVASCULAR ULTRASOUND (IVUS);  Surgeon: Waynetta Sandy, MD;  Location: [facility];  Service: Vascular;  Laterality: N/A;   VENA CAVA FILTER PLACEMENT N/A 05/16/2017   Procedure: RETRIEVAL INFERIOR VENA-CAVA FILTER;  Surgeon: Waynetta Sandy, MD;  Location: [facility];  Service: Vascular;  Laterality: N/A;   VENOGRAM Bilateral 05/16/2017   Procedure: VENOGRAM OF IVC;  Surgeon: Waynetta Sandy, MD;  Location: [facility];  Service: Vascular;  Laterality: Bilateral;    There were no vitals filed for this visit.   Subjective Assessment - 12/08/21 0931     Subjective Feeling well except the  pain in outside of L leg continues.    Pertinent History PVD, scoliosi, GERD, osteoporosis, osteopenia                               [facility] Adult PT Treatment/Exercise - 12/08/21 0001       Lumbar Exercises: Aerobic   Nustep L4 x 5 min      Knee/Hip Exercises: Stretches   Other Knee/Hip Stretches stretch for L lateral hip and ITB. Sidelying on R, positioned LLEin hip flexion with knee flexion and hip IR, then moved L knee straight, then maintained strainght knee and moved into hip extension with ADD. Paitent reported mild tightness hip flexion with knee ext and add and hip ext with ADD.      Knee/Hip Exercises: Standing    Hip Abduction Both;1 set;Stengthening;10 reps      Manual Therapy   Manual Therapy Soft tissue mobilization    Soft tissue mobilization IASM L ITB/lateral proximal thigh              Trigger Point Dry Needling - 12/08/21 0001     Consent Given? Yes    Education Handout Provided Yes    Muscles Treated Lower Quadrant Vastus lateralis    Muscles Treated Back/Hip Tensor fascia lata    Vastus lateralis Response Palpable increased muscle length    Tensor Fascia Lata Response Palpable increased muscle length                   PT Education - 12/08/21 1010     Education Details Seated piriformis stretch    Person(s) Educated Patient    Methods Explanation;Demonstration    Comprehension Returned demonstration;Verbalized understanding              PT Short Term Goals - 11/24/21 1043       PT SHORT TERM GOAL #4   Title Will be able to complete bed mobility and functional lifting with good mechanics in order to protect back and reduce further risk of additional compression fractures    Status Achieved               PT Long Term Goals - 11/21/21 1129       PT LONG TERM GOAL #1   Title MMT will improve by at least one grade in core and all other weak groups in order to reduce pain/improve function    Baseline LLE 4-/5, RLE 4/5    Time 4    Period Weeks    Status On-going    Target Date 12/05/21      PT LONG TERM GOAL #2   Title Will score at least 48 on the Berg in order to show reduced fall risk    Baseline 47/56    Time 4    Period Weeks    Status On-going    Target Date 12/19/21      PT LONG TERM GOAL #3   Title Will be able to ambulate community distances with [facility] and no unsteadiness in order to improve community access with LRAD    Baseline She does minimal community walking, but limited in distance    Time 4    Period Weeks    Status On-going    Target Date 12/19/21      PT LONG TERM GOAL #4   Title Will be compliant with appropriate  gym based program such as silver sneakers in order to maintain  functional gains and reduce risk of backslide of physical gains from PT    Time 8    Period Weeks    Status New                   Plan - 12/08/21 1011     Clinical Impression Statement Patient arrives reporting her L lateral thigh continues to hurt. Therapsit performed further assessment and active stretch to lateral L leg as well as STM and stretch to L piriformis.    Personal Factors and Comorbidities Age;Behavior Pattern;Social Background;Fitness;Time since onset of injury/illness/exacerbation;Past/Current Experience    Examination-Activity Limitations Squat;Bed Mobility;Lift;Stairs;Bend;Locomotion Level;Stand;Reach Overhead;Carry;Transfers;Sit;Sleep    Examination-Participation Restrictions Church;Cleaning;Shop;Community Activity;Volunteer;Meal Prep;Yard Work    Biomedical scientist Low    Rehab Potential Good    PT Frequency 2x / week    PT Duration 2 weeks    PT Treatment/Interventions ADLs/Self Care Home Management;Cryotherapy;Electrical Stimulation;Iontophoresis 4mg /ml Dexamethasone;Moist Heat;Ultrasound;DME Instruction;Gait training;Stair training;Functional mobility training;Therapeutic activities;Therapeutic exercise;Balance training;Neuromuscular re-education;Patient/family education;Manual techniques;Passive range of motion;Dry needling;Energy conservation    PT Next Visit Plan RE-assess 2/7. Assess [place] to treatment activities including stetch and DN.    PT Home Exercise Plan Access Code: [email or web address]    Consulted and Agree with Plan of Care Patient             Patient will benefit from skilled therapeutic intervention in order to improve the following deficits and impairments:  Abnormal gait, Decreased coordination, Difficulty walking, Cardiopulmonary status limiting activity, Decreased endurance, Decreased safety awareness,  Decreased activity tolerance, Pain, Decreased balance, Impaired flexibility, Improper body mechanics, Decreased mobility, Decreased strength, Postural dysfunction  Visit Diagnosis: Muscle weakness (generalized)  Chronic bilateral low back pain without sciatica  Abnormal posture  Unsteadiness on feet     Problem List Patient Active Problem List   Diagnosis Date Noted   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/09/2021   Pneumonia due to COVID-19 virus 07/01/2020   Macrocytic anemia 07/01/2020   Thrombocytopenia ([place]) 07/01/2020   Acute hypoxemic respiratory failure due to COVID-19 ([place]) 07/01/2020   Cellulitis of left lower extremity 04/30/2017   Cellulitis 04/30/2017   Edema of left lower extremity 04/23/2017   AKI (acute kidney injury) ([place]) 04/23/2017   ARF (acute renal failure) ([place]) 04/22/2017   DVT of lower extremity, bilateral ([place]) 02/10/2015   PE (pulmonary embolism)    Shortness of breath    Normocytic anemia 02/07/2015   DVT (deep venous thrombosis) (HCC)    Acute pulmonary embolism ([place]) 02/06/2015   Scoliosis of thoracic spine 02/06/2015   GERD (gastroesophageal reflux disease) 02/06/2015   Acute respiratory failure with hypoxia ([place]) 02/06/2015   Acute kidney injury ([place]) 02/06/2015   Elevated troponin 02/06/2015    Marcelina Morel, DPT 12/08/2021, 11:51 AM  [facility]. [place], [place], [address] Phone: [phone]   Fax:  [phone]  Name: MYELLE POTEAT MRN: [id number] Date of Birth: 02-Nov-1941

## 2021-12-12 ENCOUNTER — Ambulatory Visit: Payer: Medicare Other | Admitting: Physical Therapy

## 2021-12-12 ENCOUNTER — Encounter: Payer: Self-pay | Admitting: Physical Therapy

## 2021-12-12 ENCOUNTER — Telehealth: Payer: Self-pay | Admitting: *Deleted

## 2021-12-12 ENCOUNTER — Other Ambulatory Visit: Payer: Self-pay

## 2021-12-12 DIAGNOSIS — R2681 Unsteadiness on feet: Secondary | ICD-10-CM

## 2021-12-12 DIAGNOSIS — M6281 Muscle weakness (generalized): Secondary | ICD-10-CM

## 2021-12-12 DIAGNOSIS — R293 Abnormal posture: Secondary | ICD-10-CM

## 2021-12-12 DIAGNOSIS — G8929 Other chronic pain: Secondary | ICD-10-CM

## 2021-12-12 NOTE — Telephone Encounter (Signed)
Urine drug screen for this encounter is consistent for prescribed medication 

## 2021-12-12 NOTE — Therapy (Signed)
[facility]. [place], [place], [address] Phone: [phone]   Fax:  [phone]  Physical Therapy Treatment  Patient Details  Name: [name] May MRN: [id number] Date of Birth: 01/16/1941 Referring Provider (PT): Alysia Penna   Encounter Date: 12/12/2021   PT End of Session - 12/12/21 1023     Visit Number 15    Number of Visits 17    Date for PT Re-Evaluation 12/19/21    Authorization Type UHC MCR    Authorization Time Period 10/10/21 to 12/05/21; extended to 2/7    PT Start Time 0933    PT Stop Time 1007   DN not included in billing   PT Time Calculation (min) 34 min    Activity Tolerance Patient tolerated treatment well    Behavior During Therapy [facility] for tasks assessed/performed             Past Medical History:  Diagnosis Date   Allergy    Anemia    Arthritis    left knee   Back pain    Bipolar disorder (HCC)    Chronic headaches    COPD (chronic obstructive pulmonary disease) ([place])    Degenerative disorder of bone    Depression    Esophageal ulcer    history   GERD (gastroesophageal reflux disease)    Hyperlipidemia    Peripheral vascular disease ([place])    Pulmonary embolism ([place])    Scoliosis     Past Surgical History:  Procedure Laterality Date   ABDOMINAL HYSTERECTOMY     ANKLE SURGERY     COLONOSCOPY     EYE SURGERY     caratacs   FEMORAL ARTERY EXPLORATION  05/16/2017   Procedure: STENT OF BILATERAL COMMON FEMORAL VEIN WITH 16 X 90 WALL STENT;  Surgeon: Waynetta Sandy, MD;  Location: [facility];  Service: Vascular;;   hemroidectomy     INSERTION OF ILIAC STENT Bilateral 05/16/2017   Procedure: STENT OF BILATERAL COMMON AND EXTERNAL ILIAC VEIN WITH 18 X 90 WALL STENT;  Surgeon: Waynetta Sandy, MD;  Location: [facility];  Service: Vascular;  Laterality: Bilateral;   INTRAVASCULAR ULTRASOUND/IVUS N/A 05/06/2017   Procedure: INTRAVASCULAR ULTRASOUND/IVUS;  Surgeon:  Waynetta Sandy, MD;  Location: [facility];  Service: Vascular;  Laterality: N/A;   IVC FILTER INSERTION N/A 05/16/2017   Procedure: MECHANICAL THROMBECTOMY OF IVC FILTER;  Surgeon: Waynetta Sandy, MD;  Location: [facility];  Service: Vascular;  Laterality: N/A;   LAMINECTOMY     LOWER EXTREMITY VENOGRAPHY N/A 05/14/2017   Procedure: Lower Extremity Venography;  Surgeon: Serafina Mitchell, MD;  Location: [facility];  Service: Cardiovascular;  Laterality: N/A;   NASAL SINUS SURGERY     PERCUTANEOUS VENOUS THROMBECTOMY,LYSIS WITH INTRAVASCULAR ULTRASOUND (IVUS) N/A 05/16/2017   Procedure: PERCUTANEOUS VENOUS THROMBECTOMY AND LYSIS WITH INTRAVASCULAR ULTRASOUND (IVUS);  Surgeon: Waynetta Sandy, MD;  Location: [facility];  Service: Vascular;  Laterality: N/A;   VENA CAVA FILTER PLACEMENT N/A 05/16/2017   Procedure: RETRIEVAL INFERIOR VENA-CAVA FILTER;  Surgeon: Waynetta Sandy, MD;  Location: [place] [facility];  Service: Vascular;  Laterality: N/A;   VENOGRAM Bilateral 05/16/2017   Procedure: VENOGRAM OF IVC;  Surgeon: Waynetta Sandy, MD;  Location: [facility];  Service: Vascular;  Laterality: Bilateral;    There were no vitals filed for this visit.   Subjective Assessment - 12/12/21 0934     Subjective I'm doing well, we had our monthly  family lunch this weekend which I really enjoyed. I feel like the dry needling helped, but it didn't last very long, only for a couple of days. It melted away quickly. I feel like that area might be a little better but not by much. I would like to try it again.    Pertinent History PVD, scoliosi, GERD, osteoporosis, osteopenia    Patient Stated Goals strengthening what we can    Currently in Pain? Yes    Pain Score 4     Pain Location Leg    Pain Orientation Left;Lateral;Proximal    Pain Descriptors / Indicators Discomfort;Burning    Pain Type Chronic pain    Pain Onset More than a month ago    Pain Frequency Constant                                [facility] Adult PT Treatment/Exercise - 12/12/21 0001       Manual Therapy   Manual Therapy Soft tissue mobilization    Soft tissue mobilization IASM L ITB/lateral proximal thigh              Trigger Point Dry Needling - 12/12/21 0001     Consent Given? Yes    Muscles Treated Lower Quadrant Vastus lateralis    Muscles Treated Back/Hip Tensor fascia lata    Vastus lateralis Response Palpable increased muscle length    Tensor Fascia Lata Response Palpable increased muscle length            IASTM L lateral proximal thigh musculature and TFL group    Balance Exercises - 12/12/21 0001       Balance Exercises: Standing   Tandem Stance Eyes open;Foam/compliant surface;3 reps;20 secs    Tandem Gait Forward;Retro;3 reps    Other Standing Exercises standing marches on foam 1x10                PT Education - 12/12/21 1023     Education Details POC moving forward    Person(s) Educated Patient    Methods Explanation    Comprehension Verbalized understanding              PT Short Term Goals - 11/24/21 1043       PT SHORT TERM GOAL #4   Title Will be able to complete bed mobility and functional lifting with good mechanics in order to protect back and reduce further risk of additional compression fractures    Status Achieved               PT Long Term Goals - 11/21/21 1129       PT LONG TERM GOAL #1   Title MMT will improve by at least one grade in core and all other weak groups in order to reduce pain/improve function    Baseline LLE 4-/5, RLE 4/5    Time 4    Period Weeks    Status On-going    Target Date 12/05/21      PT LONG TERM GOAL #2   Title Will score at least 48 on the Berg in order to show reduced fall risk    Baseline 47/56    Time 4    Period Weeks    Status On-going    Target Date 12/19/21      PT LONG TERM GOAL #3   Title Will be able to ambulate community distances with SPC and no  unsteadiness in  order to improve community access with LRAD    Baseline She does minimal community walking, but limited in distance    Time 4    Period Weeks    Status On-going    Target Date 12/19/21      PT LONG TERM GOAL #4   Title Will be compliant with appropriate gym based program such as silver sneakers in order to maintain functional gains and reduce risk of backslide of physical gains from PT    Time 8    Period Weeks    Status New                   Plan - 12/12/21 1025     Clinical Impression Statement Ms Bayless arrives doing well, continues to have pain in L lateral proximal mm groups. We continued combination of DN and IASTM of this area (main focus of session today as this was her main concern), otherwise worked on Hotel manager for remainder of session. Seems to be doing well overall, will need reassess/recert on 2/7 if we choose to/are justified in continuing based on objective measures at that point.    Personal Factors and Comorbidities Age;Behavior Pattern;Social Background;Fitness;Time since onset of injury/illness/exacerbation;Past/Current Experience    Examination-Activity Limitations Squat;Bed Mobility;Lift;Stairs;Bend;Locomotion Level;Stand;Reach Overhead;Carry;Transfers;Sit;Sleep    Examination-Participation Restrictions Church;Cleaning;Shop;Community Activity;Volunteer;Meal Prep;Yard Work    Biomedical scientist Low    Rehab Potential Good    PT Frequency 2x / week    PT Duration 2 weeks    PT Treatment/Interventions ADLs/Self Care Home Management;Cryotherapy;Electrical Stimulation;Iontophoresis 4mg /ml Dexamethasone;Moist Heat;Ultrasound;DME Instruction;Gait training;Stair training;Functional mobility training;Therapeutic activities;Therapeutic exercise;Balance training;Neuromuscular re-education;Patient/family education;Manual techniques;Passive range of motion;Dry needling;Energy  conservation    PT Next Visit Plan RE-assess 2/7. Assess [place] to treatment activities including stetch and DN.    PT Home Exercise Plan Access Code: [email or web address]    Consulted and Agree with Plan of Care Patient             Patient will benefit from skilled therapeutic intervention in order to improve the following deficits and impairments:  Abnormal gait, Decreased coordination, Difficulty walking, Cardiopulmonary status limiting activity, Decreased endurance, Decreased safety awareness, Decreased activity tolerance, Pain, Decreased balance, Impaired flexibility, Improper body mechanics, Decreased mobility, Decreased strength, Postural dysfunction  Visit Diagnosis: Muscle weakness (generalized)  Abnormal posture  Unsteadiness on feet  Chronic bilateral low back pain without sciatica     Problem List Patient Active Problem List   Diagnosis Date Noted   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/09/2021   Pneumonia due to COVID-19 virus 07/01/2020   Macrocytic anemia 07/01/2020   Thrombocytopenia ([place]) 07/01/2020   Acute hypoxemic respiratory failure due to COVID-19 ([place]) 07/01/2020   Cellulitis of left lower extremity 04/30/2017   Cellulitis 04/30/2017   Edema of left lower extremity 04/23/2017   AKI (acute kidney injury) ([place]) 04/23/2017   ARF (acute renal failure) ([place]) 04/22/2017   DVT of lower extremity, bilateral ([place]) 02/10/2015   PE (pulmonary embolism)    Shortness of breath    Normocytic anemia 02/07/2015   DVT (deep venous thrombosis) (HCC)    Acute pulmonary embolism ([place]) 02/06/2015   Scoliosis of thoracic spine 02/06/2015   GERD (gastroesophageal reflux disease) 02/06/2015   Acute respiratory failure with hypoxia ([place]) 02/06/2015   Acute kidney injury ([place]) 02/06/2015   Elevated troponin 02/06/2015   Ann Lions PT, DPT, PN2   Supplemental Physical Therapist [facility]  [place]. [place], [place], [address] Phone: [phone]   Fax:  [phone]  Name: CHENEL WERNLI MRN: [id number] Date of Birth: 08/11/1941

## 2021-12-15 ENCOUNTER — Ambulatory Visit: Payer: Medicare Other | Attending: Physical Medicine & Rehabilitation | Admitting: Physical Therapy

## 2021-12-15 ENCOUNTER — Other Ambulatory Visit: Payer: Self-pay

## 2021-12-15 ENCOUNTER — Encounter: Payer: Self-pay | Admitting: Physical Therapy

## 2021-12-15 DIAGNOSIS — M6281 Muscle weakness (generalized): Secondary | ICD-10-CM | POA: Insufficient documentation

## 2021-12-15 DIAGNOSIS — R293 Abnormal posture: Secondary | ICD-10-CM | POA: Insufficient documentation

## 2021-12-15 DIAGNOSIS — M545 Low back pain, unspecified: Secondary | ICD-10-CM | POA: Insufficient documentation

## 2021-12-15 DIAGNOSIS — R2681 Unsteadiness on feet: Secondary | ICD-10-CM | POA: Diagnosis present

## 2021-12-15 DIAGNOSIS — G8929 Other chronic pain: Secondary | ICD-10-CM | POA: Diagnosis present

## 2021-12-15 NOTE — Therapy (Signed)
[place] [place], [place], [address] Phone: [phone]   Fax:  [phone]  Physical Therapy Treatment  Patient Details  Name: [name] May MRN: [id number] Date of Birth: 1941-08-15 Referring Provider (PT): Alysia Penna   Encounter Date: 12/15/2021   PT End of Session - 12/15/21 1013     Visit Number 16    Number of Visits 17    Date for PT Re-Evaluation 12/19/21    Authorization Type UHC MCR    Authorization Time Period 10/10/21 to 12/05/21; extended to 2/7    PT Start Time 0940    PT Stop Time 1015    PT Time Calculation (min) 35 min    Activity Tolerance Patient tolerated treatment well    Behavior During Therapy [facility] for tasks assessed/performed             Past Medical History:  Diagnosis Date   Allergy    Anemia    Arthritis    left knee   Back pain    Bipolar disorder (HCC)    Chronic headaches    COPD (chronic obstructive pulmonary disease) ([place])    Degenerative disorder of bone    Depression    Esophageal ulcer    history   GERD (gastroesophageal reflux disease)    Hyperlipidemia    Peripheral vascular disease ([place])    Pulmonary embolism ([place])    Scoliosis     Past Surgical History:  Procedure Laterality Date   ABDOMINAL HYSTERECTOMY     ANKLE SURGERY     COLONOSCOPY     EYE SURGERY     caratacs   FEMORAL ARTERY EXPLORATION  05/16/2017   Procedure: STENT OF BILATERAL COMMON FEMORAL VEIN WITH 16 X 90 WALL STENT;  Surgeon: Waynetta Sandy, MD;  Location: [place];  Service: Vascular;;   hemroidectomy     INSERTION OF ILIAC STENT Bilateral 05/16/2017   Procedure: STENT OF BILATERAL COMMON AND EXTERNAL ILIAC VEIN WITH 18 X 90 WALL STENT;  Surgeon: Waynetta Sandy, MD;  Location: [place];  Service: Vascular;  Laterality: Bilateral;   INTRAVASCULAR ULTRASOUND/IVUS N/A 05/06/2017   Procedure: INTRAVASCULAR ULTRASOUND/IVUS;  Surgeon: Waynetta Sandy, MD;   Location: [place];  Service: Vascular;  Laterality: N/A;   IVC FILTER INSERTION N/A 05/16/2017   Procedure: MECHANICAL THROMBECTOMY OF IVC FILTER;  Surgeon: Waynetta Sandy, MD;  Location: [place];  Service: Vascular;  Laterality: N/A;   LAMINECTOMY     LOWER EXTREMITY VENOGRAPHY N/A 05/14/2017   Procedure: Lower Extremity Venography;  Surgeon: Serafina Mitchell, MD;  Location: [place] CV LAB;  Service: Cardiovascular;  Laterality: N/A;   NASAL SINUS SURGERY     PERCUTANEOUS VENOUS THROMBECTOMY,LYSIS WITH INTRAVASCULAR ULTRASOUND (IVUS) N/A 05/16/2017   Procedure: PERCUTANEOUS VENOUS THROMBECTOMY AND LYSIS WITH INTRAVASCULAR ULTRASOUND (IVUS);  Surgeon: Waynetta Sandy, MD;  Location: [place];  Service: Vascular;  Laterality: N/A;   VENA CAVA FILTER PLACEMENT N/A 05/16/2017   Procedure: RETRIEVAL INFERIOR VENA-CAVA FILTER;  Surgeon: Waynetta Sandy, MD;  Location: [place];  Service: Vascular;  Laterality: N/A;   VENOGRAM Bilateral 05/16/2017   Procedure: VENOGRAM OF IVC;  Surgeon: Waynetta Sandy, MD;  Location: [place];  Service: Vascular;  Laterality: Bilateral;    There were no vitals filed for this visit.   Subjective Assessment - 12/15/21 0944     Subjective Patient reports that she bought a seated stepper, but has not put it  together yet. She reports the DN was painful while provided. She felt a lot more burning, tingling afterwards. it is improving, but still present.    Pertinent History PVD, scoliosi, GERD, osteoporosis, osteopenia    Currently in Pain? Yes    Pain Score 5     Pain Location Leg    Pain Orientation Right;Lateral;Proximal    Pain Descriptors / Indicators Burning;Tingling    Pain Type Chronic pain    Pain Onset More than a month ago    Pain Frequency Constant    Aggravating Factors  DN caused significantly increased pain. She is feeling some more relief and the leg feels slightly more loose today.                                [facility] Adult PT Treatment/Exercise - 12/15/21 0001       Lumbar Exercises: Aerobic   Nustep L5 x 5 minutes.      Lumbar Exercises: Seated   Other Seated Lumbar Exercises Pelvic clocks in sitting. 5 x between 12/6 and 3/9      Knee/Hip Exercises: Standing   Other Standing Knee Exercises Alternately tapping each foot on 6", 10 reps each, using straight cane.      Knee/Hip Exercises: Seated   Sit to Sand 1 set;10 reps;without UE support;Other (comment)   red theraband around B knees to engage abductors.                    PT Education - 12/15/21 1013     Education Details Plan for D/C on next visit. Will update HEP.    Person(s) Educated Patient    Comprehension Verbalized understanding              PT Short Term Goals - 11/24/21 1043       PT SHORT TERM GOAL #4   Title Will be able to complete bed mobility and functional lifting with good mechanics in order to protect back and reduce further risk of additional compression fractures    Status Achieved               PT Long Term Goals - 11/21/21 1129       PT LONG TERM GOAL #1   Title MMT will improve by at least one grade in core and all other weak groups in order to reduce pain/improve function    Baseline LLE 4-/5, RLE 4/5    Time 4    Period Weeks    Status On-going    Target Date 12/05/21      PT LONG TERM GOAL #2   Title Will score at least 48 on the Berg in order to show reduced fall risk    Baseline 47/56    Time 4    Period Weeks    Status On-going    Target Date 12/19/21      PT LONG TERM GOAL #3   Title Will be able to ambulate community distances with [facility] and no unsteadiness in order to improve community access with LRAD    Baseline She does minimal community walking, but limited in distance    Time 4    Period Weeks    Status On-going    Target Date 12/19/21      PT LONG TERM GOAL #4   Title Will be compliant with appropriate gym based  program such as silver sneakers in order to  maintain functional gains and reduce risk of backslide of physical gains from PT    Time 8    Period Weeks    Status New                   Plan - 12/15/21 5284     Clinical Impression Statement Patient arrived late for appointment due to her ride running late. She reports that her L outer thigh was severely painful after her last DN, burning and tingling. It is improving, but still painful. The ITB was softer today and she reports she can tell.    Personal Factors and Comorbidities Age;Behavior Pattern;Social Background;Fitness;Time since onset of injury/illness/exacerbation;Past/Current Experience    Examination-Activity Limitations Squat;Bed Mobility;Lift;Stairs;Bend;Locomotion Level;Stand;Reach Overhead;Carry;Transfers;Sit;Sleep    Examination-Participation Restrictions Church;Cleaning;Shop;Community Activity;Volunteer;Meal Prep;Yard Work    [employer] Evolving/Moderate complexity    Rehab Potential Good    PT Frequency 2x / week    PT Duration 2 weeks    PT Treatment/Interventions ADLs/Self Care Home Management;Cryotherapy;Electrical Stimulation;Iontophoresis 4mg /ml Dexamethasone;Moist Heat;Ultrasound;DME Instruction;Gait training;Stair training;Functional mobility training;Therapeutic activities;Therapeutic exercise;Balance training;Neuromuscular re-education;Patient/family education;Manual techniques;Passive range of motion;Dry needling;Energy conservation    PT Next Visit Plan RE-assess 2/7.    PT Home Exercise Plan Access Code: [email or web address]    Consulted and Agree with Plan of Care Patient             Patient will benefit from skilled therapeutic intervention in order to improve the following deficits and impairments:  Abnormal gait, Decreased coordination, Difficulty walking, Cardiopulmonary status limiting activity, Decreased endurance, Decreased safety awareness, Decreased activity tolerance, Pain,  Decreased balance, Impaired flexibility, Improper body mechanics, Decreased mobility, Decreased strength, Postural dysfunction  Visit Diagnosis: Muscle weakness (generalized)  Abnormal posture  Unsteadiness on feet  Chronic bilateral low back pain without sciatica     Problem List Patient Active Problem List   Diagnosis Date Noted   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/09/2021   Pneumonia due to COVID-19 virus 07/01/2020   Macrocytic anemia 07/01/2020   Thrombocytopenia ([place]) 07/01/2020   Acute hypoxemic respiratory failure due to COVID-19 ([place]) 07/01/2020   Cellulitis of left lower extremity 04/30/2017   Cellulitis 04/30/2017   Edema of left lower extremity 04/23/2017   AKI (acute kidney injury) ([place]) 04/23/2017   ARF (acute renal failure) ([place]) 04/22/2017   DVT of lower extremity, bilateral ([place]) 02/10/2015   PE (pulmonary embolism)    Shortness of breath    Normocytic anemia 02/07/2015   DVT (deep venous thrombosis) (HCC)    Acute pulmonary embolism ([place]) 02/06/2015   Scoliosis of thoracic spine 02/06/2015   GERD (gastroesophageal reflux disease) 02/06/2015   Acute respiratory failure with hypoxia ([place]) 02/06/2015   Acute kidney injury ([place]) 02/06/2015   Elevated troponin 02/06/2015    Marcelina Morel, DPT 12/15/2021, 11:49 AM  [place]. [place], [place], [address] Phone: [phone]   Fax:  [phone]  Name: TERESSA MCGLOCKLIN MRN: [id number] Date of Birth: 1940/12/28

## 2021-12-19 ENCOUNTER — Other Ambulatory Visit: Payer: Self-pay

## 2021-12-19 ENCOUNTER — Ambulatory Visit: Payer: Medicare Other | Admitting: Physical Therapy

## 2021-12-19 ENCOUNTER — Encounter: Payer: Self-pay | Admitting: Physical Therapy

## 2021-12-19 DIAGNOSIS — M6281 Muscle weakness (generalized): Secondary | ICD-10-CM | POA: Diagnosis not present

## 2021-12-19 DIAGNOSIS — R2681 Unsteadiness on feet: Secondary | ICD-10-CM

## 2021-12-19 DIAGNOSIS — M545 Low back pain, unspecified: Secondary | ICD-10-CM

## 2021-12-19 DIAGNOSIS — R293 Abnormal posture: Secondary | ICD-10-CM

## 2021-12-19 DIAGNOSIS — G8929 Other chronic pain: Secondary | ICD-10-CM

## 2021-12-19 NOTE — Therapy (Signed)
[place]. [place], [place], [address] Phone: [phone]   Fax:  [phone]  Physical Therapy Treatment PHYSICAL THERAPY DISCHARGE SUMMARY  Visits from Start of Care: 17  Current functional level related to goals / functional outcomes: See objective assessment data.   Remaining deficits: Weakness, gait abnormalities.   Education / Equipment: HEP   Patient agrees to discharge. Patient goals were partially met. Patient is being discharged due to maximized rehab potential.   Patient Details  Name: [name] May MRN: [id number] Date of Birth: [birth year] Referring Provider (PT): Alysia Penna   Encounter Date: 12/19/2021   PT End of Session - 12/19/21 1203     Visit Number 17    Number of Visits 17    Date for PT Re-Evaluation 12/19/21    Authorization Type UHC MCR    Authorization Time Period 10/10/21 to 12/05/21; extended to 2/7    PT Start Time 0929    PT Stop Time 1012    PT Time Calculation (min) 43 min    Activity Tolerance Patient tolerated treatment well    Behavior During Therapy WFL for tasks assessed/performed             Past Medical History:  Diagnosis Date   Allergy    Anemia    Arthritis    left knee   Back pain    Bipolar disorder (HCC)    Chronic headaches    COPD (chronic obstructive pulmonary disease) ([place])    Degenerative disorder of bone    Depression    Esophageal ulcer    history   GERD (gastroesophageal reflux disease)    Hyperlipidemia    Peripheral vascular disease ([place])    Pulmonary embolism ([place])    Scoliosis     Past Surgical History:  Procedure Laterality Date   ABDOMINAL HYSTERECTOMY     ANKLE SURGERY     COLONOSCOPY     EYE SURGERY     caratacs   FEMORAL ARTERY EXPLORATION  05/16/2017   Procedure: STENT OF BILATERAL COMMON FEMORAL VEIN WITH 16 X 90 WALL STENT;  Surgeon: Waynetta Sandy, MD;  Location: [place];  Service: Vascular;;    hemroidectomy     INSERTION OF ILIAC STENT Bilateral 05/16/2017   Procedure: STENT OF BILATERAL COMMON AND EXTERNAL ILIAC VEIN WITH 18 X 90 WALL STENT;  Surgeon: Waynetta Sandy, MD;  Location: [place];  Service: Vascular;  Laterality: Bilateral;   INTRAVASCULAR ULTRASOUND/IVUS N/A 05/06/2017   Procedure: INTRAVASCULAR ULTRASOUND/IVUS;  Surgeon: Waynetta Sandy, MD;  Location: [place];  Service: Vascular;  Laterality: N/A;   IVC FILTER INSERTION N/A 05/16/2017   Procedure: MECHANICAL THROMBECTOMY OF IVC FILTER;  Surgeon: Waynetta Sandy, MD;  Location: [place];  Service: Vascular;  Laterality: N/A;   LAMINECTOMY     LOWER EXTREMITY VENOGRAPHY N/A 05/14/2017   Procedure: Lower Extremity Venography;  Surgeon: Serafina Mitchell, MD;  Location: [place] CV LAB;  Service: Cardiovascular;  Laterality: N/A;   NASAL SINUS SURGERY     PERCUTANEOUS VENOUS THROMBECTOMY,LYSIS WITH INTRAVASCULAR ULTRASOUND (IVUS) N/A 05/16/2017   Procedure: PERCUTANEOUS VENOUS THROMBECTOMY AND LYSIS WITH INTRAVASCULAR ULTRASOUND (IVUS);  Surgeon: Waynetta Sandy, MD;  Location: [place];  Service: Vascular;  Laterality: N/A;   VENA CAVA FILTER PLACEMENT N/A 05/16/2017   Procedure: RETRIEVAL INFERIOR VENA-CAVA FILTER;  Surgeon: Waynetta Sandy, MD;  Location: [place];  Service: Vascular;  Laterality: N/A;   VENOGRAM Bilateral  05/16/2017   Procedure: VENOGRAM OF IVC;  Surgeon: Waynetta Sandy, MD;  Location: [place];  Service: Vascular;  Laterality: Bilateral;    There were no vitals filed for this visit.   Subjective Assessment - 12/19/21 0932     Subjective Patient reports the leg still feels more limber since she received the DN.                [facility] PT Assessment - 12/19/21 0001       Berg Balance Test   Sit to Stand Able to stand without using hands and stabilize independently    Standing Unsupported Able to stand safely 2 minutes    Sitting with Back Unsupported but  Feet Supported on Floor or Stool Able to sit safely and securely 2 minutes    Stand to Sit Sits safely with minimal use of hands    Transfers Able to transfer safely, minor use of hands    Standing Unsupported with Eyes Closed Able to stand 10 seconds with supervision    Standing Unsupported with Feet Together Able to place feet together independently and stand 1 minute safely    From Standing, Reach Forward with Outstretched Arm Can reach forward >12 cm safely (5")    From Standing Position, Pick up Object from Floor Able to pick up shoe safely and easily    From Standing Position, Turn to Look Behind Over each Shoulder Looks behind from both sides and weight shifts well    Turn 360 Degrees Able to turn 360 degrees safely but slowly    Standing Unsupported, Alternately Place Feet on Step/Stool Able to stand independently and complete 8 steps >20 seconds    Standing Unsupported, One Foot in Front Able to plae foot ahead of the other independently and hold 30 seconds    Standing on One Leg Able to lift leg independently and hold > 10 seconds    Total Score 50                           [facility] Adult PT Treatment/Exercise - 12/19/21 0001       Knee/Hip Exercises: Standing   Knee Flexion Strengthening;Both;1 set;10 reps    Hip Abduction Both;1 set;Stengthening;10 reps    Hip Extension Stengthening;Both;1 set;10 reps      Knee/Hip Exercises: Seated   Long Arc Quad Strengthening;Both;1 set;10 reps    Long Arc Quad Limitations Yellow                     PT Education - 12/19/21 1012     Education Details Re-assessment findings explained, updated HEP.    Person(s) Educated Patient    Methods Handout;Demonstration;Explanation    Comprehension Verbalized understanding;Returned demonstration              PT Short Term Goals - 11/24/21 1043       PT SHORT TERM GOAL #4   Title Will be able to complete bed mobility and functional lifting with good mechanics in  order to protect back and reduce further risk of additional compression fractures    Status Achieved               PT Long Term Goals - 12/19/21 0933       PT LONG TERM GOAL #1   Title MMT will improve by at least one grade in core and all other weak groups in order to reduce pain/improve function  Baseline LLE 4-/5, RLE 4/5    Time 4    Period Weeks    Status Not Met    Target Date 12/05/21      PT LONG TERM GOAL #2   Title Will score at least 48 on the Berg in order to show reduced fall risk    Baseline 50/56    Time 4    Period Weeks    Status Achieved    Target Date 12/19/21      PT LONG TERM GOAL #3   Title Will be able to ambulate community distances with [facility] and no unsteadiness in order to improve community access with LRAD    Baseline Able to go shopping, uses a buggy. Walked in her neighborhood with her [place].    Time 4    Period Weeks    Status Partially Met    Target Date 12/19/21      PT LONG TERM GOAL #4   Title Will be compliant with appropriate gym based program such as silver sneakers in order to maintain functional gains and reduce risk of backslide of physical gains from PT    Baseline Patient has purchased a Nu-Step like machine for exercise at home.    Time 8    Period Weeks    Status Partially Met                   Plan - 12/19/21 1010     Clinical Impression Statement Patient reports continued relief from LITB tightness. Performed functional re-assessment for D/C. HEP updated and patient returned demonstration. All questions answered.    Examination-Activity Limitations Squat;Bed Mobility;Lift;Stairs;Bend;Locomotion Level;Stand;Reach Overhead;Carry;Transfers;Sit;Sleep    Examination-Participation Restrictions Church;Cleaning;Shop;Community Activity;Volunteer;Meal Prep;Yard Work    Stability/Clinical Decision Making Evolving/Moderate complexity    Clinical Decision Making Low    Rehab Potential Good    PT Frequency 2x / week     PT Treatment/Interventions ADLs/Self Care Home Management;Cryotherapy;Electrical Stimulation;Iontophoresis 4mg /ml Dexamethasone;Moist Heat;Ultrasound;DME Instruction;Gait training;Stair training;Functional mobility training;Therapeutic activities;Therapeutic exercise;Balance training;Neuromuscular re-education;Patient/family education;Manual techniques;Passive range of motion;Dry needling;Energy conservation    PT Home Exercise Plan Access Code: [email or web address]    Consulted and Agree with Plan of Care Patient             Patient will benefit from skilled therapeutic intervention in order to improve the following deficits and impairments:  Abnormal gait, Decreased coordination, Difficulty walking, Cardiopulmonary status limiting activity, Decreased endurance, Decreased safety awareness, Decreased activity tolerance, Pain, Decreased balance, Impaired flexibility, Improper body mechanics, Decreased mobility, Decreased strength, Postural dysfunction  Visit Diagnosis: Muscle weakness (generalized)  Abnormal posture  Unsteadiness on feet  Chronic bilateral low back pain without sciatica     Problem List Patient Active Problem List   Diagnosis Date Noted   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/09/2021   Pneumonia due to COVID-19 virus 07/01/2020   Macrocytic anemia 07/01/2020   Thrombocytopenia ([place]) 07/01/2020   Acute hypoxemic respiratory failure due to COVID-19 ([place]) 07/01/2020   Cellulitis of left lower extremity 04/30/2017   Cellulitis 04/30/2017   Edema of left lower extremity 04/23/2017   AKI (acute kidney injury) ([place]) 04/23/2017   ARF (acute renal failure) ([place]) 04/22/2017   DVT of lower extremity, bilateral ([place]) 02/10/2015   PE (pulmonary embolism)    Shortness of breath    Normocytic anemia 02/07/2015   DVT (deep venous thrombosis) (HCC)    Acute pulmonary embolism ([place]) 02/06/2015   Scoliosis of thoracic spine 02/06/2015   GERD (gastroesophageal reflux disease)  02/06/2015  Acute respiratory failure with hypoxia ([place]) 02/06/2015   Acute kidney injury ([place]) 02/06/2015   Elevated troponin 02/06/2015    Marcelina Morel, DPT 12/19/2021, 12:06 PM  [place]. [place], [place], [address] Phone: [phone]   Fax:  [phone]  Name: ZYANN MABRY MRN: [id number] Date of Birth: [birth year]

## 2021-12-19 NOTE — Patient Instructions (Signed)
Access Code: [email or web address] URL: [email or web address] Date: 12/19/2021 Prepared by: Ethel Rana  Exercises Seated Long Arc Quad - 1 x daily - 7 x weekly - 1 sets - 10 reps Seated Hip Abduction - 1 x daily - 7 x weekly - 1 sets - 10 reps Seated Heel Toe Raises - 1 x daily - 7 x weekly - 1 sets - 10 reps Seated Marching with Opposite Shoulder Flexion - 1 x daily - 7 x weekly - 1 sets - 10 reps Seated Scapular Retraction - 1 x daily - 7 x weekly - 1 sets - 10 reps - 3 hold Seated Backward Shoulder Rolls - 1 x daily - 7 x weekly - 1 sets - 10 reps - 1 hold Seated Pelvic Clock - 1 x daily - 7 x weekly - 1 sets - 10 reps Seated ITB Stretch - 1 x daily - 7 x weekly - 3 reps - 20 hold Sit to Stand - 1 x daily - 7 x weekly - 1 sets - 10 reps Standing Hip Abduction with Counter Support - 1 x daily - 7 x weekly - 2 sets - 10 reps Standing Hip Extension with Counter Support - 1 x daily - 7 x weekly - 2 sets - 10 reps Standing March with Counter Support - 1 x daily - 7 x weekly - 2 sets - 10 reps

## 2021-12-21 ENCOUNTER — Encounter: Payer: Commercial Managed Care - HMO | Admitting: Physical Therapy

## 2021-12-22 ENCOUNTER — Ambulatory Visit: Payer: Medicare Other | Admitting: Physical Therapy

## 2021-12-26 ENCOUNTER — Other Ambulatory Visit: Payer: Self-pay

## 2021-12-26 ENCOUNTER — Encounter: Payer: Self-pay | Admitting: Registered Nurse

## 2021-12-26 ENCOUNTER — Encounter: Payer: Medicare Other | Attending: Physical Medicine & Rehabilitation | Admitting: Registered Nurse

## 2021-12-26 VITALS — BP 141/89 | HR 83 | Ht <= 58 in | Wt 141.8 lb

## 2021-12-26 DIAGNOSIS — G894 Chronic pain syndrome: Secondary | ICD-10-CM | POA: Insufficient documentation

## 2021-12-26 DIAGNOSIS — M47817 Spondylosis without myelopathy or radiculopathy, lumbosacral region: Secondary | ICD-10-CM | POA: Insufficient documentation

## 2021-12-26 DIAGNOSIS — M25562 Pain in left knee: Secondary | ICD-10-CM | POA: Insufficient documentation

## 2021-12-26 DIAGNOSIS — G8929 Other chronic pain: Secondary | ICD-10-CM | POA: Diagnosis present

## 2021-12-26 DIAGNOSIS — Z5181 Encounter for therapeutic drug level monitoring: Secondary | ICD-10-CM | POA: Insufficient documentation

## 2021-12-26 DIAGNOSIS — Z79891 Long term (current) use of opiate analgesic: Secondary | ICD-10-CM | POA: Diagnosis present

## 2021-12-26 DIAGNOSIS — M25561 Pain in right knee: Secondary | ICD-10-CM | POA: Diagnosis present

## 2021-12-26 MED ORDER — HYDROCODONE-ACETAMINOPHEN 10-325 MG PO TABS: 0.5000 | ORAL_TABLET | Freq: Two times a day (BID) | ORAL | 0 refills | Status: DC | PRN

## 2021-12-26 NOTE — Progress Notes (Signed)
Subjective:    Patient ID: [name] May, female    DOB: 16-Aug-1941, 81 y.o.   MRN: [id number]  HPI: [name] May is a 81 y.o. female who returns for follow up appointment for chronic pain and medication refill. She states her  pain is located in her lower back, and bilateral knee pain. L>R. She rates pain 7. Her current exercise regime is walking and performing stretching exercises.  Ms. Mcdaris Morphine equivalent is 14.00  MME. She is also prescribed Lorazepam  by Dr. Casimiro Needle .We have discussed the black box warning of using opioids and benzodiazepines. I highlighted the dangers of using these drugs together and discussed the adverse events including respiratory suppression, overdose, cognitive impairment and importance of compliance with current regimen. We will continue to monitor and adjust as indicated.  she is being closely monitored and under the care of her psychiatrist.   Last UDS was Performed on 11/28/2021, it was consistent.     Pain Inventory Average Pain 3 Pain Right Now 7 My pain is constant and burning  In the last 24 hours, has pain interfered with the following? General activity 4 Relation with others 5 Enjoyment of life 5 What TIME of day is your pain at its worst? evening and night Sleep (in general) Fair  Pain is worse with: inactivity Pain improves with: rest, heat/ice, therapy/exercise, pacing activities, and medication Relief from Meds: 7  Family History  Problem Relation Age of Onset   Deep vein thrombosis Neg Hx    Social History   Socioeconomic History   Marital status: Widowed    Spouse name: Not on file   Number of children: Not on file   Years of education: Not on file   Highest education level: Not on file  Occupational History   Not on file  Tobacco Use   Smoking status: Former   Smokeless tobacco: Never   Tobacco comments:    quit 25 years ago   Vaping Use   Vaping Use: Never used  Substance and Sexual Activity   Alcohol  use: No   Drug use: No   Sexual activity: Not Currently  Other Topics Concern   Not on file  Social History Narrative   Not on file   Social Determinants of Health   Financial Resource Strain: Not on file  Food Insecurity: Not on file  Transportation Needs: Not on file  Physical Activity: Not on file  Stress: Not on file  Social Connections: Not on file   Past Surgical History:  Procedure Laterality Date   ABDOMINAL HYSTERECTOMY     [place]  05/16/2017   Procedure: STENT OF BILATERAL COMMON FEMORAL VEIN WITH 16 X 90 WALL STENT;  Surgeon: Waynetta Sandy, MD;  Location: [place];  Service: Vascular;;   hemroidectomy     INSERTION OF ILIAC STENT Bilateral 05/16/2017   Procedure: STENT OF BILATERAL COMMON AND EXTERNAL ILIAC VEIN WITH 18 X 90 WALL STENT;  Surgeon: Waynetta Sandy, MD;  Location: [place];  Service: Vascular;  Laterality: Bilateral;   INTRAVASCULAR ULTRASOUND/IVUS N/A 05/06/2017   Procedure: INTRAVASCULAR ULTRASOUND/IVUS;  Surgeon: Waynetta Sandy, MD;  Location: [place];  Service: Vascular;  Laterality: N/A;   IVC FILTER INSERTION N/A 05/16/2017   Procedure: MECHANICAL THROMBECTOMY OF IVC FILTER;  Surgeon: Waynetta Sandy, MD;  Location: [place];  Service:  Vascular;  Laterality: N/A;   LAMINECTOMY     LOWER EXTREMITY VENOGRAPHY N/A 05/14/2017   Procedure: Lower Extremity Venography;  Surgeon: Serafina Mitchell, MD;  Location: [place] [facility];  Service: Cardiovascular;  Laterality: N/A;   NASAL SINUS SURGERY     PERCUTANEOUS VENOUS THROMBECTOMY,LYSIS WITH INTRAVASCULAR ULTRASOUND (IVUS) N/A 05/16/2017   Procedure: PERCUTANEOUS VENOUS THROMBECTOMY AND LYSIS WITH INTRAVASCULAR ULTRASOUND (IVUS);  Surgeon: Waynetta Sandy, MD;  Location: [place];  Service: Vascular;  Laterality: N/A;   VENA CAVA FILTER PLACEMENT N/A 05/16/2017   Procedure: RETRIEVAL INFERIOR  VENA-CAVA FILTER;  Surgeon: Waynetta Sandy, MD;  Location: [place];  Service: Vascular;  Laterality: N/A;   VENOGRAM Bilateral 05/16/2017   Procedure: VENOGRAM OF IVC;  Surgeon: Waynetta Sandy, MD;  Location: [facility];  Service: Vascular;  Laterality: Bilateral;   Past Surgical History:  Procedure Laterality Date   ABDOMINAL HYSTERECTOMY     ANKLE SURGERY     COLONOSCOPY     EYE SURGERY     caratacs   FEMORAL ARTERY EXPLORATION  05/16/2017   Procedure: STENT OF BILATERAL COMMON FEMORAL VEIN WITH 16 X 90 WALL STENT;  Surgeon: Waynetta Sandy, MD;  Location: [place];  Service: Vascular;;   hemroidectomy     INSERTION OF ILIAC STENT Bilateral 05/16/2017   Procedure: STENT OF BILATERAL COMMON AND EXTERNAL ILIAC VEIN WITH 18 X 90 WALL STENT;  Surgeon: Waynetta Sandy, MD;  Location: [place];  Service: Vascular;  Laterality: Bilateral;   INTRAVASCULAR ULTRASOUND/IVUS N/A 05/06/2017   Procedure: INTRAVASCULAR ULTRASOUND/IVUS;  Surgeon: Waynetta Sandy, MD;  Location: [place];  Service: Vascular;  Laterality: N/A;   IVC FILTER INSERTION N/A 05/16/2017   Procedure: MECHANICAL THROMBECTOMY OF IVC FILTER;  Surgeon: Waynetta Sandy, MD;  Location: [place];  Service: Vascular;  Laterality: N/A;   LAMINECTOMY     LOWER EXTREMITY VENOGRAPHY N/A 05/14/2017   Procedure: Lower Extremity Venography;  Surgeon: Serafina Mitchell, MD;  Location: [place] [facility];  Service: Cardiovascular;  Laterality: N/A;   NASAL SINUS SURGERY     PERCUTANEOUS VENOUS THROMBECTOMY,LYSIS WITH INTRAVASCULAR ULTRASOUND (IVUS) N/A 05/16/2017   Procedure: PERCUTANEOUS VENOUS THROMBECTOMY AND LYSIS WITH INTRAVASCULAR ULTRASOUND (IVUS);  Surgeon: Waynetta Sandy, MD;  Location: [place];  Service: Vascular;  Laterality: N/A;   VENA CAVA FILTER PLACEMENT N/A 05/16/2017   Procedure: RETRIEVAL INFERIOR VENA-CAVA FILTER;  Surgeon: Waynetta Sandy, MD;  Location: [place];  Service:  Vascular;  Laterality: N/A;   VENOGRAM Bilateral 05/16/2017   Procedure: VENOGRAM OF IVC;  Surgeon: Waynetta Sandy, MD;  Location: [place];  Service: Vascular;  Laterality: Bilateral;   Past Medical History:  Diagnosis Date   Allergy    Anemia    Arthritis    left knee   Back pain    Bipolar disorder (HCC)    Chronic headaches    COPD (chronic obstructive pulmonary disease) (HCC)    Degenerative disorder of bone    Depression    Esophageal ulcer    history   GERD (gastroesophageal reflux disease)    Hyperlipidemia    Peripheral vascular disease (HCC)    Pulmonary embolism (HCC)    Scoliosis    BP (!) 141/89    Pulse 83    Ht 4\' 9"  (1.448 m)    Wt 141 lb 12.8 oz (64.3 kg)    SpO2 91%    BMI 30.69 kg/m   Opioid Risk Score:  Fall Risk Score:  `1  Depression screen PHQ 2/9  Depression screen [facility] 2/9 12/26/2021 11/28/2021 10/03/2021 08/31/2021 08/10/2021 06/09/2021  Decreased Interest 0 0 0 1 1 0  Down, Depressed, Hopeless 0 0 0 1 1 1   PHQ - 2 Score 0 0 0 2 2 1   Altered sleeping - - - - - 2  Tired, decreased energy - - - - - 2  Change in appetite - - - - - 0  Feeling bad or failure about yourself  - - - - - 0  Trouble concentrating - - - - - 2  Moving slowly or fidgety/restless - - - - - 1  Suicidal thoughts - - - - - 0  PHQ-9 Score - - - - - 8     Review of Systems  Constitutional: Negative.   HENT: Negative.    Eyes: Negative.   Respiratory: Negative.    Cardiovascular: Negative.   Gastrointestinal: Negative.   Endocrine: Negative.   Genitourinary: Negative.   Musculoskeletal:  Positive for back pain.  Skin: Negative.   Allergic/Immunologic: Negative.   Neurological: Negative.   Hematological: Negative.   Psychiatric/Behavioral: Negative.        Objective:   Physical Exam Vitals and nursing note reviewed.  Constitutional:      Appearance: Normal appearance.  Cardiovascular:     Rate and Rhythm: Normal rate and regular rhythm.     Pulses: Normal  pulses.     Heart sounds: Normal heart sounds.  Pulmonary:     Effort: Pulmonary effort is normal.     Breath sounds: Normal breath sounds.  Musculoskeletal:     Cervical back: Normal range of motion and neck supple.     Comments: Normal Muscle Bulk and Muscle Testing Reveals:  Upper Extremities: Full ROM and Muscle Strength 5/5  Lumbar Paraspinal Tenderness: L-3-L-5 Lower Extremities: Decreased ROM and Muscle Strength 4/5 Arises from Table slowly Narrow Based  Gait     Skin:    General: Skin is warm and dry.  Neurological:     Mental Status: She is alert and oriented to person, place, and time.  Psychiatric:        Mood and Affect: Mood normal.        Behavior: Behavior normal.         Assessment & Plan:  Dr: Letta Pate Note was Reviewed.  1.  History of osteoporosis with multiple thoracic and lumbar compression fractures as well as bilateral rib fractures, patient is no longer symptomatic at these levels. We will continue to Monitor.   2.  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 Continue to Monitor.   3.  Bilateral knee pain: Continue HEP as Tolerated. Continue to Monitor.  4.  Chronic pain syndrome multifactorial continue hydrocodone 10 mg she takes 1/2 to 1 tablet twice a day as needed for pain #42.. We will continue the opioid monitoring program, this consists of regular clinic visits, examinations, urine drug screen, pill counts as well as use of [place] Controlled Substance Reporting system. A 12 month History has been reviewed on the [place] Controlled Substance Reporting System on 12/26/2021    5. Lumbosacral Spondylosis without Myelopathy: Continue HEP as Tolerated. Continue current medication regimen. Continue to Monitor.   F/U in 1 month with Dr Letta Pate

## 2022-01-23 ENCOUNTER — Encounter: Payer: Medicare Other | Admitting: Physical Medicine & Rehabilitation

## 2022-02-23 ENCOUNTER — Encounter: Payer: Self-pay | Admitting: Physical Medicine & Rehabilitation

## 2022-02-23 ENCOUNTER — Encounter: Payer: Medicare Other | Attending: Physical Medicine & Rehabilitation | Admitting: Physical Medicine & Rehabilitation

## 2022-02-23 VITALS — BP 125/77 | HR 95 | Ht <= 58 in | Wt 134.0 lb

## 2022-02-23 DIAGNOSIS — M47817 Spondylosis without myelopathy or radiculopathy, lumbosacral region: Secondary | ICD-10-CM | POA: Insufficient documentation

## 2022-02-23 DIAGNOSIS — G5712 Meralgia paresthetica, left lower limb: Secondary | ICD-10-CM | POA: Diagnosis present

## 2022-02-23 DIAGNOSIS — M8008XS Age-related osteoporosis with current pathological fracture, vertebra(e), sequela: Secondary | ICD-10-CM | POA: Diagnosis present

## 2022-02-23 DIAGNOSIS — M8008XA Age-related osteoporosis with current pathological fracture, vertebra(e), initial encounter for fracture: Secondary | ICD-10-CM | POA: Insufficient documentation

## 2022-02-23 MED ORDER — HYDROCODONE-ACETAMINOPHEN 10-325 MG PO TABS: 0.5000 | ORAL_TABLET | Freq: Two times a day (BID) | ORAL | 0 refills | Status: DC | PRN

## 2022-02-23 NOTE — Progress Notes (Signed)
? ?Subjective:  ? ? Patient ID: [name] May, female    DOB: Apr 18, 1941, 81 y.o.   MRN: [id number] ? ?HPI ?81 year old female with history of severe osteoporosis and chronic low back pain.  She has had multiple fractures over the years including T7 T9 and T12 compression fractures right 11th rib fracture as well as left 10th rib fracture.  She has not had any falls. ?Diagnosed with MM on Revlim[facility] Dr Cruzita Lederer no sig new pain since diagnosis ? ?Has not needed any sumatriptan since Rx in January ? ?Left lateral thigh pain is main pain, uses heating pad and 1/2 hydrocodone.   ?Averages 12 tabs per month  ?Tingling pain in the lateral thigh usually toward the evening ?Takes gabapentin daily in evening, 600 mg prescribed by Dr. Casimiro Needle ?Continues on lorazepam per Dr. Casimiro Needle, discussed interaction between hydrocodone and lorazepam. ?1 set of bilateral L3-4-5 medial branch blocks performed 08/10/2021 no recurrence of severe lumbar pain since that time ?Pain Inventory ?Average Pain 4 ?Pain Right Now 2 ?My pain is intermittent and burning ? ?In the last 24 hours, has pain interfered with the following? ?General activity 4 ?Relation with others 5 ?Enjoyment of life 5 ?What TIME of day is your pain at its worst? evening ?Sleep (in general) Good ? ?Pain is worse with: unsure ?Pain improves with: heat/ice and medication ?Relief from Meds: 9 ? ?Family History  ?Problem Relation Age of Onset  ? Deep vein thrombosis Neg Hx   ? ?Social History  ? ?Socioeconomic History  ? Marital status: Widowed  ?  Spouse name: Not on file  ? Number of children: Not on file  ? Years of education: Not on file  ? Highest education level: Not on file  ?Occupational History  ? Not on file  ?Tobacco Use  ? Smoking status: Former  ? Smokeless tobacco: Never  ? Tobacco comments:  ?  quit 25 years ago   ?Vaping Use  ? Vaping Use: Never used  ?Substance and Sexual Activity  ? Alcohol use: No  ? Drug use: No  ? Sexual activity: Not Currently  ?Other Topics  Concern  ? Not on file  ?Social History Narrative  ? Not on file  ? ?Social Determinants of Health  ? ?Financial Resource Strain: Not on file  ?Food Insecurity: Not on file  ?Transportation Needs: Not on file  ?Physical Activity: Not on file  ?Stress: Not on file  ?Social Connections: Not on file  ? ?Past Surgical History:  ?Procedure Laterality Date  ? ABDOMINAL HYSTERECTOMY    ? ANKLE SURGERY    ? COLONOSCOPY    ? EYE SURGERY    ? caratacs  ? FEMORAL ARTERY EXPLORATION  05/16/2017  ? Procedure: STENT OF BILATERAL COMMON FEMORAL VEIN WITH 16 X 90 WALL STENT;  Surgeon: Waynetta Sandy, MD;  Location: [place];  Service: Vascular;;  ? hemroidectomy    ? INSERTION OF ILIAC STENT Bilateral 05/16/2017  ? Procedure: STENT OF BILATERAL COMMON AND EXTERNAL ILIAC VEIN WITH 18 X 90 WALL STENT;  Surgeon: Waynetta Sandy, MD;  Location: [place];  Service: Vascular;  Laterality: Bilateral;  ? INTRAVASCULAR ULTRASOUND/IVUS N/A 05/06/2017  ? Procedure: INTRAVASCULAR ULTRASOUND/IVUS;  Surgeon: Waynetta Sandy, MD;  Location: [place];  Service: Vascular;  Laterality: N/A;  ? IVC FILTER INSERTION N/A 05/16/2017  ? Procedure: MECHANICAL THROMBECTOMY OF IVC FILTER;  Surgeon: Waynetta Sandy, MD;  Location: [place];  Service: Vascular;  Laterality: N/A;  ? LAMINECTOMY    ?  LOWER EXTREMITY VENOGRAPHY N/A 05/14/2017  ? Procedure: Lower Extremity Venography;  Surgeon: Serafina Mitchell, MD;  Location: [place] [facility];  Service: Cardiovascular;  Laterality: N/A;  ? NASAL SINUS SURGERY    ? PERCUTANEOUS VENOUS THROMBECTOMY,LYSIS WITH INTRAVASCULAR ULTRASOUND (IVUS) N/A 05/16/2017  ? Procedure: PERCUTANEOUS VENOUS THROMBECTOMY AND LYSIS WITH INTRAVASCULAR ULTRASOUND (IVUS);  Surgeon: Waynetta Sandy, MD;  Location: [place];  Service: Vascular;  Laterality: N/A;  ? [place] N/A 05/16/2017  ? Procedure: RETRIEVAL INFERIOR VENA-CAVA FILTER;  Surgeon: Waynetta Sandy, MD;  Location: [place];   Service: Vascular;  Laterality: N/A;  ? VENOGRAM Bilateral 05/16/2017  ? Procedure: VENOGRAM OF IVC;  Surgeon: Waynetta Sandy, MD;  Location: [place];  Service: Vascular;  Laterality: Bilateral;  ? ?Past Surgical History:  ?Procedure Laterality Date  ? ABDOMINAL HYSTERECTOMY    ? ANKLE SURGERY    ? COLONOSCOPY    ? EYE SURGERY    ? caratacs  ? FEMORAL ARTERY EXPLORATION  05/16/2017  ? Procedure: STENT OF BILATERAL COMMON FEMORAL VEIN WITH 16 X 90 WALL STENT;  Surgeon: Waynetta Sandy, MD;  Location: [place];  Service: Vascular;;  ? hemroidectomy    ? INSERTION OF ILIAC STENT Bilateral 05/16/2017  ? Procedure: STENT OF BILATERAL COMMON AND EXTERNAL ILIAC VEIN WITH 18 X 90 WALL STENT;  Surgeon: Waynetta Sandy, MD;  Location: [place];  Service: Vascular;  Laterality: Bilateral;  ? INTRAVASCULAR ULTRASOUND/IVUS N/A 05/06/2017  ? Procedure: INTRAVASCULAR ULTRASOUND/IVUS;  Surgeon: Waynetta Sandy, MD;  Location: [place];  Service: Vascular;  Laterality: N/A;  ? IVC FILTER INSERTION N/A 05/16/2017  ? Procedure: MECHANICAL THROMBECTOMY OF IVC FILTER;  Surgeon: Waynetta Sandy, MD;  Location: [place];  Service: Vascular;  Laterality: N/A;  ? LAMINECTOMY    ? LOWER EXTREMITY VENOGRAPHY N/A 05/14/2017  ? Procedure: Lower Extremity Venography;  Surgeon: Serafina Mitchell, MD;  Location: [place] [facility];  Service: Cardiovascular;  Laterality: N/A;  ? NASAL SINUS SURGERY    ? PERCUTANEOUS VENOUS THROMBECTOMY,LYSIS WITH INTRAVASCULAR ULTRASOUND (IVUS) N/A 05/16/2017  ? Procedure: PERCUTANEOUS VENOUS THROMBECTOMY AND LYSIS WITH INTRAVASCULAR ULTRASOUND (IVUS);  Surgeon: Waynetta Sandy, MD;  Location: [place];  Service: Vascular;  Laterality: N/A;  ? [place] N/A 05/16/2017  ? Procedure: RETRIEVAL INFERIOR VENA-CAVA FILTER;  Surgeon: Waynetta Sandy, MD;  Location: [place];  Service: Vascular;  Laterality: N/A;  ? VENOGRAM Bilateral 05/16/2017  ? Procedure: VENOGRAM OF  IVC;  Surgeon: Waynetta Sandy, MD;  Location: [place];  Service: Vascular;  Laterality: Bilateral;  ? ?Past Medical History:  ?Diagnosis Date  ? Allergy   ? Anemia   ? Arthritis   ? left knee  ? Back pain   ? Bipolar disorder ([place])   ? Chronic headaches   ? COPD (chronic obstructive pulmonary disease) ([place])   ? Degenerative disorder of bone   ? Depression   ? Esophageal ulcer   ? history  ? GERD (gastroesophageal reflux disease)   ? Hyperlipidemia   ? Peripheral vascular disease ([place])   ? Pulmonary embolism ([place])   ? Scoliosis   ? ?BP 125/77   Pulse 95   Ht '4\' 9"'$  (1.448 m)   Wt 134 lb (60.8 kg)   SpO2 (!) 88%   BMI 29.00 kg/m?  ? ?Opioid Risk Score:   ?Fall Risk Score:  `1 ? ?Depression screen PHQ 2/9 ? ? ?  02/23/2022  ?  12:32 PM 12/26/2021  ?  1:28 PM 11/28/2021  ?  1:56 PM 10/03/2021  ?  2:57 PM 08/31/2021  ?  9:52 AM 08/10/2021  ?  1:14 PM 06/09/2021  ? 11:24 AM  ?Depression screen PHQ 2/9  ?Decreased Interest 0 0 0 0 1 1 0  ?Down, Depressed, Hopeless 0 0 0 0 '1 1 1  '$ ?PHQ - 2 Score 0 0 0 0 '2 2 1  '$ ?Altered sleeping       2  ?Tired, decreased energy       2  ?Change in appetite       0  ?Feeling bad or failure about yourself        0  ?Trouble concentrating       2  ?Moving slowly or fidgety/restless       1  ?Suicidal thoughts       0  ?PHQ-9 Score       8  ?  ? ?Review of Systems  ?Constitutional: Negative.   ?HENT: Negative.    ?Eyes: Negative.   ?Respiratory: Negative.    ?Cardiovascular: Negative.   ?Gastrointestinal: Negative.   ?Endocrine: Negative.   ?Genitourinary: Negative.   ?Musculoskeletal:   ?     Left hip and leg pain  ?Skin: Negative.   ?Allergic/Immunologic: Negative.   ?Neurological: Negative.   ?Hematological: Negative.   ?Psychiatric/Behavioral: Negative.    ?All other systems reviewed and are negative. ? ?   ?Objective:  ? Physical Exam ?Vitals and nursing note reviewed.  ?Constitutional:   ?   Appearance: She is normal weight.  ?HENT:  ?   Head: Normocephalic and atraumatic.  ?Eyes:   ?   Extraocular Movements: Extraocular movements intact.  ?   Conjunctiva/sclera: Conjunctivae normal.  ?   Pupils: Pupils are equal, round, and reactive to light.  ?Skin: ?   General: Skin is warm and dry.

## 2022-05-24 ENCOUNTER — Emergency Department (HOSPITAL_BASED_OUTPATIENT_CLINIC_OR_DEPARTMENT_OTHER)
Admission: EM | Admit: 2022-05-24 | Discharge: 2022-05-24 | Disposition: A | Discharge status: Home / Self Care | Payer: Medicare Other | Source: Home / Self Care | Attending: Emergency Medicine | Admitting: Emergency Medicine

## 2022-05-24 ENCOUNTER — Emergency Department (HOSPITAL_BASED_OUTPATIENT_CLINIC_OR_DEPARTMENT_OTHER): Payer: Medicare Other

## 2022-05-24 ENCOUNTER — Other Ambulatory Visit: Payer: Self-pay

## 2022-05-24 ENCOUNTER — Emergency Department (HOSPITAL_BASED_OUTPATIENT_CLINIC_OR_DEPARTMENT_OTHER): Payer: Medicare Other | Admitting: Radiology

## 2022-05-24 ENCOUNTER — Encounter (HOSPITAL_BASED_OUTPATIENT_CLINIC_OR_DEPARTMENT_OTHER): Payer: Self-pay

## 2022-05-24 DIAGNOSIS — S0031XA Abrasion of nose, initial encounter: Secondary | ICD-10-CM | POA: Insufficient documentation

## 2022-05-24 DIAGNOSIS — S43401A Unspecified sprain of right shoulder joint, initial encounter: Secondary | ICD-10-CM | POA: Insufficient documentation

## 2022-05-24 DIAGNOSIS — S0990XA Unspecified injury of head, initial encounter: Secondary | ICD-10-CM | POA: Insufficient documentation

## 2022-05-24 DIAGNOSIS — S82035A Nondisplaced transverse fracture of left patella, initial encounter for closed fracture: Secondary | ICD-10-CM | POA: Diagnosis not present

## 2022-05-24 DIAGNOSIS — W19XXXA Unspecified fall, initial encounter: Secondary | ICD-10-CM

## 2022-05-24 DIAGNOSIS — M80862A Other osteoporosis with current pathological fracture, left lower leg, initial encounter for fracture: Secondary | ICD-10-CM | POA: Diagnosis not present

## 2022-05-24 DIAGNOSIS — W01198A Fall on same level from slipping, tripping and stumbling with subsequent striking against other object, initial encounter: Secondary | ICD-10-CM | POA: Insufficient documentation

## 2022-05-24 HISTORY — DX: Multiple myeloma not having achieved remission: C90.00

## 2022-05-24 MED ORDER — OXYCODONE-ACETAMINOPHEN 5-325 MG PO TABS
1.0000 | ORAL_TABLET | Freq: Once | ORAL | Status: AC
  Administered 2022-05-24: 1 via ORAL
  Filled 2022-05-24: qty 1

## 2022-05-24 NOTE — ED Triage Notes (Signed)
"  Got out of the car at the eye doctor and didn't step up high enough and fell on pavement. Now left knee, right upper arm pain, and facial abrasions where my face hit the pavement" per pt

## 2022-05-24 NOTE — ED Provider Notes (Addendum)
[facility] DEPT Provider Note   CSN: [id number] Arrival date & time: 05/24/22  1117     History  Chief Complaint  Patient presents with   [name] May is a 81 y.o. female.   Fall  Patient presents after fall.  Got up to see the doctor out of the car and tripped.  [address] and hit her face left knee and right arm.  Abrasion to bridge of nose.  No loss conscious.  Is on anticoagulation.  No neck pain.  No numbness or weakness.     Home Medications Prior to Admission medications   Medication Sig Start Date End Date Taking? Authorizing Provider  acetaminophen (TYLENOL) 650 MG CR tablet Take 1,300 mg by mouth every 8 (eight) hours as needed for pain (or headaches).    [provider]  albuterol (VENTOLIN HFA) 108 (90 Base) MCG/ACT inhaler Inhale 2 puffs into the lungs every 6 (six) hours as needed for wheezing or shortness of breath.    [provider]  apixaban (ELIQUIS) 2.5 MG TABS tablet Take 2.5 mg by mouth 2 (two) times daily.  07/24/18   [provider]  ascorbic acid (VITAMIN C) 500 MG tablet Take 500 mg by mouth daily.     [provider]  atorvastatin (LIPITOR) 10 MG tablet Take 10 mg by mouth daily. 11/28/21   [provider]  buPROPion (WELLBUTRIN XL) 300 MG 24 hr tablet Take 300 mg by mouth in the morning.  02/25/14   [provider]  Calcium Carbonate-Vitamin D 500-3.125 MG-MCG TABS Take 1 tablet by mouth daily.    [provider]  Cholecalciferol 125 MCG (5000 UT) TABS Take by mouth.    [provider]  dexamethasone (DECADRON) 4 MG tablet SMARTSIG:5 Tablet(s) By Mouth Once a Week 02/13/22   [provider]  dicyclomine (BENTYL) 20 MG tablet Take 20 mg by mouth every 6 (six) hours as needed for spasms.    [provider]  gabapentin (NEURONTIN) 600 MG tablet Take 600 mg by mouth See admin instructions. Take 600 mg by mouth at bedtime and an additional 600  mg at 6 PM, as needed/as directed 02/25/14   [provider]  HYDROcodone-acetaminophen (NORCO) 10-325 MG tablet Take 0.5-1 tablets by mouth 2 (two) times daily as needed for moderate pain. Do Not Fill Before 01/04/2022 02/23/22   Charlett Blake, MD  hydrocortisone 2.5 % cream Apply 1 application topically daily as needed (eczema).  01/06/14   [provider]  ibandronate (BONIVA) 150 MG tablet Take 150 mg by mouth every 30 (thirty) days. 01/02/22   [provider]  iron polysaccharides (NIFEREX) 150 MG capsule Take 150 mg by mouth 2 (two) times daily with a meal.     [provider]  lamoTRIgine (LAMICTAL) 200 MG tablet Take 200 mg by mouth at bedtime.  02/20/14   [provider]  lenalidomide (REVLIMID) 15 MG capsule Take by mouth. 01/29/22   [provider]  loperamide (IMODIUM A-D) 2 MG tablet Take 2 mg by mouth 4 (four) times daily as needed for diarrhea or loose stools.     [provider]  loratadine (CLARITIN) 10 MG tablet Take 10 mg by mouth daily.    [provider]  LORazepam (ATIVAN) 0.5 MG tablet Take 0.5 mg by mouth in the morning and at bedtime.     [provider]  pantoprazole (PROTONIX) 40 MG tablet Take 40 mg by  mouth 2 (two) times daily. 01/24/14   [provider]  rifaximin (XIFAXAN) 550 MG TABS tablet Take 550 mg by mouth 2 (two) times daily.    [provider]  rOPINIRole (REQUIP) 0.25 MG tablet Take 0.25 mg by mouth at bedtime. 12/21/21   [provider]  SUMAtriptan (IMITREX) 50 MG tablet Take 1 tablet (50 mg total) by mouth once as needed for migraine (and may repeat once in 2 hours, if no relief). 11/28/21   Kirsteins, Luanna Salk, MD  umeclidinium bromide (INCRUSE ELLIPTA) 62.5 MCG/INH AEPB Inhale 1 puff into the lungs every evening.    [provider]      Allergies    Latex, Mold extract [trichophyton], Molds & smuts, Vancomycin, and Cephalexin    Review of  Systems   Review of Systems  Physical Exam Updated Vital Signs BP 140/82 (BP Location: Left Arm)   Pulse 66   Temp 98.3 F (36.8 C)   Resp 17   Ht '4\' 9"'$  (1.448 m)   Wt 60.3 kg   SpO2 98%   BMI 28.78 kg/m  Physical Exam Vitals reviewed.  HENT:     Head:     Comments: Abrasion to upper bridge of nose.  Stable.  No underlying bony tenderness.  Eye movements intact. Eyes:     Pupils: Pupils are equal, round, and reactive to light.  Cardiovascular:     Rate and Rhythm: Regular rhythm.  Pulmonary:     Breath sounds: No wheezing or rhonchi.  Abdominal:     Tenderness: There is no abdominal tenderness.  Musculoskeletal:        General: Tenderness present.     Cervical back: Neck supple.     Comments: Patient states this pain to mid humerus on right, however no bony tenderness.  Mild pain to left knee but good range of motion.  No effusion.  No tenderness over hips.  Skin:    Capillary Refill: Capillary refill takes less than 2 seconds.  Neurological:     General: No focal deficit present.     Mental Status: She is alert and oriented to person, place, and time.     ED Results / Procedures / Treatments   Labs (all labs ordered are listed, but only abnormal results are displayed) Labs Reviewed - No data to display  EKG None  Radiology DG Humerus Right  Result Date: 05/24/2022 CLINICAL DATA:  Trauma, fall EXAM: RIGHT HUMERUS - 2+ VIEW COMPARISON:  None Available. FINDINGS: No fracture or dislocation is seen in right humerus. Osteopenia is seen in bony structures. There is possible deformity in right scapula. In 1 of the images, there are soft tissue calcifications between humeral head and acromion. IMPRESSION: No fracture or dislocation is seen in right humerus.  Osteopenia. Calcifications are noted in the soft tissues between acromion and humeral head suggesting calcific bursitis or tendinosis. There is possible radiolucent lesion in the medial aspect of body of right scapula.  This finding is not optimally evaluated. If clinically warranted, routine radiographs of the right scapula may be considered. Electronically Signed   By: Elmer Picker M.D.   On: 05/24/2022 12:31   DG Knee Complete 4 Views Left  Result Date: 05/24/2022 CLINICAL DATA:  Acute LEFT knee pain following fall today. Initial encounter. EXAM: LEFT KNEE - COMPLETE 4 VIEW COMPARISON:  02/23/2009 FINDINGS: There is no evidence of dislocation or definite fracture. Moderate to severe tricompartmental joint space loss and osteophytosis noted. Chondrocalcinosis is identified as  well as loose bodies within the suprapatellar bursa. No focal bony lesions are identified. There may be a very small knee effusion present. IMPRESSION: 1. No evidence of acute bony abnormality. 2. 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 Electronically Signed   By: Margarette Canada M.D.   On: 05/24/2022 12:31   CT Head Wo Contrast  Result Date: 05/24/2022 CLINICAL DATA:  Head trauma, minor (Age >= 65y) EXAM: CT HEAD WITHOUT CONTRAST TECHNIQUE: Contiguous axial images were obtained from the base of the skull through the vertex without intravenous contrast. RADIATION DOSE REDUCTION: This exam was performed according to the departmental dose-optimization program which includes automated exposure control, adjustment of the mA and/or kV according to patient size and/or use of iterative reconstruction technique. COMPARISON:  CT head 05/26/2015. FINDINGS: Brain: No evidence of acute infarction, hemorrhage, hydrocephalus, extra-axial collection or mass lesion/mass effect. 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 Cerebral atrophy. Vascular: No hyperdense vessel identified. Skull: No acute fracture. Sinuses/Orbits: Visualized sinuses are clear. No acute orbital  findings Other: No mastoid effusions. IMPRESSION: No evidence of acute intracranial abnormality. Electronically Signed   By: Margaretha Sheffield M.D.   On: 05/24/2022 12:18    Procedures Procedures    Medications Ordered in ED Medications  oxyCODONE-acetaminophen (PERCOCET/ROXICET) 5-325 MG per tablet 1 tablet (1 tablet Oral Given 05/24/22 1423)    ED Course/ Medical Decision Making/ A&P                           Medical Decision Making Amount and/or Complexity of Data Reviewed Radiology: ordered.  Risk Prescription drug management.  Differential diagnosis for fall on anticoagulation includes intracranial injury and multiple different orthopedic injuries.   Patient with fall.  Mechanical.  Hit bridge of nose.  Head CT done due to anticoagulation and was reassuring.  Independently interpreted by me.  Right humerus without fracture.  Did have potential abnormality on the scapula, however no tenderness and doubt related to this fall.  Also left knee with only degenerative changes and no fracture.  Appears stable for discharge home.  Discussed with patient about potential delayed bleed with her anticoagulation but does not appear to require admission at this time.  There is some pain with movement of the right shoulder.  Mostly active movement.  Sling given for comfort.  X-ray did not show fracture or dislocation.  No proximal bony tenderness.  Ortho follow-up as needed.  Has pain medicines at home.        Final Clinical Impression(s) / ED Diagnoses Final diagnoses:  Fall, initial encounter  Sprain of right shoulder, unspecified shoulder sprain type, initial encounter  Minor head injury, initial encounter    Rx / DC Orders ED Discharge Orders     None         Davonna Belling, MD 05/24/22 1541    Davonna Belling, MD 05/24/22 1542

## 2022-05-25 ENCOUNTER — Inpatient Hospital Stay (HOSPITAL_COMMUNITY)
Admission: EM | Admit: 2022-05-25 | Discharge: 2022-05-30 | DRG: 543 | Disposition: A | Discharge status: Skilled Nursing Facility | Payer: Medicare Other | Attending: Internal Medicine | Admitting: Internal Medicine

## 2022-05-25 ENCOUNTER — Encounter (HOSPITAL_COMMUNITY): Payer: Self-pay | Admitting: Internal Medicine

## 2022-05-25 ENCOUNTER — Emergency Department (HOSPITAL_COMMUNITY): Payer: Medicare Other

## 2022-05-25 ENCOUNTER — Other Ambulatory Visit: Payer: Self-pay

## 2022-05-25 ENCOUNTER — Encounter: Payer: Medicare Other | Admitting: Physical Medicine & Rehabilitation

## 2022-05-25 DIAGNOSIS — Z87891 Personal history of nicotine dependence: Secondary | ICD-10-CM

## 2022-05-25 DIAGNOSIS — M80862A Other osteoporosis with current pathological fracture, left lower leg, initial encounter for fracture: Principal | ICD-10-CM | POA: Diagnosis present

## 2022-05-25 DIAGNOSIS — W010XXA Fall on same level from slipping, tripping and stumbling without subsequent striking against object, initial encounter: Secondary | ICD-10-CM | POA: Diagnosis present

## 2022-05-25 DIAGNOSIS — Z91040 Latex allergy status: Secondary | ICD-10-CM

## 2022-05-25 DIAGNOSIS — M25062 Hemarthrosis, left knee: Secondary | ICD-10-CM

## 2022-05-25 DIAGNOSIS — Z66 Do not resuscitate: Secondary | ICD-10-CM | POA: Diagnosis present

## 2022-05-25 DIAGNOSIS — H3530 Unspecified macular degeneration: Secondary | ICD-10-CM | POA: Diagnosis present

## 2022-05-25 DIAGNOSIS — F419 Anxiety disorder, unspecified: Secondary | ICD-10-CM | POA: Diagnosis present

## 2022-05-25 DIAGNOSIS — C9000 Multiple myeloma not having achieved remission: Secondary | ICD-10-CM | POA: Diagnosis present

## 2022-05-25 DIAGNOSIS — J449 Chronic obstructive pulmonary disease, unspecified: Secondary | ICD-10-CM | POA: Diagnosis present

## 2022-05-25 DIAGNOSIS — Z7983 Long term (current) use of bisphosphonates: Secondary | ICD-10-CM

## 2022-05-25 DIAGNOSIS — G2581 Restless legs syndrome: Secondary | ICD-10-CM | POA: Diagnosis present

## 2022-05-25 DIAGNOSIS — W19XXXA Unspecified fall, initial encounter: Secondary | ICD-10-CM | POA: Diagnosis present

## 2022-05-25 DIAGNOSIS — Z7901 Long term (current) use of anticoagulants: Secondary | ICD-10-CM

## 2022-05-25 DIAGNOSIS — Z881 Allergy status to other antibiotic agents status: Secondary | ICD-10-CM

## 2022-05-25 DIAGNOSIS — W228XXA Striking against or struck by other objects, initial encounter: Secondary | ICD-10-CM | POA: Diagnosis present

## 2022-05-25 DIAGNOSIS — G43909 Migraine, unspecified, not intractable, without status migrainosus: Secondary | ICD-10-CM | POA: Diagnosis present

## 2022-05-25 DIAGNOSIS — J9811 Atelectasis: Secondary | ICD-10-CM | POA: Diagnosis present

## 2022-05-25 DIAGNOSIS — Y92002 Bathroom of unspecified non-institutional (private) residence single-family (private) house as the place of occurrence of the external cause: Secondary | ICD-10-CM

## 2022-05-25 DIAGNOSIS — I739 Peripheral vascular disease, unspecified: Secondary | ICD-10-CM | POA: Diagnosis present

## 2022-05-25 DIAGNOSIS — R52 Pain, unspecified: Secondary | ICD-10-CM

## 2022-05-25 DIAGNOSIS — M549 Dorsalgia, unspecified: Secondary | ICD-10-CM | POA: Diagnosis present

## 2022-05-25 DIAGNOSIS — Z86711 Personal history of pulmonary embolism: Secondary | ICD-10-CM

## 2022-05-25 DIAGNOSIS — D509 Iron deficiency anemia, unspecified: Secondary | ICD-10-CM | POA: Diagnosis present

## 2022-05-25 DIAGNOSIS — G8929 Other chronic pain: Secondary | ICD-10-CM | POA: Diagnosis present

## 2022-05-25 DIAGNOSIS — D72829 Elevated white blood cell count, unspecified: Secondary | ICD-10-CM | POA: Diagnosis not present

## 2022-05-25 DIAGNOSIS — M419 Scoliosis, unspecified: Secondary | ICD-10-CM | POA: Diagnosis present

## 2022-05-25 DIAGNOSIS — Z8709 Personal history of other diseases of the respiratory system: Secondary | ICD-10-CM

## 2022-05-25 DIAGNOSIS — Z79899 Other long term (current) drug therapy: Secondary | ICD-10-CM

## 2022-05-25 DIAGNOSIS — S43401A Unspecified sprain of right shoulder joint, initial encounter: Secondary | ICD-10-CM | POA: Diagnosis present

## 2022-05-25 DIAGNOSIS — M47817 Spondylosis without myelopathy or radiculopathy, lumbosacral region: Secondary | ICD-10-CM | POA: Diagnosis present

## 2022-05-25 DIAGNOSIS — S0031XA Abrasion of nose, initial encounter: Secondary | ICD-10-CM | POA: Diagnosis present

## 2022-05-25 DIAGNOSIS — R509 Fever, unspecified: Secondary | ICD-10-CM | POA: Diagnosis not present

## 2022-05-25 DIAGNOSIS — M5430 Sciatica, unspecified side: Secondary | ICD-10-CM | POA: Diagnosis present

## 2022-05-25 DIAGNOSIS — E785 Hyperlipidemia, unspecified: Secondary | ICD-10-CM | POA: Diagnosis present

## 2022-05-25 DIAGNOSIS — Z86718 Personal history of other venous thrombosis and embolism: Secondary | ICD-10-CM

## 2022-05-25 DIAGNOSIS — S82035A Nondisplaced transverse fracture of left patella, initial encounter for closed fracture: Secondary | ICD-10-CM

## 2022-05-25 DIAGNOSIS — M1712 Unilateral primary osteoarthritis, left knee: Secondary | ICD-10-CM | POA: Diagnosis present

## 2022-05-25 DIAGNOSIS — Z9981 Dependence on supplemental oxygen: Secondary | ICD-10-CM

## 2022-05-25 DIAGNOSIS — F319 Bipolar disorder, unspecified: Secondary | ICD-10-CM | POA: Diagnosis present

## 2022-05-25 DIAGNOSIS — K219 Gastro-esophageal reflux disease without esophagitis: Secondary | ICD-10-CM | POA: Diagnosis present

## 2022-05-25 LAB — BASIC METABOLIC PANEL
Anion gap: 8 (ref 5–15)
BUN: 18 mg/dL (ref 8–23)
CO2: 25 mmol/L (ref 22–32)
Calcium: 8.4 mg/dL — ABNORMAL LOW (ref 8.9–10.3)
Chloride: 103 mmol/L (ref 98–111)
Creatinine, Ser: 0.88 mg/dL (ref 0.44–1.00)
GFR, Estimated: >60 mL/min (ref >60)
Glucose, Bld: 120 mg/dL — ABNORMAL HIGH (ref 70–99)
Potassium: 3.5 mmol/L (ref 3.5–5.1)
Sodium: 136 mmol/L (ref 135–145)

## 2022-05-25 LAB — CBC
HCT: 37.8 % (ref 36.0–46.0)
Hemoglobin: 12.2 g/dL (ref 12.0–15.0)
MCH: 31.7 pg (ref 26.0–34.0)
MCHC: 32.3 g/dL (ref 30.0–36.0)
MCV: 98.2 fL (ref 80.0–100.0)
Platelets: 167 10*3/uL (ref 150–400)
RBC: 3.85 MIL/uL — ABNORMAL LOW (ref 3.87–5.11)
RDW: 14.4 % (ref 11.5–15.5)
WBC: 8.7 10*3/uL (ref 4.0–10.5)
nRBC: 0 % (ref 0.0–0.2)

## 2022-05-25 LAB — PROTIME-INR
INR: 1.2 (ref 0.8–1.2)
Prothrombin Time: 15.2 seconds (ref 11.4–15.2)

## 2022-05-25 LAB — APTT: aPTT: 26 seconds (ref 24–36)

## 2022-05-25 MED ORDER — HYDROCODONE-ACETAMINOPHEN 10-325 MG PO TABS
0.5000 | ORAL_TABLET | Freq: Two times a day (BID) | ORAL | Status: DC | PRN
  Administered 2022-05-25: 1 via ORAL
  Filled 2022-05-25: qty 1

## 2022-05-25 MED ORDER — ONDANSETRON HCL 4 MG/2ML IJ SOLN
4.0000 mg | Freq: Once | INTRAMUSCULAR | Status: AC
Start: 2022-05-25 — End: 2022-05-25
  Administered 2022-05-25: 4 mg via INTRAVENOUS
  Filled 2022-05-25: qty 2

## 2022-05-25 MED ORDER — HYDROCODONE-ACETAMINOPHEN 10-325 MG PO TABS
1.0000 | ORAL_TABLET | Freq: Four times a day (QID) | ORAL | Status: DC | PRN
  Administered 2022-05-25 – 2022-05-28 (×6): 1 via ORAL
  Filled 2022-05-25 (×6): qty 1

## 2022-05-25 MED ORDER — GABAPENTIN 600 MG PO TABS
300.0000 mg | ORAL_TABLET | Freq: Every day | ORAL | Status: DC
  Administered 2022-05-25 – 2022-05-29 (×5): 300 mg via ORAL
  Filled 2022-05-25 (×5): qty 1

## 2022-05-25 MED ORDER — HYDROMORPHONE HCL 1 MG/ML IJ SOLN
1.0000 mg | INTRAMUSCULAR | Status: DC | PRN
  Administered 2022-05-25 – 2022-05-29 (×4): 1 mg via INTRAVENOUS
  Filled 2022-05-25 (×6): qty 1

## 2022-05-25 MED ORDER — LAMOTRIGINE 100 MG PO TABS
200.0000 mg | ORAL_TABLET | Freq: Every day | ORAL | Status: DC
  Administered 2022-05-25 – 2022-05-29 (×5): 200 mg via ORAL
  Filled 2022-05-25 (×5): qty 2

## 2022-05-25 MED ORDER — ROPINIROLE HCL 0.5 MG PO TABS
0.2500 mg | ORAL_TABLET | Freq: Every day | ORAL | Status: DC
  Administered 2022-05-25 – 2022-05-29 (×5): 0.25 mg via ORAL
  Filled 2022-05-25 (×6): qty 1

## 2022-05-25 MED ORDER — IOHEXOL 350 MG/ML SOLN
100.0000 mL | Freq: Once | INTRAVENOUS | Status: AC | PRN
  Administered 2022-05-25: 100 mL via INTRAVENOUS

## 2022-05-25 MED ORDER — HYDROMORPHONE HCL 1 MG/ML IJ SOLN
0.5000 mg | Freq: Once | INTRAMUSCULAR | Status: AC
  Administered 2022-05-25: 0.5 mg via INTRAVENOUS
  Filled 2022-05-25: qty 1

## 2022-05-25 MED ORDER — HYDROMORPHONE HCL 1 MG/ML IJ SOLN
0.5000 mg | INTRAMUSCULAR | Status: DC | PRN
  Administered 2022-05-25: 0.5 mg via INTRAVENOUS
  Filled 2022-05-25: qty 1

## 2022-05-25 MED ORDER — BUPROPION HCL ER (XL) 150 MG PO TB24
300.0000 mg | ORAL_TABLET | Freq: Every morning | ORAL | Status: DC
  Administered 2022-05-26 – 2022-05-30 (×5): 300 mg via ORAL
  Filled 2022-05-25 (×3): qty 2
  Filled 2022-05-25: qty 1
  Filled 2022-05-25 (×2): qty 2

## 2022-05-25 MED ORDER — LOPERAMIDE HCL 2 MG PO CAPS
2.0000 mg | ORAL_CAPSULE | Freq: Four times a day (QID) | ORAL | Status: DC | PRN
Start: 2022-05-25 — End: 2022-05-30

## 2022-05-25 MED ORDER — PANTOPRAZOLE SODIUM 40 MG PO TBEC
40.0000 mg | DELAYED_RELEASE_TABLET | Freq: Every day | ORAL | Status: DC
  Administered 2022-05-26 – 2022-05-30 (×5): 40 mg via ORAL
  Filled 2022-05-25 (×5): qty 1

## 2022-05-25 MED ORDER — DEXAMETHASONE 4 MG PO TABS: 4.0000 mg | ORAL_TABLET | ORAL | Status: DC

## 2022-05-25 MED ORDER — UMECLIDINIUM BROMIDE 62.5 MCG/ACT IN AEPB
1.0000 | INHALATION_SPRAY | Freq: Every evening | RESPIRATORY_TRACT | Status: DC
Start: 2022-05-25 — End: 2022-05-30
  Administered 2022-05-26 – 2022-05-29 (×4): 1 via RESPIRATORY_TRACT
  Filled 2022-05-25: qty 7

## 2022-05-25 MED ORDER — LORAZEPAM 0.5 MG PO TABS
0.5000 mg | ORAL_TABLET | Freq: Every morning | ORAL | Status: DC
  Administered 2022-05-26 – 2022-05-30 (×5): 0.5 mg via ORAL
  Filled 2022-05-25 (×5): qty 1

## 2022-05-25 MED ORDER — ALBUTEROL SULFATE (2.5 MG/3ML) 0.083% IN NEBU
INHALATION_SOLUTION | Freq: Four times a day (QID) | RESPIRATORY_TRACT | Status: DC | PRN
Start: 2022-05-25 — End: 2022-05-30

## 2022-05-25 MED ORDER — ATORVASTATIN CALCIUM 10 MG PO TABS
10.0000 mg | ORAL_TABLET | Freq: Every day | ORAL | Status: DC
  Administered 2022-05-26 – 2022-05-30 (×5): 10 mg via ORAL
  Filled 2022-05-25 (×5): qty 1

## 2022-05-25 NOTE — ED Provider Notes (Signed)
[facility] EMERGENCY DEPARTMENT Provider Note   CSN: [id number] Arrival date & time: 05/25/22  0759     History  Chief Complaint  Patient presents with   [name] May    AVNI TRAORE is a 81 y.o. female.   Fall  Patient presents after a fall.  Actually was seen yesterday at a different location by me for a different fall.  Reportedly slid while in the bathroom today.  Stubbed her toe and landed on the left knee.  Increasing swelling of left knee although did have pain in left knee at with effusion from yesterday.  Had small effusion at that time but effusion had increased even prior to this fall.  Is on Eliquis that she took last night.  Did not hit head.  States she was feeling nauseous and pain was poorly controlled yesterday.  Also pain in left flank.  Does have some chronic pain here but this is worsened since the fall.  Unsure whether she hit that area or not.  Did not hit head.    Past Medical History:  Diagnosis Date   Allergy    Anemia    Arthritis    left knee   Back pain    Bipolar disorder (HCC)    Chronic headaches    COPD (chronic obstructive pulmonary disease) (HCC)    Degenerative disorder of bone    Depression    Esophageal ulcer    history   GERD (gastroesophageal reflux disease)    Hyperlipidemia    Myeloma ([place])    Peripheral vascular disease ([place])    Pulmonary embolism (HCC)    Scoliosis     Home Medications Prior to Admission medications   Medication Sig Start Date End Date Taking? Authorizing Provider  acetaminophen (TYLENOL) 650 MG CR tablet Take 1,300 mg by mouth every 8 (eight) hours as needed for pain (or headaches).    [provider]  albuterol (VENTOLIN HFA) 108 (90 Base) MCG/ACT inhaler Inhale 2 puffs into the lungs every 6 (six) hours as needed for wheezing or shortness of breath.    [provider]  apixaban (ELIQUIS) 2.5 MG TABS tablet Take 2.5 mg by mouth 2 (two) times daily.  07/24/18   [provider]  ascorbic acid (VITAMIN C) 500 MG tablet Take 500 mg by mouth daily.     [provider]  atorvastatin (LIPITOR) 10 MG tablet Take 10 mg by mouth daily. 11/28/21   [provider]  buPROPion (WELLBUTRIN XL) 300 MG 24 hr tablet Take 300 mg by mouth in the morning.  02/25/14   [provider]  Calcium Carbonate-Vitamin D 500-3.125 MG-MCG TABS Take 1 tablet by mouth daily.    [provider]  Cholecalciferol 125 MCG (5000 UT) TABS Take by mouth.    [provider]  dexamethasone (DECADRON) 4 MG tablet SMARTSIG:5 Tablet(s) By Mouth Once a Week 02/13/22   [provider]  dicyclomine (BENTYL) 20 MG tablet Take 20 mg by mouth every 6 (six) hours as needed for spasms.    [provider]  gabapentin (NEURONTIN) 600 MG tablet Take 600 mg by mouth See admin instructions. Take 600 mg by mouth at bedtime and an additional 600 mg at 6 PM, as needed/as directed 02/25/14   [provider]  HYDROcodone-acetaminophen (NORCO) 10-325 MG tablet Take 0.5-1 tablets by mouth 2 (two) times daily as needed for moderate pain. Do Not Fill Before 01/04/2022 02/23/22   Charlett Blake, MD  hydrocortisone 2.5 % cream Apply 1 application topically daily as needed (eczema).  01/06/14   [provider]  ibandronate (BONIVA) 150 MG tablet Take 150 mg by mouth every 30 (thirty) days. 01/02/22   [provider]  iron polysaccharides (NIFEREX) 150 MG capsule Take 150 mg by mouth 2 (two) times daily with a meal.     [provider]  lamoTRIgine (LAMICTAL) 200 MG tablet Take 200 mg by mouth at bedtime.  02/20/14   [provider]  lenalidomide (REVLIMID) 15 MG capsule Take by mouth. 01/29/22   [provider]  loperamide (IMODIUM A-D) 2 MG tablet Take 2 mg by mouth 4 (four) times daily as needed for diarrhea or loose stools.     [provider]  loratadine (CLARITIN) 10 MG tablet Take 10 mg by mouth daily.     [provider]  LORazepam (ATIVAN) 0.5 MG tablet Take 0.5 mg by mouth in the morning and at bedtime.     [provider]  pantoprazole (PROTONIX) 40 MG tablet Take 40 mg by mouth 2 (two) times daily. 01/24/14   [provider]  rifaximin (XIFAXAN) 550 MG TABS tablet Take 550 mg by mouth 2 (two) times daily.    [provider]  rOPINIRole (REQUIP) 0.25 MG tablet Take 0.25 mg by mouth at bedtime. 12/21/21   [provider]  SUMAtriptan (IMITREX) 50 MG tablet Take 1 tablet (50 mg total) by mouth once as needed for migraine (and may repeat once in 2 hours, if no relief). 11/28/21   Kirsteins, Luanna Salk, MD  umeclidinium bromide (INCRUSE ELLIPTA) 62.5 MCG/INH AEPB Inhale 1 puff into the lungs every evening.    [provider]      Allergies    Latex, Mold extract [trichophyton], Molds & smuts, Vancomycin, and Cephalexin    Review of Systems   Review of Systems  Physical Exam Updated Vital Signs BP 138/71   Pulse (!) 103   Temp 97.9 F (36.6 C) (Oral)   Resp 15   SpO2 93%  Physical Exam Vitals and nursing note reviewed.  HENT:     Head:     Comments: Skin tear from yesterday on bridge of nose. Cardiovascular:     Rate and Rhythm: Regular rhythm.  Chest:     Chest wall: No tenderness.  Abdominal:     Comments: Tenderness to left lower posterior flank.  No ecchymosis.  Musculoskeletal:        General: Tenderness present.     Cervical back: Neck supple.     Comments: Large effusion to left knee.  Decreased range of motion.  Tenderness to palpation.  Skin:    Capillary Refill: Capillary refill takes less than 2 seconds.  Neurological:     Mental Status: She is alert and oriented to person, place, and time.     ED Results / Procedures / Treatments   Labs (all labs ordered are listed, but only abnormal results are displayed) Labs Reviewed  BASIC METABOLIC PANEL - Abnormal; Notable for the following components:      Result  Value   Glucose, Bld 120 (*)    Calcium 8.4 (*)    All other components within normal limits  CBC - Abnormal; Notable for the following components:   RBC 3.85 (*)    All other components within normal limits    EKG None  Radiology CT Knee Left Wo Contrast  Result Date: 05/25/2022 CLINICAL DATA:  Left knee pain after  fall EXAM: CT OF THE LEFT KNEE WITHOUT CONTRAST TECHNIQUE: Multidetector CT imaging of the left knee was performed according to the standard protocol. Multiplanar CT image reconstructions were also generated. RADIATION DOSE REDUCTION: This exam was performed according to the departmental dose-optimization program which includes automated exposure control, adjustment of the mA and/or kV according to patient size and/or use of iterative reconstruction technique. COMPARISON:  X-ray 05/25/2022 FINDINGS: Bones/Joint/Cartilage Acute, nondisplaced, minimally comminuted fracture through the mid to inferior pole of the patella (series 7, images 45-53). Fracture is primarily transverse in orientation. Fracture extends intra-articularly to the patellofemoral joint. Bones are demineralized. No additional fractures. Moderate tricompartmental osteoarthritis with joint space narrowing, subchondral sclerosis, and marginal osteophyte formation. Chondrocalcinosis present within all 3 compartments. Scattered small intra-articular loose bodies. Moderate-sized knee joint hemarthrosis. Ligaments Suboptimally assessed by CT. Muscles and Tendons No acute musculotendinous abnormality by CT. Extensor mechanism appears intact. Soft tissues Mild soft tissue edema, most pronounced within the prepatellar region. No organized fluid collection or hematoma. IMPRESSION: 1. Acute, nondisplaced fracture through the mid to inferior pole of the patella, as described above. 2. Moderate-sized knee joint hemarthrosis. 3. Moderate tricompartmental osteoarthritis. Electronically Signed   By: Davina Poke D.O.   On: 05/25/2022  11:01   CT ABDOMEN PELVIS W CONTRAST  Result Date: 05/25/2022 CLINICAL DATA:  81 year old female with history of abdominal pain after a fall. EXAM: CT ABDOMEN AND PELVIS WITH CONTRAST TECHNIQUE: Multidetector CT imaging of the abdomen and pelvis was performed using the standard protocol following bolus administration of intravenous contrast. RADIATION DOSE REDUCTION: This exam was performed according to the departmental dose-optimization program which includes automated exposure control, adjustment of the mA and/or kV according to patient size and/or use of iterative reconstruction technique. CONTRAST:  187m OMNIPAQUE IOHEXOL 350 MG/ML SOLN COMPARISON:  CT the abdomen and pelvis 05/07/2017. FINDINGS: Lower chest: Atherosclerotic calcifications are noted in the thoracic aorta as well as the left main, left anterior descending, left circumflex and right coronary arteries. Large hiatal hernia. Extensive areas of post infectious or inflammatory scarring are noted in the lung bases, most severe in the left lower lobe. Eventration of the posterolateral aspect of the right hemidiaphragm incidentally noted. Hepatobiliary: No evidence of significant acute traumatic injury to the liver. No suspicious cystic or solid hepatic lesions. No intra or extrahepatic biliary ductal dilatation. Tiny partially calcified gallstones lying dependently in the gallbladder. Gallbladder is moderately distended. Gallbladder wall thickness is normal. No pericholecystic fluid or surrounding inflammatory changes. Pancreas: No evidence of acute traumatic injury to the pancreas. No pancreatic mass. No pancreatic ductal dilatation. No pancreatic or peripancreatic fluid collections or inflammatory changes. Spleen: No evidence of significant acute traumatic injury to the spleen. Spleen is normal in size and appearance. Adrenals/Urinary Tract: No evidence of significant acute traumatic injury to either kidney, adrenal gland or the urinary bladder.  Multiple subcentimeter low-attenuation lesions in both kidneys, too small to definitively characterize, but statistically likely to represent tiny cysts (no imaging follow-up is recommended). Bilateral adrenal glands are normal in appearance. Base of urinary bladder sits approximately 2 cm below the pubococcygeal line at rest, indicative of a cystocele. Stomach/Bowel: No definitive evidence to suggest significant acute traumatic injury to the hollow viscera. Intra-abdominal portion of the stomach is normal in appearance. No pathologic dilatation of small bowel or colon. Normal appendix. Vascular/Lymphatic: Aortic atherosclerosis, without evidence of aneurysm or dissection in the abdominal or pelvic vasculature. Stent grafts are noted in the iliac veins bilaterally. The right graft is grossly  patent. Patency of the left graft can not be confirmed on today's examination, as there is extensive endoluminal material which is partially calcified, likely to represent chronic calcified thrombus. Numerous venous collaterals are noted in the anterior pelvic wall. No lymphadenopathy noted in the abdomen or pelvis. Reproductive: Status post hysterectomy. Ovaries are not confidently identified may be surgically absent or atrophic. Other: No high attenuation fluid collection in the peritoneal cavity or retroperitoneum to suggest significant posttraumatic hemorrhage. No significant volume of ascites. No pneumoperitoneum. Musculoskeletal: Chronic compression fracture of T12 with 70% loss of anterior vertebral body height, unchanged. Dextroscoliosis of the thoracolumbar spine convex to the right near the thoracolumbar junction and to the left in the lumbar region. Multilevel degenerative disc disease. No acute displaced fractures or aggressive appearing lytic or blastic lesions are noted in the visualized portions of the skeleton. IMPRESSION: 1. No evidence of significant acute traumatic injury to the abdomen or pelvis. 2. Aortic  atherosclerosis, in addition to left main and three-vessel coronary artery disease. 3. 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 4. Large hiatal hernia. 5. Cholelithiasis without evidence of acute cholecystitis at this time. 6. Additional incidental findings, as above. Electronically Signed   By: Vinnie Langton M.D.   On: 05/25/2022 11:00   DG Knee Complete 4 Views Left  Result Date: 05/25/2022 CLINICAL DATA:  Fall EXAM: LEFT KNEE - COMPLETE 4+ VIEW COMPARISON:  05/24/2022 FINDINGS: No fracture dislocation of the femur or tibia.  No dislocation. Loose body again noted in the suprapatellar bursa. Small suprapatellar bursal effusion increased from 1 day. Chondrocalcinosis within the mediolateral compartment. IMPRESSION: 1.  No fracture or dislocation. 2. Suprapatellar joint effusion and loose body. Increased effusion from 1 day prior. 3. Chondrocalcinosis. Electronically Signed   By: Suzy Bouchard M.D.   On: 05/25/2022 08:54   DG Humerus Right  Result Date: 05/24/2022 CLINICAL DATA:  Trauma, fall EXAM: RIGHT HUMERUS - 2+ VIEW COMPARISON:  None Available. FINDINGS: No fracture or dislocation is seen in right humerus. Osteopenia is seen in bony structures. There is possible deformity in right scapula. In 1 of the images, there are soft tissue calcifications between humeral head and acromion. IMPRESSION: No fracture or dislocation is seen in right humerus.  Osteopenia. Calcifications are noted in the soft tissues between acromion and humeral head suggesting calcific bursitis or tendinosis. There is possible radiolucent lesion in the medial aspect of body of right scapula. This finding is not optimally evaluated. If clinically warranted, routine radiographs of the right scapula may be considered. Electronically Signed   By: Elmer Picker M.D.   On: 05/24/2022 12:31   DG Knee  Complete 4 Views Left  Result Date: 05/24/2022 CLINICAL DATA:  Acute LEFT knee pain following fall today. Initial encounter. EXAM: LEFT KNEE - COMPLETE 4 VIEW COMPARISON:  02/23/2009 FINDINGS: There is no evidence of dislocation or definite fracture. Moderate to severe tricompartmental joint space loss and osteophytosis noted. Chondrocalcinosis is identified as well as loose bodies within the suprapatellar bursa. No focal bony lesions are identified. There may be a very small knee effusion present. IMPRESSION: 1. No evidence of acute bony abnormality. 2. 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 Electronically Signed   By: Margarette Canada M.D.   On: 05/24/2022 12:31   CT Head Wo Contrast  Result Date: 05/24/2022 CLINICAL DATA:  Head trauma, minor (Age >= 65y) EXAM: CT HEAD WITHOUT CONTRAST TECHNIQUE: Contiguous axial images were obtained from the base of the skull through the vertex without intravenous contrast. RADIATION DOSE REDUCTION: This exam was performed according to the departmental dose-optimization program which includes automated exposure control, adjustment of the mA and/or kV according to patient size and/or use of iterative reconstruction technique. COMPARISON:  CT head 05/26/2015. FINDINGS: Brain: No evidence of acute infarction, hemorrhage, hydrocephalus, extra-axial collection or mass lesion/mass effect. 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 Cerebral atrophy. Vascular: No hyperdense vessel identified. Skull: No acute fracture. Sinuses/Orbits: Visualized sinuses are clear. No acute orbital findings Other: No mastoid effusions. IMPRESSION: No evidence of acute intracranial abnormality. Electronically Signed   By: Margaretha Sheffield M.D.   On: 05/24/2022 12:18    Procedures Procedures    Medications  Ordered in ED Medications  ondansetron (ZOFRAN) injection 4 mg (4 mg Intravenous Given 05/25/22 0857)  HYDROmorphone (DILAUDID) injection 0.5 mg (0.5 mg Intravenous Given 05/25/22 0857)  iohexol (OMNIPAQUE) 350 MG/ML injection 100 mL (100 mLs Intravenous Contrast Given 05/25/22 1008)  HYDROmorphone (DILAUDID) injection 0.5 mg (0.5 mg Intravenous Given 05/25/22 1157)    ED Course/ Medical Decision Making/ A&P                           Medical Decision Making Amount and/or Complexity of Data Reviewed Labs: ordered. Radiology: ordered.  Risk Prescription drug management.   Patient with fall.  Seen yesterday by me.  At that time no clear fracture.  Had arthritis in the knee.  However and has had worsening likely hemarthrosis since.  However had fall and hit knee today.  Will get x-ray and likely CT scan if it is negative.  Also with left flank pain.  Hit posterior flank potentially.  Has some chronic pain in this area but with anticoagulation will get CT scan to evaluate also.  CT scan of the abdomen pelvis was overall reassuring.  However CT scan of the left knee showed a transverse patellar fracture.  Nondisplaced.  Knee immobilizer is the acute treatment of this, however pain has been uncontrolled.  Has required repeated doses of Dilaudid here and is already on hydrocodone 10 mg at home.  With this uncontrolled pain I feel she would benefit for admission to the hospital for likely PT OT evaluation since she is also walking with a rollator at baseline and for pain control.  Will discuss with unassigned medicine        Final Clinical Impression(s) / ED Diagnoses Final diagnoses:  Fall, initial encounter  Closed nondisplaced transverse fracture of left patella, initial encounter  Uncontrolled pain    Rx / DC Orders ED Discharge Orders     None         Davonna Belling, MD 05/25/22 1203

## 2022-05-25 NOTE — H&P (Signed)
Date: 05/25/2022               Patient Name:  [name] May MRN: [id number]  DOB: [birth year] Age / Sex: 81 y.o., female   PCP: [name] Nova, MD         Medical Service: Internal Medicine Teaching Service         Attending Physician: Dr. Aldine Contes, MD    First Contact: Dr. Dema May Pager: [phone]  Second Contact: Dr. Allyson May Pager: [phone]       After Hours (After 5p/  First Contact Pager: [phone]  weekends / holidays): Second Contact Pager: [phone]   Chief Complaint: fall  History of Present Illness:  Evaluated patient at bedside with daughter present.  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 Patient states that she suffered a mechanical fall yesterday after tripping over a curb while going to see her eye doctor. She hit her head, right shoulder, and both knees although left knee was more tender. She came to the ED where she was evaluated with imaging. CT head showed no acute abnormality or hemorrhage, right shoulder films showed no acute fracture or dislocation, and left knee films showed no fracture but showed moderate to severe osteoarthritis. She was subsequently discharged with a sling and her home pain medications. States after discharge, she did okay at home and felt steady with ambulation with her cane. Her daughter, [name] May, came to stay with her overnight because of the fall and noted her to be somewhat weak but was relatively at her baseline.  This morning, she awoke and took her morning medications. She subsequently walked to her bathroom and her feet slipped underneath her. She suffered another fall but did not hit her head this time. She notes that she began having severe left knee pain and was unable to get up. Daughter subsequently called EMS who brought her in to [facility]. She  denies any lightheadedness, dizziness, headache, nausea, vomiting, fever, chills, chest pain, palpitations, SHOB prior to fall or currently.   Of note, she does have a recent history of multiple myeloma diagnosed in February 2023 and is followed by Hem/Onc in the outpatient setting for this. She is on Revlimid for treatment and takes it 21 days every month with a 7 day break. Her next dose would be this upcoming Monday.   Daughter also states that she has severe scoliosis with sciatica for which she takes norco 10-325 BID prn and gabapentin 647m BID. Daughter states that patient was about 5 foot 4 inches about 10 years ago and is now about 4 foot 9 inches tall. Patient also has macular degeneration, goes to an eye clinic who performed laser therapy which has somewhat helped with her vision.   PMH: Bipolar 1 Depression/Anxiety COPD MM DVT/PE x2, on eliquis Chronic pain 2/2 Scoliosis Restless Leg Syndrome Migraine GERD Osteoporosis  Meds:  Current Meds  Medication Sig   acetaminophen (TYLENOL) 650 MG CR tablet Take 1,300 mg by mouth every 8 (eight) hours as needed for pain (or headaches).   albuterol (VENTOLIN HFA) 108 (90 Base) MCG/ACT inhaler Inhale 2 puffs into the lungs every 6 (six) hours as needed for wheezing or shortness of breath.   apixaban (ELIQUIS) 2.5 MG TABS tablet Take 2.5 mg by mouth 2 (two) times daily.  ascorbic acid (VITAMIN C) 500 MG tablet Take 500 mg by mouth daily.    atorvastatin (LIPITOR) 10 MG tablet Take 10 mg by mouth daily.   buPROPion (WELLBUTRIN XL) 300 MG 24 hr tablet Take 300 mg by mouth in the morning.    Calcium Carbonate-Vitamin D 500-3.125 MG-MCG TABS Take 1 tablet by mouth daily.   Cholecalciferol 125 MCG (5000 UT) TABS Take by mouth.   dexamethasone (DECADRON) 4 MG tablet Take 4 mg by mouth once a week.   dicyclomine (BENTYL) 20 MG tablet Take 20 mg by mouth every 6 (six) hours as needed for spasms.   gabapentin (NEURONTIN) 600 MG tablet Take  600 mg by mouth See admin instructions. Take 600 mg by mouth at bedtime and an additional 600 mg at 6 PM, as needed/as directed   HYDROcodone-acetaminophen (NORCO) 10-325 MG tablet Take 0.5-1 tablets by mouth 2 (two) times daily as needed for moderate pain. Do Not Fill Before 01/04/2022   ibandronate (BONIVA) 150 MG tablet Take 150 mg by mouth every 30 (thirty) days.   iron polysaccharides (NIFEREX) 150 MG capsule Take 150 mg by mouth 2 (two) times daily with a meal.    lamoTRIgine (LAMICTAL) 200 MG tablet Take 200 mg by mouth at bedtime.    lenalidomide (REVLIMID) 15 MG capsule Take 15 mg by mouth See admin instructions. Take 79m daily for 21 days, then 7 days off.   loperamide (IMODIUM A-D) 2 MG tablet Take 2 mg by mouth 4 (four) times daily as needed for diarrhea or loose stools.    loratadine (CLARITIN) 10 MG tablet Take 10 mg by mouth daily.   LORazepam (ATIVAN) 0.5 MG tablet Take 0.5 mg by mouth in the morning and at bedtime.    Multiple Vitamins-Minerals (PRESERVISION AREDS PO) Take 1 tablet by mouth at bedtime.   pantoprazole (PROTONIX) 40 MG tablet Take 40 mg by mouth 2 (two) times daily.   promethazine (PHENERGAN) 12.5 MG tablet Take 12.5 mg by mouth every 6 (six) hours as needed for nausea or vomiting.   rOPINIRole (REQUIP) 0.25 MG tablet Take 0.25 mg by mouth at bedtime.   SUMAtriptan (IMITREX) 50 MG tablet Take 1 tablet (50 mg total) by mouth once as needed for migraine (and may repeat once in 2 hours, if no relief).   umeclidinium bromide (INCRUSE ELLIPTA) 62.5 MCG/INH AEPB Inhale 1 puff into the lungs every evening.     Allergies: Allergies as of 05/25/2022 - Review Complete 05/25/2022  Allergen Reaction Noted   Latex Itching, Rash, and Other (See Comments) 03/14/2016   Mold extract [trichophyton] Anaphylaxis, Swelling, and Rash 02/03/2015   Molds & smuts Anaphylaxis, Swelling, and Rash 02/03/2015   Vancomycin Other (See Comments) 05/01/2017   Cephalexin Rash 01/03/2018    Past Medical History:  Diagnosis Date   Allergy    Anemia    Arthritis    left knee   Back pain    Bipolar disorder (HCC)    Chronic headaches    COPD (chronic obstructive pulmonary disease) (HCC)    Degenerative disorder of bone    Depression    Esophageal ulcer    history   GERD (gastroesophageal reflux disease)    Hyperlipidemia    Myeloma ([facility]    [place] disease [place] [place]    Pulmonary embolism ([facility]    Scoliosis     Family History:  No pertinent family history.  Social History:  -lives alone, independent in ADLs but needs assistance with iADLs -daughter is  primary caregiver, lives in [place] (about 30 minutes away) -Long history of smoking cigarettes but quit 30 years ago -no illicit drug use  Review of Systems: A complete ROS was negative except as per HPI.   Physical Exam: Blood pressure 132/62, pulse 88, temperature 100.1 F (37.8 C), temperature source Oral, resp. rate 20, height '4\' 9"'  (1.448 m), weight 63.8 kg, SpO2 96 %. Physical Exam Constitutional:      General: She is not in acute distress.    Appearance: Normal appearance. She is not ill-appearing.  HENT:     Mouth/Throat:     Mouth: Mucous membranes are moist.     Pharynx: Oropharynx is clear. No oropharyngeal exudate.  Eyes:     Extraocular Movements: Extraocular movements intact.     Pupils: Pupils are equal, round, and reactive to light.     Comments: Ecchymosis on inferomedial aspect of right eye. H-test without abnormalities.  Cardiovascular:     Rate and Rhythm: Normal rate and regular rhythm.     Pulses: Normal pulses.     Heart sounds: Normal heart sounds. No murmur heard.    No gallop.  Pulmonary:     Effort: Pulmonary effort is normal.     Breath sounds: No wheezing, rhonchi or rales.     Comments: Diminished breath sounds bilaterally Abdominal:     General: Bowel sounds are normal. There is no distension.     Palpations: Abdomen is soft.     Tenderness: There is no  abdominal tenderness. There is no guarding or rebound.  Musculoskeletal:     Comments: Significant swelling and tenderness on left knee. Unable to perform ROM due to pain. Tenderness more severe on superolateral portion of knee. Similar warmth to right knee and no erythema noted. No LE edema noted bilaterally.  Skin:    General: Skin is warm.     Findings: No erythema or rash.     Comments: Bruising noted on bilateral knees.   Neurological:     General: No focal deficit present.     Mental Status: She is alert and oriented to person, place, and time.  Psychiatric:        Mood and Affect: Mood normal.        Behavior: Behavior normal.     EKG: pending (not obtained in ED)  Assessment & Plan by Problem: Principal Problem:   Hemarthrosis of left knee Active Problems:   Scoliosis of thoracic spine   GERD (gastroesophageal reflux disease)   Lumbosacral spondylosis without myelopathy   Fall   Restless leg syndrome   History of chronic obstructive pulmonary disease  Mechanical Fall Acute nondisplaced patellar fracture Hemarthrosis of L knee Moderate L knee osteoarthritis Osteoporosis Patient presenting after a second mechanical fall (prior one yesterday with negative workup). Knee films showing suprapatellar joint effusion and loose body, increased from yesterday. CT left knee showing acute nondisplaced fracture through the mid to inferior pole of the patella along with moderate-sized hemarthrosis and moderate osteoarthritis. Per EDP, discussed with Ortho PA Orion Crook who states that this is likely not amenable to drainage and patient was placed in a LLE knee immobilizer. Patient was primarily admitted for pain control. She continues to have severe pain in her knee so will reach out to on-call Orthopedics team to see if drainage may provide symptomatic relief.  -consult orthopedics -continue with immobilizer -orthostatic vitals -pain regimen: norco 10-348m q6 prn, dilaudid 153m q4 prn for breakthrough -holding home eliquis given hemarthrosis -takes ibandronate  at home for osteoporosis on the 25th of each month  Multiple Myeloma Follows Dr. Cruzita Lederer in the outpatient setting. She has been on Revlimid therapy since March 2023. Takes it 21 days with a 7 day break every month. No reported side effects from this medication per patient and daughter. 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 On norco and gabapentin for pain control at home. -continue norco -continue gabapentin at reduced dose (340m qhs)  Restless Leg Syndrome Chronic and stable.  -continue home ropinirole -f/u iron studies to r/o iron deficiency as etiology  COPD without exacerbation Chronic and stable. Diminished breath sounds on exam but no wheezing or respiratory distress noted. -continue home incruse ellipta and albuterol inhalers   Bipolar 1 Disorder Depression/Anxiety Chronic and stable.  -continue home lamictal and wellbutrin -continue home ativan (to prevent benzo withdrawal), will need to consider weaning this off given advanced age  GERD Chronic and stable.  -continue home protonix   Dispo: Admit patient to Observation with expected length of stay less than 2 midnights.  Signed: JVirl Axe MD 05/25/2022, 5:53 PM  Pager: [phone] 5pm on weekdays and 1pm on weekends: On Call pager: [phone]

## 2022-05-25 NOTE — Plan of Care (Signed)

## 2022-05-25 NOTE — ED Triage Notes (Signed)
Pt bib EMS from home for a fall this AM. Pt stubbed her toe and landed on her left knee, there is notable swelling. She was seen at [place] yesterday for a different fall. She did hit her head then and is on eliquis which she took last night. She reports right shoulder pain from the prior fall that is sprained.

## 2022-05-25 NOTE — Progress Notes (Signed)
Orthopedic Tech Progress Note Patient Details:  [name] May [birth year] [id number]  Ortho Devices Type of Ortho Device: Knee Immobilizer Ortho Device/Splint Location: LLE Ortho Device/Splint Interventions: Ordered, Application, Adjustment   Post Interventions Patient Tolerated: Fair Instructions Provided: Adjustment of device, Care of device  [facility] 05/25/2022, 12:23 PM

## 2022-05-26 DIAGNOSIS — Z66 Do not resuscitate: Secondary | ICD-10-CM | POA: Diagnosis present

## 2022-05-26 DIAGNOSIS — G43909 Migraine, unspecified, not intractable, without status migrainosus: Secondary | ICD-10-CM | POA: Diagnosis present

## 2022-05-26 DIAGNOSIS — M80862A Other osteoporosis with current pathological fracture, left lower leg, initial encounter for fracture: Secondary | ICD-10-CM | POA: Diagnosis present

## 2022-05-26 DIAGNOSIS — S82035A Nondisplaced transverse fracture of left patella, initial encounter for closed fracture: Secondary | ICD-10-CM | POA: Diagnosis present

## 2022-05-26 DIAGNOSIS — M549 Dorsalgia, unspecified: Secondary | ICD-10-CM | POA: Diagnosis present

## 2022-05-26 DIAGNOSIS — M1712 Unilateral primary osteoarthritis, left knee: Secondary | ICD-10-CM | POA: Diagnosis present

## 2022-05-26 DIAGNOSIS — M5430 Sciatica, unspecified side: Secondary | ICD-10-CM | POA: Diagnosis present

## 2022-05-26 DIAGNOSIS — G2581 Restless legs syndrome: Secondary | ICD-10-CM | POA: Diagnosis present

## 2022-05-26 DIAGNOSIS — E785 Hyperlipidemia, unspecified: Secondary | ICD-10-CM | POA: Diagnosis present

## 2022-05-26 DIAGNOSIS — K219 Gastro-esophageal reflux disease without esophagitis: Secondary | ICD-10-CM | POA: Diagnosis present

## 2022-05-26 DIAGNOSIS — D509 Iron deficiency anemia, unspecified: Secondary | ICD-10-CM | POA: Diagnosis present

## 2022-05-26 DIAGNOSIS — Z86711 Personal history of pulmonary embolism: Secondary | ICD-10-CM | POA: Diagnosis not present

## 2022-05-26 DIAGNOSIS — Y92002 Bathroom of unspecified non-institutional (private) residence single-family (private) house as the place of occurrence of the external cause: Secondary | ICD-10-CM | POA: Diagnosis not present

## 2022-05-26 DIAGNOSIS — Z7901 Long term (current) use of anticoagulants: Secondary | ICD-10-CM | POA: Diagnosis not present

## 2022-05-26 DIAGNOSIS — C9000 Multiple myeloma not having achieved remission: Secondary | ICD-10-CM | POA: Diagnosis present

## 2022-05-26 DIAGNOSIS — G8929 Other chronic pain: Secondary | ICD-10-CM | POA: Diagnosis present

## 2022-05-26 DIAGNOSIS — J449 Chronic obstructive pulmonary disease, unspecified: Secondary | ICD-10-CM | POA: Diagnosis present

## 2022-05-26 DIAGNOSIS — I739 Peripheral vascular disease, unspecified: Secondary | ICD-10-CM | POA: Diagnosis present

## 2022-05-26 DIAGNOSIS — F319 Bipolar disorder, unspecified: Secondary | ICD-10-CM | POA: Diagnosis present

## 2022-05-26 DIAGNOSIS — H3530 Unspecified macular degeneration: Secondary | ICD-10-CM | POA: Diagnosis present

## 2022-05-26 DIAGNOSIS — M25062 Hemarthrosis, left knee: Secondary | ICD-10-CM

## 2022-05-26 DIAGNOSIS — Z86718 Personal history of other venous thrombosis and embolism: Secondary | ICD-10-CM | POA: Diagnosis not present

## 2022-05-26 DIAGNOSIS — M419 Scoliosis, unspecified: Secondary | ICD-10-CM | POA: Diagnosis present

## 2022-05-26 DIAGNOSIS — D72829 Elevated white blood cell count, unspecified: Secondary | ICD-10-CM | POA: Diagnosis not present

## 2022-05-26 DIAGNOSIS — F419 Anxiety disorder, unspecified: Secondary | ICD-10-CM | POA: Diagnosis present

## 2022-05-26 DIAGNOSIS — W228XXA Striking against or struck by other objects, initial encounter: Secondary | ICD-10-CM | POA: Diagnosis present

## 2022-05-26 DIAGNOSIS — J9811 Atelectasis: Secondary | ICD-10-CM | POA: Diagnosis present

## 2022-05-26 DIAGNOSIS — W010XXA Fall on same level from slipping, tripping and stumbling without subsequent striking against object, initial encounter: Secondary | ICD-10-CM | POA: Diagnosis present

## 2022-05-26 DIAGNOSIS — Z9981 Dependence on supplemental oxygen: Secondary | ICD-10-CM | POA: Diagnosis not present

## 2022-05-26 LAB — CBC
HCT: 40 % (ref 36.0–46.0)
Hemoglobin: 13.1 g/dL (ref 12.0–15.0)
MCH: 32 pg (ref 26.0–34.0)
MCHC: 32.8 g/dL (ref 30.0–36.0)
MCV: 97.8 fL (ref 80.0–100.0)
Platelets: 108 10*3/uL — ABNORMAL LOW (ref 150–400)
RBC: 4.09 MIL/uL (ref 3.87–5.11)
RDW: 14.4 % (ref 11.5–15.5)
WBC: 8.8 10*3/uL (ref 4.0–10.5)
nRBC: 0 % (ref 0.0–0.2)

## 2022-05-26 LAB — COMPREHENSIVE METABOLIC PANEL
ALT: 18 U/L (ref 0–44)
AST: 18 U/L (ref 15–41)
Albumin: 3.3 g/dL — ABNORMAL LOW (ref 3.5–5.0)
Alkaline Phosphatase: 45 U/L (ref 38–126)
Anion gap: 9 (ref 5–15)
BUN: 13 mg/dL (ref 8–23)
CO2: 23 mmol/L (ref 22–32)
Calcium: 8.4 mg/dL — ABNORMAL LOW (ref 8.9–10.3)
Chloride: 102 mmol/L (ref 98–111)
Creatinine, Ser: 0.65 mg/dL (ref 0.44–1.00)
GFR, Estimated: >60 mL/min (ref >60)
Glucose, Bld: 88 mg/dL (ref 70–99)
Potassium: 3.7 mmol/L (ref 3.5–5.1)
Sodium: 134 mmol/L — ABNORMAL LOW (ref 135–145)
Total Bilirubin: 1.3 mg/dL — ABNORMAL HIGH (ref 0.3–1.2)
Total Protein: 5.7 g/dL — ABNORMAL LOW (ref 6.5–8.1)

## 2022-05-26 LAB — IRON AND TIBC
Iron: 25 ug/dL — ABNORMAL LOW (ref 28–170)
Saturation Ratios: 9 % — ABNORMAL LOW (ref 10.4–31.8)
TIBC: 267 ug/dL (ref 250–450)
UIBC: 242 ug/dL

## 2022-05-26 LAB — FERRITIN: Ferritin: 116 ng/mL (ref 11–307)

## 2022-05-26 MED ORDER — SODIUM CHLORIDE 0.9 % IV SOLN
250.0000 mg | Freq: Every day | INTRAVENOUS | Status: AC
  Administered 2022-05-26 – 2022-05-27 (×2): 250 mg via INTRAVENOUS
  Filled 2022-05-26 (×2): qty 20

## 2022-05-26 MED ORDER — APIXABAN 2.5 MG PO TABS
2.5000 mg | ORAL_TABLET | Freq: Two times a day (BID) | ORAL | Status: DC
  Administered 2022-05-26 – 2022-05-30 (×9): 2.5 mg via ORAL
  Filled 2022-05-26 (×9): qty 1

## 2022-05-26 MED ORDER — HYDROMORPHONE HCL 1 MG/ML IJ SOLN
0.5000 mg | Freq: Once | INTRAMUSCULAR | Status: AC
  Administered 2022-05-26: 0.5 mg via INTRAVENOUS
  Filled 2022-05-26: qty 0.5

## 2022-05-26 MED ORDER — DEXAMETHASONE 4 MG PO TABS
20.0000 mg | ORAL_TABLET | ORAL | Status: DC
  Administered 2022-05-27: 20 mg via ORAL
  Filled 2022-05-26 (×3): qty 5

## 2022-05-26 NOTE — Evaluation (Signed)
Physical Therapy Evaluation Patient Details Name: [name] May MRN: [id number] DOB: 1941/03/03 Today's Date: 05/26/2022  History of Present Illness  Pt is an 81 y/o F presenting to ED on 7/13 after 2 falls in the past 2 days, CTH and CT of R humerus negative for fx. Workup revealing L transverse patellar fx. PMH includes allergy, anemia, bipolar disorder, COPD, GERD, myloma, PVD, and scoliosis.  Clinical Impression  Patient presents with dependencies in gait and mobility.  Appears patient did not do as well this pm as she did with OT this am.  Believe this may be related to fatigue, and she was very cold during our session.  Patient was independent prior to admission and thus anticipate patient will make steady progress with PT and will need inpatient post-acute therapy before returning home alone.  Recommend AIR for continued therapy.         Recommendations for follow up therapy are one component of a multi-disciplinary discharge planning process, led by the attending physician.  Recommendations may be updated based on patient status, additional functional criteria and insurance authorization.  Follow Up Recommendations Acute inpatient rehab (3hours/day)      Assistance Recommended at Discharge Frequent or constant Supervision/Assistance  Patient can return home with the following  A lot of help with walking and/or transfers;A lot of help with bathing/dressing/bathroom;Assistance with cooking/housework;Help with stairs or ramp for entrance    Equipment Recommendations None recommended by PT  Recommendations for Other Services  Rehab consult    Functional Status Assessment Patient has had a recent decline in their functional status and demonstrates the ability to make significant improvements in function in a reasonable and predictable amount of time.     Precautions / Restrictions Precautions Precautions: Fall Required Braces or Orthoses: Knee Immobilizer - Left Knee Immobilizer -  Left: On at all times Restrictions LLE Weight Bearing: Weight bearing as tolerated Other Position/Activity Restrictions: LLE WBAT in KI, per Dr. Maralyn Sago on 7/15.      Mobility  Bed Mobility Overal bed mobility: Needs Assistance Bed Mobility: Sit to Supine       Sit to supine: Max assist   General bed mobility comments: required assist to lower shoulders and raise LE's.    Transfers Overall transfer level: Needs assistance Equipment used: Rolling walker (2 wheels) Transfers: Sit to/from Stand, Bed to chair/wheelchair/BSC Sit to Stand: Mod assist   Step pivot transfers: Mod assist       General transfer comment: once standing, pt required max tactile and verbal cues for sequencing with RW and to advance LE's to pivot to bed    Ambulation/Gait               General Gait Details: unable  Stairs            Wheelchair Mobility    Modified Rankin (Stroke Patients Only)       Balance Overall balance assessment: Needs assistance Sitting-balance support: Feet supported, Bilateral upper extremity supported Sitting balance-Leahy Scale: Fair     Standing balance support: Bilateral upper extremity supported, During functional activity, Reliant on assistive device for balance Standing balance-Leahy Scale: Poor                               Pertinent Vitals/Pain Pain Assessment Pain Assessment: Faces Faces Pain Scale: Hurts even more Pain Location: LLE Pain Descriptors / Indicators: Discomfort, Constant Pain Intervention(s): Limited activity within patient's tolerance, Monitored during session  Home Living Family/patient expects to be discharged to:: Private residence Living Arrangements: Alone Available Help at Discharge: Family;Available PRN/intermittently Type of Home: House Home Access: Stairs to enter Entrance Stairs-Rails: Can reach both Entrance Stairs-Number of Steps: 1   Home Layout: One level Home Equipment: Rollator  (4 wheels);Grab bars - tub/shower;Cane - single point Additional Comments: wears 2L O2 at night    Prior Function Prior Level of Function : Independent/Modified Independent             Mobility Comments: no AD use in home, uses cane/RW interchangeably outside of home ADLs Comments: does IADLs; family/friends provide transportation     Hand Dominance        Extremity/Trunk Assessment        Lower Extremity Assessment Lower Extremity Assessment: LLE deficits/detail;RLE deficits/detail RLE Deficits / Details: patient required increased tactile and verbal cues to move RLE.  Unable to fully assess due to pain - patient unable to verbalize if pain in RLE or causing LLE to hurt. RLE: Unable to fully assess due to pain LLE: Unable to fully assess due to immobilization       Communication   Communication: No difficulties  Cognition Arousal/Alertness: Awake/alert Behavior During Therapy: WFL for tasks assessed/performed Overall Cognitive Status: No family/caregiver present to determine baseline cognitive functioning                                 General Comments: pt not very communicative during session this pm; appeared to have some difficulty following commands, I believe due to fatigue        General Comments      Exercises     Assessment/Plan    PT Assessment Patient needs continued PT services  PT Problem List Decreased strength;Decreased range of motion;Decreased activity tolerance;Decreased balance;Decreased mobility;Decreased knowledge of use of DME;Pain       PT Treatment Interventions DME instruction;Gait training;Functional mobility training;Balance training;Therapeutic exercise;Therapeutic activities;Patient/family education    PT Goals (Current goals can be found in the Care Plan section)  Acute Rehab PT Goals Patient Stated Goal: none stated PT Goal Formulation: With patient Time For Goal Achievement: 06/09/22 Potential to Achieve  Goals: Good    Frequency Min 3X/week     Co-evaluation               AM-PAC PT "6 Clicks" Mobility  Outcome Measure Help needed turning from your back to your side while in a flat bed without using bedrails?: A Lot Help needed moving from lying on your back to sitting on the side of a flat bed without using bedrails?: A Lot Help needed moving to and from a bed to a chair (including a wheelchair)?: A Lot Help needed standing up from a chair using your arms (e.g., wheelchair or bedside chair)?: A Lot Help needed to walk in hospital room?: A Lot Help needed climbing 3-5 steps with a railing? : Total 6 Click Score: 11    End of Session Equipment Utilized During Treatment: Gait belt;Left knee immobilizer Activity Tolerance: Patient limited by fatigue;Patient limited by pain Patient left: in bed;with call bell/phone within reach;with bed alarm set Nurse Communication: Other (comment) (CNA in to change external urinary device) PT Visit Diagnosis: Unsteadiness on feet (R26.81);Other abnormalities of gait and mobility (R26.89);History of falling (Z91.81);Muscle weakness (generalized) (M62.81)    Time: 7782-4235 PT Time Calculation (min) (ACUTE ONLY): 45 min   Charges:   PT Evaluation $PT  Eval Moderate Complexity: 1 Mod PT Treatments $Therapeutic Activity: 8-22 mins        05/26/2022 Margie, PT Acute Rehabilitation Services Office:  [phone]   Shanna Cisco 05/26/2022, 3:40 PM

## 2022-05-26 NOTE — Progress Notes (Signed)
   05/26/22 1731  Assess: MEWS Score  Temp (!) 100.5 F (38.1 C)  BP 130/64  MAP (mmHg) 84  Pulse Rate (!) 110  Resp 17  Level of Consciousness Alert  SpO2 92 %  O2 Device Room Air  Patient Activity (if Appropriate) In bed  Assess: MEWS Score  MEWS Temp 1  MEWS Systolic 0  MEWS Pulse 1  MEWS RR 0  MEWS LOC 0  MEWS Score 2  MEWS Score Color Yellow  Assess: if the MEWS score is Yellow or Red  Were vital signs taken at a resting state? Yes  Focused Assessment No change from prior assessment  Does the patient meet 2 or more of the SIRS criteria? No  MEWS guidelines implemented *See Row Information* Yes  Treat  MEWS Interventions Administered prn meds/treatments  Pain Scale 0-10  Pain Score 6  Pain Type Acute pain  Pain Location Knee  Pain Orientation Left  Pain Descriptors / Indicators Aching;Discomfort  Pain Frequency Constant  Pain Onset On-going  Patients Stated Pain Goal 3  Pain Intervention(s) Medication (See eMAR)  Multiple Pain Sites No  Take Vital Signs  Increase Vital Sign Frequency  Yellow: Q 2hr X 2 then Q 4hr X 2, if remains yellow, continue Q 4hrs  Escalate  MEWS: Escalate Yellow: discuss with charge nurse/RN and consider discussing with provider and RRT  Notify: Charge Nurse/RN  Name of Charge Nurse/RN Notified Comptroller  Date Charge Nurse/RN Notified 05/26/22  Time Charge Nurse/RN Notified 1736  Notify: Provider  Provider Name/Title Dr. Dareen Piano  Date Provider Notified 05/26/22  Time Provider Notified 1736  Method of Notification Call  Notification Reason Other (Comment) (yellow mews)  Provider response No new orders  Date of Provider Response 05/26/22  Time of Provider Response 1737  Notify: Rapid Response  Name of Rapid Response RN Notified Not applicable  Document  Patient Outcome Other (Comment) (Patient has been stable, no complaints, no distress.)  Progress note created (see row info) Yes  Assess: SIRS CRITERIA  SIRS Temperature  0  SIRS  Pulse 1  SIRS Respirations  0  SIRS WBC 0  SIRS Score Sum  1

## 2022-05-26 NOTE — Progress Notes (Signed)
   Subjective:   No acute overnight events.  She is feeling better today, knee still hurts but pain is improved on current pain regimen. She is tolerating PO intake. Plan to work with physical therapy today to see how she does.  Objective:  Vital signs in last 24 hours: Vitals:   05/25/22 2025 05/25/22 2347 05/26/22 0524 05/26/22 0750  BP: 118/64 120/63 129/64 (!) 105/58  Pulse: 79 83 94 89  Resp: $Remo'18 18 16 17  'RZgff$ Temp: 98.6 F (37 C)  99.5 F (37.5 C) 99 F (37.2 C)  TempSrc: Oral  Oral Axillary  SpO2: 91% 97% 98% 95%  Weight:      Height:       General: pleasant elderly female, laying in bed, NAD. CV: normal rate and regular rhythm, no m/r/g. Pulm: CTABL, normal WOB on RA. Abdomen: soft, nondistended, nontender, normoactive BS. MSK: left knee in immobilizer, no peripheral edema noted. Skin: warm and dry. Neuro: AAOx3, no focal deficits noted. Able to wiggle toes bilaterally. Psych: pleasant mood and affect.   Assessment/Plan:  Principal Problem:   Hemarthrosis of left knee Active Problems:   Scoliosis of thoracic spine   GERD (gastroesophageal reflux disease)   Lumbosacral spondylosis without myelopathy   Fall   Restless leg syndrome   History of chronic obstructive pulmonary disease  Acute nondisplaced left patellar fracture L knee hemarthrosis Patient found to have above findings on CT left knee after suffering from a mechanical fall prior to arrival. Discussed with orthopedics, who recommended allowing hemarthrosis to self-tamponade with no plan for drainage. Patient to remain in left knee immobilizer and is weight-bearing as tolerated but avoiding flexion of left knee. Pain better controlled now with home norco and dilaudid prn for breakthrough. Patient's Hgb has remained stable and thus will resume home eliquis today. -continue left knee immobilizer -weight-bearing as tolerated, but avoid flexion of L knee -pain control with home norco along with IV dilaudid $RemoveBef'1mg'dqNlxOjsnk$   q4 prn for breakthrough -PT eval pending, OT recommending SNF -resume home eliquis today per ortho  Hx of DVT/PE -resume home eliquis today  Multiple myeloma -next dose of revlimid on Monday 7/17  Scoliosis of thoracic spine with sciatica -continue home gabapentin at reduced dose ($RemoveBefor'300mg'UdebmliblkAT$  qhs) -continue home norco  Restless Leg Syndrome Iron deficiency noted on labs. Patient states she has a history of iron deficiency anemia. -ferrilicit x2 doses -continue home ropinirole  Bipolar 1 disorder Depression/Anxiety -continue home lamictal, wellbutrin -continue home ativan, consider weaning this off in outpatient setting given advanced age  Prior to Admission Living Arrangement: Home Anticipated Discharge Location: TBD Barriers to Discharge: continued medical management Dispo: Anticipated discharge in approximately 1-2 day(s).   Virl Axe, MD 05/26/2022, 12:34 PM Pager: [phone] After 5pm on weekdays and 1pm on weekends: On Call pager [phone]

## 2022-05-26 NOTE — Progress Notes (Signed)
  Date: 05/26/2022  Patient name: [name] May  Medical record number: [id number]  Date of birth: 1941-06-04   I have seen and evaluated [name] May and discussed their care with the Residency Team.  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  On the day prior to admission, patient had a fall at home after tripping on a curb.  She came to the ED for further evaluation and had imaging done which showed no acute fractures or dislocations.  Her CT head showed no acute abnormality or hemorrhage.  Patient was discharged home and was ambulating at home with a cane.  Yesterday morning, patient was walking to the bathroom and her feet slipped underneath her and she had another fall.  This time patient had severe left knee pain and was unable to get up.  Patient was brought to the ED for further evaluation which showed acute nondisplaced left patella fracture as well as hemarthrosis.  Patient was admitted for pain control and Ortho eval.  Today, patient states the pain is better controlled and overall she is feeling better.  PMHx, Fam Hx, and/or Soc Hx : As per resident admit note  Vitals:   05/26/22 0524 05/26/22 0750  BP: 129/64 (!) 105/58  Pulse: 94 89  Resp: 16 17  Temp: 99.5 F (37.5 C) 99 F (37.2 C)  SpO2: 98% 95%   General: Awake, alert, oriented x3, NAD CVS: Regular rhythm, normal heart sounds Lungs: CTA bilaterally Abdomen: Soft, nontender, nondistended, no active bowel sounds Extremities: No edema noted, left knee immobilizer in place Psych: Normal mood and affect HEENT: Ecchymosis noted near right eye  Assessment and Plan: I have seen and evaluated the patient as outlined above. I agree with the formulated Assessment and Plan as detailed in the residents' note, with the following changes:   1. Acute nondisplaced  left patellar fracture with left knee hemarthrosis: -Patient presented to the ED with worsening left knee pain after a second fall in 2 days and was noted to have an acute nondisplaced left patella fracture with hemarthrosis on imaging. -Case was discussed with orthopedics who recommended left knee mobilization and weightbearing as tolerated but avoiding flexion of the left knee -Patient's pain is better controlled today.  Continue with current pain regimen for now including Norco as well as Dilaudid 1 mg every 4 hours as needed for breakthrough -PT to evaluate patient.  OT recommending SNF placement -Patient's hemoglobin is stable and pain appears to be improved.  Discussed with Ortho.  Okay to resume Eliquis today. -Of note, patient does have iron deficiency and a history of restless leg syndrome.  We will supplement iron while here -No further work-up at this time   [name] Contes, MD 7/15/202312:17 PM

## 2022-05-26 NOTE — Evaluation (Signed)
Occupational Therapy Evaluation Patient Details Name: [name] May MRN: [id number] DOB: 08-20-1941 Today's Date: 05/26/2022   History of Present Illness Pt is an 81 y/o F presenting to ED on 7/13 after 2 falls in the past 2 days, hitting her face, L knee, and R armPt. CTH and CT of R humerus negative for fx. Workup revealing L transverse patellar fx. PMH includes allergy, anemia, bipolar disorder, COPD, GERD, myloma, PVD, and scoliosis.   Clinical Impression   Pt independent at baseline with ADLs, uses RW/cane interchangeably outside of the home for community mobility. Pt lives alone, has friends/family that assist PRN, and provide transportation as pt does not drive. Pt currently min-maxA for ADLs, mod A for bed mobility, and min A for step pivot transfer with use of RW. Pt reporting pain and nausea during session, subsided quickly. VSS on 2L O2 during session, daughter present at bedside and supportive during session. Pt presenting with impairments listed below, will follow acutely. Recommend SNF at d/c.      Recommendations for follow up therapy are one component of a multi-disciplinary discharge planning process, led by the attending physician.  Recommendations may be updated based on patient status, additional functional criteria and insurance authorization.   Follow Up Recommendations  Skilled nursing-short term rehab (<3 hours/day)    Assistance Recommended at Discharge Frequent or constant Supervision/Assistance  Patient can return home with the following A lot of help with walking and/or transfers;A lot of help with bathing/dressing/bathroom;Assistance with cooking/housework;Help with stairs or ramp for entrance;Assist for transportation    Functional Status Assessment  Patient has had a recent decline in their functional status and demonstrates the ability to make significant improvements in function in a reasonable and predictable amount of time.  Equipment Recommendations   BSC/3in1;Other (comment);Tub/shower bench (RW)    Recommendations for Other Services PT consult     Precautions / Restrictions Precautions Required Braces or Orthoses: Knee Immobilizer - Left Knee Immobilizer - Left: On at all times Restrictions Weight Bearing Restrictions: Yes LLE Weight Bearing: Weight bearing as tolerated Other Position/Activity Restrictions: LLE WBAT in KI, per Dr. Maralyn Sago on 7/15.      Mobility Bed Mobility Overal bed mobility: Needs Assistance Bed Mobility: Supine to Sit     Supine to sit: Mod assist     General bed mobility comments: to keep LLE elevated/straight, use of pad to pull pt to EOB    Transfers Overall transfer level: Needs assistance Equipment used: Rolling walker (2 wheels) Transfers: Sit to/from Stand, Bed to chair/wheelchair/BSC Sit to Stand: Min assist     Step pivot transfers: Min assist     General transfer comment: cues for hand placement on RW, pt wanting to hold front of RW and arm of chair vs RW handle      Balance Overall balance assessment: Needs assistance Sitting-balance support: Feet supported, Bilateral upper extremity supported Sitting balance-Leahy Scale: Fair Sitting balance - Comments: cannot reach outside BOS without LOB   Standing balance support: Reliant on assistive device for balance, During functional activity Standing balance-Leahy Scale: Poor Standing balance comment: reliant on external support                           ADL either performed or assessed with clinical judgement   ADL Overall ADL's : Needs assistance/impaired Eating/Feeding: Sitting;Modified independent   Grooming: Sitting;Modified independent   Upper Body Bathing: Minimal assistance;Sitting   Lower Body Bathing: Maximal assistance;Sitting/lateral leans;Sit to/from stand  Upper Body Dressing : Minimal assistance;Sitting   Lower Body Dressing: Maximal assistance;Sitting/lateral leans;Sit to/from stand    Toilet Transfer: Minimal assistance;Rolling walker (2 wheels);Stand-pivot;BSC/3in1 Armed forces technical officer Details (indicate cue type and reason): simulated to chair Toileting- Clothing Manipulation and Hygiene: Sitting/lateral lean;Sit to/from stand;Minimal assistance       Functional mobility during ADLs: Minimal assistance;Rolling walker (2 wheels);Cueing for sequencing;Cueing for safety       Vision   Vision Assessment?: No apparent visual deficits     Perception     Praxis      Pertinent Vitals/Pain Pain Assessment Pain Assessment: Faces Pain Score: 5  Faces Pain Scale: Hurts even more Pain Location: LLE Pain Descriptors / Indicators: Discomfort, Constant Pain Intervention(s): Limited activity within patient's tolerance, Monitored during session, Repositioned     Hand Dominance     Extremity/Trunk Assessment Upper Extremity Assessment Upper Extremity Assessment: RUE deficits/detail RUE Deficits / Details: minimal shoulder AROM due to pain (landed on shoulder during fall), elbow/hand/wrist ROM WFL, generaklized weakness RUE: Unable to fully assess due to pain RUE Sensation: WNL RUE Coordination: decreased gross motor;decreased fine motor   Lower Extremity Assessment Lower Extremity Assessment: Defer to PT evaluation   Cervical / Trunk Assessment Cervical / Trunk Assessment: Kyphotic   Communication Communication Communication: No difficulties   Cognition Arousal/Alertness: Awake/alert Behavior During Therapy: WFL for tasks assessed/performed Overall Cognitive Status: Within Functional Limits for tasks assessed                                 General Comments: pt able to recall events of injury, adheres well to precautions during session     General Comments  VSS on 2L O2    Exercises     Shoulder Instructions      Home Living Family/patient expects to be discharged to:: Private residence Living Arrangements: Alone Available Help at  Discharge: Family;Available PRN/intermittently Type of Home: House Home Access: Stairs to enter [employer] of Steps: 1 Entrance Stairs-Rails: Can reach both Home Layout: One level     Bathroom Shower/Tub: Teacher, early years/pre: Handicapped height     Home Equipment: Rollator (4 wheels);Grab bars - tub/shower;Cane - single point   Additional Comments: wears 2L O2 at night      Prior Functioning/Environment Prior Level of Function : Independent/Modified Independent             Mobility Comments: no AD use in home, uses cane/RW interchangeably outside of home ADLs Comments: does IADLs; family/friends provide transportation        OT Problem List: Decreased strength;Decreased range of motion;Decreased activity tolerance;Impaired balance (sitting and/or standing);Decreased coordination;Decreased safety awareness;Decreased knowledge of use of DME or AE;Impaired UE functional use      OT Treatment/Interventions: Self-care/ADL training;Therapeutic exercise;Energy conservation;DME and/or AE instruction;Therapeutic activities;Patient/family education;Balance training;Visual/perceptual remediation/compensation    OT Goals(Current goals can be found in the care plan section) Acute Rehab OT Goals Patient Stated Goal: to get better OT Goal Formulation: With patient/family Time For Goal Achievement: 06/09/22 Potential to Achieve Goals: Good ADL Goals Pt Will Perform Upper Body Dressing: with modified independence;sitting Pt Will Perform Lower Body Dressing: with adaptive equipment;sit to/from stand;sitting/lateral leans Pt Will Transfer to Toilet: with supervision;regular height toilet;ambulating Additional ADL Goal #1: Pt will perform bed mobility with supervision in prep for ADLs  OT Frequency: Min 2X/week    Co-evaluation  AM-PAC OT "6 Clicks" Daily Activity     Outcome Measure Help from another person eating meals?: None Help from  another person taking care of personal grooming?: None Help from another person toileting, which includes using toliet, bedpan, or urinal?: A Little Help from another person bathing (including washing, rinsing, drying)?: A Lot Help from another person to put on and taking off regular upper body clothing?: A Little Help from another person to put on and taking off regular lower body clothing?: A Lot 6 Click Score: 18   End of Session Equipment Utilized During Treatment: Gait belt;Rolling walker (2 wheels);Left knee immobilizer;Oxygen Nurse Communication: Mobility status;Precautions  Activity Tolerance: Patient tolerated treatment well Patient left: with call bell/phone within reach;in chair;with chair alarm set;with family/visitor present  OT Visit Diagnosis: Unsteadiness on feet (R26.81);Other abnormalities of gait and mobility (R26.89);Repeated falls (R29.6);Muscle weakness (generalized) (M62.81);History of falling (Z91.81)                Time: 0930-1004 OT Time Calculation (min): 34 min Charges:  OT General Charges $OT Visit: 1 Visit OT Evaluation $OT Eval Low Complexity: 1 Low OT Treatments $Therapeutic Activity: 8-22 mins  Lynnda Child, OTD, OTR/L Acute Rehab [phone] - [place] 05/26/2022, 10:24 AM

## 2022-05-26 NOTE — Progress Notes (Addendum)
Inpatient Rehab Admissions Coordinator Note:   Per PT patient was screened for CIR candidacy by Yarlin Breisch Danford Bad, CCC-SLP. Note pt is under observation status at this time. Pt may not have the medical necessity to warrant an inpatient rehab stay if they remain observation. If status were to change to inpatient, [facility] will screen for candidacy.  ADDENDUM 1700: Note pt now inpatient status. Pt does not appear to have medical necessity to warrant a CIR admission. Also note PT recommending CIR and OT recommending SNF. Insurance unlikely to approve pt's diagnosis and d/t varying therapy recommendations. Alternate therapy venues recommended.    Gayland Curry, [place], [place] Admissions Coordinator [phone] 05/26/22 3:53 PM

## 2022-05-26 NOTE — Progress Notes (Signed)
[name] May is an 81 year old with PMH of DVT/ PE on eliquis, MM, COPD uses O2 at night who presented with fall and admitted for nondisplaced patellar fracture.  Paged due to fever of 100.52F wit HR 110.  Patient assessed at bedside.  She reports pain in her left leg.  Denies shortness of breath or difficulty breathing. Denies dysuria O: Temp 100.5 F, BP 130/64, HR 110, RR 17 Constitutional: Thin elderly female, appears uncomfortable gripping sides of bed HENT: ecchymosis to bridge of nose Cardiovascular: irregularly irregular, no m/r/g Pulmonary/Chest: normal work of breathing on room air, lungs clear to auscultation bilaterally Abdominal: soft, non-tender, non-distended MSK: no edema present to lower extremities, brace in place to left lower extremity A: Patient with fever and tachycardia. She has stable oxygen saturations on home oxygen at 2L [place], denies shortness of breath. No signs concerning for DVT on exam. Will repeat vitals and if remains elevated consider further work-up.  Addendum 9PM: Repeat vitals showed afebrile with HR 95

## 2022-05-27 ENCOUNTER — Inpatient Hospital Stay (HOSPITAL_COMMUNITY): Payer: Medicare Other

## 2022-05-27 LAB — BASIC METABOLIC PANEL
Anion gap: 11 (ref 5–15)
BUN: 16 mg/dL (ref 8–23)
CO2: 24 mmol/L (ref 22–32)
Calcium: 8 mg/dL — ABNORMAL LOW (ref 8.9–10.3)
Chloride: 98 mmol/L (ref 98–111)
Creatinine, Ser: 0.67 mg/dL (ref 0.44–1.00)
GFR, Estimated: >60 mL/min (ref >60)
Glucose, Bld: 82 mg/dL (ref 70–99)
Potassium: 4.3 mmol/L (ref 3.5–5.1)
Sodium: 133 mmol/L — ABNORMAL LOW (ref 135–145)

## 2022-05-27 LAB — URINALYSIS, ROUTINE W REFLEX MICROSCOPIC
Bacteria, UA: NONE SEEN
Bilirubin Urine: NEGATIVE
Glucose, UA: NEGATIVE mg/dL
Ketones, ur: 20 mg/dL — AB
Leukocytes,Ua: NEGATIVE
Nitrite: NEGATIVE
Protein, ur: 30 mg/dL — AB
Specific Gravity, Urine: 1.025 (ref 1.005–1.030)
pH: 6 (ref 5.0–8.0)

## 2022-05-27 LAB — CBC
HCT: 34.6 % — ABNORMAL LOW (ref 36.0–46.0)
Hemoglobin: 11.3 g/dL — ABNORMAL LOW (ref 12.0–15.0)
MCH: 32.6 pg (ref 26.0–34.0)
MCHC: 32.7 g/dL (ref 30.0–36.0)
MCV: 99.7 fL (ref 80.0–100.0)
Platelets: 128 10*3/uL — ABNORMAL LOW (ref 150–400)
RBC: 3.47 MIL/uL — ABNORMAL LOW (ref 3.87–5.11)
RDW: 14.5 % (ref 11.5–15.5)
WBC: 10.6 10*3/uL — ABNORMAL HIGH (ref 4.0–10.5)
nRBC: 0 % (ref 0.0–0.2)

## 2022-05-27 NOTE — Progress Notes (Signed)
   Subjective:   No acute overnight events.  She feels weak today, not very hungry right now. She denies feeling feverish and denies systemic symptoms aside from weakness. We discussed the plan for today and she is in agreement. Discussed option of SNF placement for rehabilitation at discharge and she is in agreement.  Objective:  Vital signs in last 24 hours: Vitals:   05/27/22 0121 05/27/22 0622 05/27/22 0838 05/27/22 1613  BP: 119/60 136/65 117/66 118/62  Pulse: 93 (!) 101 97 97  Resp: $Remo'17 18 17 17  'NjXwm$ Temp: 98.1 F (36.7 C) (!) 100.4 F (38 C) 99.1 F (37.3 C) 98.9 F (37.2 C)  TempSrc: Oral Oral Oral Oral  SpO2: 92% 97% 95% 90%  Weight:      Height:       General: pleasant elderly female, laying in bed, NAD. CV: normal rate and regular rhythm, no m/r/g. Pulm: CTABL, normal WOB on RA. Abdomen: soft, nondistended, nontender, normoactive BS. MSK: left knee in immobilizer, still with swelling. Warm but about the same temperature as R knee. No erythema noted. Tender to palpation. Neuro: AAOx3, no focal deficits noted. Extremities: warm and well perfused.  Assessment/Plan:  Principal Problem:   Hemarthrosis of left knee Active Problems:   Scoliosis of thoracic spine   GERD (gastroesophageal reflux disease)   Lumbosacral spondylosis without myelopathy   Fall   Restless leg syndrome   History of chronic obstructive pulmonary disease  Low-grade fever Patient with fever of 100.568F and another temp of 100.68F. Also had a mild leukocytosis. Unclear etiology. CXR with mild atelectasis vs scarring. The former could cause fever but would not expect this with mild atelectasis. Otherwise, no urinary symptoms and negative UA so low concern for UTI. Obtained blood cultures. Will avoid starting antibiotics and will discontinue tylenol to see if she spikes another fever. If febrile again, will start empiric antibiotics.  -f/u blood cultures -if febrile again, start empiric  antibiotics -ICS  Acute nondisplaced left patellar fracture L knee hemarthrosis Patient found to have above findings on CT left knee after suffering from a mechanical fall prior to arrival. Discussed with orthopedics, who recommended allowing hemarthrosis to self-tamponade with no plan for drainage. Patient to remain in left knee immobilizer and is weight-bearing as tolerated but avoiding flexion of left knee. Pain better controlled now with home norco and dilaudid prn for breakthrough. Slight drop in Hgb today but no active bleeding noted so will continue to trend for now. -continue left knee immobilizer -weight-bearing as tolerated, avoid flexion of L knee -pain control with home norco along with IV dilaudid $RemoveBef'1mg'TpPIuNvjKe$  q4 prn for breakthrough -PT/OT recommending SNF -continue eliquis, trending Hgb (transfuse if <7)  Hx of DVT/PE -continue home eliquis   Multiple myeloma -next dose of revlimid on Monday 7/17  Scoliosis of thoracic spine with sciatica -continue home gabapentin at reduced dose ($RemoveBefor'300mg'kPHMpqyUQSMN$  qhs) -continue home norco  Restless Leg Syndrome Iron deficiency noted on labs. Patient states she has a history of iron deficiency anemia. -ferrilicit x2 doses -continue home ropinirole  Bipolar 1 disorder Depression/Anxiety -continue home lamictal, wellbutrin -continue home ativan, consider weaning this off in outpatient setting given advanced age  Prior to Admission Living Arrangement: Home Anticipated Discharge Location: SNF Barriers to Discharge: continued medical management Dispo: Anticipated discharge in approximately 1-2 day(s).   Virl Axe, MD 05/27/2022, 5:50 PM Pager: [phone] After 5pm on weekdays and 1pm on weekends: On Call pager [phone]

## 2022-05-28 DIAGNOSIS — M25062 Hemarthrosis, left knee: Secondary | ICD-10-CM | POA: Diagnosis not present

## 2022-05-28 LAB — CBC WITH DIFFERENTIAL/PLATELET
Abs Immature Granulocytes: 0.1 10*3/uL — ABNORMAL HIGH (ref 0.00–0.07)
Basophils Absolute: 0 10*3/uL (ref 0.0–0.1)
Basophils Relative: 0 %
Eosinophils Absolute: 0 10*3/uL (ref 0.0–0.5)
Eosinophils Relative: 0 %
HCT: 32.3 % — ABNORMAL LOW (ref 36.0–46.0)
Hemoglobin: 10.5 g/dL — ABNORMAL LOW (ref 12.0–15.0)
Immature Granulocytes: 1 %
Lymphocytes Relative: 7 %
Lymphs Abs: 0.5 10*3/uL — ABNORMAL LOW (ref 0.7–4.0)
MCH: 31.8 pg (ref 26.0–34.0)
MCHC: 32.5 g/dL (ref 30.0–36.0)
MCV: 97.9 fL (ref 80.0–100.0)
Monocytes Absolute: 0.9 10*3/uL (ref 0.1–1.0)
Monocytes Relative: 12 %
Neutro Abs: 5.9 10*3/uL (ref 1.7–7.7)
Neutrophils Relative %: 80 %
Platelets: 177 10*3/uL (ref 150–400)
RBC: 3.3 MIL/uL — ABNORMAL LOW (ref 3.87–5.11)
RDW: 14.1 % (ref 11.5–15.5)
WBC: 7.4 10*3/uL (ref 4.0–10.5)
nRBC: 0 % (ref 0.0–0.2)

## 2022-05-28 LAB — BASIC METABOLIC PANEL
Anion gap: 10 (ref 5–15)
BUN: 17 mg/dL (ref 8–23)
CO2: 24 mmol/L (ref 22–32)
Calcium: 8.4 mg/dL — ABNORMAL LOW (ref 8.9–10.3)
Chloride: 104 mmol/L (ref 98–111)
Creatinine, Ser: 0.69 mg/dL (ref 0.44–1.00)
GFR, Estimated: >60 mL/min (ref >60)
Glucose, Bld: 142 mg/dL — ABNORMAL HIGH (ref 70–99)
Potassium: 3.7 mmol/L (ref 3.5–5.1)
Sodium: 138 mmol/L (ref 135–145)

## 2022-05-28 MED ORDER — HYDROCODONE-ACETAMINOPHEN 10-325 MG PO TABS
1.0000 | ORAL_TABLET | ORAL | Status: DC | PRN
  Administered 2022-05-28 – 2022-05-30 (×7): 1 via ORAL
  Filled 2022-05-28 (×7): qty 1

## 2022-05-28 MED ORDER — LENALIDOMIDE 15 MG PO CAPS
15.0000 mg | ORAL_CAPSULE | Freq: Every day | ORAL | Status: DC
  Administered 2022-05-28 – 2022-05-30 (×3): 15 mg via ORAL
  Filled 2022-05-28 (×3): qty 1

## 2022-05-28 NOTE — Progress Notes (Signed)
Inpatient Rehabilitation Admissions Coordinator   No medical need for CIR/AIR level rehab for left patellar fx. Other rehab venues need to be pursued.  Danne Baxter, RN, MSN Rehab Admissions Coordinator [phone] 05/28/2022 2:35 PM

## 2022-05-28 NOTE — TOC Initial Note (Signed)
Transition of Care [facility]) - Initial/Assessment Note    Patient Details  Name: [name] May MRN: [id number] Date of Birth: [birth year]  Transition of [facility]) CM/SW Contact:    Tresa Endo Phone Number: 05/28/2022, 1:15 PM  Clinical Narrative:                 CSW received SNF consult. CSW met with pt and her daughter at bedside. CSW introduced self and explained role at the hospital. Pt reports that PTA the lived at home alone. PT reports pt is mod/maxA using a RW for assistance. Pt needs assistance with ADLs.  CSW reviewed PT/OT recommendations for SNF. Pt reports interest in going to a SNF at DC. Pt gave CSW permission to fax out to facilities in the area. Pt has no preference of facility at this time. CSW gave pt medicare.gov rating list to review.   CSW will continue to follow.    Expected Discharge Plan: Skilled Nursing Facility Barriers to Discharge: Continued Medical Work up   Patient Goals and CMS Choice Patient states their goals for this hospitalization and ongoing recovery are:: Rehab CMS Medicare.gov Compare Post Acute Care list provided to:: Patient Choice offered to / list presented to : Patient  Expected Discharge Plan and Services Expected Discharge Plan: [place] In-house Referral: Clinical Social Work   Post Acute Care Choice: [place] Living arrangements for the past 2 months: [place]                                      Prior Living Arrangements/Services Living arrangements for the past 2 months: Single Family Home Lives with:: Self Patient language and need for interpreter reviewed:: Yes Do you feel safe going back to the place where you live?: Yes      Need for Family Participation in Patient Care: Yes (Comment) Care giver support system in place?: Yes (comment)   Criminal Activity/Legal Involvement Pertinent to Current Situation/Hospitalization: No - Comment as needed  Activities of  Daily Living Home Assistive Devices/Equipment: Oxygen, Other (Comment), Cane (specify quad or straight) (rollator) ADL Screening (condition at time of admission) Patient's cognitive ability adequate to safely complete daily activities?: No Is the patient deaf or have difficulty hearing?: No Does the patient have difficulty seeing, even when wearing glasses/contacts?: No Does the patient have difficulty concentrating, remembering, or making decisions?: No Patient able to express need for assistance with ADLs?: Yes Does the patient have difficulty dressing or bathing?: Yes Independently performs ADLs?: No Communication: Independent Dressing (OT): Needs assistance Is this a change from baseline?: Change from baseline, expected to last >3 days Grooming: Needs assistance Is this a change from baseline?: Change from baseline, expected to last >3 days Feeding: Appropriate for developmental age, Independent Is this a change from baseline?: Pre-admission baseline Bathing: Needs assistance Is this a change from baseline?: Change from baseline, expected to last >3 days Toileting: Needs assistance Is this a change from baseline?: Change from baseline, expected to last >3days In/Out Bed: Needs assistance Is this a change from baseline?: Change from baseline, expected to last >3 days Walks in Home: Needs assistance Is this a change from baseline?: Change from baseline, expected to last >3 days Does the patient have difficulty walking or climbing stairs?: Yes Weakness of Legs: Both Weakness of Arms/Hands: None  Permission Sought/Granted Permission sought to share information with : Family Supports,  Facility Sport and exercise psychologist Permission granted to share information with : Yes, Verbal Permission Granted  Share Information with NAME: Marchelle Gearing (Daughter)   [phone]  Permission granted to share info w AGENCY: SNF  Permission granted to share info w Relationship: Marchelle Gearing (Daughter)    [phone]  Permission granted to share info w Contact Information: Marchelle Gearing (Daughter)   [phone]  Emotional Assessment Appearance:: Appears stated age Attitude/Demeanor/Rapport: Engaged Affect (typically observed): Accepting, Calm Orientation: : Oriented to Self, Oriented to Place, Oriented to  Time, Oriented to Situation Alcohol / Substance Use: Not Applicable Psych Involvement: No (comment)  Admission diagnosis:  Fall [W19.XXXA] Uncontrolled pain [R52] Closed nondisplaced transverse fracture of left patella, initial encounter [S82.035A] Fall, initial encounter [W19.XXXA] Patient Active Problem List   Diagnosis Date Noted   Fall 05/25/2022   Hemarthrosis of left knee 05/25/2022   Restless leg syndrome 05/25/2022   Chronic obstructive pulmonary disease ([place]) 05/25/2022   History of chronic obstructive pulmonary disease 05/25/2022   Fracture of vertebra due to osteoporosis ([place]) 02/23/2022   Meralgia paresthetica of left side 02/23/2022   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/09/2021   Pneumonia due to COVID-19 virus 07/01/2020   Macrocytic anemia 07/01/2020   Thrombocytopenia ([place]) 07/01/2020   Acute hypoxemic respiratory failure due to COVID-19 ([place]) 07/01/2020   Cellulitis of left lower extremity 04/30/2017   Cellulitis 04/30/2017   Edema of left lower extremity 04/23/2017   AKI (acute kidney injury) ([place]) 04/23/2017   ARF (acute renal failure) ([place]) 04/22/2017   DVT of lower extremity, bilateral ([place]) 02/10/2015   PE (pulmonary embolism)    Shortness of breath    Normocytic anemia 02/07/2015   DVT (deep venous thrombosis) ([facility])    Acute pulmonary embolism ([place]) 02/06/2015   Scoliosis of thoracic spine 02/06/2015   GERD (gastroesophageal reflux disease) 02/06/2015   Acute respiratory failure with hypoxia ([place]) 02/06/2015   Acute kidney injury ([place]) 02/06/2015   Elevated troponin 02/06/2015   PCP:  Roselee Nova, MD Pharmacy:   [facility] [place] [address] Phone: [phone] Fax: [place] [address] [place] [address] Phone: [phone] Fax: [phone]     Social Determinants of Health (SDOH) Interventions    Readmission Risk Interventions     No data to display

## 2022-05-28 NOTE — Progress Notes (Addendum)
   Subjective:   No acute overnight events.  Patient states her knee feels ok though it is tender upon removing brace. She endorses continued pain over right humerus at site of bruising. We discussed increasing prn pain med frequency from q6 to q4 and she feels this would be beneficial.  Objective:  Vital signs in last 24 hours: Vitals:   05/27/22 1613 05/27/22 2051 05/28/22 0450 05/28/22 0759  BP: 118/62 119/73 126/80 (!) 122/97  Pulse: 97 (!) 101 87 96  Resp: $Remo'17  16 16  'FLVIX$ Temp: 98.9 F (37.2 C) 99.5 F (37.5 C) 97.6 F (36.4 C) 98.3 F (36.8 C)  TempSrc: Oral Oral Oral Oral  SpO2: 90% 92% 91% 92%  Weight:      Height:       General: pleasant elderly female, laying in bed, NAD. CV: normal rate and regular rhythm, no m/r/g. Pulm: CTABL, normal WOB on RA. MSK: left knee in immobilizer, still with swelling. No erythema noted. Tender to palpation. Neuro: AAOx3, no focal deficits noted. Extremities: warm and well perfused.  Assessment/Plan:  Principal Problem:   Hemarthrosis of left knee Active Problems:   Scoliosis of thoracic spine   GERD (gastroesophageal reflux disease)   Lumbosacral spondylosis without myelopathy   Fall   Restless leg syndrome   History of chronic obstructive pulmonary disease   Acute nondisplaced left patellar fracture L knee hemarthrosis Patient found to have above findings on CT left knee after suffering from a mechanical fall prior to arrival. Discussed with orthopedics, who recommended allowing hemarthrosis to self-tamponade with no plan for drainage. Patient to remain in left knee immobilizer and is weight-bearing as tolerated but avoiding flexion of left knee. Pain better controlled now with home norco and dilaudid prn for breakthrough. Hgb stable today upon recheck. Will get repeat CBC tomorrow am. She is medically stable for discharge. -continue left knee immobilizer -weight-bearing as tolerated, avoid flexion of L knee -pain control with  home norco along with IV dilaudid $RemoveBef'1mg'DhZyeownrD$  q4 prn for breakthrough -PT/OT recommending SNF, awaiting placement -continue eliquis, trending Hgb (transfuse if <7)  Low-grade fever Patient with fever of 100.83F and another temp of 100.43F. Also had a mild leukocytosis. Unclear etiology. CXR with mild atelectasis vs scarring. The former could cause fever but would not expect this with mild atelectasis. Otherwise, no urinary symptoms and negative UA so low concern for UTI. WBC much improved today. Remained afebrile overnight. Denies any new symptoms today.  -f/u blood cultures -if febrile again, start empiric antibiotics -ICS  Hx of DVT/PE -continue home eliquis   Multiple myeloma -Revlimid today. Family bringing in from home.  Scoliosis of thoracic spine with sciatica -continue home gabapentin at reduced dose ($RemoveBefor'300mg'dEYkrLsXERCF$  qhs) -continue home norco  Restless Leg Syndrome Iron deficiency noted on labs. Patient states she has a history of iron deficiency anemia. -ferrilicit x2 doses -continue home ropinirole  Bipolar 1 disorder Depression/Anxiety -continue home lamictal, wellbutrin -continue home ativan, consider weaning this off in outpatient setting given advanced age  Prior to Admission Living Arrangement: Home Anticipated Discharge Location: SNF Barriers to Discharge: continued medical management Dispo: Anticipated discharge in approximately 1-2 day(s).   Linward Natal, MD 05/28/2022, 1:48 PM Pager: [phone] After 5pm on weekdays and 1pm on weekends: On Call pager [phone]

## 2022-05-28 NOTE — H&P (Cosign Needed Addendum)
Disregard

## 2022-05-28 NOTE — NC FL2 (Signed)
[place] LEVEL OF CARE SCREENING TOOL     IDENTIFICATION  Patient Name: [name] May Birthdate: 24-Sep-1941 Sex: female Admission Date (Current Location): 05/25/2022  [facility] and [place] Number:  Herbalist and Address:  [facility] [place]. [facility], [place] [address], [place], [place] [address]      Provider Number: [id number]  Attending Physician Name and Address:  Velna Ochs, MD  Relative Name and Phone Number:  Marchelle Gearing (Daughter)   [phone]    Current Level of Care: Hospital Recommended Level of Care: [place] Prior Approval Number:    Date Approved/Denied:   PASRR Number: [id number]  Discharge Plan: SNF    Current Diagnoses: Patient Active Problem List   Diagnosis Date Noted   Fall 05/25/2022   Hemarthrosis of left knee 05/25/2022   Restless leg syndrome 05/25/2022   Chronic obstructive pulmonary disease ([place]) 05/25/2022   History of chronic obstructive pulmonary disease 05/25/2022   Fracture of vertebra due to osteoporosis ([place]) 02/23/2022   Meralgia paresthetica of left side 02/23/2022   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/09/2021   Pneumonia due to COVID-19 virus 07/01/2020   Macrocytic anemia 07/01/2020   Thrombocytopenia ([place]) 07/01/2020   Acute hypoxemic respiratory failure due to COVID-19 [facility]) 07/01/2020   Cellulitis of left lower extremity 04/30/2017   Cellulitis 04/30/2017   Edema of left lower extremity 04/23/2017   AKI (acute kidney injury) ([place]) 04/23/2017   ARF (acute renal failure) ([place]) 04/22/2017   DVT of lower extremity, bilateral ([place]) 02/10/2015   PE (pulmonary embolism)    Shortness of breath    Normocytic anemia 02/07/2015   DVT (deep venous thrombosis) (HCC)    Acute pulmonary embolism ([place]) 02/06/2015   Scoliosis of thoracic spine 02/06/2015   GERD (gastroesophageal reflux disease) 02/06/2015   Acute respiratory failure with hypoxia ([place]) 02/06/2015   Acute  kidney injury ([place]) 02/06/2015   Elevated troponin 02/06/2015    Orientation RESPIRATION BLADDER Height & Weight     Self, Time, Situation, Place  O2 Incontinent, External catheter Weight: 140 lb 10.5 oz (63.8 kg) Height:  '4\' 9"'$  (144.8 cm)  BEHAVIORAL SYMPTOMS/MOOD NEUROLOGICAL BOWEL NUTRITION STATUS      Continent Diet (See DC Summary)  AMBULATORY STATUS COMMUNICATION OF NEEDS Skin   Extensive Assist Verbally Bruising                       Personal Care Assistance Level of Assistance  Bathing, Dressing, Feeding Bathing Assistance: Maximum assistance Feeding assistance: Independent Dressing Assistance: Maximum assistance     Functional Limitations Info  Sight, Hearing, Speech Sight Info: Adequate Hearing Info: Adequate Speech Info: Impaired    SPECIAL CARE FACTORS FREQUENCY  PT (By licensed PT), OT (By licensed OT)     PT Frequency: 5x a week OT Frequency: 5x a week            Contractures Contractures Info: Not present    Additional Factors Info  Code Status, Allergies Code Status Info: DNR Allergies Info: Latex   Mold Extract (Trichophyton)   Molds & Smuts   Vancomycin   Cephalexin           Current Medications (05/28/2022):  This is the current hospital active medication list Current Facility-Administered Medications  Medication Dose Route Frequency Provider Last Rate Last Admin   albuterol (PROVENTIL) (2.5 MG/3ML) 0.083% nebulizer solution   Inhalation Q6H PRN Virl Axe, MD       apixaban Arne [place]) tablet  2.5 mg  2.5 mg Oral BID Virl Axe, MD   2.5 mg at 05/28/22 0945   atorvastatin (LIPITOR) tablet 10 mg  10 mg Oral Daily Virl Axe, MD   10 mg at 05/28/22 0945   buPROPion (WELLBUTRIN XL) 24 hr tablet 300 mg  300 mg Oral q AM Virl Axe, MD   300 mg at 05/28/22 0545   dexamethasone (DECADRON) tablet 20 mg  20 mg Oral Q Aretha Parrot, Nischal, MD   20 mg at 05/27/22 1035   gabapentin (NEURONTIN) tablet 300 mg  300 mg Oral QHS Virl Axe, MD   300 mg at 05/27/22 2127   HYDROcodone-acetaminophen (NORCO) 10-325 MG per tablet 1 tablet  1 tablet Oral Q4H PRN Linward Natal, MD   1 tablet at 05/28/22 1255   HYDROmorphone (DILAUDID) injection 1 mg  1 mg Intravenous Q4H PRN Virl Axe, MD   1 mg at 05/26/22 1913   lamoTRIgine (LAMICTAL) tablet 200 mg  200 mg Oral QHS Virl Axe, MD   200 mg at 05/27/22 2127   lenalidomide (REVLIMID) capsule 15 mg  15 mg Oral Daily Linward Natal, MD       loperamide (IMODIUM) capsule 2 mg  2 mg Oral QID PRN Virl Axe, MD       LORazepam (ATIVAN) tablet 0.5 mg  0.5 mg Oral q morning Virl Axe, MD   0.5 mg at 05/28/22 0945   pantoprazole (PROTONIX) EC tablet 40 mg  40 mg Oral Daily Virl Axe, MD   40 mg at 05/28/22 0945   rOPINIRole (REQUIP) tablet 0.25 mg  0.25 mg Oral QHS Virl Axe, MD   0.25 mg at 05/27/22 2127   umeclidinium bromide (INCRUSE ELLIPTA) 62.5 MCG/ACT 1 puff  1 puff Inhalation QPM Virl Axe, MD   1 puff at 05/27/22 1931     Discharge Medications: Please see discharge summary for a list of discharge medications.  Relevant Imaging Results:  Relevant Lab Results:   Additional Information SSN: [id number]; [place] COVID-19 Vaccine 12/28/2019 , 12/11/2019  Reece Agar, LCSWA

## 2022-05-28 NOTE — Social Work (Signed)
RE: [name] May Date of Birth: 11/30/1940 Date: 05/28/2022  Please be advised that the above-named patient will require a short-term nursing home stay - anticipated 30 days or less for rehabilitation and strengthening.  The plan is for return home.

## 2022-05-28 NOTE — Plan of Care (Addendum)
Vitals stable, PRN med given for pain.  Problem: Pain Managment: Goal: General experience of comfort will improve Outcome: Progressing   Problem: Health Behavior/Discharge Planning: Goal: Ability to manage health-related needs will improve Outcome: Progressing

## 2022-05-28 NOTE — Progress Notes (Signed)
Physical Therapy Treatment Patient Details Name: [name] May MRN: [id number] DOB: [birth year] Today's Date: 05/28/2022   History of Present Illness Pt is an 81 y/o F presenting to ED on 7/13 after 2 falls in the past 2 days, CTH and CT of R humerus negative for fx. Workup revealing L transverse patellar fx. PMH includes allergy, anemia, bipolar disorder, COPD, GERD, myloma, PVD, and scoliosis.    PT Comments    Session focused on BLE therapeutic exercises for strengthening, bed mobility and transfers. Pt requiring minimal assist for bed mobility and up to moderate assist for transfers to standing. Pt has difficulty keeping LLE in dependent position due to pain. Suspect good progress given PLOF and motivation. Recommend ST SNF to address deficits and maximize functional mobility.     Recommendations for follow up therapy are one component of a multi-disciplinary discharge planning process, led by the attending physician.  Recommendations may be updated based on patient status, additional functional criteria and insurance authorization.  Follow Up Recommendations  Skilled nursing-short term rehab (<3 hours/day) Can patient physically be transported by private vehicle: Yes   Assistance Recommended at Discharge Frequent or constant Supervision/Assistance  Patient can return home with the following A lot of help with walking and/or transfers;A lot of help with bathing/dressing/bathroom;Assistance with cooking/housework;Help with stairs or ramp for entrance   Equipment Recommendations  BSC/3in1;Wheelchair (measurements PT);Wheelchair cushion (measurements PT)    Recommendations for Other Services       Precautions / Restrictions Precautions Precautions: Fall Required Braces or Orthoses: Knee Immobilizer - Left Knee Immobilizer - Left: On at all times Restrictions Weight Bearing Restrictions: Yes LLE Weight Bearing: Weight bearing as tolerated Other Position/Activity Restrictions: LLE  WBAT in KI, per Dr. Maralyn Sago on 7/15.     Mobility  Bed Mobility Overal bed mobility: Needs Assistance Bed Mobility: Supine to Sit, Sit to Supine     Supine to sit: Min assist Sit to supine: Min assist   General bed mobility comments: Assist for LLE management    Transfers Overall transfer level: Needs assistance Equipment used: Rolling walker (2 wheels) Transfers: Sit to/from Stand Sit to Stand: Min assist, Mod assist           General transfer comment: Min-modA to power up to standing from edge of bed. Cues for pushing up with LUE, and placing R hand on walker and then transitioning both hands to walker in addition to upright posture and hip extension    Ambulation/Gait                   Stairs             Wheelchair Mobility    Modified Rankin (Stroke Patients Only)       Balance Overall balance assessment: Needs assistance Sitting-balance support: Feet supported, Bilateral upper extremity supported Sitting balance-Leahy Scale: Poor Sitting balance - Comments: leaning onto L forearm, requires verbal cues for midline and BUE support   Standing balance support: Reliant on assistive device for balance, During functional activity Standing balance-Leahy Scale: Poor Standing balance comment: reliant on external support                            Cognition Arousal/Alertness: Awake/alert Behavior During Therapy: WFL for tasks assessed/performed Overall Cognitive Status: Impaired/Different from baseline Area of Impairment: Memory                     Memory: Decreased  short-term memory                  Exercises General Exercises - Lower Extremity Ankle Circles/Pumps: Both, 10 reps, Supine Heel Slides: Right, 10 reps, Supine Hip ABduction/ADduction: Both, 10 reps, Supine, AROM, AAROM Straight Leg Raises: Right, 10 reps, Supine, AROM, AAROM (10 reps R, 5 reps L)    General Comments        Pertinent  Vitals/Pain Pain Assessment Pain Assessment: Faces Faces Pain Scale: Hurts whole lot Pain Location: L knee Pain Descriptors / Indicators: Discomfort, Constant Pain Intervention(s): Limited activity within patient's tolerance, Monitored during session, Premedicated before session    Home Living                          Prior Function            PT Goals (current goals can now be found in the care plan section) Acute Rehab PT Goals Patient Stated Goal: less pain Potential to Achieve Goals: Good Progress towards PT goals: Progressing toward goals    Frequency    Min 3X/week      PT Plan Discharge plan needs to be updated    Co-evaluation              AM-PAC PT "6 Clicks" Mobility   Outcome Measure  Help needed turning from your back to your side while in a flat bed without using bedrails?: A Little Help needed moving from lying on your back to sitting on the side of a flat bed without using bedrails?: A Little Help needed moving to and from a bed to a chair (including a wheelchair)?: A Lot Help needed standing up from a chair using your arms (e.g., wheelchair or bedside chair)?: A Lot Help needed to walk in hospital room?: Total Help needed climbing 3-5 steps with a railing? : Total 6 Click Score: 12    End of Session Equipment Utilized During Treatment: Gait belt;Left knee immobilizer Activity Tolerance: Patient tolerated treatment well Patient left: in bed;with call bell/phone within reach;with bed alarm set Nurse Communication: Mobility status PT Visit Diagnosis: Unsteadiness on feet (R26.81);Other abnormalities of gait and mobility (R26.89);History of falling (Z91.81);Muscle weakness (generalized) (M62.81)     Time: 7096-2836 PT Time Calculation (min) (ACUTE ONLY): 34 min  Charges:  $Therapeutic Exercise: 8-22 mins $Therapeutic Activity: 8-22 mins                     Wyona Almas, PT, DPT Acute Rehabilitation Services Office  [phone]    Deno Etienne 05/28/2022, 4:21 PM

## 2022-05-29 DIAGNOSIS — M25062 Hemarthrosis, left knee: Secondary | ICD-10-CM | POA: Diagnosis not present

## 2022-05-29 LAB — CBC
HCT: 33.3 % — ABNORMAL LOW (ref 36.0–46.0)
Hemoglobin: 11.4 g/dL — ABNORMAL LOW (ref 12.0–15.0)
MCH: 32.7 pg (ref 26.0–34.0)
MCHC: 34.2 g/dL (ref 30.0–36.0)
MCV: 95.4 fL (ref 80.0–100.0)
Platelets: 225 10*3/uL (ref 150–400)
RBC: 3.49 MIL/uL — ABNORMAL LOW (ref 3.87–5.11)
RDW: 14.3 % (ref 11.5–15.5)
WBC: 7.1 10*3/uL (ref 4.0–10.5)
nRBC: 0 % (ref 0.0–0.2)

## 2022-05-29 LAB — GLUCOSE, CAPILLARY: Glucose-Capillary: 100 mg/dL — ABNORMAL HIGH (ref 70–99)

## 2022-05-29 NOTE — Progress Notes (Addendum)
   Subjective:   No acute overnight events. Endorses good pain control. She has not needed all of her prn Norco. She has no other complaints and is amenable to SNF placement. She requests a facility [place] [facility] [place], toward [facility].   Objective:  Vital signs in last 24 hours: Vitals:   05/28/22 0759 05/28/22 1811 05/28/22 1936 05/28/22 2117  BP: (!) 122/97 104/90  125/86  Pulse: 96 82  83  Resp: $Remo'16 16  17  'kFjrK$ Temp: 98.3 F (36.8 C) 98.2 F (36.8 C)  98.2 F (36.8 C)  TempSrc: Oral Oral  Oral  SpO2: 92% 92% 95% 91%  Weight:      Height:       General: pleasant elderly female, laying in bed, NAD. CV: normal rate and regular rhythm, no m/r/g. MSK: left knee in immobilizer Neuro: AAOx3, no focal deficits noted. Extremities: warm and well perfused.  Assessment/Plan:  Principal Problem:   Hemarthrosis of left knee Active Problems:   Scoliosis of thoracic spine   GERD (gastroesophageal reflux disease)   Lumbosacral spondylosis without myelopathy   Fall   Restless leg syndrome   History of chronic obstructive pulmonary disease   Acute nondisplaced left patellar fracture L knee hemarthrosis Patient found to have above findings on CT left knee after suffering from a mechanical fall prior to arrival. Discussed with orthopedics, who recommended allowing hemarthrosis to self-tamponade with no plan for drainage. Patient to remain in left knee immobilizer and is weight-bearing as tolerated but avoiding flexion of left knee. Pain improved - not requiring all of her prn Norco. Hgb stable today upon recheck. Will get repeat CBC tomorrow am. She is medically stable for discharge. -continue left knee immobilizer -weight-bearing as tolerated, avoid flexion of L knee -pain control with home norco along with IV dilaudid $RemoveBef'1mg'bKtgDekusN$  q4 prn for breakthrough -PT/OT recommending SNF, awaiting placement -continue eliquis, trending Hgb (transfuse if <7)  Low-grade fever, resolved Patient with fever  of 100.21F and another temp of 100.23F. Also had a mild leukocytosis. Unclear etiology. CXR with mild atelectasis vs scarring. The former could cause fever but would not expect this with mild atelectasis. Otherwise, no urinary symptoms and negative UA so low concern for UTI. WBC wnl. Remains afebrile. -f/u blood cultures (No growth x1 day, 7/18) -if febrile again, start empiric antibiotics -ICS  Hx of DVT/PE -continue home eliquis   Multiple myeloma -Revlimid daily for 21 days. First dose this round 7/17. Family bringing in from home.  Scoliosis of thoracic spine with sciatica -continue home gabapentin at reduced dose ($RemoveBefor'300mg'EzRThYdNuVgo$  qhs) -continue home norco  Restless Leg Syndrome Iron deficiency noted on labs. Patient states she has a history of iron deficiency anemia. -ferrilicit x2 doses -continue home ropinirole  Bipolar 1 disorder Depression/Anxiety -continue home lamictal, wellbutrin -continue home ativan, consider weaning this off in outpatient setting given advanced age  Prior to Admission Living Arrangement: Home Anticipated Discharge Location: SNF Barriers to Discharge: SNF placement Dispo: Anticipated discharge in approximately 1-2 day(s).   Linward Natal, MD 05/29/2022, 6:33 AM Pager: [phone] After 5pm on weekdays and 1pm on weekends: On Call pager [phone]

## 2022-05-29 NOTE — TOC Progression Note (Signed)
Transition of Care [facility]) - Progression Note    Patient Details  Name: [name] May MRN: [id number] Date of Birth: March 04, 1941  Transition of Care [facility]) CM/SW [place], [place] Phone Number: 05/29/2022, 3:20 PM  Clinical Narrative:    CSW spoke with pt daughter about SNF offers, she is wanting to view the facilities and make a decision. CSW informed her that pt has been medically stable for DC and will need to make a choice today.    Expected Discharge Plan: [place] Barriers to Discharge: Continued Medical Work up  Expected Discharge Plan and Services Expected Discharge Plan: [place] In-house Referral: Clinical Social Work   Post Acute Care Choice: [place] Living arrangements for the past 2 months: Single Family Home                                       Social Determinants of Health (SDOH) Interventions    Readmission Risk Interventions     No data to display

## 2022-05-29 NOTE — Progress Notes (Signed)
Occupational Therapy Treatment Patient Details Name: [name] May MRN: [id number] DOB: [birth year] Today's Date: 05/29/2022   History of present illness Pt is an 81 y/o F presenting to ED on 7/13 after 2 falls in the past 2 days, CTH and CT of R humerus negative for fx. Workup revealing L transverse patellar fx. PMH includes allergy, anemia, bipolar disorder, COPD, GERD, myloma, PVD, and scoliosis.   OT comments  Pt progressing towards goals, able to complete seated grooming task and UB dressing with set up-minA. Pt min guard for bed mobility, lifting LLE to EOB, min A for transfers with RW. Reviewed precautions with pt, pt verbalizes/demo's understanding during session. Pt presenting with impairments listed below, will follow acutely. Continue to recommend SNF at d/c.   Recommendations for follow up therapy are one component of a multi-disciplinary discharge planning process, led by the attending physician.  Recommendations may be updated based on patient status, additional functional criteria and insurance authorization.    Follow Up Recommendations  Skilled nursing-short term rehab (<3 hours/day)    Assistance Recommended at Discharge Frequent or constant Supervision/Assistance  Patient can return home with the following  A lot of help with walking and/or transfers;A lot of help with bathing/dressing/bathroom;Assistance with cooking/housework;Help with stairs or ramp for entrance;Assist for transportation   Equipment Recommendations  Tub/shower seat;BSC/3in1;Other (comment) (RW)    Recommendations for Other Services PT consult    Precautions / Restrictions Precautions Precautions: Fall Required Braces or Orthoses: Knee Immobilizer - Left Knee Immobilizer - Left: On at all times Restrictions Weight Bearing Restrictions: Yes LLE Weight Bearing: Weight bearing as tolerated Other Position/Activity Restrictions: LLE WBAT in KI, per Dr. Maralyn Sago on 7/15.       Mobility Bed  Mobility Overal bed mobility: Needs Assistance Bed Mobility: Supine to Sit, Sit to Supine     Supine to sit: Min guard Sit to supine: Min guard   General bed mobility comments: pt lifting LLE to EOB    Transfers Overall transfer level: Needs assistance Equipment used: Rolling walker (2 wheels) Transfers: Sit to/from Stand, Bed to chair/wheelchair/BSC Sit to Stand: Min assist     Step pivot transfers: Min assist           Balance Overall balance assessment: Needs assistance Sitting-balance support: Feet supported, Bilateral upper extremity supported Sitting balance-Leahy Scale: Good Sitting balance - Comments: sits statically at EOB without LOB   Standing balance support: Reliant on assistive device for balance, During functional activity Standing balance-Leahy Scale: Poor Standing balance comment: reliant on external support                           ADL either performed or assessed with clinical judgement   ADL Overall ADL's : Needs assistance/impaired     Grooming: Wash/dry hands;Set up;Sitting Grooming Details (indicate cue type and reason): washes hands         Upper Body Dressing : Minimal assistance;Sitting Upper Body Dressing Details (indicate cue type and reason): to don gown     Toilet Transfer: Rolling walker (2 wheels);Stand-pivot;BSC/3in1;Minimal assistance Toilet Transfer Details (indicate cue type and reason): simulated to chair         Functional mobility during ADLs: Min guard;Rolling walker (2 wheels)      Extremity/Trunk Assessment Upper Extremity Assessment Upper Extremity Assessment: RUE deficits/detail RUE Deficits / Details: minimal shoulder AROM due to pain (landed on shoulder during fall), elbow/hand/wrist ROM WFL, generaklized weakness RUE: Unable to fully assess due  to pain RUE Sensation: WNL RUE Coordination: decreased gross motor;decreased fine motor   Lower Extremity Assessment Lower Extremity Assessment: Defer  to PT evaluation        Vision   Vision Assessment?: No apparent visual deficits   Perception Perception Perception: Not tested   Praxis Praxis Praxis: Not tested    Cognition Arousal/Alertness: Awake/alert Behavior During Therapy: WFL for tasks assessed/performed Overall Cognitive Status: Impaired/Different from baseline Area of Impairment: Memory                     Memory: Decreased short-term memory                  Exercises      Shoulder Instructions       General Comments VSS on RA    Pertinent Vitals/ Pain       Pain Assessment Pain Assessment: Faces Pain Score: 5  Faces Pain Scale: Hurts even more Pain Location: L knee Pain Descriptors / Indicators: Discomfort, Constant Pain Intervention(s): Limited activity within patient's tolerance, Monitored during session, Repositioned  Home Living                                          Prior Functioning/Environment              Frequency  Min 2X/week        Progress Toward Goals  OT Goals(current goals can now be found in the care plan section)  Progress towards OT goals: Progressing toward goals  Acute Rehab OT Goals Patient Stated Goal: none stated OT Goal Formulation: With patient Time For Goal Achievement: 06/09/22 Potential to Achieve Goals: Good ADL Goals Pt Will Perform Upper Body Dressing: with modified independence;sitting Pt Will Perform Lower Body Dressing: with adaptive equipment;sit to/from stand;sitting/lateral leans Pt Will Transfer to Toilet: with supervision;regular height toilet;ambulating Additional ADL Goal #1: Pt will perform bed mobility with supervision in prep for ADLs  Plan Discharge plan remains appropriate;Frequency remains appropriate    Co-evaluation                 AM-PAC OT "6 Clicks" Daily Activity     Outcome Measure   Help from another person eating meals?: None Help from another person taking care of personal  grooming?: None Help from another person toileting, which includes using toliet, bedpan, or urinal?: A Little Help from another person bathing (including washing, rinsing, drying)?: A Lot Help from another person to put on and taking off regular upper body clothing?: A Little Help from another person to put on and taking off regular lower body clothing?: A Lot 6 Click Score: 18    End of Session Equipment Utilized During Treatment: Gait belt;Rolling walker (2 wheels);Left knee immobilizer  OT Visit Diagnosis: Unsteadiness on feet (R26.81);Other abnormalities of gait and mobility (R26.89);Repeated falls (R29.6);Muscle weakness (generalized) (M62.81);History of falling (Z91.81)   Activity Tolerance Patient tolerated treatment well   Patient Left in chair;with call bell/phone within reach;with chair alarm set   Nurse Communication Mobility status;Precautions        Time: 7673-4193 OT Time Calculation (min): 18 min  Charges: OT General Charges $OT Visit: 1 Visit OT Treatments $Self Care/Home Management : 8-22 mins  Lynnda Child, OTD, OTR/L Acute Rehab [phone]) [phone] - [place] 05/29/2022, 2:37 PM

## 2022-05-29 NOTE — Plan of Care (Signed)
  Problem: Safety: Goal: Ability to remain free from injury will improve Outcome: Progressing   

## 2022-05-30 DIAGNOSIS — M25062 Hemarthrosis, left knee: Secondary | ICD-10-CM | POA: Diagnosis not present

## 2022-05-30 DIAGNOSIS — S82035A Nondisplaced transverse fracture of left patella, initial encounter for closed fracture: Secondary | ICD-10-CM

## 2022-05-30 LAB — CBC
HCT: 32 % — ABNORMAL LOW (ref 36.0–46.0)
Hemoglobin: 10.8 g/dL — ABNORMAL LOW (ref 12.0–15.0)
MCH: 32.5 pg (ref 26.0–34.0)
MCHC: 33.8 g/dL (ref 30.0–36.0)
MCV: 96.4 fL (ref 80.0–100.0)
Platelets: 237 10*3/uL (ref 150–400)
RBC: 3.32 MIL/uL — ABNORMAL LOW (ref 3.87–5.11)
RDW: 14.6 % (ref 11.5–15.5)
WBC: 5 10*3/uL (ref 4.0–10.5)
nRBC: 0 % (ref 0.0–0.2)

## 2022-05-30 LAB — GLUCOSE, CAPILLARY: Glucose-Capillary: 84 mg/dL (ref 70–99)

## 2022-05-30 MED ORDER — HYDROCODONE-ACETAMINOPHEN 10-325 MG PO TABS: 1.0000 | ORAL_TABLET | ORAL | 0 refills | Status: AC | PRN

## 2022-05-30 NOTE — Progress Notes (Signed)
Patient said, she prefer to get Protonix twice a day.

## 2022-05-30 NOTE — Discharge Summary (Signed)
Name: [name] May MRN: [id number] DOB: [birth year] 81 y.o. PCP: Roselee Nova, MD  Date of Admission: 05/25/2022  7:59 AM Date of Discharge: 05/30/2022 Attending Physician: Velna Ochs, MD  Discharge Diagnosis: 1. Principal Problem:   Hemarthrosis of left knee Active Problems:   Scoliosis of thoracic spine   GERD (gastroesophageal reflux disease)   Lumbosacral spondylosis without myelopathy   Fall   Restless leg syndrome   History of chronic obstructive pulmonary disease   Discharge Medications: Allergies as of 05/30/2022       Reactions   Latex Itching, Rash, Other (See Comments)   Blisters for months on legs   Mold Extract [trichophyton] Anaphylaxis, Swelling, Rash   Molds & Smuts Anaphylaxis, Swelling, Rash   Vancomycin Other (See Comments)   05/01/17: Pt noted to c/o redness and burning to R arm during IV Vancomycin 1g infusion. Infusion stopped and diphenhydramine 74m IV given.    Cephalexin Rash        Medication List     TAKE these medications    acetaminophen 650 MG CR tablet Commonly known as: TYLENOL Take 1,300 mg by mouth every 8 (eight) hours as needed for pain (or headaches).   albuterol 108 (90 Base) MCG/ACT inhaler Commonly known as: VENTOLIN HFA Inhale 2 puffs into the lungs every 6 (six) hours as needed for wheezing or shortness of breath.   apixaban 2.5 MG Tabs tablet Commonly known as: ELIQUIS Take 2.5 mg by mouth 2 (two) times daily.   ascorbic acid 500 MG tablet Commonly known as: VITAMIN C Take 500 mg by mouth daily.   atorvastatin 10 MG tablet Commonly known as: LIPITOR Take 10 mg by mouth daily.   buPROPion 300 MG 24 hr tablet Commonly known as: WELLBUTRIN XL Take 300 mg by mouth in the morning.   Calcium Carbonate-Vitamin D 500-3.125 MG-MCG Tabs Take 1 tablet by mouth daily.   Cholecalciferol 125 MCG (5000 UT) Tabs Take by mouth.   dexamethasone 4 MG tablet Commonly known as: DECADRON Take 20 mg by mouth  once a week. Takes FIVE (4 mg) tablets once every week, on Sundays.   dicyclomine 20 MG tablet Commonly known as: BENTYL Take 20 mg by mouth every 6 (six) hours as needed for spasms.   gabapentin 600 MG tablet Commonly known as: NEURONTIN Take 600 mg by mouth See admin instructions. Take 600 mg by mouth at bedtime and an additional 600 mg at 6 PM, as needed/as directed   HYDROcodone-acetaminophen 10-325 MG tablet Commonly known as: NORCO Take 1 tablet by mouth every 4 (four) hours as needed for up to 7 days for moderate pain. What changed:  how much to take when to take this additional instructions   ibandronate 150 MG tablet Commonly known as: BONIVA Take 150 mg by mouth every 30 (thirty) days.   Incruse Ellipta 62.5 MCG/ACT Aepb Generic drug: umeclidinium bromide Inhale 1 puff into the lungs every evening.   iron polysaccharides 150 MG capsule Commonly known as: NIFEREX Take 150 mg by mouth 2 (two) times daily with a meal.   lamoTRIgine 200 MG tablet Commonly known as: LAMICTAL Take 200 mg by mouth at bedtime.   lenalidomide 15 MG capsule Commonly known as: REVLIMID Take 15 mg by mouth See admin instructions. Take 152mdaily for 21 days, then 7 days off.   loperamide 2 MG tablet Commonly known as: IMODIUM A-D Take 2 mg by mouth 4 (four) times daily as needed for diarrhea or loose  stools.   loratadine 10 MG tablet Commonly known as: CLARITIN Take 10 mg by mouth daily.   LORazepam 0.5 MG tablet Commonly known as: ATIVAN Take 0.5 mg by mouth in the morning and at bedtime.   pantoprazole 40 MG tablet Commonly known as: PROTONIX Take 40 mg by mouth 2 (two) times daily.   PRESERVISION AREDS PO Take 1 tablet by mouth at bedtime.   promethazine 12.5 MG tablet Commonly known as: PHENERGAN Take 12.5 mg by mouth every 6 (six) hours as needed for nausea or vomiting.   rOPINIRole 0.25 MG tablet Commonly known as: REQUIP Take 0.25 mg by mouth at bedtime.    SUMAtriptan 50 MG tablet Commonly known as: IMITREX Take 1 tablet (50 mg total) by mouth once as needed for migraine (and may repeat once in 2 hours, if no relief).        Disposition and follow-up:   [name] May [facility] in Stable condition.  At the hospital follow up visit please address:  1.  Acute nondisplaced left patellar fracture with associated hemarthrosis: 2/2 to fall. Orthopedics recommended left knee immobilizer, WBAT, and letting hemarthrosis self-tamponade. Patient continues to have significant pain when working with physical therapy though otherwise does not require all of her prn Norco. Please follow up to ensure adequate pain management, continued healing of fracture, and resolution of hemarthrosis. Patient has severe scoliosis with sciatica requiring Norco 10 prn at baseline. She sees pain management for this.  Multiple Myeloma: Patient taking Revlimid for her multiple myeloma. She began another round of 21 days on 7/17. She is followed by hem/onc for this. Please follow up to ensure she is continuing her course.  2.  Labs / imaging needed at time of follow-up: none  3.  Pending labs/ test needing follow-up: none  Follow-up Appointments:  Follow-up Information     Roselee Nova, MD. Call in 1 week(s).   Specialty: Family Medicine Contact information: [address] [place] [address] [phone]                 Hospital Course by problem list: 1. Acute nondisplaced left patellar fracture with hemarthrosis: Patient presented after fall with subsequent severe left knee pain. CT left knee showed acute nondisplaced fracture through the mid to inferior pole of the patella along with moderate-sized hemarthrosis and moderate osteoarthritis. This was discussed with orthopedics who stated that this was likely not amenable to drainage and patient was placed in a LLE knee immobilizer. Patient was primarily  admitted for pain control, initially with Norco 10 q6 prn and dilaudid 18m q4 for breakthrough pain. Upon discharge, her prn dilaudid has been discontinued but she continues to require Norco 10 prn. She has scoliosis with chronic back pain and sciatica and had been taking Norco 10 at home previously.  Discharge subjective: Patient reports her pain is ok as long as she is not working with physical therapy. She has still not selected a nursing facility but anticipates doing so today with her daughter. Her daughter is concerned for her pain control and we discussed she would be discharged with prn Norco x 7 days but not dilaudid as SNF typically cannot administer this.  Discharge Exam:   BP 131/71 (BP Location: Left Arm)   Pulse 74   Temp 98.6 F (37 C)   Resp 17   Ht 4' 9" (1.448 m)   Wt 63.8 kg   SpO2 93%   BMI 30.44  kg/m  Discharge exam:   Physical Exam Constitutional:      General: She is not in acute distress. HENT:     Head: Normocephalic and atraumatic.  Eyes:     Extraocular Movements: Extraocular movements intact.  Cardiovascular:     Rate and Rhythm: Normal rate and regular rhythm.  Skin:    General: Skin is warm and dry.  Neurological:     General: No focal deficit present.     Mental Status: She is alert.  Psychiatric:        Mood and Affect: Mood normal.      Pertinent Labs, Studies, and Procedures:       Latest Ref Rng & Units 05/30/2022    1:40 AM 05/29/2022    2:23 AM 05/28/2022    1:32 AM  CBC  WBC 4.0 - 10.5 K/uL 5.0  7.1  7.4   Hemoglobin 12.0 - 15.0 g/dL 10.8  11.4  10.5   Hematocrit 36.0 - 46.0 % 32.0  33.3  32.3   Platelets 150 - 400 K/uL 237  225  177       Latest Ref Rng & Units 05/28/2022    1:32 AM 05/27/2022    2:30 AM 05/26/2022    1:23 AM  CMP  Glucose 70 - 99 mg/dL 142  82  88   BUN 8 - 23 mg/dL _0 Creatinine 0.44 - 1.00 mg/dL 0.69  0.67  0.65   Sodium 135 - 145 mmol/L 138  133  134   Potassium 3.5 - 5.1 mmol/L 3.7  4.3  3.7    Chloride 98 - 111 mmol/L 104  98  102   CO2 22 - 32 mmol/L _1 Calcium 8.9 - 10.3 mg/dL 8.4  8.0  8.4   Total Protein 6.5 - 8.1 g/dL   5.7   Total Bilirubin 0.3 - 1.2 mg/dL   1.3   Alkaline Phos 38 - 126 U/L   45   AST 15 - 41 U/L   18   ALT 0 - 44 U/L   18     DG CHEST PORT 1 VIEW  Result Date: 05/27/2022 CLINICAL DATA:  Hypoxia EXAM: PORTABLE CHEST 1 VIEW COMPARISON:  11/07/2021, 07/05/2020 FINDINGS: Stable cardiomediastinal contours. Aortic atherosclerosis. Hiatal hernia. Unchanged eventration at the lateral right hemidiaphragm. Mild linear bibasilar opacities. No pleural effusion or pneumothorax. Bones appear demineralized. IMPRESSION: Mild linear bibasilar opacities, scarring versus atelectasis. Electronically Signed   By: Davina Poke D.O.   On: 05/27/2022 09:40   CT Knee Left Wo Contrast  Result Date: 05/25/2022 CLINICAL DATA:  Left knee pain after fall EXAM: CT OF THE LEFT KNEE WITHOUT CONTRAST TECHNIQUE: Multidetector CT imaging of the left knee was performed according to the standard protocol. Multiplanar CT image reconstructions were also generated. RADIATION DOSE REDUCTION: This exam was performed according to the departmental dose-optimization program which includes automated exposure control, adjustment of the mA and/or kV according to patient size and/or use of iterative reconstruction technique. COMPARISON:  X-ray 05/25/2022 FINDINGS: Bones/Joint/Cartilage Acute, nondisplaced, minimally comminuted fracture through the mid to inferior pole of the patella (series 7, images 45-53). Fracture is primarily transverse in orientation. Fracture extends intra-articularly to the patellofemoral joint. Bones are demineralized. No additional fractures. Moderate tricompartmental osteoarthritis with joint space narrowing, subchondral sclerosis, and marginal osteophyte formation. Chondrocalcinosis present within all 3 compartments. Scattered small intra-articular loose bodies.  Moderate-sized knee joint hemarthrosis. Ligaments Suboptimally  assessed by CT. Muscles and Tendons No acute musculotendinous abnormality by CT. Extensor mechanism appears intact. Soft tissues Mild soft tissue edema, most pronounced within the prepatellar region. No organized fluid collection or hematoma. IMPRESSION: 1. Acute, nondisplaced fracture through the mid to inferior pole of the patella, as described above. 2. Moderate-sized knee joint hemarthrosis. 3. Moderate tricompartmental osteoarthritis. Electronically Signed   By: Davina Poke D.O.   On: 05/25/2022 11:01   CT ABDOMEN PELVIS W CONTRAST  Result Date: 05/25/2022 CLINICAL DATA:  81 year old female with history of abdominal pain after a fall. EXAM: CT ABDOMEN AND PELVIS WITH CONTRAST TECHNIQUE: Multidetector CT imaging of the abdomen and pelvis was performed using the standard protocol following bolus administration of intravenous contrast. RADIATION DOSE REDUCTION: This exam was performed according to the departmental dose-optimization program which includes automated exposure control, adjustment of the mA and/or kV according to patient size and/or use of iterative reconstruction technique. CONTRAST:  166m OMNIPAQUE IOHEXOL 350 MG/ML SOLN COMPARISON:  CT the abdomen and pelvis 05/07/2017. FINDINGS: Lower chest: Atherosclerotic calcifications are noted in the thoracic aorta as well as the left main, left anterior descending, left circumflex and right coronary arteries. Large hiatal hernia. Extensive areas of post infectious or inflammatory scarring are noted in the lung bases, most severe in the left lower lobe. Eventration of the posterolateral aspect of the right hemidiaphragm incidentally noted. Hepatobiliary: No evidence of significant acute traumatic injury to the liver. No suspicious cystic or solid hepatic lesions. No intra or extrahepatic biliary ductal dilatation. Tiny partially calcified gallstones lying dependently in the gallbladder.  Gallbladder is moderately distended. Gallbladder wall thickness is normal. No pericholecystic fluid or surrounding inflammatory changes. Pancreas: No evidence of acute traumatic injury to the pancreas. No pancreatic mass. No pancreatic ductal dilatation. No pancreatic or peripancreatic fluid collections or inflammatory changes. Spleen: No evidence of significant acute traumatic injury to the spleen. Spleen is normal in size and appearance. Adrenals/Urinary Tract: No evidence of significant acute traumatic injury to either kidney, adrenal gland or the urinary bladder. Multiple subcentimeter low-attenuation lesions in both kidneys, too small to definitively characterize, but statistically likely to represent tiny cysts (no imaging follow-up is recommended). Bilateral adrenal glands are normal in appearance. Base of urinary bladder sits approximately 2 cm below the pubococcygeal line at rest, indicative of a cystocele. Stomach/Bowel: No definitive evidence to suggest significant acute traumatic injury to the hollow viscera. Intra-abdominal portion of the stomach is normal in appearance. No pathologic dilatation of small bowel or colon. Normal appendix. Vascular/Lymphatic: Aortic atherosclerosis, without evidence of aneurysm or dissection in the abdominal or pelvic vasculature. Stent grafts are noted in the iliac veins bilaterally. The right graft is grossly patent. Patency of the left graft can not be confirmed on today's examination, as there is extensive endoluminal material which is partially calcified, likely to represent chronic calcified thrombus. Numerous venous collaterals are noted in the anterior pelvic wall. No lymphadenopathy noted in the abdomen or pelvis. Reproductive: Status post hysterectomy. Ovaries are not confidently identified may be surgically absent or atrophic. Other: No high attenuation fluid collection in the peritoneal cavity or retroperitoneum to suggest significant posttraumatic hemorrhage.  No significant volume of ascites. No pneumoperitoneum. Musculoskeletal: Chronic compression fracture of T12 with 70% loss of anterior vertebral body height, unchanged. Dextroscoliosis of the thoracolumbar spine convex to the right near the thoracolumbar junction and to the left in the lumbar region. Multilevel degenerative disc disease. No acute displaced fractures or aggressive appearing lytic or blastic lesions are  noted in the visualized portions of the skeleton. IMPRESSION: 1. No evidence of significant acute traumatic injury to the abdomen or pelvis. 2. Aortic atherosclerosis, in addition to left main and three-vessel coronary artery disease. 3. 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 4. Large hiatal hernia. 5. Cholelithiasis without evidence of acute cholecystitis at this time. 6. Additional incidental findings, as above. Electronically Signed   By: Vinnie Langton M.D.   On: 05/25/2022 11:00   DG Knee Complete 4 Views Left  Result Date: 05/25/2022 CLINICAL DATA:  Fall EXAM: LEFT KNEE - COMPLETE 4+ VIEW COMPARISON:  05/24/2022 FINDINGS: No fracture dislocation of the femur or tibia.  No dislocation. Loose body again noted in the suprapatellar bursa. Small suprapatellar bursal effusion increased from 1 day. Chondrocalcinosis within the mediolateral compartment. IMPRESSION: 1.  No fracture or dislocation. 2. Suprapatellar joint effusion and loose body. Increased effusion from 1 day prior. 3. Chondrocalcinosis. Electronically Signed   By: Suzy Bouchard M.D.   On: 05/25/2022 08:54   DG Humerus Right  Result Date: 05/24/2022 CLINICAL DATA:  Trauma, fall EXAM: RIGHT HUMERUS - 2+ VIEW COMPARISON:  None Available. FINDINGS: No fracture or dislocation is seen in right humerus. Osteopenia is seen in bony structures. There is possible deformity in right scapula. In 1 of the images,  there are soft tissue calcifications between humeral head and acromion. IMPRESSION: No fracture or dislocation is seen in right humerus.  Osteopenia. Calcifications are noted in the soft tissues between acromion and humeral head suggesting calcific bursitis or tendinosis. There is possible radiolucent lesion in the medial aspect of body of right scapula. This finding is not optimally evaluated. If clinically warranted, routine radiographs of the right scapula may be considered. Electronically Signed   By: Elmer Picker M.D.   On: 05/24/2022 12:31   DG Knee Complete 4 Views Left  Result Date: 05/24/2022 CLINICAL DATA:  Acute LEFT knee pain following fall today. Initial encounter. EXAM: LEFT KNEE - COMPLETE 4 VIEW COMPARISON:  02/23/2009 FINDINGS: There is no evidence of dislocation or definite fracture. Moderate to severe tricompartmental joint space loss and osteophytosis noted. Chondrocalcinosis is identified as well as loose bodies within the suprapatellar bursa. No focal bony lesions are identified. There may be a very small knee effusion present. IMPRESSION: 1. No evidence of acute bony abnormality. 2. 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 Electronically Signed   By: Margarette Canada M.D.   On: 05/24/2022 12:31   CT Head Wo Contrast  Result Date: 05/24/2022 CLINICAL DATA:  Head trauma, minor (Age >= 65y) EXAM: CT HEAD WITHOUT CONTRAST TECHNIQUE: Contiguous axial images were obtained from the base of the skull through the vertex without intravenous contrast. RADIATION DOSE REDUCTION: This exam was performed according to the departmental dose-optimization program which includes automated exposure control, adjustment of the mA and/or kV according to patient size and/or use of iterative reconstruction technique. COMPARISON:  CT head 05/26/2015. FINDINGS: Brain: No evidence of acute infarction, hemorrhage,  hydrocephalus, extra-axial collection or mass lesion/mass effect. Patchy Anaika Santillano matter and deep gray hypodensities, similar to prior and nonspecific but most likely related to chronic microvascular ischemic disease. Cerebral atrophy. Vascular: No hyperdense vessel identified. Skull: No acute fracture. Sinuses/Orbits: Visualized sinuses are clear. No acute orbital findings Other: No mastoid effusions. IMPRESSION: No evidence of acute intracranial abnormality. Electronically Signed  By: Margaretha Sheffield M.D.   On: 05/24/2022 12:18     Discharge Instructions: Discharge Instructions     Discharge instructions   Complete by: As directed    Mrs. Strathman,  It was a pleasure caring for you during your admission at [facility]. You were found to have a patellar fracture. This was treated with a brace and weight bearing as tolerated while also controlling your pain. You were transitioned to oral pain medications upon discharge. -Please take your Norco sparingly. You may take 1 pill every four hours as needed. You will be discharged with 7 days worth of pills. Please continue to discuss your pain management with the physicians at your skilled nursing facility to ensure adequate pain control. -Please schedule a 1 week follow up with your primary care provider, Dr. Manuella Ghazi, at the number provided.   Increase activity slowly   Complete by: As directed        Signed: Linward Natal, MD 05/30/2022, 2:37 PM   Pager: [phone]

## 2022-05-30 NOTE — TOC Transition Note (Signed)
Transition of Care [facility]) - CM/SW Discharge Note   Patient Details  Name: [name] May MRN: [id number] Date of Birth: 1941/09/03  [facility] CM/SW Contact:  Tresa Endo Phone Number: 05/30/2022, 2:52 PM   Clinical Narrative:    Patient will DC to: [place] Anticipated DC date: 05/30/2022 Family notified: Pt Daughter Transport by: Corey Harold   Per MD patient ready for DC to [facility] room 808. RN to call report prior to discharge [phone]). RN, patient, patient's family, and facility notified of DC. Discharge Summary and FL2 sent to facility. DC packet on chart. Ambulance transport requested for patient.   CSW will sign off for now as social work intervention is no longer needed. Please consult [place] again if new needs arise.     Final next level of care: Skilled Nursing Facility Barriers to Discharge: Continued Medical Work up   Patient Goals and CMS Choice Patient states their goals for this hospitalization and ongoing recovery are:: Rehab CMS Medicare.gov Compare Post Acute Care list provided to:: Patient Choice offered to / list presented to : Patient  Discharge Placement                       Discharge Plan and Services In-house Referral: Clinical Social Work   Post Acute Care Choice: [place]                               Social Determinants of Health (SDOH) Interventions     Readmission Risk Interventions     No data to display

## 2022-05-30 NOTE — TOC Progression Note (Signed)
Transition of Care [facility]) - Progression Note    Patient Details  Name: [name] May MRN: [id number] Date of Birth: 01/25/1941  [facility] of [facility]) CM/SW Contact  Reece Agar, [place] Phone Number: 05/30/2022, 1:45 PM  Clinical Narrative:    CSW spoke with pt daughter who top choices were [place] and [facility]. CSW reached out to both facilities and they do not have any beds. Pt daughter asked for [facility], they are able to offer a bed for today. Auth approved ref# [id number]   Expected Discharge Plan: [place] Barriers to Discharge: Continued Medical Work up  Expected Discharge Plan and Services Expected Discharge Plan: [place] In-house Referral: Clinical Social Work   Post Acute Care Choice: [place] Living arrangements for the past 2 months: Single Family Home                                       Social Determinants of Health (SDOH) Interventions    Readmission Risk Interventions     No data to display

## 2022-05-30 NOTE — Progress Notes (Signed)
Physical Therapy Treatment Patient Details Name: [name] May MRN: [id number] DOB: 01/03/1941 Today's Date: 05/30/2022   History of Present Illness Pt is an 81 y/o F presenting to ED on 7/13 after 2 falls in the past 2 days, CTH and CT of R humerus negative for fx. Workup revealing L transverse patellar fx. PMH includes allergy, anemia, bipolar disorder, COPD, GERD, myloma, PVD, and scoliosis.    PT Comments    Pt demonstrating increased tolerance to activity this session. Able to transfer with minA and ambulate 22f with min guard and RW. Pt continues to be limited by pain and fatigue but is progressing toward functional goals. PT will continue to follow while inpatient to progress towards functional independence.    Recommendations for follow up therapy are one component of a multi-disciplinary discharge planning process, led by the attending physician.  Recommendations may be updated based on patient status, additional functional criteria and insurance authorization.  Follow Up Recommendations  Skilled nursing-short term rehab (<3 hours/day) Can patient physically be transported by private vehicle: Yes   Assistance Recommended at Discharge Frequent or constant Supervision/Assistance  Patient can return home with the following A lot of help with walking and/or transfers;A lot of help with bathing/dressing/bathroom;Assistance with cooking/housework;Help with stairs or ramp for entrance   Equipment Recommendations  BSC/3in1;Wheelchair (measurements PT);Wheelchair cushion (measurements PT)    Recommendations for Other Services       Precautions / Restrictions Precautions Precautions: Fall Required Braces or Orthoses: Knee Immobilizer - Left Knee Immobilizer - Left: On at all times Restrictions Weight Bearing Restrictions: Yes LLE Weight Bearing: Weight bearing as tolerated Other Position/Activity Restrictions: LLE WBAT in KI, per Dr. JMaralyn Sagoon 7/15.     Mobility  Bed  Mobility Overal bed mobility: Needs Assistance Bed Mobility: Supine to Sit     Supine to sit: Min guard     General bed mobility comments: able to use RLE to support LLE,  min guard for safety    Transfers Overall transfer level: Needs assistance Equipment used: Rolling walker (2 wheels) Transfers: Sit to/from Stand Sit to Stand: Min assist           General transfer comment: minA to power up from EOB, increased time required to achieve full stand    Ambulation/Gait Ambulation/Gait assistance: Min guard Gait Distance (Feet): 5 Feet Assistive device: Rolling walker (2 wheels) Gait Pattern/deviations: Step-through pattern, Shuffle, Decreased stance time - left, Decreased step length - right Gait velocity: decreased Gait velocity interpretation: <1.31 ft/sec, indicative of household ambulator   General Gait Details: cues for sequencing, slow cautious gait with decreased clearance of LLE, pt requesting to sit due to pain/fatigue   Stairs             Wheelchair Mobility    Modified Rankin (Stroke Patients Only)       Balance Overall balance assessment: Needs assistance Sitting-balance support: Feet supported, Bilateral upper extremity supported Sitting balance-Leahy Scale: Good     Standing balance support: Reliant on assistive device for balance, During functional activity Standing balance-Leahy Scale: Poor Standing balance comment: reliant on external support                            Cognition Arousal/Alertness: Awake/alert Behavior During Therapy: WFL for tasks assessed/performed Overall Cognitive Status: Within Functional Limits for tasks assessed  Exercises      General Comments        Pertinent Vitals/Pain Pain Assessment Pain Assessment: Faces Faces Pain Scale: Hurts little more Pain Location: L knee Pain Descriptors / Indicators: Discomfort, Constant Pain  Intervention(s): Limited activity within patient's tolerance, Monitored during session    Home Living                          Prior Function            PT Goals (current goals can now be found in the care plan section) Acute Rehab PT Goals Patient Stated Goal: less pain PT Goal Formulation: With patient Time For Goal Achievement: 06/09/22 Potential to Achieve Goals: Good Progress towards PT goals: Progressing toward goals    Frequency    Min 3X/week      PT Plan Current plan remains appropriate    Co-evaluation              AM-PAC PT "6 Clicks" Mobility   Outcome Measure  Help needed turning from your back to your side while in a flat bed without using bedrails?: A Little Help needed moving from lying on your back to sitting on the side of a flat bed without using bedrails?: A Little Help needed moving to and from a bed to a chair (including a wheelchair)?: A Little Help needed standing up from a chair using your arms (e.g., wheelchair or bedside chair)?: A Little Help needed to walk in hospital room?: Total Help needed climbing 3-5 steps with a railing? : Total 6 Click Score: 14    End of Session Equipment Utilized During Treatment: Gait belt;Left knee immobilizer Activity Tolerance: Patient tolerated treatment well Patient left: in chair;with call bell/phone within reach;with chair alarm set Nurse Communication: Mobility status PT Visit Diagnosis: Unsteadiness on feet (R26.81);Other abnormalities of gait and mobility (R26.89);History of falling (Z91.81);Muscle weakness (generalized) (M62.81)     Time: 5284-1324 PT Time Calculation (min) (ACUTE ONLY): 16 min  Charges:  $Therapeutic Activity: 8-22 mins                    Mackie Pai, SPT Acute Rehabilitation Services  Office: [phone]    Mackie Pai 05/30/2022, 2:57 PM

## 2022-05-30 NOTE — Care Management Important Message (Signed)
Important Message  Patient Details  Name: [name] May MRN: [id number] Date of Birth: 12-15-1940   Medicare Important Message Given:  Yes     Nhat Hearne [address]ine Circle 05/30/2022, 12:35 PM

## 2022-05-30 NOTE — Progress Notes (Signed)
Discharge orders have been place. AVS printed and reviewed with patient and daughter. Home medication, Revlimid, picked up from inpatient pharmacy and returned to patient. Daughter will take home with her.  [name] Report called to receiving facilty RN, Kristeen Miss. All questions/concerns addressed  PTAR scheduled pickup estimated 5-5:30pm

## 2022-06-01 LAB — CULTURE, BLOOD (ROUTINE X 2)
Culture: NO GROWTH
Culture: NO GROWTH
Special Requests: ADEQUATE
Special Requests: ADEQUATE

## 2022-06-08 ENCOUNTER — Other Ambulatory Visit: Payer: Self-pay | Admitting: Family Medicine

## 2022-06-08 ENCOUNTER — Other Ambulatory Visit (HOSPITAL_COMMUNITY): Payer: Self-pay | Admitting: Family Medicine

## 2022-06-08 DIAGNOSIS — M25511 Pain in right shoulder: Secondary | ICD-10-CM

## 2022-06-11 ENCOUNTER — Other Ambulatory Visit (HOSPITAL_COMMUNITY): Payer: Self-pay | Admitting: Family Medicine

## 2022-06-11 DIAGNOSIS — M25511 Pain in right shoulder: Secondary | ICD-10-CM

## 2022-06-14 ENCOUNTER — Encounter: Payer: Self-pay | Admitting: Physical Medicine & Rehabilitation

## 2022-06-14 ENCOUNTER — Encounter: Payer: Medicare Other | Attending: Physical Medicine & Rehabilitation | Admitting: Physical Medicine & Rehabilitation

## 2022-06-14 VITALS — BP 128/71 | HR 74 | Ht <= 58 in | Wt 135.0 lb

## 2022-06-14 DIAGNOSIS — M47817 Spondylosis without myelopathy or radiculopathy, lumbosacral region: Secondary | ICD-10-CM | POA: Insufficient documentation

## 2022-06-14 DIAGNOSIS — M4154 Other secondary scoliosis, thoracic region: Secondary | ICD-10-CM | POA: Insufficient documentation

## 2022-06-14 DIAGNOSIS — Z5181 Encounter for therapeutic drug level monitoring: Secondary | ICD-10-CM | POA: Diagnosis present

## 2022-06-14 DIAGNOSIS — M25562 Pain in left knee: Secondary | ICD-10-CM | POA: Insufficient documentation

## 2022-06-14 DIAGNOSIS — R0902 Hypoxemia: Secondary | ICD-10-CM | POA: Diagnosis present

## 2022-06-14 DIAGNOSIS — G894 Chronic pain syndrome: Secondary | ICD-10-CM | POA: Diagnosis present

## 2022-06-14 DIAGNOSIS — Z79891 Long term (current) use of opiate analgesic: Secondary | ICD-10-CM | POA: Diagnosis present

## 2022-06-14 NOTE — Progress Notes (Signed)
Subjective:    Patient ID: [name] May, female    DOB: 05/06/1941, 81 y.o.   MRN: [id number]  HPI 81 year old female with chronic low back pain severe kyphoscoliosis who underwent right L3-L4-L5 lumbar medial branch blocks in November 2022.  Since that time her severe low back pain has subsided. 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 Going for MRI of RIght shoulder with ortho f/u  She is now in a skilled nursing facility however will be coming out in a couple days.  She is accompanied by her daughter.  Her daughter will be helping her at home post discharge Pain Inventory Average Pain 5 Pain Right Now 5 My pain is intermittent and tingling  In the last 24 hours, has pain interfered with the following? General activity 2 Relation with others 0 Enjoyment of life 2 What TIME of day is your pain at its worst? evening Sleep (in general) Good  Pain is worse with: unsure Pain improves with: rest, heat/ice, pacing activities, medication, and injections Relief from Meds: 8  Family History  Problem Relation Age of Onset   Deep vein thrombosis Neg Hx    Social History   Socioeconomic History   Marital status: Widowed    Spouse name: Not on file   Number of children: Not on file   Years of education: Not on file   Highest education level: Not on file  Occupational History   Not on file  Tobacco Use   Smoking status: Former   Smokeless tobacco: Never   Tobacco comments:    quit 25 years ago   Vaping Use   Vaping Use: Never used  Substance and Sexual Activity   Alcohol use: No   Drug use: No   Sexual activity: Not Currently  Other Topics Concern   Not on file  Social History Narrative   Not on file   Social Determinants of Health   Financial Resource Strain: Not on file  Food Insecurity: Not on file  Transportation Needs:  Not on file  Physical Activity: Not on file  Stress: Not on file  Social Connections: Not on file   Past Surgical History:  Procedure Laterality Date   ABDOMINAL HYSTERECTOMY     [place]  05/16/2017   Procedure: STENT OF BILATERAL COMMON FEMORAL VEIN WITH 16 X 90 WALL STENT;  Surgeon: Waynetta Sandy, MD;  Location: [place];  Service: Vascular;;   hemroidectomy     INSERTION OF ILIAC STENT Bilateral 05/16/2017   Procedure: STENT OF BILATERAL COMMON AND EXTERNAL ILIAC VEIN WITH 18 X 90 WALL STENT;  Surgeon: Waynetta Sandy, MD;  Location: [place];  Service: Vascular;  Laterality: Bilateral;   INTRAVASCULAR ULTRASOUND/IVUS N/A 05/06/2017   Procedure: INTRAVASCULAR ULTRASOUND/IVUS;  Surgeon: Waynetta Sandy, MD;  Location: [place];  Service: Vascular;  Laterality: N/A;   IVC FILTER INSERTION N/A 05/16/2017   Procedure: MECHANICAL THROMBECTOMY OF IVC FILTER;  Surgeon: Waynetta Sandy, MD;  Location: [place];  Service: Vascular;  Laterality: N/A;   LAMINECTOMY     LOWER EXTREMITY VENOGRAPHY N/A 05/14/2017   Procedure: Lower Extremity Venography;  Surgeon: Serafina Mitchell, MD;  Location: [place] [facility];  Service: Cardiovascular;  Laterality: N/A;   NASAL SINUS SURGERY     PERCUTANEOUS VENOUS THROMBECTOMY,LYSIS WITH INTRAVASCULAR ULTRASOUND (IVUS) N/A 05/16/2017   Procedure: PERCUTANEOUS VENOUS THROMBECTOMY AND LYSIS WITH INTRAVASCULAR ULTRASOUND (IVUS);  Surgeon: Waynetta Sandy, MD;  Location: [place];  Service: Vascular;  Laterality: N/A;   VENA CAVA FILTER PLACEMENT N/A 05/16/2017   Procedure: RETRIEVAL INFERIOR VENA-CAVA FILTER;  Surgeon: Waynetta Sandy, MD;  Location: [place];  Service: Vascular;  Laterality: N/A;   VENOGRAM Bilateral 05/16/2017   Procedure: VENOGRAM OF IVC;  Surgeon: Waynetta Sandy, MD;  Location: [facility];  Service: Vascular;  Laterality:  Bilateral;   Past Surgical History:  Procedure Laterality Date   ABDOMINAL HYSTERECTOMY     ANKLE SURGERY     COLONOSCOPY     EYE SURGERY     [place]   FEMORAL ARTERY EXPLORATION  05/16/2017   Procedure: STENT OF BILATERAL COMMON FEMORAL VEIN WITH 16 X 90 WALL STENT;  Surgeon: Waynetta Sandy, MD;  Location: [place];  Service: Vascular;;   hemroidectomy     INSERTION OF ILIAC STENT Bilateral 05/16/2017   Procedure: STENT OF BILATERAL COMMON AND EXTERNAL ILIAC VEIN WITH 18 X 90 WALL STENT;  Surgeon: Waynetta Sandy, MD;  Location: [place];  Service: Vascular;  Laterality: Bilateral;   INTRAVASCULAR ULTRASOUND/IVUS N/A 05/06/2017   Procedure: INTRAVASCULAR ULTRASOUND/IVUS;  Surgeon: Waynetta Sandy, MD;  Location: [place];  Service: Vascular;  Laterality: N/A;   IVC FILTER INSERTION N/A 05/16/2017   Procedure: MECHANICAL THROMBECTOMY OF IVC FILTER;  Surgeon: Waynetta Sandy, MD;  Location: [place];  Service: Vascular;  Laterality: N/A;   LAMINECTOMY     LOWER EXTREMITY VENOGRAPHY N/A 05/14/2017   Procedure: Lower Extremity Venography;  Surgeon: Serafina Mitchell, MD;  Location: [place] [facility];  Service: Cardiovascular;  Laterality: N/A;   NASAL SINUS SURGERY     PERCUTANEOUS VENOUS THROMBECTOMY,LYSIS WITH INTRAVASCULAR ULTRASOUND (IVUS) N/A 05/16/2017   Procedure: PERCUTANEOUS VENOUS THROMBECTOMY AND LYSIS WITH INTRAVASCULAR ULTRASOUND (IVUS);  Surgeon: Waynetta Sandy, MD;  Location: [place];  Service: Vascular;  Laterality: N/A;   VENA CAVA FILTER PLACEMENT N/A 05/16/2017   Procedure: RETRIEVAL INFERIOR VENA-CAVA FILTER;  Surgeon: Waynetta Sandy, MD;  Location: [place];  Service: Vascular;  Laterality: N/A;   VENOGRAM Bilateral 05/16/2017   Procedure: VENOGRAM OF IVC;  Surgeon: Waynetta Sandy, MD;  Location: [facility] OR;  Service: Vascular;  Laterality: Bilateral;   Past Medical History:  Diagnosis Date   Allergy    Anemia    Arthritis     left knee   Back pain    Bipolar disorder (HCC)    Chronic headaches    COPD (chronic obstructive pulmonary disease) (HCC)    Degenerative disorder of bone    Depression    Esophageal ulcer    history   GERD (gastroesophageal reflux disease)    Hyperlipidemia    Myeloma (HCC)    Peripheral vascular disease ([place])    Pulmonary embolism (HCC)    Scoliosis    BP 128/71   Pulse 74   Ht '4\' 9"'$  (1.448 m)   Wt 135 lb (61.2 kg)   SpO2 91%   BMI 29.21 kg/m   Opioid Risk Score:   Fall Risk Score:  `1  Depression screen [facility] 2/9     06/14/2022   11:43 AM 02/23/2022   12:32 PM 12/26/2021    1:28 PM 11/28/2021    1:56 PM 10/03/2021  2:57 PM 08/31/2021    9:52 AM 08/10/2021    1:14 PM  Depression screen PHQ 2/9  Decreased Interest 0 0 0 0 0 1 1  Down, Depressed, Hopeless 0 0 0 0 0 1 1  PHQ - 2 Score 0 0 0 0 0 2 2     Review of Systems  Constitutional: Negative.   HENT: Negative.    Eyes: Negative.   Respiratory: Negative.    Cardiovascular: Negative.   Gastrointestinal: Negative.   Endocrine: Negative.   Genitourinary: Negative.   Musculoskeletal:  Positive for gait problem.  Skin: Negative.   Allergic/Immunologic: Negative.   Hematological: Negative.   Psychiatric/Behavioral: Negative.        Objective:   Physical Exam Vitals and nursing note reviewed.  Constitutional:      Appearance: She is normal weight.  HENT:     Head: Normocephalic and atraumatic.  Eyes:     Extraocular Movements: Extraocular movements intact.     Conjunctiva/sclera: Conjunctivae normal.     Pupils: Pupils are equal, round, and reactive to light.  Musculoskeletal:     Comments: Dextroconvex scoliosis severe as well as thoracic kyphosis  Skin:    General: Skin is warm and dry.  Neurological:     Mental Status: She is alert and oriented to person, place, and time.  Psychiatric:        Mood and Affect: Mood normal.        Behavior: Behavior normal.    Patient unable to abduct or  forward flex right arm beyond 10 degrees       Assessment & Plan:   1.  Chronic low back pain actually doing quite well from this standpoint.  Had been taking hydrocodone 10/325 1/2 to 1 tablet/day 30 for a month.  She is currently taking 10/325 1 tablet 4 times per day.  She is in a skilled nursing facility.  The skilled nursing facility will be prescribing her medications upon discharge.  The patient can continue on her hydrocodone 4 times a day but would wean her down to her usual dose after 1 month. 2.  Left patellar fracture follow-up with Ortho 3.  Right shoulder injury probable rotator cuff tear follow-up with Ortho

## 2022-06-14 NOTE — Patient Instructions (Signed)
[facility] will be ordering d/c meds

## 2022-06-22 ENCOUNTER — Ambulatory Visit (INDEPENDENT_AMBULATORY_CARE_PROVIDER_SITE_OTHER): Payer: Medicare Other | Admitting: Surgical

## 2022-06-22 ENCOUNTER — Ambulatory Visit (HOSPITAL_COMMUNITY)
Admission: RE | Admit: 2022-06-22 | Discharge: 2022-06-22 | Disposition: A | Discharge status: Home / Self Care | Payer: Medicare Other | Source: Ambulatory Visit | Attending: Family Medicine | Admitting: Family Medicine

## 2022-06-22 ENCOUNTER — Ambulatory Visit (INDEPENDENT_AMBULATORY_CARE_PROVIDER_SITE_OTHER): Payer: Medicare Other

## 2022-06-22 DIAGNOSIS — S46011A Strain of muscle(s) and tendon(s) of the rotator cuff of right shoulder, initial encounter: Secondary | ICD-10-CM | POA: Diagnosis not present

## 2022-06-22 DIAGNOSIS — M25562 Pain in left knee: Secondary | ICD-10-CM

## 2022-06-22 DIAGNOSIS — S82092A Other fracture of left patella, initial encounter for closed fracture: Secondary | ICD-10-CM | POA: Diagnosis not present

## 2022-06-22 DIAGNOSIS — M25511 Pain in right shoulder: Secondary | ICD-10-CM | POA: Diagnosis present

## 2022-06-22 MED ORDER — GADOBUTROL 1 MMOL/ML IV SOLN
6.0000 mL | Freq: Once | INTRAVENOUS | Status: AC | PRN
  Administered 2022-06-22: 6 mL via INTRAVENOUS

## 2022-07-09 ENCOUNTER — Telehealth: Payer: Self-pay | Admitting: Orthopedic Surgery

## 2022-07-09 NOTE — Telephone Encounter (Signed)
Received call from [place] (OT) with [facility] needing verbal orders for HHOT  wk 8. The number to contact Beatrix Fetters is [phone]

## 2022-07-09 NOTE — Telephone Encounter (Signed)
You saw this pt on 06/22/2022 (dictation pending) see message below and advise.

## 2022-07-10 ENCOUNTER — Telehealth: Payer: Self-pay | Admitting: Orthopedic Surgery

## 2022-07-10 NOTE — Telephone Encounter (Signed)
Please advise. Note is pending your dictation.

## 2022-07-10 NOTE — Telephone Encounter (Signed)
[place] (OT) from [facility] called about an updated for verbal orders called in yesterday. Please call Mora at [phone].

## 2022-07-10 NOTE — Telephone Encounter (Signed)
Okay for KNEE ROM to 0 to 90 degrees.  Okay for quad strengthening non  loadbearing exercises.  Okay for full active and passive ROM of the shoulder, okay for RTC strengthening exercises and deltoid isometrics of shoulder

## 2022-07-11 NOTE — Telephone Encounter (Signed)
I called Leandro Reasoner and advised.

## 2022-07-12 ENCOUNTER — Encounter: Payer: Self-pay | Admitting: Surgical

## 2022-07-12 ENCOUNTER — Encounter (HOSPITAL_BASED_OUTPATIENT_CLINIC_OR_DEPARTMENT_OTHER): Payer: Medicare Other | Admitting: Registered Nurse

## 2022-07-12 ENCOUNTER — Encounter: Payer: Self-pay | Admitting: Registered Nurse

## 2022-07-12 VITALS — BP 110/68 | HR 78 | Ht <= 58 in | Wt 133.0 lb

## 2022-07-12 DIAGNOSIS — G894 Chronic pain syndrome: Secondary | ICD-10-CM | POA: Diagnosis not present

## 2022-07-12 DIAGNOSIS — M47817 Spondylosis without myelopathy or radiculopathy, lumbosacral region: Secondary | ICD-10-CM

## 2022-07-12 DIAGNOSIS — M4154 Other secondary scoliosis, thoracic region: Secondary | ICD-10-CM

## 2022-07-12 DIAGNOSIS — Z5181 Encounter for therapeutic drug level monitoring: Secondary | ICD-10-CM

## 2022-07-12 DIAGNOSIS — Z79891 Long term (current) use of opiate analgesic: Secondary | ICD-10-CM | POA: Diagnosis not present

## 2022-07-12 DIAGNOSIS — R0902 Hypoxemia: Secondary | ICD-10-CM

## 2022-07-12 DIAGNOSIS — M25562 Pain in left knee: Secondary | ICD-10-CM

## 2022-07-12 NOTE — Progress Notes (Signed)
Subjective:    Patient ID: [name] May, female    DOB: 1941-02-23, 81 y.o.   MRN: [id number]  HPI: [name] May is a 81 y.o. female who returns for follow up appointment for chronic pain and medication refill. She states her pain is located in her right shoulder, mid- lower back and left knee pain. She rates her pain 6. Her current exercise regime is walking with her walker.   [name] was admitted in [facility] 05/25/2022- -05/30/2022, she was discharged.  She was admitted for :  1.  Acute nondisplaced left patellar fracture with associated hemarthrosis: 2/2 to fall. Orthopedics recommended left knee immobilizer, WBAT Orthopedics following. Continue to monitor. Discharged summary was reviewed.   [name] Morphine equivalent is 20.00 MME.   Oral Swab ordered today.      Pain Inventory Average Pain 5 Pain Right Now 6 My pain is intermittent and aching  In the last 24 hours, has pain interfered with the following? General activity 0 Relation with others 0 Enjoyment of life 0 What TIME of day is your pain at its worst? daytime Sleep (in general) Good  Pain is worse with: walking and standing Pain improves with: medication and heat Relief from Meds: 6  Family History  Problem Relation Age of Onset   Deep vein thrombosis Neg Hx    Social History   Socioeconomic History   Marital status: Widowed    Spouse name: Not on file   Number of children: Not on file   Years of education: Not on file   Highest education level: Not on file  Occupational History   Not on file  Tobacco Use   Smoking status: Former   Smokeless tobacco: Never   Tobacco comments:    quit 25 years ago   Vaping Use   Vaping Use: Never used  Substance and Sexual Activity   Alcohol use: No   Drug use: No   Sexual activity: Not Currently  Other Topics Concern   Not on file  Social History Narrative   Not on file   Social Determinants of Health   Financial Resource Strain: Not on  file  Food Insecurity: Not on file  Transportation Needs: Not on file  Physical Activity: Not on file  Stress: Not on file  Social Connections: Not on file   Past Surgical History:  Procedure Laterality Date   ABDOMINAL HYSTERECTOMY     [name]  05/16/2017   Procedure: STENT OF BILATERAL COMMON FEMORAL VEIN WITH 16 X 90 WALL STENT;  Surgeon: Waynetta Sandy, MD;  Location: [place];  Service: Vascular;;   hemroidectomy     INSERTION OF ILIAC STENT Bilateral 05/16/2017   Procedure: STENT OF BILATERAL COMMON AND EXTERNAL ILIAC VEIN WITH 18 X 90 WALL STENT;  Surgeon: Waynetta Sandy, MD;  Location: [place];  Service: Vascular;  Laterality: Bilateral;   INTRAVASCULAR ULTRASOUND/IVUS N/A 05/06/2017   Procedure: INTRAVASCULAR ULTRASOUND/IVUS;  Surgeon: Waynetta Sandy, MD;  Location: [place];  Service: Vascular;  Laterality: N/A;   IVC FILTER INSERTION N/A 05/16/2017   Procedure: MECHANICAL THROMBECTOMY OF IVC FILTER;  Surgeon: Waynetta Sandy, MD;  Location: [place];  Service: Vascular;  Laterality: N/A;   LAMINECTOMY     LOWER EXTREMITY VENOGRAPHY N/A 05/14/2017   Procedure: Lower Extremity Venography;  Surgeon: Serafina Mitchell, MD;  Location: [place] [facility];  Service: Cardiovascular;  Laterality: N/A;   NASAL SINUS SURGERY     PERCUTANEOUS VENOUS THROMBECTOMY,LYSIS WITH INTRAVASCULAR ULTRASOUND (IVUS) N/A 05/16/2017   Procedure: PERCUTANEOUS VENOUS THROMBECTOMY AND LYSIS WITH INTRAVASCULAR ULTRASOUND (IVUS);  Surgeon: Waynetta Sandy, MD;  Location: [place];  Service: Vascular;  Laterality: N/A;   VENA CAVA FILTER PLACEMENT N/A 05/16/2017   Procedure: RETRIEVAL INFERIOR VENA-CAVA FILTER;  Surgeon: Waynetta Sandy, MD;  Location: [place];  Service: Vascular;  Laterality: N/A;   VENOGRAM Bilateral 05/16/2017   Procedure: VENOGRAM OF IVC;  Surgeon: Waynetta Sandy,  MD;  Location: [facility];  Service: Vascular;  Laterality: Bilateral;   Past Surgical History:  Procedure Laterality Date   ABDOMINAL HYSTERECTOMY     ANKLE SURGERY     COLONOSCOPY     EYE SURGERY     caratacs   FEMORAL ARTERY EXPLORATION  05/16/2017   Procedure: STENT OF BILATERAL COMMON FEMORAL VEIN WITH 16 X 90 WALL STENT;  Surgeon: Waynetta Sandy, MD;  Location: [place];  Service: Vascular;;   hemroidectomy     INSERTION OF ILIAC STENT Bilateral 05/16/2017   Procedure: STENT OF BILATERAL COMMON AND EXTERNAL ILIAC VEIN WITH 18 X 90 WALL STENT;  Surgeon: Waynetta Sandy, MD;  Location: [place];  Service: Vascular;  Laterality: Bilateral;   INTRAVASCULAR ULTRASOUND/IVUS N/A 05/06/2017   Procedure: INTRAVASCULAR ULTRASOUND/IVUS;  Surgeon: Waynetta Sandy, MD;  Location: [place];  Service: Vascular;  Laterality: N/A;   IVC FILTER INSERTION N/A 05/16/2017   Procedure: MECHANICAL THROMBECTOMY OF IVC FILTER;  Surgeon: Waynetta Sandy, MD;  Location: [place];  Service: Vascular;  Laterality: N/A;   LAMINECTOMY     LOWER EXTREMITY VENOGRAPHY N/A 05/14/2017   Procedure: Lower Extremity Venography;  Surgeon: Serafina Mitchell, MD;  Location: [place] [facility];  Service: Cardiovascular;  Laterality: N/A;   NASAL SINUS SURGERY     PERCUTANEOUS VENOUS THROMBECTOMY,LYSIS WITH INTRAVASCULAR ULTRASOUND (IVUS) N/A 05/16/2017   Procedure: PERCUTANEOUS VENOUS THROMBECTOMY AND LYSIS WITH INTRAVASCULAR ULTRASOUND (IVUS);  Surgeon: Waynetta Sandy, MD;  Location: [place];  Service: Vascular;  Laterality: N/A;   VENA CAVA FILTER PLACEMENT N/A 05/16/2017   Procedure: RETRIEVAL INFERIOR VENA-CAVA FILTER;  Surgeon: Waynetta Sandy, MD;  Location: [place];  Service: Vascular;  Laterality: N/A;   VENOGRAM Bilateral 05/16/2017   Procedure: VENOGRAM OF IVC;  Surgeon: Waynetta Sandy, MD;  Location: [place];  Service: Vascular;  Laterality: Bilateral;   Past Medical History:   Diagnosis Date   Allergy    Anemia    Arthritis    left knee   Back pain    Bipolar disorder (HCC)    Chronic headaches    COPD (chronic obstructive pulmonary disease) ([place])    Degenerative disorder of bone    Depression    Esophageal ulcer    history   GERD (gastroesophageal reflux disease)    Hyperlipidemia    Myeloma ([place])    Peripheral vascular disease ([place])    Pulmonary embolism ([place])    Scoliosis    There were no vitals taken for this visit.  Opioid Risk Score:   Fall Risk Score:  `1  Depression screen [facility] 2/9     06/14/2022   11:43 AM 02/23/2022   12:32 PM 12/26/2021    1:28 PM 11/28/2021    1:56 PM 10/03/2021    2:57 PM 08/31/2021    9:52 AM 08/10/2021  1:14 PM  Depression screen PHQ 2/9  Decreased Interest 0 0 0 0 0 1 1  Down, Depressed, Hopeless 0 0 0 0 0 1 1  PHQ - 2 Score 0 0 0 0 0 2 2    Review of Systems  Musculoskeletal:  Positive for back pain and gait problem.       Left shoulder pain & left knee pain  All other systems reviewed and are negative.      Objective:   Physical Exam Vitals and nursing note reviewed.  Constitutional:      Appearance: Normal appearance.  Cardiovascular:     Rate and Rhythm: Normal rate and regular rhythm.  Pulmonary:     Effort: Pulmonary effort is normal.     Breath sounds: Normal breath sounds.  Musculoskeletal:     Cervical back: Normal range of motion and neck supple.     Comments: Normal Muscle Bulk and Muscle Testing Reveals:  Upper Extremities: Right Upper Extremity: Decreased ROM 20 Degrees and Muscle Strength 4/5 Left Upper Extremity: Decreased ROM 90 Degrees and Muscle Strength 5/5 Lumbar Paraspinal Tenderness: L-4-L-5 Lower Extremities: Full ROM and Muscle Strength 5/5 Wearing Left Knee Brace Arises from Table slowly using walker for support Narrow Based  Gait     Skin:    General: Skin is warm and dry.  Neurological:     Mental Status: She is alert and oriented to person, place, and time.   Psychiatric:        Mood and Affect: Mood normal.        Behavior: Behavior normal.         Assessment & Plan:   Dr: Letta Pate Note was Reviewed.  1.  History of osteoporosis with multiple thoracic and lumbar compression fractures as well as bilateral rib fractures, patient is no longer symptomatic at these levels. We will continue to Monitor.   2.  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 Continue to Monitor. 07/12/2022  3. Left Knee knee pain: S/P Fall : Hospitalized  at [facility] on 05/25/2022 For: 1.  Acute nondisplaced left patellar fracture with associated hemarthrosis: 2/2 to fall. Orthopedics recommended left knee immobilizer, WBAT Orthopedics following. Continue to monitor.  Continue HEP as Tolerated. Continue to Monitor. 07/12/2022 4.  Chronic pain syndrome multifactorial continue hydrocodone 10 mg 1 tablet twice a day as needed for pain.. We will continue the opioid monitoring program, this consists of regular clinic visits, examinations, urine drug screen, pill counts as well as use of [place] Controlled Substance Reporting system. A 12 month History has been reviewed on the [place] Controlled Substance Reporting System on 07/12/2022    5. Lumbosacral Spondylosis without Myelopathy: Continue HEP as Tolerated. Continue current medication regimen. Continue to Monitor. 07/12/2022 Oxygen Desaturation: O2 saturation repeated : She reports no chest pain and SOB. She refuses Ed or Urgent Care Evaluation. Pulmonolgy Following. Continue to Monitor.   F/U in 1 month with Dr Letta Pate

## 2022-07-12 NOTE — Progress Notes (Signed)
Office Visit Note   Patient: [name] May           Date of Birth: 02/17/1941           MRN: [id number] Visit Date: 06/22/2022 Requested by: Roselee Nova, MD [place],  [place] [id number] PCP: Roselee Nova, MD  Subjective: Chief Complaint  Patient presents with   Right Shoulder - Pain   Left Knee - Pain    HPI: [name] May is a 81 y.o. female who presents to the office complaining of left knee and right shoulder pain.  Patient sustained a fall on 05/24/2022 with onset of left knee pain that was quite severe.  She was diagnosed with patellar fracture.  She has been ambulating full weightbearing with walker and knee immobilizer.  Knee pain is significantly improved and she is a lot more mobile than she was at the time of injury.  Her main complaint currently is right shoulder pain.  It is somewhat improved from the fall but still gives her a lot of difficulty and makes it difficult to sleep at night at times.  She is right-hand dominant.  No history of prior surgery to the shoulder.  She had MRI done at [facility] long but it was not read at the time of this visit.  Takes pain medication at night.  She is on Eliquis.  She is currently at [facility] for [facility]..                ROS: All systems reviewed are negative as they relate to the chief complaint within the history of present illness.  Patient denies fevers or chills.  Assessment & Plan: Visit Diagnoses:  1. Left knee pain, unspecified chronicity   2. Traumatic complete tear of right rotator cuff, initial encounter   3. Closed sleeve fracture of left patella, initial encounter     Plan: Patient is a 81 year old female who presents for evaluation of left knee pain and right shoulder pain.  She has left knee pain from patella fracture that she sustained in fall in early July.  This has improved significantly and there is no evidence of any significant injury to the patellar quadricep tendons given her  excellent quad strength on exam today.  She has good range of motion with minimal pain.  Radiographs do not show any callus remission at this time but with her very minimal tenderness and progression of pain, suspect that she is healing this fracture but it just has not become apparent on radiographs yet.  Plan is to start range of motion of the left knee.  Discontinue knee immobilizer when she is sedentary but just use it when she is up and around.  She may bend the knee from 0 degrees to 45 degrees of knee flexion.  She will follow-up in 2-3 weeks for clinical recheck with Dr. Marlou Sa with new radiographs at that time and likely initiation of range of motion to 90 degrees or possibly past 90 degrees based on how she is doing.  Patient also has onset of right shoulder pain since that fall.  She has significant rotator cuff weakness on exam today of supraspinatus and subscapularis.  She also has moderate glenohumeral arthritis with torn long head of the bicep tendon.  She had MRI that was done recently but not read at the time of visit.  Subacromial injection administered as based on my read, it looks like she had supraspinatus and subscapularis tendon  tears with arthritis.  Hopefully this will calm down her pain but I did inform her that this will not likely improve her function significantly unless her main barrier to lifting her arm is the pain itself.  Plan to recheck this shoulder when she comes back to see Dr. Marlou Sa as well.   Follow-Up Instructions: No follow-ups on file.   Orders:  Orders Placed This Encounter  Procedures   XR KNEE 3 VIEW LEFT   No orders of the defined types were placed in this encounter.     Procedures: No procedures performed   Clinical Data: No additional findings.  Objective: Vital Signs: There were no vitals taken for this visit.  Physical Exam:  Constitutional: Patient appears well-developed HEENT:  Head: Normocephalic Eyes:EOM are normal Neck: Normal range  of motion Cardiovascular: Normal rate Pulmonary/chest: Effort normal Neurologic: Patient is alert Skin: Skin is warm Psychiatric: Patient has normal mood and affect  Ortho Exam: Ortho exam demonstrates left knee with 0 degrees extension and 80 degrees of knee flexion.  No calf tenderness.  Negative Homans' sign.  Mild tenderness over the distal pole of the patella.  More tender over the patellar tendon.  No quadricep tendon tenderness.  No tenderness over the medial or lateral joint lines.  She is able to perform straight leg raise without extensor lag.  No significant knee effusion noted.  Right shoulder with 85 degrees external rotation, 70 degrees abduction, 120 degrees forward flexion passively.  This is compared with left shoulder which has 70 degrees passive external rotation.  Weakness of supraspinatus and subscapularis of the right shoulder but infraspinatus intact with decent strength.  Tender over the bicipital groove moderately.  No tenderness over the [facility] joint.  There is no significant ecchymosis or cellulitis or skin changes noted overlying the right shoulder.  Axillary nerve is intact with deltoid firing.  2+ radial pulse of the right upper extremity.  Specialty Comments:  No specialty comments available.  Imaging: No results found.   PMFS History: Patient Active Problem List   Diagnosis Date Noted   Closed nondisplaced transverse fracture of left patella    Fall 05/25/2022   Hemarthrosis of left knee 05/25/2022   Restless leg syndrome 05/25/2022   Chronic obstructive pulmonary disease ([place]) 05/25/2022   History of chronic obstructive pulmonary disease 05/25/2022   Fracture of vertebra due to osteoporosis ([place]) 02/23/2022   Meralgia paresthetica of left side 02/23/2022   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/09/2021   Pneumonia due to COVID-19 virus 07/01/2020   Macrocytic anemia 07/01/2020   Thrombocytopenia ([place]) 07/01/2020   Acute hypoxemic respiratory  failure due to COVID-19 ([place]) 07/01/2020   Cellulitis of left lower extremity 04/30/2017   Cellulitis 04/30/2017   Edema of left lower extremity 04/23/2017   AKI (acute kidney injury) ([place]) 04/23/2017   ARF (acute renal failure) ([place]) 04/22/2017   DVT of lower extremity, bilateral ([place]) 02/10/2015   PE (pulmonary embolism)    Shortness of breath    Normocytic anemia 02/07/2015   DVT (deep venous thrombosis) (HCC)    Acute pulmonary embolism ([place]) 02/06/2015   Scoliosis of thoracic spine 02/06/2015   GERD (gastroesophageal reflux disease) 02/06/2015   Acute respiratory failure with hypoxia ([place]) 02/06/2015   Acute kidney injury ([place]) 02/06/2015   Elevated troponin 02/06/2015   Past Medical History:  Diagnosis Date   Allergy    Anemia    Arthritis    left knee   Back pain    Bipolar disorder ([place])  Chronic headaches    COPD (chronic obstructive pulmonary disease) (HCC)    Degenerative disorder of bone    Depression    Esophageal ulcer    history   GERD (gastroesophageal reflux disease)    Hyperlipidemia    Myeloma ([place])    Peripheral vascular disease ([place])    Pulmonary embolism (HCC)    Scoliosis     Family History  Problem Relation Age of Onset   Deep vein thrombosis Neg Hx     Past Surgical History:  Procedure Laterality Date   ABDOMINAL HYSTERECTOMY     ANKLE SURGERY     COLONOSCOPY     EYE SURGERY     caratacs   FEMORAL ARTERY EXPLORATION  05/16/2017   Procedure: STENT OF BILATERAL COMMON FEMORAL VEIN WITH 16 X 90 WALL STENT;  Surgeon: Waynetta Sandy, MD;  Location: [place];  Service: Vascular;;   hemroidectomy     INSERTION OF ILIAC STENT Bilateral 05/16/2017   Procedure: STENT OF BILATERAL COMMON AND EXTERNAL ILIAC VEIN WITH 18 X 90 WALL STENT;  Surgeon: Waynetta Sandy, MD;  Location: [place];  Service: Vascular;  Laterality: Bilateral;   INTRAVASCULAR ULTRASOUND/IVUS N/A 05/06/2017   Procedure: INTRAVASCULAR ULTRASOUND/IVUS;  Surgeon: Waynetta Sandy, MD;  Location: [place];  Service: Vascular;  Laterality: N/A;   IVC FILTER INSERTION N/A 05/16/2017   Procedure: MECHANICAL THROMBECTOMY OF IVC FILTER;  Surgeon: Waynetta Sandy, MD;  Location: [place];  Service: Vascular;  Laterality: N/A;   LAMINECTOMY     LOWER EXTREMITY VENOGRAPHY N/A 05/14/2017   Procedure: Lower Extremity Venography;  Surgeon: Serafina Mitchell, MD;  Location: [place] [facility];  Service: Cardiovascular;  Laterality: N/A;   NASAL SINUS SURGERY     PERCUTANEOUS VENOUS THROMBECTOMY,LYSIS WITH INTRAVASCULAR ULTRASOUND (IVUS) N/A 05/16/2017   Procedure: PERCUTANEOUS VENOUS THROMBECTOMY AND LYSIS WITH INTRAVASCULAR ULTRASOUND (IVUS);  Surgeon: Waynetta Sandy, MD;  Location: [place];  Service: Vascular;  Laterality: N/A;   VENA CAVA FILTER PLACEMENT N/A 05/16/2017   Procedure: RETRIEVAL INFERIOR VENA-CAVA FILTER;  Surgeon: Waynetta Sandy, MD;  Location: [place];  Service: Vascular;  Laterality: N/A;   VENOGRAM Bilateral 05/16/2017   Procedure: VENOGRAM OF IVC;  Surgeon: Waynetta Sandy, MD;  [facility];  Service: Vascular;  Laterality: Bilateral;   Social History   Occupational History   Not on file  Tobacco Use   Smoking status: Former   Smokeless tobacco: Never   Tobacco comments:    quit 25 years ago   Vaping Use   Vaping Use: Never used  Substance and Sexual Activity   Alcohol use: No   Drug use: No   Sexual activity: Not Currently

## 2022-07-13 ENCOUNTER — Ambulatory Visit (INDEPENDENT_AMBULATORY_CARE_PROVIDER_SITE_OTHER): Payer: Medicare Other | Admitting: Orthopedic Surgery

## 2022-07-13 ENCOUNTER — Ambulatory Visit (INDEPENDENT_AMBULATORY_CARE_PROVIDER_SITE_OTHER): Payer: Medicare Other

## 2022-07-13 ENCOUNTER — Encounter: Payer: Self-pay | Admitting: Orthopedic Surgery

## 2022-07-13 DIAGNOSIS — S82092A Other fracture of left patella, initial encounter for closed fracture: Secondary | ICD-10-CM

## 2022-07-13 NOTE — Progress Notes (Signed)
Office Visit Note   Patient: [name] May           Date of Birth: 09-02-1941           MRN: [id number] Visit Date: 07/13/2022 Requested by: Roselee Nova, MD [place],  [place] [address] PCP: Roselee Nova, MD  Subjective: Chief Complaint  Patient presents with   Left Knee - Follow-up   Right Shoulder - Follow-up    HPI: [name] May is a 81 year old patient with right shoulder and left knee pain.  She has good and bad days.  She is living with her daughter.  States she feels some better since last office visit.  Patella fracture date 05/24/2022.  Takes Norco as part of chronic pain management.  Has had injection in the right shoulder which is helped her symptoms.  She has home health physical therapy scheduled for her shoulder and knee.  Shoulder injection on 811 did help her symptoms.              ROS: All systems reviewed are negative as they relate to the chief complaint within the history of present illness.  Patient denies  fevers or chills.   Assessment & Plan: Visit Diagnoses:  1. Closed sleeve fracture of left patella, initial encounter     Plan: Impression is right shoulder rotator cuff arthropathy with improved shoulder abduction and forward flexion but still below of relatively functional level.  Left knee is doing very well with excellent extension strength and healed fracture on radiographs.  No effusion in the knee today.  Okay for weightbearing as tolerated with walker.  Continue with shoulder occupational and physical therapy to try to improve function.  Follow-up as needed.  Follow-Up Instructions: No follow-ups on file.   Orders:  Orders Placed This Encounter  Procedures   XR Knee 1-2 Views Left   No orders of the defined types were placed in this encounter.     Procedures: No procedures performed   Clinical Data: No additional findings.  Objective: Vital Signs: There were no vitals taken for this visit.  Physical Exam:    Constitutional: Patient appears well-developed HEENT:  Head: Normocephalic Eyes:EOM are normal Neck: Normal range of motion Cardiovascular: Normal rate Pulmonary/chest: Effort normal Neurologic: Patient is alert Skin: Skin is warm Psychiatric: Patient has normal mood and affect   Ortho Exam: Ortho exam demonstrates no real tenderness around the left patella.  No effusion in the left knee.  Collateral crucial ligaments are stable.  Extension strength 5+ out of 5 bilaterally.  Right shoulder has about 40 degrees of forward flexion and abduction with external rotation weakness consistent with her known diagnosis of rotator cuff arthropathy and tearing.  Specialty Comments:  No specialty comments available.  Imaging: XR Knee 1-2 Views Left  Result Date: 07/13/2022 AP lateral radiographs left knee reviewed.  Mild degenerative changes present.  Patella fracture has healed well with no displacement and visible callus across the fracture line.  No new fracture.    PMFS History: Patient Active Problem List   Diagnosis Date Noted   Closed nondisplaced transverse fracture of left patella    Fall 05/25/2022   Hemarthrosis of left knee 05/25/2022   Restless leg syndrome 05/25/2022   Chronic obstructive pulmonary disease ([place]) 05/25/2022   History of chronic obstructive pulmonary disease 05/25/2022   Fracture of vertebra due to osteoporosis ([place]) 02/23/2022   Meralgia paresthetica of left side 02/23/2022   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/09/2021  Pneumonia due to COVID-19 virus 07/01/2020   Macrocytic anemia 07/01/2020   Thrombocytopenia ([place]) 07/01/2020   Acute hypoxemic respiratory failure due to COVID-19 [facility]) 07/01/2020   Cellulitis of left lower extremity 04/30/2017   Cellulitis 04/30/2017   Edema of left lower extremity 04/23/2017   AKI (acute kidney injury) ([place]) 04/23/2017   ARF (acute renal failure) ([place]) 04/22/2017   DVT of lower extremity, bilateral ([place])  02/10/2015   PE (pulmonary embolism)    Shortness of breath    Normocytic anemia 02/07/2015   DVT (deep venous thrombosis) (H[facility])    Acute pulmonary embolism ([place]) 02/06/2015   Scoliosis of thoracic spine 02/06/2015   GERD (gastroesophageal reflux disease) 02/06/2015   Acute respiratory failure with hypoxia ([place]) 02/06/2015   Acute kidney injury ([place]) 02/06/2015   Elevated troponin 02/06/2015   Past Medical History:  Diagnosis Date   Allergy    Anemia    Arthritis    left knee   Back pain    Bipolar disorder (HCC)    Chronic headaches    COPD (chronic obstructive pulmonary disease) ([place])    Degenerative disorder of bone    Depression    Esophageal ulcer    history   GERD (gastroesophageal reflux disease)    Hyperlipidemia    Myeloma ([place])    Peripheral vascular disease ([place])    Pulmonary embolism ([place])    Scoliosis     Family History  Problem Relation Age of Onset   Deep vein thrombosis Neg Hx     Past Surgical History:  Procedure Laterality Date   ABDOMINAL HYSTERECTOMY     ANKLE SURGERY     COLONOSCOPY     EYE SURGERY     caratacs   FEMORAL ARTERY EXPLORATION  05/16/2017   Procedure: STENT OF BILATERAL COMMON FEMORAL VEIN WITH 16 X 90 WALL STENT;  Surgeon: Waynetta Sandy, MD;  Location: [place];  Service: Vascular;;   hemroidectomy     INSERTION OF ILIAC STENT Bilateral 05/16/2017   Procedure: STENT OF BILATERAL COMMON AND EXTERNAL ILIAC VEIN WITH 18 X 90 WALL STENT;  Surgeon: Waynetta Sandy, MD;  Location: [place];  Service: Vascular;  Laterality: Bilateral;   INTRAVASCULAR ULTRASOUND/IVUS N/A 05/06/2017   Procedure: INTRAVASCULAR ULTRASOUND/IVUS;  Surgeon: Waynetta Sandy, MD;  Location: [place] [place];  Service: Vascular;  Laterality: N/A;   IVC FILTER INSERTION N/A 05/16/2017   Procedure: MECHANICAL THROMBECTOMY OF IVC FILTER;  Surgeon: Waynetta Sandy, MD;  [facility];  Service: Vascular;  Laterality: N/A;   LAMINECTOMY     LOWER  EXTREMITY VENOGRAPHY N/A 05/14/2017   Procedure: Lower Extremity Venography;  Surgeon: Serafina Mitchell, MD;  Location: [place];  Service: Cardiovascular;  Laterality: N/A;   NASAL SINUS SURGERY     PERCUTANEOUS VENOUS THROMBECTOMY,LYSIS WITH INTRAVASCULAR ULTRASOUND (IVUS) N/A 05/16/2017   Procedure: PERCUTANEOUS VENOUS THROMBECTOMY AND LYSIS WITH INTRAVASCULAR ULTRASOUND (IVUS);  Surgeon: Waynetta Sandy, MD;  Location: [facility];  Service: Vascular;  Laterality: N/A;   VENA CAVA FILTER PLACEMENT N/A 05/16/2017   Procedure: RETRIEVAL INFERIOR VENA-CAVA FILTER;  Surgeon: Waynetta Sandy, MD;  Location: [facility];  Service: Vascular;  Laterality: N/A;   VENOGRAM Bilateral 05/16/2017   Procedure: VENOGRAM OF IVC;  Surgeon: Waynetta Sandy, MD;  [facility];  Service: Vascular;  Laterality: Bilateral;   Social History   Occupational History   Not on file  Tobacco Use   Smoking status: Former   Smokeless  tobacco: Never   Tobacco comments:    quit 25 years ago   Vaping Use   Vaping Use: Never used  Substance and Sexual Activity   Alcohol use: No   Drug use: No   Sexual activity: Not Currently

## 2022-07-16 LAB — DRUG TOX MONITOR 1 W/CONF, ORAL FLD
Alprazolam: NEGATIVE ng/mL (ref <0.50)
Amphetamines: NEGATIVE ng/mL (ref <10)
Barbiturates: NEGATIVE ng/mL (ref <10)
Benzodiazepines: POSITIVE ng/mL — AB (ref <0.50)
Buprenorphine: NEGATIVE ng/mL (ref <0.10)
Buprenorphine: NEGATIVE ng/mL (ref <0.10)
Chlordiazepoxide: NEGATIVE ng/mL (ref <0.50)
Clonazepam: NEGATIVE ng/mL (ref <0.50)
Cocaine: NEGATIVE ng/mL (ref <5.0)
Codeine: NEGATIVE ng/mL (ref <2.5)
Diazepam: NEGATIVE ng/mL (ref <0.50)
Dihydrocodeine: NEGATIVE ng/mL (ref <2.5)
Fentanyl: NEGATIVE ng/mL (ref <0.10)
Flunitrazepam: NEGATIVE ng/mL (ref <0.50)
Flurazepam: NEGATIVE ng/mL (ref <0.50)
Heroin Metabolite: NEGATIVE ng/mL (ref <1.0)
Hydrocodone: 52.4 ng/mL — ABNORMAL HIGH (ref <2.5)
Hydromorphone: NEGATIVE ng/mL (ref <2.5)
Lorazepam: 18.03 ng/mL — ABNORMAL HIGH (ref <0.50)
MARIJUANA: NEGATIVE ng/mL (ref <2.5)
MDMA: NEGATIVE ng/mL (ref <10)
Meprobamate: NEGATIVE ng/mL (ref <2.5)
Methadone: NEGATIVE ng/mL (ref <5.0)
Midazolam: NEGATIVE ng/mL (ref <0.50)
Morphine: NEGATIVE ng/mL (ref <2.5)
Nicotine Metabolite: NEGATIVE ng/mL (ref <5.0)
Norbuprenorphine: NEGATIVE ng/mL (ref <0.50)
Nordiazepam: NEGATIVE ng/mL (ref <0.50)
Norhydrocodone: 4.1 ng/mL — ABNORMAL HIGH (ref <2.5)
Noroxycodone: NEGATIVE ng/mL (ref <2.5)
Opiates: POSITIVE ng/mL — AB (ref <2.5)
Oxazepam: NEGATIVE ng/mL (ref <0.50)
Oxycodone: NEGATIVE ng/mL (ref <2.5)
Oxymorphone: NEGATIVE ng/mL (ref <2.5)
Phencyclidine: NEGATIVE ng/mL (ref <10)
Tapentadol: NEGATIVE ng/mL (ref <5.0)
Temazepam: NEGATIVE ng/mL (ref <0.50)
Tramadol: NEGATIVE ng/mL (ref <5.0)
Triazolam: NEGATIVE ng/mL (ref <0.50)
Zolpidem: NEGATIVE ng/mL (ref <5.0)

## 2022-07-16 LAB — DRUG TOX ALC METAB W/CON, ORAL FLD: Alcohol Metabolite: NEGATIVE ng/mL (ref <25)

## 2022-07-17 ENCOUNTER — Telehealth: Payer: Self-pay | Admitting: *Deleted

## 2022-07-17 NOTE — Telephone Encounter (Signed)
Oral swab drug screen was consistent for prescribed medications.  ?

## 2022-08-06 ENCOUNTER — Encounter: Payer: Self-pay | Admitting: Registered Nurse

## 2022-08-06 ENCOUNTER — Encounter: Payer: Medicare Other | Attending: Physical Medicine & Rehabilitation | Admitting: Registered Nurse

## 2022-08-06 VITALS — BP 115/70 | HR 85 | Ht <= 58 in | Wt 133.0 lb

## 2022-08-06 DIAGNOSIS — M47817 Spondylosis without myelopathy or radiculopathy, lumbosacral region: Secondary | ICD-10-CM | POA: Diagnosis present

## 2022-08-06 DIAGNOSIS — G894 Chronic pain syndrome: Secondary | ICD-10-CM | POA: Diagnosis present

## 2022-08-06 DIAGNOSIS — M25562 Pain in left knee: Secondary | ICD-10-CM | POA: Insufficient documentation

## 2022-08-06 DIAGNOSIS — Z79891 Long term (current) use of opiate analgesic: Secondary | ICD-10-CM | POA: Insufficient documentation

## 2022-08-06 DIAGNOSIS — G8929 Other chronic pain: Secondary | ICD-10-CM | POA: Diagnosis present

## 2022-08-06 DIAGNOSIS — M546 Pain in thoracic spine: Secondary | ICD-10-CM | POA: Diagnosis present

## 2022-08-06 DIAGNOSIS — Z5181 Encounter for therapeutic drug level monitoring: Secondary | ICD-10-CM | POA: Diagnosis present

## 2022-08-06 DIAGNOSIS — M25561 Pain in right knee: Secondary | ICD-10-CM | POA: Diagnosis present

## 2022-08-06 MED ORDER — HYDROCODONE-ACETAMINOPHEN 10-325 MG PO TABS: 1.0000 | ORAL_TABLET | Freq: Two times a day (BID) | ORAL | 0 refills | Status: DC | PRN

## 2022-08-06 NOTE — Progress Notes (Unsigned)
Subjective:    Patient ID: [name] May, female    DOB: October 20, 1941, 81 y.o.   MRN: [id number]  HPI: [name] May is a 81 y.o. female who returns for follow up appointment for chronic pain and medication refill. states *** pain is located in  ***. rates pain ***. current exercise regime is walking and performing stretching exercises.  Ms. Klingerman Morphine equivalent is *** MME.   She is also prescribed Lorazepam by Dr. Casimiro Needle .We have discussed the black box warning of using opioids and benzodiazepines. I highlighted the dangers of using these drugs together and discussed the adverse events including respiratory suppression, overdose, cognitive impairment and importance of compliance with current regimen. We will continue to monitor and adjust as indicated.  she is being closely monitored and under the care of her psychiatrist.  Last oral swab was performed on 07/12/2022, it was consistent.     Pain Inventory Average Pain 5 Pain Right Now 5 My pain is constant, tingling, and aching  In the last 24 hours, has pain interfered with the following? General activity 6 Relation with others 0 Enjoyment of life 5 What TIME of day is your pain at its worst? daytime Sleep (in general) Good  Pain is worse with: walking and standing Pain improves with: rest, heat/ice, and medication Relief from Meds: 8  Family History  Problem Relation Age of Onset   Deep vein thrombosis Neg Hx    Social History   Socioeconomic History   Marital status: Widowed    Spouse name: Not on file   Number of children: Not on file   Years of education: Not on file   Highest education level: Not on file  Occupational History   Not on file  Tobacco Use   Smoking status: Former   Smokeless tobacco: Never   Tobacco comments:    quit 25 years ago   Vaping Use   Vaping Use: Never used  Substance and Sexual Activity   Alcohol use: No   Drug use: No   Sexual activity: Not Currently  Other Topics  Concern   Not on file  Social History Narrative   Not on file   Social Determinants of Health   Financial Resource Strain: Not on file  Food Insecurity: Not on file  Transportation Needs: Not on file  Physical Activity: Not on file  Stress: Not on file  Social Connections: Not on file   Past Surgical History:  Procedure Laterality Date   ABDOMINAL HYSTERECTOMY     [place]  05/16/2017   Procedure: STENT OF BILATERAL COMMON FEMORAL VEIN WITH 16 X 90 WALL STENT;  Surgeon: Waynetta Sandy, MD;  Location: [facility];  Service: Vascular;;   hemroidectomy     INSERTION OF ILIAC STENT Bilateral 05/16/2017   Procedure: STENT OF BILATERAL COMMON AND EXTERNAL ILIAC VEIN WITH 18 X 90 WALL STENT;  Surgeon: Waynetta Sandy, MD;  Location: [facility];  Service: Vascular;  Laterality: Bilateral;   INTRAVASCULAR ULTRASOUND/IVUS N/A 05/06/2017   Procedure: INTRAVASCULAR ULTRASOUND/IVUS;  Surgeon: Waynetta Sandy, MD;  Location: [place];  Service: Vascular;  Laterality: N/A;   IVC FILTER INSERTION N/A 05/16/2017   Procedure: MECHANICAL THROMBECTOMY OF IVC FILTER;  Surgeon: Waynetta Sandy, MD;  Location: [place];  Service: Vascular;  Laterality: N/A;   LAMINECTOMY     LOWER  EXTREMITY VENOGRAPHY N/A 05/14/2017   Procedure: Lower Extremity Venography;  Surgeon: Serafina Mitchell, MD;  Location: [place] [facility];  Service: Cardiovascular;  Laterality: N/A;   NASAL SINUS SURGERY     PERCUTANEOUS VENOUS THROMBECTOMY,LYSIS WITH INTRAVASCULAR ULTRASOUND (IVUS) N/A 05/16/2017   Procedure: PERCUTANEOUS VENOUS THROMBECTOMY AND LYSIS WITH INTRAVASCULAR ULTRASOUND (IVUS);  Surgeon: Waynetta Sandy, MD;  Location: [place];  Service: Vascular;  Laterality: N/A;   VENA CAVA FILTER PLACEMENT N/A 05/16/2017   Procedure: RETRIEVAL INFERIOR VENA-CAVA FILTER;  Surgeon: Waynetta Sandy, MD;  Location: [place];   Service: Vascular;  Laterality: N/A;   VENOGRAM Bilateral 05/16/2017   Procedure: VENOGRAM OF IVC;  Surgeon: Waynetta Sandy, MD;  Location: [facility] OR;  Service: Vascular;  Laterality: Bilateral;   Past Surgical History:  Procedure Laterality Date   ABDOMINAL HYSTERECTOMY     ANKLE SURGERY     COLONOSCOPY     EYE SURGERY     caratacs   FEMORAL ARTERY EXPLORATION  05/16/2017   Procedure: STENT OF BILATERAL COMMON FEMORAL VEIN WITH 16 X 90 WALL STENT;  Surgeon: Waynetta Sandy, MD;  Location: [place];  Service: Vascular;;   hemroidectomy     INSERTION OF ILIAC STENT Bilateral 05/16/2017   Procedure: STENT OF BILATERAL COMMON AND EXTERNAL ILIAC VEIN WITH 18 X 90 WALL STENT;  Surgeon: Waynetta Sandy, MD;  Location: [place];  Service: Vascular;  Laterality: Bilateral;   INTRAVASCULAR ULTRASOUND/IVUS N/A 05/06/2017   Procedure: INTRAVASCULAR ULTRASOUND/IVUS;  Surgeon: Waynetta Sandy, MD;  Location: [place];  Service: Vascular;  Laterality: N/A;   IVC FILTER INSERTION N/A 05/16/2017   Procedure: MECHANICAL THROMBECTOMY OF IVC FILTER;  Surgeon: Waynetta Sandy, MD;  Location: [place];  Service: Vascular;  Laterality: N/A;   LAMINECTOMY     LOWER EXTREMITY VENOGRAPHY N/A 05/14/2017   Procedure: Lower Extremity Venography;  Surgeon: Serafina Mitchell, MD;  Location: [place] [facility];  Service: Cardiovascular;  Laterality: N/A;   NASAL SINUS SURGERY     PERCUTANEOUS VENOUS THROMBECTOMY,LYSIS WITH INTRAVASCULAR ULTRASOUND (IVUS) N/A 05/16/2017   Procedure: PERCUTANEOUS VENOUS THROMBECTOMY AND LYSIS WITH INTRAVASCULAR ULTRASOUND (IVUS);  Surgeon: Waynetta Sandy, MD;  Location: [place];  Service: Vascular;  Laterality: N/A;   VENA CAVA FILTER PLACEMENT N/A 05/16/2017   Procedure: RETRIEVAL INFERIOR VENA-CAVA FILTER;  Surgeon: Waynetta Sandy, MD;  Location: [place];  Service: Vascular;  Laterality: N/A;   VENOGRAM Bilateral 05/16/2017   Procedure: VENOGRAM OF  IVC;  Surgeon: Waynetta Sandy, MD;  Location: [facility] OR;  Service: Vascular;  Laterality: Bilateral;   Past Medical History:  Diagnosis Date   Allergy    Anemia    Arthritis    left knee   Back pain    Bipolar disorder (HCC)    Chronic headaches    COPD (chronic obstructive pulmonary disease) (HCC)    Degenerative disorder of bone    Depression    Esophageal ulcer    history   GERD (gastroesophageal reflux disease)    Hyperlipidemia    Myeloma (HCC)    Peripheral vascular disease ([place])    Pulmonary embolism (HCC)    Scoliosis    BP 115/70   Pulse 85   Ht '4\' 9"'$  (1.448 m)   Wt 133 lb (60.3 kg)   SpO2 91%   BMI 28.78 kg/m   Opioid Risk Score:   Fall Risk Score:  `1  Depression screen [facility] 2/9  08/06/2022    1:20 PM 07/12/2022   10:31 AM 06/14/2022   11:43 AM 02/23/2022   12:32 PM 12/26/2021    1:28 PM 11/28/2021    1:56 PM 10/03/2021    2:57 PM  Depression screen PHQ 2/9  Decreased Interest 1 1 0 0 0 0 0  Down, Depressed, Hopeless 1 1 0 0 0 0 0  PHQ - 2 Score 2 2 0 0 0 0 0     Review of Systems  Constitutional: Negative.   HENT: Negative.    Eyes: Negative.   Respiratory: Negative.    Cardiovascular: Negative.   Gastrointestinal: Negative.   Endocrine: Negative.   Genitourinary: Negative.   Musculoskeletal: Negative.   Skin: Negative.   Allergic/Immunologic: Negative.   Hematological: Negative.   Psychiatric/Behavioral: Negative.        Objective:   Physical Exam        Assessment & Plan:  Dr: Letta Pate Note was Reviewed.  1.  History of osteoporosis with multiple thoracic and lumbar compression fractures as well as bilateral rib fractures, patient is no longer symptomatic at these levels. We will continue to Monitor.   2.  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 Continue to Monitor. 07/12/2022  3. Left Knee knee pain: S/P Fall : Hospitalized  at [facility]  on 05/25/2022 For: 1.  Acute nondisplaced left patellar fracture with associated hemarthrosis: 2/2 to fall. Orthopedics recommended left knee immobilizer, [facility] following. Continue to monitor.  Continue HEP as Tolerated. Continue to Monitor. 07/12/2022 4.  Chronic pain syndrome multifactorial continue hydrocodone 10 mg 1 tablet twice a day as needed for pain.. We will continue the opioid monitoring program, this consists of regular clinic visits, examinations, urine drug screen, pill counts as well as use of [place] Controlled Substance Reporting system. A 12 month History has been reviewed on the [place] Controlled Substance Reporting System on 07/12/2022    5. Lumbosacral Spondylosis without Myelopathy: Continue HEP as Tolerated. Continue current medication regimen. Continue to Monitor. 07/12/2022 Oxygen Desaturation: O2 saturation repeated : She reports no chest pain and SOB. She refuses Ed or Urgent Care Evaluation. Pulmonolgy Following. Continue to Monitor.    F/U in 1 month with Dr Letta Pate

## 2022-09-14 ENCOUNTER — Encounter: Payer: Self-pay | Admitting: Registered Nurse

## 2022-09-14 ENCOUNTER — Encounter: Payer: Medicare Other | Attending: Physical Medicine & Rehabilitation | Admitting: Registered Nurse

## 2022-09-14 VITALS — BP 112/73 | HR 88 | Ht <= 58 in | Wt 134.0 lb

## 2022-09-14 DIAGNOSIS — G894 Chronic pain syndrome: Secondary | ICD-10-CM | POA: Diagnosis not present

## 2022-09-14 DIAGNOSIS — M47817 Spondylosis without myelopathy or radiculopathy, lumbosacral region: Secondary | ICD-10-CM | POA: Insufficient documentation

## 2022-09-14 DIAGNOSIS — G8929 Other chronic pain: Secondary | ICD-10-CM | POA: Insufficient documentation

## 2022-09-14 DIAGNOSIS — M25511 Pain in right shoulder: Secondary | ICD-10-CM | POA: Insufficient documentation

## 2022-09-14 DIAGNOSIS — Z79891 Long term (current) use of opiate analgesic: Secondary | ICD-10-CM | POA: Insufficient documentation

## 2022-09-14 DIAGNOSIS — M25552 Pain in left hip: Secondary | ICD-10-CM | POA: Insufficient documentation

## 2022-09-14 DIAGNOSIS — Z5181 Encounter for therapeutic drug level monitoring: Secondary | ICD-10-CM | POA: Diagnosis not present

## 2022-09-14 MED ORDER — HYDROCODONE-ACETAMINOPHEN 10-325 MG PO TABS: 1.0000 | ORAL_TABLET | Freq: Two times a day (BID) | ORAL | 0 refills | Status: DC | PRN

## 2022-09-14 NOTE — Progress Notes (Signed)
Subjective:    Patient ID: [name] May, female    DOB: 1941-07-13, 81 y.o.   MRN: [id number]  HPI: [name] May is a 81 y.o. female who returns for follow up appointment for chronic pain and medication refill. She states her pain is located in her  left hip  radiating into her left lower extremity and left knee pain. She rates her pain 5.Her  current exercise regime is walking short distances with her cane.  [name] May Morphine equivalent is 20.00 MME. She is also prescribed Lorazepam  by Dr. Casimiro Needle .We have discussed the black box warning of using opioids and benzodiazepines. I highlighted the dangers of using these drugs together and discussed the adverse events including respiratory suppression, overdose, cognitive impairment and importance of compliance with current regimen. We will continue to monitor and adjust as indicated.  she is being closely monitored and under the care of her psychiatrist.     Last Oral Swab was Performed on 07/12/2022, it was consistent.   Pain Inventory Average Pain 5 Pain Right Now 5 My pain is burning and tingling  In the last 24 hours, has pain interfered with the following? General activity 4 Relation with others 9 Enjoyment of life 8 What TIME of day is your pain at its worst? evening Sleep (in general) Good  Pain is worse with: unsure Pain improves with: heat/ice, pacing activities, and medication Relief from Meds: 7  Family History  Problem Relation Age of Onset   Deep vein thrombosis Neg Hx    Social History   Socioeconomic History   Marital status: Widowed    Spouse name: Not on file   Number of children: Not on file   Years of education: Not on file   Highest education level: Not on file  Occupational History   Not on file  Tobacco Use   Smoking status: Former   Smokeless tobacco: Never   Tobacco comments:    quit 25 years ago   Vaping Use   Vaping Use: Never used  Substance and Sexual Activity   Alcohol use: No    Drug use: No   Sexual activity: Not Currently  Other Topics Concern   Not on file  Social History Narrative   Not on file   Social Determinants of Health   Financial Resource Strain: Not on file  Food Insecurity: Not on file  Transportation Needs: Not on file  Physical Activity: Not on file  Stress: Not on file  Social Connections: Not on file   Past Surgical History:  Procedure Laterality Date   ABDOMINAL HYSTERECTOMY     [place]  05/16/2017   Procedure: STENT OF BILATERAL COMMON FEMORAL VEIN WITH 16 X 90 WALL STENT;  Surgeon: Waynetta Sandy, MD;  Location: [place];  Service: Vascular;;   hemroidectomy     INSERTION OF ILIAC STENT Bilateral 05/16/2017   Procedure: STENT OF BILATERAL COMMON AND EXTERNAL ILIAC VEIN WITH 18 X 90 WALL STENT;  Surgeon: Waynetta Sandy, MD;  Location: [place];  Service: Vascular;  Laterality: Bilateral;   INTRAVASCULAR ULTRASOUND/IVUS N/A 05/06/2017   Procedure: INTRAVASCULAR ULTRASOUND/IVUS;  Surgeon: Waynetta Sandy, MD;  Location: [place];  Service: Vascular;  Laterality: N/A;   IVC FILTER INSERTION N/A 05/16/2017   Procedure: MECHANICAL THROMBECTOMY OF IVC FILTER;  Surgeon: Waynetta Sandy, MD;  [facility];  Service: Vascular;  Laterality: N/A;   LAMINECTOMY     LOWER EXTREMITY VENOGRAPHY N/A 05/14/2017   Procedure: Lower Extremity Venography;  Surgeon: Serafina Mitchell, MD;  Location: [place] [facility];  Service: Cardiovascular;  Laterality: N/A;   NASAL SINUS SURGERY     PERCUTANEOUS VENOUS THROMBECTOMY,LYSIS WITH INTRAVASCULAR ULTRASOUND (IVUS) N/A 05/16/2017   Procedure: PERCUTANEOUS VENOUS THROMBECTOMY AND LYSIS WITH INTRAVASCULAR ULTRASOUND (IVUS);  Surgeon: Waynetta Sandy, MD;  Location: [place];  Service: Vascular;  Laterality: N/A;   VENA CAVA FILTER PLACEMENT N/A 05/16/2017   Procedure: RETRIEVAL INFERIOR VENA-CAVA  FILTER;  Surgeon: Waynetta Sandy, MD;  Location: [place];  Service: Vascular;  Laterality: N/A;   VENOGRAM Bilateral 05/16/2017   Procedure: VENOGRAM OF IVC;  Surgeon: Waynetta Sandy, MD;  [facility];  Service: Vascular;  Laterality: Bilateral;   Past Surgical History:  Procedure Laterality Date   ABDOMINAL HYSTERECTOMY     ANKLE SURGERY     COLONOSCOPY     EYE SURGERY     caratacs   FEMORAL ARTERY EXPLORATION  05/16/2017   Procedure: STENT OF BILATERAL COMMON FEMORAL VEIN WITH 16 X 90 WALL STENT;  Surgeon: Waynetta Sandy, MD;  Location: [place];  Service: Vascular;;   hemroidectomy     INSERTION OF ILIAC STENT Bilateral 05/16/2017   Procedure: STENT OF BILATERAL COMMON AND EXTERNAL ILIAC VEIN WITH 18 X 90 WALL STENT;  Surgeon: Waynetta Sandy, MD;  Location: [place];  Service: Vascular;  Laterality: Bilateral;   INTRAVASCULAR ULTRASOUND/IVUS N/A 05/06/2017   Procedure: INTRAVASCULAR ULTRASOUND/IVUS;  Surgeon: Waynetta Sandy, MD;  Location: [place];  Service: Vascular;  Laterality: N/A;   IVC FILTER INSERTION N/A 05/16/2017   Procedure: MECHANICAL THROMBECTOMY OF IVC FILTER;  Surgeon: Waynetta Sandy, MD;  Location: [place];  Service: Vascular;  Laterality: N/A;   LAMINECTOMY     LOWER EXTREMITY VENOGRAPHY N/A 05/14/2017   Procedure: Lower Extremity Venography;  Surgeon: Serafina Mitchell, MD;  Location: [place] [facility];  Service: Cardiovascular;  Laterality: N/A;   NASAL SINUS SURGERY     PERCUTANEOUS VENOUS THROMBECTOMY,LYSIS WITH INTRAVASCULAR ULTRASOUND (IVUS) N/A 05/16/2017   Procedure: PERCUTANEOUS VENOUS THROMBECTOMY AND LYSIS WITH INTRAVASCULAR ULTRASOUND (IVUS);  Surgeon: Waynetta Sandy, MD;  Location: [place];  Service: Vascular;  Laterality: N/A;   VENA CAVA FILTER PLACEMENT N/A 05/16/2017   Procedure: RETRIEVAL INFERIOR VENA-CAVA FILTER;  Surgeon: Waynetta Sandy, MD;  Location: [place];  Service: Vascular;   Laterality: N/A;   VENOGRAM Bilateral 05/16/2017   Procedure: VENOGRAM OF IVC;  Surgeon: Waynetta Sandy, MD;  Location: [place];  Service: Vascular;  Laterality: Bilateral;   Past Medical History:  Diagnosis Date   Allergy    Anemia    Arthritis    left knee   Back pain    Bipolar disorder (HCC)    Chronic headaches    COPD (chronic obstructive pulmonary disease) ([place])    Degenerative disorder of bone    Depression    Esophageal ulcer    history   GERD (gastroesophageal reflux disease)    Hyperlipidemia    Myeloma ([place])    Peripheral vascular disease ([place])    Pulmonary embolism ([place])    Scoliosis    There were no vitals taken for this visit.  Opioid Risk Score:   Fall Risk Score:  `1  Depression screen [facility] 2/9     08/06/2022    1:20  PM 07/12/2022   10:31 AM 06/14/2022   11:43 AM 02/23/2022   12:32 PM 12/26/2021    1:28 PM 11/28/2021    1:56 PM 10/03/2021    2:57 PM  Depression screen [facility] 2/9  Decreased Interest 1 1 0 0 0 0 0  Down, Depressed, Hopeless 1 1 0 0 0 0 0  PHQ - 2 Score 2 2 0 0 0 0 0      Review of Systems  Constitutional: Negative.   Eyes: Negative.   Respiratory: Negative.    Endocrine: Negative.   Genitourinary: Negative.   Musculoskeletal: Negative.   Allergic/Immunologic: Negative.   Neurological: Negative.   Hematological: Negative.   Psychiatric/Behavioral: Negative.         Objective:   Physical Exam Vitals and nursing note reviewed.  Constitutional:      Appearance: Normal appearance.  Cardiovascular:     Rate and Rhythm: Normal rate and regular rhythm.     Pulses: Normal pulses.     Heart sounds: Normal heart sounds.  Musculoskeletal:     Cervical back: Normal range of motion and neck supple.     Comments: Normal Muscle Bulk and Muscle Testing Reveals:  Upper Extremities: Full ROM and Muscle Strength 5/5  Lower Extremities: Full ROM and Muscle Strength 5/5 Left Greater Trochanter Tenderness Arises from chair slowly,  using cane for support Narrow Based  Gait     Skin:    General: Skin is warm and dry.  Neurological:     Mental Status: She is alert and oriented to person, place, and time.  Psychiatric:        Mood and Affect: Mood normal.        Behavior: Behavior normal.         Assessment & Plan:  1.  History of osteoporosis with multiple thoracic and lumbar compression fractures as well as bilateral rib fractures, patient is no longer symptomatic at these levels. We will continue to Monitor. 09/14/2022  2.  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 Continue to Monitor. 09/14/2022  3. Left Knee knee pain: S/P Fall : Hospitalized  at [facility] on 05/25/2022 For: 1.  Acute nondisplaced left patellar fracture with associated hemarthrosis: 2/2 to fall. Orthopedics recommended left knee immobilizer, WBAT, Has Therapy weekly.  Orthopedics following. Continue to monitor.  Continue HEP as Tolerated. Continue to Monitor. 09/14/2022 4.  Chronic pain syndrome multifactorial continue hydrocodone 10 mg /325 1 tablet twice a day as needed for pain #60.Marland Kitchen We will continue the opioid monitoring program, this consists of regular clinic visits, examinations, urine drug screen, pill counts as well as use of [place] Controlled Substance Reporting system. A 12 month History has been reviewed on the [place] Controlled Substance Reporting System on 09/14/2022    5. Lumbosacral Spondylosis without Myelopathy: Continue HEP as Tolerated. Continue current medication regimen. Continue to Monitor. 09/14/2022 6. Chronic Right Shoulder Pain: Continue HEP as Tolerated. Continue to monitor.  7. Chronic Left Hip Pain: Continue HEP as tolerated. Continue current medication regimen. Continue to monitor.   F/U in 1 month

## 2022-10-09 ENCOUNTER — Telehealth: Payer: Self-pay | Admitting: *Deleted

## 2022-10-09 MED ORDER — HYDROCODONE-ACETAMINOPHEN 10-325 MG PO TABS: 1.0000 | ORAL_TABLET | Freq: Two times a day (BID) | ORAL | 0 refills | Status: DC | PRN

## 2022-10-09 NOTE — Telephone Encounter (Signed)
PMP was Reviewed.  Hydrocodone e-scribed to pharmacy.  Call placed to [name] May regarding the above, she verbalizes understanding.

## 2022-10-09 NOTE — Telephone Encounter (Signed)
Mrs Hildebrant reports she was to call Zella Ball by last week in November about her medication before Zella Ball is on PAL.

## 2022-10-25 ENCOUNTER — Encounter: Payer: Medicare Other | Admitting: Registered Nurse

## 2022-10-26 ENCOUNTER — Encounter: Payer: Medicare Other | Admitting: Registered Nurse

## 2022-11-01 ENCOUNTER — Encounter: Payer: Medicare Other | Attending: Physical Medicine & Rehabilitation | Admitting: Registered Nurse

## 2022-11-01 ENCOUNTER — Encounter: Payer: Self-pay | Admitting: Registered Nurse

## 2022-11-01 VITALS — BP 125/87 | HR 96 | Ht <= 58 in | Wt 133.0 lb

## 2022-11-01 DIAGNOSIS — M5416 Radiculopathy, lumbar region: Secondary | ICD-10-CM | POA: Diagnosis present

## 2022-11-01 DIAGNOSIS — M25552 Pain in left hip: Secondary | ICD-10-CM | POA: Insufficient documentation

## 2022-11-01 DIAGNOSIS — G8929 Other chronic pain: Secondary | ICD-10-CM | POA: Insufficient documentation

## 2022-11-01 DIAGNOSIS — Z79891 Long term (current) use of opiate analgesic: Secondary | ICD-10-CM | POA: Diagnosis present

## 2022-11-01 DIAGNOSIS — G894 Chronic pain syndrome: Secondary | ICD-10-CM | POA: Insufficient documentation

## 2022-11-01 DIAGNOSIS — M25511 Pain in right shoulder: Secondary | ICD-10-CM | POA: Insufficient documentation

## 2022-11-01 DIAGNOSIS — Z5181 Encounter for therapeutic drug level monitoring: Secondary | ICD-10-CM | POA: Diagnosis present

## 2022-11-01 DIAGNOSIS — M47817 Spondylosis without myelopathy or radiculopathy, lumbosacral region: Secondary | ICD-10-CM | POA: Diagnosis present

## 2022-11-01 NOTE — Patient Instructions (Signed)
Call Office the Week of January 15th: Regarding your Hydrocodone

## 2022-11-01 NOTE — Progress Notes (Signed)
Subjective:    Patient ID: [name] May, female    DOB: 1941/06/17, 81 y.o.   MRN: [id number]  HPI: [name] May is a 81 y.o. female who returns for follow up appointment for chronic pain and medication refill. She states her pain is located in her right shoulder, lower back pain radiating into her left hip at times and left lower extremity. She rates her pain 4. Her current exercise regime is walking short distances.  Ms. Haglund Morphine equivalent is 20.00 MME. She is also prescribed Lorazepam  by Dr. Casimiro Needle .We have discussed the black box warning of using opioids and benzodiazepines. I highlighted the dangers of using these drugs together and discussed the adverse events including respiratory suppression, overdose, cognitive impairment and importance of compliance with current regimen. We will continue to monitor and adjust as indicated.  she is being closely monitored and under the care of her psychiatrist.    Last Oral Swab was Performed on 07/12/2022, it was consistent.      Pain Inventory Average Pain 7 Pain Right Now 4 My pain is constant, burning, and tingling  In the last 24 hours, has pain interfered with the following? General activity 3 Relation with others 0 Enjoyment of life 0 What TIME of day is your pain at its worst? varies Sleep (in general) Good  Pain is worse with: sitting and some activites Pain improves with: medication and rest Relief from Meds: 7  Family History  Problem Relation Age of Onset   Deep vein thrombosis Neg Hx    Social History   Socioeconomic History   Marital status: Widowed    Spouse name: Not on file   Number of children: Not on file   Years of education: Not on file   Highest education level: Not on file  Occupational History   Not on file  Tobacco Use   Smoking status: Former   Smokeless tobacco: Never   Tobacco comments:    quit 25 years ago   Vaping Use   Vaping Use: Never used  Substance and Sexual Activity    Alcohol use: No   Drug use: No   Sexual activity: Not Currently  Other Topics Concern   Not on file  Social History Narrative   Not on file   Social Determinants of Health   Financial Resource Strain: Not on file  Food Insecurity: Not on file  Transportation Needs: Not on file  Physical Activity: Not on file  Stress: Not on file  Social Connections: Not on file   Past Surgical History:  Procedure Laterality Date   ABDOMINAL HYSTERECTOMY     [place]  05/16/2017   Procedure: STENT OF BILATERAL COMMON FEMORAL VEIN WITH 16 X 90 WALL STENT;  Surgeon: Waynetta Sandy, MD;  Location: [place];  Service: Vascular;;   hemroidectomy     INSERTION OF ILIAC STENT Bilateral 05/16/2017   Procedure: STENT OF BILATERAL COMMON AND EXTERNAL ILIAC VEIN WITH 18 X 90 WALL STENT;  Surgeon: Waynetta Sandy, MD;  Location: [place];  Service: Vascular;  Laterality: Bilateral;   INTRAVASCULAR ULTRASOUND/IVUS N/A 05/06/2017   Procedure: INTRAVASCULAR ULTRASOUND/IVUS;  Surgeon: Waynetta Sandy, MD;  Location: [place];  Service: Vascular;  Laterality: N/A;   IVC FILTER INSERTION N/A 05/16/2017   Procedure: MECHANICAL THROMBECTOMY OF IVC FILTER;  Surgeon: Waynetta Sandy,  MD;  [facility];  Service: Vascular;  Laterality: N/A;   LAMINECTOMY     LOWER EXTREMITY VENOGRAPHY N/A 05/14/2017   Procedure: Lower Extremity Venography;  Surgeon: Serafina Mitchell, MD;  Location: [place] [facility];  Service: Cardiovascular;  Laterality: N/A;   NASAL SINUS SURGERY     PERCUTANEOUS VENOUS THROMBECTOMY,LYSIS WITH INTRAVASCULAR ULTRASOUND (IVUS) N/A 05/16/2017   Procedure: PERCUTANEOUS VENOUS THROMBECTOMY AND LYSIS WITH INTRAVASCULAR ULTRASOUND (IVUS);  Surgeon: Waynetta Sandy, MD;  Location: [place];  Service: Vascular;  Laterality: N/A;   VENA CAVA FILTER PLACEMENT N/A 05/16/2017   Procedure: RETRIEVAL  INFERIOR VENA-CAVA FILTER;  Surgeon: Waynetta Sandy, MD;  Location: [place];  Service: Vascular;  Laterality: N/A;   VENOGRAM Bilateral 05/16/2017   Procedure: VENOGRAM OF IVC;  Surgeon: Waynetta Sandy, MD;  Location: [facility];  Service: Vascular;  Laterality: Bilateral;   Past Surgical History:  Procedure Laterality Date   ABDOMINAL HYSTERECTOMY     ANKLE SURGERY     COLONOSCOPY     EYE SURGERY     caratacs   FEMORAL ARTERY EXPLORATION  05/16/2017   Procedure: STENT OF BILATERAL COMMON FEMORAL VEIN WITH 16 X 90 WALL STENT;  Surgeon: Waynetta Sandy, MD;  Location: [place];  Service: Vascular;;   hemroidectomy     INSERTION OF ILIAC STENT Bilateral 05/16/2017   Procedure: STENT OF BILATERAL COMMON AND EXTERNAL ILIAC VEIN WITH 18 X 90 WALL STENT;  Surgeon: Waynetta Sandy, MD;  Location: [place];  Service: Vascular;  Laterality: Bilateral;   INTRAVASCULAR ULTRASOUND/IVUS N/A 05/06/2017   Procedure: INTRAVASCULAR ULTRASOUND/IVUS;  Surgeon: Waynetta Sandy, MD;  Location: [place];  Service: Vascular;  Laterality: N/A;   IVC FILTER INSERTION N/A 05/16/2017   Procedure: MECHANICAL THROMBECTOMY OF IVC FILTER;  Surgeon: Waynetta Sandy, MD;  Location: [place];  Service: Vascular;  Laterality: N/A;   LAMINECTOMY     LOWER EXTREMITY VENOGRAPHY N/A 05/14/2017   Procedure: Lower Extremity Venography;  Surgeon: Serafina Mitchell, MD;  Location: [place] [facility];  Service: Cardiovascular;  Laterality: N/A;   NASAL SINUS SURGERY     PERCUTANEOUS VENOUS THROMBECTOMY,LYSIS WITH INTRAVASCULAR ULTRASOUND (IVUS) N/A 05/16/2017   Procedure: PERCUTANEOUS VENOUS THROMBECTOMY AND LYSIS WITH INTRAVASCULAR ULTRASOUND (IVUS);  Surgeon: Waynetta Sandy, MD;  Location: [place];  Service: Vascular;  Laterality: N/A;   VENA CAVA FILTER PLACEMENT N/A 05/16/2017   Procedure: RETRIEVAL INFERIOR VENA-CAVA FILTER;  Surgeon: Waynetta Sandy, MD;  Location: [place];  Service:  Vascular;  Laterality: N/A;   VENOGRAM Bilateral 05/16/2017   Procedure: VENOGRAM OF IVC;  Surgeon: Waynetta Sandy, MD;  Location: [place];  Service: Vascular;  Laterality: Bilateral;   Past Medical History:  Diagnosis Date   Allergy    Anemia    Arthritis    left knee   Back pain    Bipolar disorder (HCC)    Chronic headaches    COPD (chronic obstructive pulmonary disease) ([place])    Degenerative disorder of bone    Depression    Esophageal ulcer    history   GERD (gastroesophageal reflux disease)    Hyperlipidemia    Myeloma ([place])    Peripheral vascular disease ([place])    Pulmonary embolism ([place])    Scoliosis    There were no vitals taken for this visit.  Opioid Risk Score:   Fall Risk Score:  `1  Depression screen [facility] 2/9     08/06/2022  1:20 PM 07/12/2022   10:31 AM 06/14/2022   11:43 AM 02/23/2022   12:32 PM 12/26/2021    1:28 PM 11/28/2021    1:56 PM 10/03/2021    2:57 PM  Depression screen PHQ 2/9  Decreased Interest 1 1 0 0 0 0 0  Down, Depressed, Hopeless 1 1 0 0 0 0 0  PHQ - 2 Score 2 2 0 0 0 0 0    Review of Systems  Musculoskeletal:        Left hip pain down to knee (on the out side)  All other systems reviewed and are negative.      Objective:   Physical Exam Vitals and nursing note reviewed.  Constitutional:      Appearance: Normal appearance.  Cardiovascular:     Rate and Rhythm: Normal rate and regular rhythm.     Pulses: Normal pulses.     Heart sounds: Normal heart sounds.  Pulmonary:     Effort: Pulmonary effort is normal.     Breath sounds: Normal breath sounds.  Musculoskeletal:     Cervical back: Normal range of motion and neck supple.     Comments: Normal Muscle Bulk and Muscle Testing Reveals:  Upper Extremities: Right: Decreased ROM 20 Degrees and Muscle Strength 5/5 Right AC Joint Tenderness Left Upper Extremity: Full ROM and Muscle Strength 5/5 Lumbar Paraspinal Tenderness: L-4-L-5 Lower Extremities: Full ROM and Muscle  Strength 5/5 Left Lower Extremity Flexion Produces Pain into Left Patella Arises from Table slowly Antalgic Gait     Skin:    General: Skin is warm and dry.  Neurological:     Mental Status: She is alert and oriented to person, place, and time.  Psychiatric:        Mood and Affect: Mood normal.        Behavior: Behavior normal.         Assessment & Plan:  1.  History of osteoporosis with multiple thoracic and lumbar compression fractures as well as bilateral rib fractures, patient is no longer symptomatic at these levels. We will continue to Monitor. 11/01/2022  2.  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 Continue to Monitor. 11/01/2022  3. Left Knee knee pain: S/P Fall : Hospitalized  at [facility] on 05/25/2022 For: 1.  Acute nondisplaced left patellar fracture with associated hemarthrosis: 2/2 to fall. Orthopedics recommended left knee immobilizer, WBAT, Has Therapy weekly.  Orthopedics following. Continue to monitor.  Continue HEP as Tolerated. Continue to Monitor. 11/01/2022 4.  Chronic pain syndrome multifactorial continue hydrocodone 10 mg /325 1 tablet twice a day as needed for pain #60.Marland Kitchen We will continue the opioid monitoring program, this consists of regular clinic visits, examinations, urine drug screen, pill counts as well as use of [place] Controlled Substance Reporting system. A 12 month History has been reviewed on the [place] Controlled Substance Reporting System on 11/01/2022    5. Lumbosacral Spondylosis without Myelopathy: Continue HEP as Tolerated. Continue current medication regimen. Continue to Monitor. 11/01/2022 6. Chronic Right Shoulder Pain: Continue HEP as Tolerated. Continue to monitor. 11/01/2022 7. Chronic Left Hip Pain: Continue HEP as tolerated. Continue current medication regimen. Continue to monitor. 11/01/2022   F/U in 1 month

## 2022-12-04 ENCOUNTER — Telehealth: Payer: Self-pay | Admitting: Registered Nurse

## 2022-12-04 NOTE — Telephone Encounter (Signed)
Patient would like for Zella Ball to call her about her medication.  Please call [phone].

## 2022-12-04 NOTE — Telephone Encounter (Signed)
Return [name] May call, she states she has hydrocodone and doesn't need a refill at this time.

## 2022-12-10 NOTE — Progress Notes (Unsigned)
Subjective:    Patient ID: [name] May, female    DOB: 08-04-1941, 82 y.o.   MRN: [id number]  HPI: [name] May is a 82 y.o. female who returns for follow up appointment for chronic pain and medication refill. She states her pain is located in her right shoulder, mid- lower back pain radiating into her left lower extremity and Left knee pain. She rates her  pain 4. Her current exercise regime is walking and performing stretching exercises.  Ms. Carder Morphine equivalent is 20.00 MME.   UDS ordered today.      Pain Inventory Average Pain 7 Pain Right Now 4 My pain is constant, burning, and tingling  In the last 24 hours, has pain interfered with the following? General activity 3 Relation with others 0 Enjoyment of life 0 What TIME of day is your pain at its worst? varies Sleep (in general) Good  Pain is worse with: sitting and some activites Pain improves with: rest and medication Relief from Meds: 7  Family History  Problem Relation Age of Onset   Deep vein thrombosis Neg Hx    Social History   Socioeconomic History   Marital status: Widowed    Spouse name: Not on file   Number of children: Not on file   Years of education: Not on file   Highest education level: Not on file  Occupational History   Not on file  Tobacco Use   Smoking status: Former   Smokeless tobacco: Never   Tobacco comments:    quit 25 years ago   Vaping Use   Vaping Use: Never used  Substance and Sexual Activity   Alcohol use: No   Drug use: No   Sexual activity: Not Currently  Other Topics Concern   Not on file  Social History Narrative   Not on file   Social Determinants of Health   Financial Resource Strain: Not on file  Food Insecurity: Not on file  Transportation Needs: Not on file  Physical Activity: Not on file  Stress: Not on file  Social Connections: Not on file   Past Surgical History:  Procedure Laterality Date   ABDOMINAL HYSTERECTOMY     [place]  05/16/2017   Procedure: STENT OF BILATERAL COMMON FEMORAL VEIN WITH 16 X 90 WALL STENT;  Surgeon: Waynetta Sandy, MD;  Location: [place];  Service: Vascular;;   hemroidectomy     INSERTION OF ILIAC STENT Bilateral 05/16/2017   Procedure: STENT OF BILATERAL COMMON AND EXTERNAL ILIAC VEIN WITH 18 X 90 WALL STENT;  Surgeon: Waynetta Sandy, MD;  Location: [place];  Service: Vascular;  Laterality: Bilateral;   INTRAVASCULAR ULTRASOUND/IVUS N/A 05/06/2017   Procedure: INTRAVASCULAR ULTRASOUND/IVUS;  Surgeon: Waynetta Sandy, MD;  Location: [place];  Service: Vascular;  Laterality: N/A;   IVC FILTER INSERTION N/A 05/16/2017   Procedure: MECHANICAL THROMBECTOMY OF IVC FILTER;  Surgeon: Waynetta Sandy, MD;  Location: [place];  Service: Vascular;  Laterality: N/A;   LAMINECTOMY     LOWER EXTREMITY VENOGRAPHY N/A 05/14/2017   Procedure: Lower Extremity Venography;  Surgeon: Serafina Mitchell, MD;  Location: [place] [facility];  Service: Cardiovascular;  Laterality: N/A;   NASAL SINUS SURGERY     PERCUTANEOUS VENOUS THROMBECTOMY,LYSIS WITH INTRAVASCULAR ULTRASOUND (IVUS) N/A 05/16/2017   Procedure: PERCUTANEOUS VENOUS THROMBECTOMY AND LYSIS WITH INTRAVASCULAR  ULTRASOUND (IVUS);  Surgeon: Waynetta Sandy, MD;  Location: [place];  Service: Vascular;  Laterality: N/A;   VENA CAVA FILTER PLACEMENT N/A 05/16/2017   Procedure: RETRIEVAL INFERIOR VENA-CAVA FILTER;  Surgeon: Waynetta Sandy, MD;  Location: [place];  Service: Vascular;  Laterality: N/A;   VENOGRAM Bilateral 05/16/2017   Procedure: VENOGRAM OF IVC;  Surgeon: Waynetta Sandy, MD;  Location: [facility];  Service: Vascular;  Laterality: Bilateral;   Past Surgical History:  Procedure Laterality Date   ABDOMINAL HYSTERECTOMY     ANKLE SURGERY     COLONOSCOPY     EYE SURGERY     caratacs   FEMORAL ARTERY EXPLORATION  05/16/2017    Procedure: STENT OF BILATERAL COMMON FEMORAL VEIN WITH 16 X 90 WALL STENT;  Surgeon: Waynetta Sandy, MD;  Location: [place];  Service: Vascular;;   hemroidectomy     INSERTION OF ILIAC STENT Bilateral 05/16/2017   Procedure: STENT OF BILATERAL COMMON AND EXTERNAL ILIAC VEIN WITH 18 X 90 WALL STENT;  Surgeon: Waynetta Sandy, MD;  Location: [place];  Service: Vascular;  Laterality: Bilateral;   INTRAVASCULAR ULTRASOUND/IVUS N/A 05/06/2017   Procedure: INTRAVASCULAR ULTRASOUND/IVUS;  Surgeon: Waynetta Sandy, MD;  Location: [place];  Service: Vascular;  Laterality: N/A;   IVC FILTER INSERTION N/A 05/16/2017   Procedure: MECHANICAL THROMBECTOMY OF IVC FILTER;  Surgeon: Waynetta Sandy, MD;  Location: [place];  Service: Vascular;  Laterality: N/A;   LAMINECTOMY     LOWER EXTREMITY VENOGRAPHY N/A 05/14/2017   Procedure: Lower Extremity Venography;  Surgeon: Serafina Mitchell, MD;  Location: [place] [facility];  Service: Cardiovascular;  Laterality: N/A;   NASAL SINUS SURGERY     PERCUTANEOUS VENOUS THROMBECTOMY,LYSIS WITH INTRAVASCULAR ULTRASOUND (IVUS) N/A 05/16/2017   Procedure: PERCUTANEOUS VENOUS THROMBECTOMY AND LYSIS WITH INTRAVASCULAR ULTRASOUND (IVUS);  Surgeon: Waynetta Sandy, MD;  Location: [place];  Service: Vascular;  Laterality: N/A;   VENA CAVA FILTER PLACEMENT N/A 05/16/2017   Procedure: RETRIEVAL INFERIOR VENA-CAVA FILTER;  Surgeon: Waynetta Sandy, MD;  Location: [place];  Service: Vascular;  Laterality: N/A;   VENOGRAM Bilateral 05/16/2017   Procedure: VENOGRAM OF IVC;  Surgeon: Waynetta Sandy, MD;  Location: [place];  Service: Vascular;  Laterality: Bilateral;   Past Medical History:  Diagnosis Date   Allergy    Anemia    Arthritis    left knee   Back pain    Bipolar disorder (HCC)    Chronic headaches    COPD (chronic obstructive pulmonary disease) ([place])    Degenerative disorder of bone    Depression    Esophageal ulcer     history   GERD (gastroesophageal reflux disease)    Hyperlipidemia    Myeloma ([place])    Peripheral vascular disease ([place])    Pulmonary embolism ([place])    Scoliosis    There were no vitals taken for this visit.  Opioid Risk Score:   Fall Risk Score:  `1  Depression screen PHQ 2/9     11/01/2022    1:37 PM 08/06/2022    1:20 PM 07/12/2022   10:31 AM 06/14/2022   11:43 AM 02/23/2022   12:32 PM 12/26/2021    1:28 PM 11/28/2021    1:56 PM  Depression screen PHQ 2/9  Decreased Interest '1 1 1 '$ 0 0 0 0  Down, Depressed, Hopeless '1 1 1 '$ 0 0 0 0  PHQ - 2 Score '2 2 2 '$ 0 0 0 0  Review of Systems  Musculoskeletal:  Positive for back pain and gait problem.  All other systems reviewed and are negative.     Objective:   Physical Exam Vitals and nursing note reviewed.  Constitutional:      Appearance: Normal appearance.  Cardiovascular:     Rate and Rhythm: Normal rate and regular rhythm.     Pulses: Normal pulses.     Heart sounds: Normal heart sounds.  Pulmonary:     Effort: Pulmonary effort is normal.     Breath sounds: Normal breath sounds.  Musculoskeletal:     Cervical back: Normal range of motion and neck supple.     Comments: Normal Muscle Bulk and Muscle Testing Reveals:  Upper Extremities: Right: Decreased ROM 30 Degrees  and Muscle Strength  4/5 Left Upper Extremity: Full ROM and Muscle Strength 5/5 Thoracic  Paraspinal Tenderness: T-!0- T-12  Lumbar Paraspinal Tenderness: L-4-L-5 Lower Extremities: Full ROM and Muscle Strength 5/5 Arises from Table Slowly using cane for support Antalgic  Gait     Skin:    General: Skin is warm and dry.  Neurological:     Mental Status: She is alert and oriented to person, place, and time.  Psychiatric:        Mood and Affect: Mood normal.        Behavior: Behavior normal.         Assessment & Plan:  1.  History of osteoporosis with multiple thoracic and lumbar compression fractures as well as bilateral rib fractures, patient is  no longer symptomatic at these levels. We will continue to Monitor. 12/12/2022  2.  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 Continue to Monitor. 12/12/2022  3. Bilateral Knee Pain/ Left Knee knee pain: S/P Fall : Hospitalized  at [facility] on 05/25/2022 For: 1.  Acute nondisplaced left patellar fracture with associated hemarthrosis: 2/2 to fall. Orthopedics recommended left knee immobilizer, WBAT, Has Therapy weekly.  Orthopedics following. No falls this month. Continue to monitor. Continue HEP as Tolerated. Continue to Monitor. 12/12/2022 4.  Chronic pain syndrome multifactorial continue hydrocodone 10 mg /325 1 tablet twice a day as needed for pain #60.Marland Kitchen We will continue the opioid monitoring program, this consists of regular clinic visits, examinations, urine drug screen, pill counts as well as use of [place] Controlled Substance Reporting system. A 12 month History has been reviewed on the [place] Controlled Substance Reporting System on 12/12/2022   5. Lumbosacral Spondylosis without Myelopathy: Continue HEP as Tolerated. Continue current medication regimen. Continue to Monitor. 12/12/2022 6. Chronic Right Shoulder Pain: Continue HEP as Tolerated. Continue to monitor. 12/12/2022 7. Chronic Left Hip Pain: Continue HEP as tolerated. Continue current medication regimen. Continue to monitor. 12/12/2022   F/U in 2 months

## 2022-12-12 ENCOUNTER — Encounter: Payer: Self-pay | Admitting: Registered Nurse

## 2022-12-12 ENCOUNTER — Encounter: Payer: 59 | Attending: Physical Medicine & Rehabilitation | Admitting: Registered Nurse

## 2022-12-12 VITALS — BP 126/74 | HR 97 | Ht <= 58 in | Wt 133.0 lb

## 2022-12-12 DIAGNOSIS — M546 Pain in thoracic spine: Secondary | ICD-10-CM | POA: Insufficient documentation

## 2022-12-12 DIAGNOSIS — M25561 Pain in right knee: Secondary | ICD-10-CM | POA: Insufficient documentation

## 2022-12-12 DIAGNOSIS — M25511 Pain in right shoulder: Secondary | ICD-10-CM | POA: Diagnosis present

## 2022-12-12 DIAGNOSIS — G8929 Other chronic pain: Secondary | ICD-10-CM | POA: Diagnosis present

## 2022-12-12 DIAGNOSIS — G894 Chronic pain syndrome: Secondary | ICD-10-CM

## 2022-12-12 DIAGNOSIS — Z5181 Encounter for therapeutic drug level monitoring: Secondary | ICD-10-CM | POA: Diagnosis present

## 2022-12-12 DIAGNOSIS — M25562 Pain in left knee: Secondary | ICD-10-CM | POA: Insufficient documentation

## 2022-12-12 DIAGNOSIS — Z79891 Long term (current) use of opiate analgesic: Secondary | ICD-10-CM | POA: Diagnosis present

## 2022-12-12 DIAGNOSIS — M47817 Spondylosis without myelopathy or radiculopathy, lumbosacral region: Secondary | ICD-10-CM

## 2022-12-12 MED ORDER — HYDROCODONE-ACETAMINOPHEN 10-325 MG PO TABS: 1.0000 | ORAL_TABLET | Freq: Two times a day (BID) | ORAL | 0 refills | Status: DC | PRN

## 2022-12-16 LAB — TOXASSURE SELECT,+ANTIDEPR,UR

## 2023-01-25 ENCOUNTER — Telehealth: Payer: Self-pay | Admitting: *Deleted

## 2023-01-25 NOTE — Telephone Encounter (Signed)
Urine drug screen for this encounter is consistent for prescribed medication 

## 2023-01-28 ENCOUNTER — Encounter: Payer: Self-pay | Admitting: Registered Nurse

## 2023-01-28 ENCOUNTER — Encounter: Payer: 59 | Attending: Physical Medicine & Rehabilitation | Admitting: Registered Nurse

## 2023-01-28 VITALS — BP 104/68 | HR 88 | Ht <= 58 in | Wt 135.0 lb

## 2023-01-28 DIAGNOSIS — M25552 Pain in left hip: Secondary | ICD-10-CM

## 2023-01-28 DIAGNOSIS — G8929 Other chronic pain: Secondary | ICD-10-CM | POA: Insufficient documentation

## 2023-01-28 DIAGNOSIS — M25511 Pain in right shoulder: Secondary | ICD-10-CM | POA: Diagnosis not present

## 2023-01-28 DIAGNOSIS — G894 Chronic pain syndrome: Secondary | ICD-10-CM

## 2023-01-28 DIAGNOSIS — Z79891 Long term (current) use of opiate analgesic: Secondary | ICD-10-CM

## 2023-01-28 DIAGNOSIS — Z5181 Encounter for therapeutic drug level monitoring: Secondary | ICD-10-CM

## 2023-01-28 DIAGNOSIS — M546 Pain in thoracic spine: Secondary | ICD-10-CM

## 2023-01-28 DIAGNOSIS — M47817 Spondylosis without myelopathy or radiculopathy, lumbosacral region: Secondary | ICD-10-CM | POA: Diagnosis present

## 2023-01-28 MED ORDER — HYDROCODONE-ACETAMINOPHEN 10-325 MG PO TABS: 1.0000 | ORAL_TABLET | Freq: Two times a day (BID) | ORAL | 0 refills | Status: DC | PRN

## 2023-01-28 NOTE — Progress Notes (Signed)
Subjective:    Patient ID: [name] May, female    DOB: June 04, 1941, 82 y.o.   MRN: [id number]  HPI: [name] May is a 82 y.o. female who returns for follow up appointment for chronic pain and medication refill. She states her pain is located in her right shoulder, mid- lower back pain , left hip and left knee pain. She rates her pain 6. Her current exercise regime is walking and performing stretching exercises.  Ms. Elick Morphine equivalent is 20.00 MME.  Lorazepam is also prescribed  by Dr. Casimiro Needle .We have discussed the black box warning of using opioids and benzodiazepines. I highlighted the dangers of using these drugs together and discussed the adverse events including respiratory suppression, overdose, cognitive impairment and importance of compliance with current regimen. We will continue to monitor and adjust as indicated.  she is being closely monitored and under the care of her psychiatrist/  Last UDS was Performed on 12/12/2022, it was consistent.     Pain Inventory Average Pain 7 Pain Right Now 6 My pain is sharp and burning  In the last 24 hours, has pain interfered with the following? General activity 2 Relation with others 3 Enjoyment of life 0 What TIME of day is your pain at its worst? evening and night Sleep (in general) Good  Pain is worse with: bending, unsure, and some activites Pain improves with: rest, heat/ice, pacing activities, and medication Relief from Meds: 7  Family History  Problem Relation Age of Onset   Deep vein thrombosis Neg Hx    Social History   Socioeconomic History   Marital status: Widowed    Spouse name: Not on file   Number of children: Not on file   Years of education: Not on file   Highest education level: Not on file  Occupational History   Not on file  Tobacco Use   Smoking status: Former   Smokeless tobacco: Never   Tobacco comments:    quit 25 years ago   Vaping Use   Vaping Use: Never used  Substance and  Sexual Activity   Alcohol use: No   Drug use: No   Sexual activity: Not Currently  Other Topics Concern   Not on file  Social History Narrative   Not on file   Social Determinants of Health   Financial Resource Strain: Not on file  Food Insecurity: Not on file  Transportation Needs: Not on file  Physical Activity: Not on file  Stress: Not on file  Social Connections: Not on file   Past Surgical History:  Procedure Laterality Date   ABDOMINAL HYSTERECTOMY     [place]  05/16/2017   Procedure: STENT OF BILATERAL COMMON FEMORAL VEIN WITH 16 X 90 WALL STENT;  Surgeon: Waynetta Sandy, MD;  Location: [place];  Service: Vascular;;   hemroidectomy     INSERTION OF ILIAC STENT Bilateral 05/16/2017   Procedure: STENT OF BILATERAL COMMON AND EXTERNAL ILIAC VEIN WITH 18 X 90 WALL STENT;  Surgeon: Waynetta Sandy, MD;  Location: [place];  Service: Vascular;  Laterality: Bilateral;   INTRAVASCULAR ULTRASOUND/IVUS N/A 05/06/2017   Procedure: INTRAVASCULAR ULTRASOUND/IVUS;  Surgeon: Waynetta Sandy, MD;  Location: [place];  Service: Vascular;  Laterality: N/A;   IVC FILTER INSERTION N/A 05/16/2017   Procedure: MECHANICAL THROMBECTOMY OF IVC FILTER;  Surgeon: Waynetta Sandy,  MD;  [facility]: [facility];  Service: Vascular;  Laterality: N/A;   LAMINECTOMY     LOWER EXTREMITY VENOGRAPHY N/A 05/14/2017   Procedure: Lower Extremity Venography;  Surgeon: Serafina Mitchell, MD;  Location: [place] [facility];  Service: Cardiovascular;  Laterality: N/A;   NASAL SINUS SURGERY     PERCUTANEOUS VENOUS THROMBECTOMY,LYSIS WITH INTRAVASCULAR ULTRASOUND (IVUS) N/A 05/16/2017   Procedure: PERCUTANEOUS VENOUS THROMBECTOMY AND LYSIS WITH INTRAVASCULAR ULTRASOUND (IVUS);  Surgeon: Waynetta Sandy, MD;  Location: [place];  Service: Vascular;  Laterality: N/A;   VENA CAVA FILTER PLACEMENT N/A 05/16/2017    Procedure: RETRIEVAL INFERIOR VENA-CAVA FILTER;  Surgeon: Waynetta Sandy, MD;  Location: [place];  Service: Vascular;  Laterality: N/A;   VENOGRAM Bilateral 05/16/2017   Procedure: VENOGRAM OF IVC;  Surgeon: Waynetta Sandy, MD;  [facility];  Service: Vascular;  Laterality: Bilateral;   Past Surgical History:  Procedure Laterality Date   ABDOMINAL HYSTERECTOMY     ANKLE SURGERY     COLONOSCOPY     EYE SURGERY     caratacs   FEMORAL ARTERY EXPLORATION  05/16/2017   Procedure: STENT OF BILATERAL COMMON FEMORAL VEIN WITH 16 X 90 WALL STENT;  Surgeon: Waynetta Sandy, MD;  Location: [place];  Service: Vascular;;   hemroidectomy     INSERTION OF ILIAC STENT Bilateral 05/16/2017   Procedure: STENT OF BILATERAL COMMON AND EXTERNAL ILIAC VEIN WITH 18 X 90 WALL STENT;  Surgeon: Waynetta Sandy, MD;  Location: [place];  Service: Vascular;  Laterality: Bilateral;   INTRAVASCULAR ULTRASOUND/IVUS N/A 05/06/2017   Procedure: INTRAVASCULAR ULTRASOUND/IVUS;  Surgeon: Waynetta Sandy, MD;  Location: [place];  Service: Vascular;  Laterality: N/A;   IVC FILTER INSERTION N/A 05/16/2017   Procedure: MECHANICAL THROMBECTOMY OF IVC FILTER;  Surgeon: Waynetta Sandy, MD;  Location: [place];  Service: Vascular;  Laterality: N/A;   LAMINECTOMY     LOWER EXTREMITY VENOGRAPHY N/A 05/14/2017   Procedure: Lower Extremity Venography;  Surgeon: Serafina Mitchell, MD;  Location: [place] [facility];  Service: Cardiovascular;  Laterality: N/A;   NASAL SINUS SURGERY     PERCUTANEOUS VENOUS THROMBECTOMY,LYSIS WITH INTRAVASCULAR ULTRASOUND (IVUS) N/A 05/16/2017   Procedure: PERCUTANEOUS VENOUS THROMBECTOMY AND LYSIS WITH INTRAVASCULAR ULTRASOUND (IVUS);  Surgeon: Waynetta Sandy, MD;  Location: [place];  Service: Vascular;  Laterality: N/A;   VENA CAVA FILTER PLACEMENT N/A 05/16/2017   Procedure: RETRIEVAL INFERIOR VENA-CAVA FILTER;  Surgeon: Waynetta Sandy, MD;   Location: [place];  Service: Vascular;  Laterality: N/A;   VENOGRAM Bilateral 05/16/2017   Procedure: VENOGRAM OF IVC;  Surgeon: Waynetta Sandy, MD;  Location: [place];  Service: Vascular;  Laterality: Bilateral;   Past Medical History:  Diagnosis Date   Allergy    Anemia    Arthritis    left knee   Back pain    Bipolar disorder (HCC)    Chronic headaches    COPD (chronic obstructive pulmonary disease) ([place])    Degenerative disorder of bone    Depression    Esophageal ulcer    history   GERD (gastroesophageal reflux disease)    Hyperlipidemia    Myeloma ([place])    Peripheral vascular disease ([place])    Pulmonary embolism ([place])    Scoliosis    There were no vitals taken for this visit.  Opioid Risk Score:   Fall Risk Score:  `1  Depression screen [facility] 2/9     12/12/2022  1:42 PM 11/01/2022    1:37 PM 08/06/2022    1:20 PM 07/12/2022   10:31 AM 06/14/2022   11:43 AM 02/23/2022   12:32 PM 12/26/2021    1:28 PM  Depression screen PHQ 2/9  Decreased Interest 0 1 1 1  0 0 0  Down, Depressed, Hopeless 0 1 1 1  0 0 0  PHQ - 2 Score 0 2 2 2  0 0 0     Review of Systems  Musculoskeletal:        RT shoulder B/l hip  All other systems reviewed and are negative.      Objective:   Physical Exam Vitals and nursing note reviewed.  Constitutional:      Appearance: Normal appearance.  Cardiovascular:     Rate and Rhythm: Normal rate and regular rhythm.     Pulses: Normal pulses.     Heart sounds: Normal heart sounds.  Pulmonary:     Effort: Pulmonary effort is normal.     Breath sounds: Normal breath sounds.  Musculoskeletal:     Cervical back: Normal range of motion and neck supple.     Comments: Normal Muscle Bulk and Muscle Testing Reveals:  Upper Extremities: Right Upper Extremity: Decreased ROM 20 degrees and Muscle Strength 4/5 Right AC Joint Tenderness Left Upper Extremity: Decreased  ROM  90 Degrees and Muscle Strength 5/5 Thoracic Paraspinal Tenderness: T-10-  T-12 Lumbar Paraspinal Tenderness: L-3-L-5 Lower Extremities: Right: Full ROM and Muscle Strength 5/5 Right Lower Extremity Flexion Produces Pain into her Right Patella Left Lower Extremity: Decreased ROM and Muscle Strength 5/5 Left Lower Extremity Flexion Produces Pain into her Patella Arises from Table slowly using cane for support Antalgic  Gait     Skin:    General: Skin is warm and dry.  Neurological:     Mental Status: She is alert and oriented to person, place, and time.  Psychiatric:        Mood and Affect: Mood normal.        Behavior: Behavior normal.         Assessment & Plan:  1.  History of osteoporosis with multiple thoracic and lumbar compression fractures as well as bilateral rib fractures, patient is no longer symptomatic at these levels. We will continue to Monitor. 01/28/2023  2.  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 Continue to Monitor. 01/28/2023  3. Bilateral Knee Pain/ Left Knee knee pain: S/P Fall : Hospitalized  at [facility] on 05/25/2022 For: 1.  Acute nondisplaced left patellar fracture with associated hemarthrosis: 2/2 to fall. Orthopedics recommended left knee immobilizer, WBAT, Has Therapy weekly.  Orthopedics following. No falls this month. Continue to monitor. Continue HEP as Tolerated. Continue to Monitor. 01/28/2023 4.  Chronic pain syndrome multifactorial continue hydrocodone 10 mg /325 1 tablet twice a day as needed for pain #60.Marland Kitchen We will continue the opioid monitoring program, this consists of regular clinic visits, examinations, urine drug screen, pill counts as well as use of [place] Controlled Substance Reporting system. A 12 month History has been reviewed on the [place] Controlled Substance Reporting System on 01/28/2023   5. Lumbosacral Spondylosis without Myelopathy: Continue HEP as Tolerated. Continue current medication regimen. Continue to  Monitor. 01/28/2023 6. Chronic Right Shoulder Pain: Continue HEP as Tolerated. Continue to monitor. 01/28/2023 7. Chronic Left Hip Pain: Continue HEP as tolerated. Continue current medication regimen. Continue to monitor. 01/28/2023   F/U  in 2 months

## 2023-03-05 ENCOUNTER — Encounter: Payer: 59 | Attending: Physical Medicine & Rehabilitation | Admitting: Registered Nurse

## 2023-03-05 ENCOUNTER — Encounter: Payer: Self-pay | Admitting: Registered Nurse

## 2023-03-05 VITALS — BP 108/72 | HR 90 | Ht <= 58 in | Wt 135.0 lb

## 2023-03-05 DIAGNOSIS — Z5181 Encounter for therapeutic drug level monitoring: Secondary | ICD-10-CM | POA: Insufficient documentation

## 2023-03-05 DIAGNOSIS — M25561 Pain in right knee: Secondary | ICD-10-CM

## 2023-03-05 DIAGNOSIS — M47817 Spondylosis without myelopathy or radiculopathy, lumbosacral region: Secondary | ICD-10-CM | POA: Diagnosis present

## 2023-03-05 DIAGNOSIS — Z79891 Long term (current) use of opiate analgesic: Secondary | ICD-10-CM | POA: Diagnosis present

## 2023-03-05 DIAGNOSIS — M25552 Pain in left hip: Secondary | ICD-10-CM | POA: Diagnosis present

## 2023-03-05 DIAGNOSIS — G894 Chronic pain syndrome: Secondary | ICD-10-CM | POA: Insufficient documentation

## 2023-03-05 DIAGNOSIS — G8929 Other chronic pain: Secondary | ICD-10-CM | POA: Insufficient documentation

## 2023-03-05 DIAGNOSIS — M25562 Pain in left knee: Secondary | ICD-10-CM | POA: Diagnosis present

## 2023-03-05 DIAGNOSIS — M25511 Pain in right shoulder: Secondary | ICD-10-CM | POA: Insufficient documentation

## 2023-03-05 NOTE — Progress Notes (Signed)
Subjective:    Patient ID: [name] May, female    DOB: October 05, 1941, 82 y.o.   MRN: [id number]  HPI: [name] May is a 82 y.o. female who returns for follow up appointment for chronic pain and medication refill. She states her  pain is located in her right shoulder, lower back, left hip and left knee pain.She rates her pain 7. Her current exercise regime is walking with her walker and she will begin physical therapy tomorrow.   [name] May was admitted to [facility] on 02/27/2023 for CAP, note was reviewed. She was discharged on 03/02/2023.   [name] May Morphine equivalent is 20.00 MME. She  is also prescribed Lorazepam by Dr. Donell Beers .We have discussed the black box warning of using opioids and benzodiazepines. I highlighted the dangers of using these drugs together and discussed the adverse events including respiratory suppression, overdose, cognitive impairment and importance of compliance with current regimen. We will continue to monitor and adjust as indicated.  she is being closely monitored and under the care of her psychiatrist Dr Donell Beers.  Last UDS was Performed on 12/12/2022, it was consistent.    Pain Inventory Average Pain 6 Pain Right Now 7 My pain is intermittent  In the last 24 hours, has pain interfered with the following? General activity 0 Relation with others 0 Enjoyment of life 0 What TIME of day is your pain at its worst? varies Sleep (in general) Good  Pain is worse with: unsure Pain improves with: rest, heat/ice, and medication Relief from Meds: 8  Family History  Problem Relation Age of Onset   Deep vein thrombosis Neg Hx    Social History   Socioeconomic History   Marital status: Widowed    Spouse name: Not on file   Number of children: Not on file   Years of education: Not on file   Highest education level: Not on file  Occupational History   Not on file  Tobacco Use   Smoking status: Former   Smokeless tobacco: Never   Tobacco comments:     quit 25 years ago   Vaping Use   Vaping Use: Never used  Substance and Sexual Activity   Alcohol use: No   Drug use: No   Sexual activity: Not Currently  Other Topics Concern   Not on file  Social History Narrative   Not on file   Social Determinants of Health   Financial Resource Strain: Not on file  Food Insecurity: Not on file  Transportation Needs: Not on file  Physical Activity: Not on file  Stress: Not on file  Social Connections: Not on file   Past Surgical History:  Procedure Laterality Date   ABDOMINAL HYSTERECTOMY     ANKLE SURGERY     COLONOSCOPY     CORONARY ULTRASOUND/IVUS N/A 05/06/2017   Procedure: INTRAVASCULAR ULTRASOUND/IVUS;  Surgeon: Maeola Harman, MD;  Location: [facility] OR;  Service: Vascular;  Laterality: N/A;   EYE SURGERY     caratacs   FEMORAL ARTERY EXPLORATION  05/16/2017   Procedure: STENT OF BILATERAL COMMON FEMORAL VEIN WITH 16 X 90 WALL STENT;  Surgeon: Maeola Harman, MD;  Location: [facility] OR;  Service: Vascular;;   hemroidectomy     INSERTION OF ILIAC STENT Bilateral 05/16/2017   Procedure: STENT OF BILATERAL COMMON AND EXTERNAL ILIAC VEIN WITH 18 X 90 WALL STENT;  Surgeon: Maeola Harman, MD;  Location: [facility] OR;  Service: Vascular;  Laterality: Bilateral;   IVC FILTER INSERTION  N/A 05/16/2017   Procedure: MECHANICAL THROMBECTOMY OF IVC FILTER;  Surgeon: Maeola Harman, MD;  Location: [facility] OR;  Service: Vascular;  Laterality: N/A;   LAMINECTOMY     LOWER EXTREMITY VENOGRAPHY N/A 05/14/2017   Procedure: Lower Extremity Venography;  Surgeon: Nada Libman, MD;  Location: [facility];  Service: Cardiovascular;  Laterality: N/A;   NASAL SINUS SURGERY     PERCUTANEOUS VENOUS THROMBECTOMY,LYSIS WITH INTRAVASCULAR ULTRASOUND (IVUS) N/A 05/16/2017   Procedure: PERCUTANEOUS VENOUS THROMBECTOMY AND LYSIS WITH INTRAVASCULAR ULTRASOUND (IVUS);  Surgeon: Maeola Harman, MD;  Location: [facility] OR;  Service:  Vascular;  Laterality: N/A;   VENA CAVA FILTER PLACEMENT N/A 05/16/2017   Procedure: RETRIEVAL INFERIOR VENA-CAVA FILTER;  Surgeon: Maeola Harman, MD;  Location: [facility] OR;  Service: Vascular;  Laterality: N/A;   VENOGRAM Bilateral 05/16/2017   Procedure: VENOGRAM OF IVC;  Surgeon: Maeola Harman, MD;  Location: [facility] OR;  Service: Vascular;  Laterality: Bilateral;   Past Surgical History:  Procedure Laterality Date   ABDOMINAL HYSTERECTOMY     ANKLE SURGERY     COLONOSCOPY     CORONARY ULTRASOUND/IVUS N/A 05/06/2017   Procedure: INTRAVASCULAR ULTRASOUND/IVUS;  Surgeon: Maeola Harman, MD;  Location: [facility] OR;  Service: Vascular;  Laterality: N/A;   EYE SURGERY     caratacs   FEMORAL ARTERY EXPLORATION  05/16/2017   Procedure: STENT OF BILATERAL COMMON FEMORAL VEIN WITH 16 X 90 WALL STENT;  Surgeon: Maeola Harman, MD;  Location: [facility] OR;  Service: Vascular;;   hemroidectomy     INSERTION OF ILIAC STENT Bilateral 05/16/2017   Procedure: STENT OF BILATERAL COMMON AND EXTERNAL ILIAC VEIN WITH 18 X 90 WALL STENT;  Surgeon: Maeola Harman, MD;  Location: [facility] OR;  Service: Vascular;  Laterality: Bilateral;   IVC FILTER INSERTION N/A 05/16/2017   Procedure: MECHANICAL THROMBECTOMY OF IVC FILTER;  Surgeon: Maeola Harman, MD;  Location: [facility] OR;  Service: Vascular;  Laterality: N/A;   LAMINECTOMY     LOWER EXTREMITY VENOGRAPHY N/A 05/14/2017   Procedure: Lower Extremity Venography;  Surgeon: Nada Libman, MD;  Location: [facility];  Service: Cardiovascular;  Laterality: N/A;   NASAL SINUS SURGERY     PERCUTANEOUS VENOUS THROMBECTOMY,LYSIS WITH INTRAVASCULAR ULTRASOUND (IVUS) N/A 05/16/2017   Procedure: PERCUTANEOUS VENOUS THROMBECTOMY AND LYSIS WITH INTRAVASCULAR ULTRASOUND (IVUS);  Surgeon: Maeola Harman, MD;  Location: [facility] OR;  Service: Vascular;  Laterality: N/A;   VENA CAVA FILTER PLACEMENT N/A 05/16/2017   Procedure: RETRIEVAL  INFERIOR VENA-CAVA FILTER;  Surgeon: Maeola Harman, MD;  Location: [facility] OR;  Service: Vascular;  Laterality: N/A;   VENOGRAM Bilateral 05/16/2017   Procedure: VENOGRAM OF IVC;  Surgeon: Maeola Harman, MD;  Location: [facility] OR;  Service: Vascular;  Laterality: Bilateral;   Past Medical History:  Diagnosis Date   Allergy    Anemia    Arthritis    left knee   Back pain    Bipolar disorder    Chronic headaches    COPD (chronic obstructive pulmonary disease)    Degenerative disorder of bone    Depression    Esophageal ulcer    history   GERD (gastroesophageal reflux disease)    Hyperlipidemia    Myeloma    Peripheral vascular disease    Pulmonary embolism    Scoliosis    Ht 4\' 9"  (1.448 m)   Wt 135 lb (61.2 kg)   BMI 29.21 kg/m   Opioid  Risk Score:   Fall Risk Score:  `1  Depression screen PHQ 2/9     01/28/2023    1:45 PM 12/12/2022    1:42 PM 11/01/2022    1:37 PM 08/06/2022    1:20 PM 07/12/2022   10:31 AM 06/14/2022   11:43 AM 02/23/2022   12:32 PM  Depression screen PHQ 2/9  Decreased Interest 1 0 1 1 1  0 0  Down, Depressed, Hopeless 1 0 1 1 1  0 0  PHQ - 2 Score 2 0 2 2 2  0 0      Review of Systems  Musculoskeletal:  Positive for back pain and gait problem.  All other systems reviewed and are negative.     Objective:   Physical Exam Vitals and nursing note reviewed.  Constitutional:      Appearance: Normal appearance.  Cardiovascular:     Rate and Rhythm: Normal rate and regular rhythm.     Pulses: Normal pulses.     Heart sounds: Normal heart sounds.  Pulmonary:     Effort: Pulmonary effort is normal.     Breath sounds: Normal breath sounds.  Musculoskeletal:     Cervical back: Normal range of motion and neck supple.     Comments: Normal Muscle Bulk and Muscle Testing Reveals:  Upper Extremities: Right Upper Extremity: Decreased ROM 20 Degrees and Muscle Strength 5/5 Left Upper Extremity: Full ROM and Muscle Strength 5/5 Lumbar  Paraspinal Tenderness: L-3-L-5 Left Greater Trochanter Tenderness Lower Extremities: Right: Full ROM and Muscle Strength 5/5 Left Lower Extremity: Full ROM and Muscle Strength 5/5 Left Lower Extremity Flexion Produces Pain into her Left Patella Arises from Table slowly using walker for support Narrow Based  Gait     Skin:    General: Skin is warm and dry.  Neurological:     Mental Status: She is alert and oriented to person, place, and time.  Psychiatric:        Mood and Affect: Mood normal.        Behavior: Behavior normal.         Assessment & Plan:  1.  History of osteoporosis with multiple thoracic and lumbar compression fractures as well as bilateral rib fractures, patient is no longer symptomatic at these levels. We will continue to Monitor. 03/05/2023  2.  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 Continue to Monitor. 03/05/2023  3. Bilateral Knee Pain/ Left Knee knee pain: S/P Fall : Hospitalized  at [facility] on 05/25/2022 For: 1.  Acute nondisplaced left patellar fracture with associated hemarthrosis: 2/2 to fall. Orthopedics recommended left knee immobilizer, WBAT, Has Therapy weekly.  Orthopedics following. No falls this month. Continue to monitor. Continue HEP as Tolerated. Continue to Monitor. 03/05/2023 4.  Chronic pain syndrome multifactorial continue hydrocodone 10 mg /325 1 tablet twice a day as needed for pain #60.Marland Kitchen We will continue the opioid monitoring program, this consists of regular clinic visits, examinations, urine drug screen, pill counts as well as use of [place] Controlled Substance Reporting system. A 12 month History has been reviewed on the [place] Controlled Substance Reporting System on 03/05/2023   5. Lumbosacral Spondylosis without Myelopathy: Continue HEP as Tolerated. Continue current medication regimen. Continue to Monitor. 03/05/2023 6. Chronic Right  Shoulder Pain: Continue HEP as Tolerated. Continue to monitor. 03/05/2023 7. Chronic Left Hip Pain: Continue HEP as tolerated. Continue current medication regimen. Continue to monitor. 03/05/2023  F/U in 2 months

## 2023-04-15 ENCOUNTER — Other Ambulatory Visit: Payer: Self-pay | Admitting: Family Medicine

## 2023-04-15 DIAGNOSIS — E2839 Other primary ovarian failure: Secondary | ICD-10-CM

## 2023-04-29 ENCOUNTER — Ambulatory Visit
Admission: RE | Admit: 2023-04-29 | Discharge: 2023-04-29 | Disposition: A | Discharge status: Home / Self Care | Payer: 59 | Source: Ambulatory Visit | Attending: Family Medicine | Admitting: Family Medicine

## 2023-04-29 DIAGNOSIS — E2839 Other primary ovarian failure: Secondary | ICD-10-CM

## 2023-05-13 ENCOUNTER — Encounter: Payer: 59 | Admitting: Registered Nurse

## 2023-05-14 ENCOUNTER — Encounter: Payer: 59 | Attending: Physical Medicine & Rehabilitation | Admitting: Registered Nurse

## 2023-05-14 ENCOUNTER — Encounter: Payer: Self-pay | Admitting: Registered Nurse

## 2023-05-14 VITALS — BP 119/73 | HR 75 | Ht <= 58 in | Wt 136.4 lb

## 2023-05-14 DIAGNOSIS — Z5181 Encounter for therapeutic drug level monitoring: Secondary | ICD-10-CM | POA: Insufficient documentation

## 2023-05-14 DIAGNOSIS — M25512 Pain in left shoulder: Secondary | ICD-10-CM | POA: Insufficient documentation

## 2023-05-14 DIAGNOSIS — M25562 Pain in left knee: Secondary | ICD-10-CM | POA: Insufficient documentation

## 2023-05-14 DIAGNOSIS — M25561 Pain in right knee: Secondary | ICD-10-CM | POA: Diagnosis present

## 2023-05-14 DIAGNOSIS — Z79891 Long term (current) use of opiate analgesic: Secondary | ICD-10-CM | POA: Diagnosis present

## 2023-05-14 DIAGNOSIS — M47817 Spondylosis without myelopathy or radiculopathy, lumbosacral region: Secondary | ICD-10-CM | POA: Diagnosis present

## 2023-05-14 DIAGNOSIS — M25511 Pain in right shoulder: Secondary | ICD-10-CM | POA: Insufficient documentation

## 2023-05-14 DIAGNOSIS — G8929 Other chronic pain: Secondary | ICD-10-CM | POA: Diagnosis present

## 2023-05-14 DIAGNOSIS — G894 Chronic pain syndrome: Secondary | ICD-10-CM | POA: Diagnosis present

## 2023-05-14 MED ORDER — HYDROCODONE-ACETAMINOPHEN 10-325 MG PO TABS: 1.0000 | ORAL_TABLET | Freq: Two times a day (BID) | ORAL | 0 refills | Status: DC | PRN

## 2023-05-14 NOTE — Progress Notes (Signed)
Subjective:    Patient ID: [name], female    DOB: Sep 07, 1941, 82 y.o.   MRN: [id number]  HPI: [name] May is a 82 y.o. female who returns for follow up appointment for chronic pain and medication refill. She states her pain is located in her  bilateral shoulders R>L, lower back pain and bilateral knee pain L>R.. Ms. Macha also reports at times her hydrocodone not controlling her pain, she will keep a pain journal and will call or send a My-Chart message in a week, she and daughter verbalizes understanding. She  rates her pain 6. Her current exercise regime is walking short distances with a cane.   Ms. Vanhook Morphine equivalent is 20.00 MME.   UDS ordered today.     Pain Inventory Average Pain 7 Pain Right Now 6 My pain is sharp and burning  In the last 24 hours, has pain interfered with the following? General activity 6 Relation with others 0 Enjoyment of life 0 What TIME of day is your pain at its worst? evening and night Sleep (in general) NA  Pain is worse with: some activites Pain improves with: heat/ice, therapy/exercise, and medication Relief from Meds: 8  Family History  Problem Relation Age of Onset   Deep vein thrombosis Neg Hx    Social History   Socioeconomic History   Marital status: Widowed    Spouse name: Not on file   Number of children: Not on file   Years of education: Not on file   Highest education level: Not on file  Occupational History   Not on file  Tobacco Use   Smoking status: Former   Smokeless tobacco: Never   Tobacco comments:    quit 25 years ago   Vaping Use   Vaping Use: Never used  Substance and Sexual Activity   Alcohol use: No   Drug use: No   Sexual activity: Not Currently  Other Topics Concern   Not on file  Social History Narrative   Not on file   Social Determinants of Health   Financial Resource Strain: Not on file  Food Insecurity: Not on file  Transportation Needs: Not on file  Physical  Activity: Not on file  Stress: Not on file  Social Connections: Not on file   Past Surgical History:  Procedure Laterality Date   ABDOMINAL HYSTERECTOMY     ANKLE SURGERY     COLONOSCOPY     CORONARY ULTRASOUND/IVUS N/A 05/06/2017   Procedure: INTRAVASCULAR ULTRASOUND/IVUS;  Surgeon: Maeola Harman, MD;  Location: [facility] OR;  Service: Vascular;  Laterality: N/A;   EYE SURGERY     caratacs   FEMORAL ARTERY EXPLORATION  05/16/2017   Procedure: STENT OF BILATERAL COMMON FEMORAL VEIN WITH 16 X 90 WALL STENT;  Surgeon: Maeola Harman, MD;  Location: [facility] OR;  Service: Vascular;;   hemroidectomy     INSERTION OF ILIAC STENT Bilateral 05/16/2017   Procedure: STENT OF BILATERAL COMMON AND EXTERNAL ILIAC VEIN WITH 18 X 90 WALL STENT;  Surgeon: Maeola Harman, MD;  Location: [facility] OR;  Service: Vascular;  Laterality: Bilateral;   IVC FILTER INSERTION N/A 05/16/2017   Procedure: MECHANICAL THROMBECTOMY OF IVC FILTER;  Surgeon: Maeola Harman, MD;  Location: [facility] OR;  Service: Vascular;  Laterality: N/A;   LAMINECTOMY     LOWER EXTREMITY VENOGRAPHY N/A 05/14/2017   Procedure: Lower Extremity Venography;  Surgeon: Nada Libman, MD;  Location: [facility];  Service: Cardiovascular;  Laterality: N/A;   NASAL SINUS SURGERY     PERCUTANEOUS VENOUS THROMBECTOMY,LYSIS WITH INTRAVASCULAR ULTRASOUND (IVUS) N/A 05/16/2017   Procedure: PERCUTANEOUS VENOUS THROMBECTOMY AND LYSIS WITH INTRAVASCULAR ULTRASOUND (IVUS);  Surgeon: Maeola Harman, MD;  Location: [facility] OR;  Service: Vascular;  Laterality: N/A;   VENA CAVA FILTER PLACEMENT N/A 05/16/2017   Procedure: RETRIEVAL INFERIOR VENA-CAVA FILTER;  Surgeon: Maeola Harman, MD;  Location: [facility] OR;  Service: Vascular;  Laterality: N/A;   VENOGRAM Bilateral 05/16/2017   Procedure: VENOGRAM OF IVC;  Surgeon: Maeola Harman, MD;  Location: [facility] OR;  Service: Vascular;  Laterality: Bilateral;   Past Surgical  History:  Procedure Laterality Date   ABDOMINAL HYSTERECTOMY     ANKLE SURGERY     COLONOSCOPY     CORONARY ULTRASOUND/IVUS N/A 05/06/2017   Procedure: INTRAVASCULAR ULTRASOUND/IVUS;  Surgeon: Maeola Harman, MD;  Location: [facility] OR;  Service: Vascular;  Laterality: N/A;   EYE SURGERY     caratacs   FEMORAL ARTERY EXPLORATION  05/16/2017   Procedure: STENT OF BILATERAL COMMON FEMORAL VEIN WITH 16 X 90 WALL STENT;  Surgeon: Maeola Harman, MD;  Location: [facility] OR;  Service: Vascular;;   hemroidectomy     INSERTION OF ILIAC STENT Bilateral 05/16/2017   Procedure: STENT OF BILATERAL COMMON AND EXTERNAL ILIAC VEIN WITH 18 X 90 WALL STENT;  Surgeon: Maeola Harman, MD;  Location: [facility] OR;  Service: Vascular;  Laterality: Bilateral;   IVC FILTER INSERTION N/A 05/16/2017   Procedure: MECHANICAL THROMBECTOMY OF IVC FILTER;  Surgeon: Maeola Harman, MD;  Location: [facility] OR;  Service: Vascular;  Laterality: N/A;   LAMINECTOMY     LOWER EXTREMITY VENOGRAPHY N/A 05/14/2017   Procedure: Lower Extremity Venography;  Surgeon: Nada Libman, MD;  Location: [facility];  Service: Cardiovascular;  Laterality: N/A;   NASAL SINUS SURGERY     PERCUTANEOUS VENOUS THROMBECTOMY,LYSIS WITH INTRAVASCULAR ULTRASOUND (IVUS) N/A 05/16/2017   Procedure: PERCUTANEOUS VENOUS THROMBECTOMY AND LYSIS WITH INTRAVASCULAR ULTRASOUND (IVUS);  Surgeon: Maeola Harman, MD;  Location: [facility] OR;  Service: Vascular;  Laterality: N/A;   VENA CAVA FILTER PLACEMENT N/A 05/16/2017   Procedure: RETRIEVAL INFERIOR VENA-CAVA FILTER;  Surgeon: Maeola Harman, MD;  Location: [facility] OR;  Service: Vascular;  Laterality: N/A;   VENOGRAM Bilateral 05/16/2017   Procedure: VENOGRAM OF IVC;  Surgeon: Maeola Harman, MD;  Location: [facility] OR;  Service: Vascular;  Laterality: Bilateral;   Past Medical History:  Diagnosis Date   Allergy    Anemia    Arthritis    left knee   Back pain    Bipolar  disorder (HCC)    Chronic headaches    COPD (chronic obstructive pulmonary disease) (HCC)    Degenerative disorder of bone    Depression    Esophageal ulcer    history   GERD (gastroesophageal reflux disease)    Hyperlipidemia    Myeloma (HCC)    Peripheral vascular disease (HCC)    Pulmonary embolism (HCC)    Scoliosis    BP 119/73   Pulse 75   Ht 4\' 9"  (1.448 m)   Wt 136 lb 6.4 oz (61.9 kg)   SpO2 94%   BMI 29.52 kg/m   Opioid Risk Score:   Fall Risk Score:  `1  Depression screen [facility] 2/9     05/14/2023    9:54 AM 01/28/2023    1:45 PM 12/12/2022    1:42 PM 11/01/2022    1:37  PM 08/06/2022    1:20 PM 07/12/2022   10:31 AM 06/14/2022   11:43 AM  Depression screen PHQ 2/9  Decreased Interest 1 1 0 1 1 1  0  Down, Depressed, Hopeless 1 1 0 1 1 1  0  PHQ - 2 Score 2 2 0 2 2 2  0     Review of Systems  Constitutional: Negative.   HENT: Negative.    Eyes: Negative.   Respiratory: Negative.    Cardiovascular: Negative.   Gastrointestinal: Negative.   Endocrine: Negative.   Genitourinary: Negative.   Musculoskeletal:  Positive for arthralgias and back pain.  Skin: Negative.   Allergic/Immunologic: Negative.   Neurological: Negative.   Hematological:  Bruises/bleeds easily.       Apixaban  Psychiatric/Behavioral: Negative.    All other systems reviewed and are negative.      Objective:   Physical Exam Vitals and nursing note reviewed.  Constitutional:      Appearance: Normal appearance.  Cardiovascular:     Rate and Rhythm: Normal rate and regular rhythm.     Pulses: Normal pulses.     Heart sounds: Normal heart sounds.  Pulmonary:     Effort: Pulmonary effort is normal.     Breath sounds: Normal breath sounds.  Musculoskeletal:     Cervical back: Normal range of motion and neck supple.     Comments: Normal Muscle Bulk and Muscle Testing Reveals:  Upper Extremities: Right: Decreased ROM  20 Degrees and Muscle Strength 3/5 Left Upper Extremity: Full ROM and  Muscle Strength 5/5  Lumbar Paraspinal Tenderness: L-3-L-5 Lower Extremities: Right: Full ROM and Muscle Strength 5/5 Left Lower Extremity: Decreased ROM and Muscle Strength 5/5 Left Lower Extremity Flexion Produces Pain into her Left Patella Arises from Chair slowly using cane for support Antalgic  Gait     Skin:    General: Skin is warm and dry.  Neurological:     Mental Status: She is alert and oriented to person, place, and time.  Psychiatric:        Mood and Affect: Mood normal.        Behavior: Behavior normal.         Assessment & Plan:  1.  History of osteoporosis with multiple thoracic and lumbar compression fractures as well as bilateral rib fractures, patient is no longer symptomatic at these levels. We will continue to Monitor. 05/14/2023  2.  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 Continue to Monitor. 05/14/2023  3. Bilateral Knee Pain/ Left Knee knee pain: S/P Fall : Hospitalized  at [facility] on 05/25/2022 For: 1.  Acute nondisplaced left patellar fracture with associated hemarthrosis: 2/2 to fall. Orthopedics recommended left knee immobilizer, WBAT, Has Therapy weekly.  Orthopedics following. No falls this month. Continue to monitor. Continue HEP as Tolerated. Continue to Monitor. 05/14/2023 4.  Chronic pain syndrome multifactorial continue hydrocodone 10 mg /325 1 tablet twice a day as needed for pain #60.Marland Kitchen We will continue the opioid monitoring program, this consists of regular clinic visits, examinations, urine drug screen, pill counts as well as use of [place] Controlled Substance Reporting system. A 12 month History has been reviewed on the [place] Controlled Substance Reporting System on 05/14/2023   5. Lumbosacral Spondylosis without Myelopathy: Continue HEP as Tolerated. Continue current medication regimen. Continue to Monitor. 05/14/2023 6. Chronic Bilateral  Shoulder  Pain: R>L: Continue HEP as Tolerated. Continue to monitor.  05/14/2023 7. Chronic Left Hip Pain: No Complaints today. Continue HEP as tolerated. Continue current medication regimen. Continue to monitor. 05/14/2023   F/U in 2 months

## 2023-05-14 NOTE — Patient Instructions (Signed)
Keep Pain Log " Call or send a My- Chart next week

## 2023-05-18 LAB — TOXASSURE SELECT,+ANTIDEPR,UR

## 2023-07-12 ENCOUNTER — Encounter: Payer: Self-pay | Admitting: Registered Nurse

## 2023-07-12 ENCOUNTER — Encounter: Payer: 59 | Attending: Physical Medicine & Rehabilitation | Admitting: Registered Nurse

## 2023-07-12 VITALS — BP 116/76 | HR 89 | Ht <= 58 in | Wt 134.0 lb

## 2023-07-12 DIAGNOSIS — G894 Chronic pain syndrome: Secondary | ICD-10-CM

## 2023-07-12 DIAGNOSIS — Z79891 Long term (current) use of opiate analgesic: Secondary | ICD-10-CM | POA: Insufficient documentation

## 2023-07-12 DIAGNOSIS — M25562 Pain in left knee: Secondary | ICD-10-CM | POA: Insufficient documentation

## 2023-07-12 DIAGNOSIS — M47817 Spondylosis without myelopathy or radiculopathy, lumbosacral region: Secondary | ICD-10-CM | POA: Diagnosis not present

## 2023-07-12 DIAGNOSIS — M25511 Pain in right shoulder: Secondary | ICD-10-CM | POA: Diagnosis not present

## 2023-07-12 DIAGNOSIS — M25561 Pain in right knee: Secondary | ICD-10-CM | POA: Diagnosis not present

## 2023-07-12 DIAGNOSIS — G8929 Other chronic pain: Secondary | ICD-10-CM | POA: Diagnosis present

## 2023-07-12 DIAGNOSIS — Z5181 Encounter for therapeutic drug level monitoring: Secondary | ICD-10-CM | POA: Diagnosis present

## 2023-07-12 DIAGNOSIS — G5712 Meralgia paresthetica, left lower limb: Secondary | ICD-10-CM | POA: Diagnosis present

## 2023-07-12 MED ORDER — HYDROCODONE-ACETAMINOPHEN 10-325 MG PO TABS: 1.0000 | ORAL_TABLET | Freq: Three times a day (TID) | ORAL | 0 refills | Status: DC | PRN

## 2023-07-12 NOTE — Progress Notes (Signed)
Subjective:    Patient ID: [name] May, female    DOB: 04-26-1941, 82 y.o.   MRN: [id number]  HPI: [name] May is a 82 y.o. female who returns for follow up appointment for chronic pain and medication refill. She states her pain is located in her right shoulder, lower back, left lower extremity with numbness, and bilateral knee pain L>R. She rates her pain 5. Her current exercise regime is walking and performing stretching exercises.  Ms. Parga Morphine equivalent is 20.00 MME. She is also prescribed Lorazepam by Dr. Donell Beers .We have discussed the black box warning of using opioids and benzodiazepines. I highlighted the dangers of using these drugs together and discussed the adverse events including respiratory suppression, overdose, cognitive impairment and importance of compliance with current regimen. We will continue to monitor and adjust as indicated.  she is being closely monitored and under the care of her psychiatrist.    Last UDS was Performed on 05/14/2023, it was consistent.     Pain Inventory Average Pain 7 Pain Right Now 5 My pain is intermittent, sharp, and burning  In the last 24 hours, has pain interfered with the following? General activity 5 Relation with others 5 Enjoyment of life 5 What TIME of day is your pain at its worst? evening Sleep (in general) Good  Pain is worse with: sitting and inactivity Pain improves with: therapy/exercise and medication Relief from Meds: 4  Family History  Problem Relation Age of Onset   Deep vein thrombosis Neg Hx    Social History   Socioeconomic History   Marital status: Widowed    Spouse name: Not on file   Number of children: Not on file   Years of education: Not on file   Highest education level: Not on file  Occupational History   Not on file  Tobacco Use   Smoking status: Former   Smokeless tobacco: Never   Tobacco comments:    quit 25 years ago   Vaping Use   Vaping status: Never Used  Substance  and Sexual Activity   Alcohol use: No   Drug use: No   Sexual activity: Not Currently  Other Topics Concern   Not on file  Social History Narrative   Not on file   Social Determinants of Health   Financial Resource Strain: Not on file  Food Insecurity: Low Risk  (07/09/2023)   Received from [facility]   Food vital sign    Within the past 12 months, you worried that your food would run out before you got money to buy more: Never true    Within the past 12 months, the food you bought just didn't last and you didn't have money to get more. : Never true  Transportation Needs: No Transportation Needs (07/09/2023)   Received from [facility]    In the past 12 months, has lack of reliable transportation kept you from medical appointments, meetings, work or from getting things needed for daily living? : No  Physical Activity: Not on file  Stress: Not on file  Social Connections: Unknown (05/27/2023)   Received from [facility]   Social Network    Social Network: Not on file   Past Surgical History:  Procedure Laterality Date   ABDOMINAL HYSTERECTOMY     ANKLE SURGERY     COLONOSCOPY     CORONARY ULTRASOUND/IVUS N/A 05/06/2017   Procedure: INTRAVASCULAR ULTRASOUND/IVUS;  Surgeon: Maeola Harman, MD;  Location: [facility] OR;  Service: Vascular;  Laterality: N/A;   EYE SURGERY     caratacs   FEMORAL ARTERY EXPLORATION  05/16/2017   Procedure: STENT OF BILATERAL COMMON FEMORAL VEIN WITH 16 X 90 WALL STENT;  Surgeon: Maeola Harman, MD;  Location: [facility] OR;  Service: Vascular;;   hemroidectomy     INSERTION OF ILIAC STENT Bilateral 05/16/2017   Procedure: STENT OF BILATERAL COMMON AND EXTERNAL ILIAC VEIN WITH 18 X 90 WALL STENT;  Surgeon: Maeola Harman, MD;  Location: [facility] OR;  Service: Vascular;  Laterality: Bilateral;   IVC FILTER INSERTION N/A 05/16/2017   Procedure: MECHANICAL THROMBECTOMY OF IVC FILTER;  Surgeon: Maeola Harman, MD;   Location: [facility] OR;  Service: Vascular;  Laterality: N/A;   LAMINECTOMY     LOWER EXTREMITY VENOGRAPHY N/A 05/14/2017   Procedure: Lower Extremity Venography;  Surgeon: Nada Libman, MD;  Location: [facility] INVASIVE CV LAB;  Service: Cardiovascular;  Laterality: N/A;   NASAL SINUS SURGERY     PERCUTANEOUS VENOUS THROMBECTOMY,LYSIS WITH INTRAVASCULAR ULTRASOUND (IVUS) N/A 05/16/2017   Procedure: PERCUTANEOUS VENOUS THROMBECTOMY AND LYSIS WITH INTRAVASCULAR ULTRASOUND (IVUS);  Surgeon: Maeola Harman, MD;  Location: [facility] OR;  Service: Vascular;  Laterality: N/A;   VENA CAVA FILTER PLACEMENT N/A 05/16/2017   Procedure: RETRIEVAL INFERIOR VENA-CAVA FILTER;  Surgeon: Maeola Harman, MD;  Location: [facility] OR;  Service: Vascular;  Laterality: N/A;   VENOGRAM Bilateral 05/16/2017   Procedure: VENOGRAM OF IVC;  Surgeon: Maeola Harman, MD;  Location: [facility] OR;  Service: Vascular;  Laterality: Bilateral;   Past Surgical History:  Procedure Laterality Date   ABDOMINAL HYSTERECTOMY     ANKLE SURGERY     COLONOSCOPY     CORONARY ULTRASOUND/IVUS N/A 05/06/2017   Procedure: INTRAVASCULAR ULTRASOUND/IVUS;  Surgeon: Maeola Harman, MD;  Location: [facility] OR;  Service: Vascular;  Laterality: N/A;   EYE SURGERY     caratacs   FEMORAL ARTERY EXPLORATION  05/16/2017   Procedure: STENT OF BILATERAL COMMON FEMORAL VEIN WITH 16 X 90 WALL STENT;  Surgeon: Maeola Harman, MD;  Location: [facility] OR;  Service: Vascular;;   hemroidectomy     INSERTION OF ILIAC STENT Bilateral 05/16/2017   Procedure: STENT OF BILATERAL COMMON AND EXTERNAL ILIAC VEIN WITH 18 X 90 WALL STENT;  Surgeon: Maeola Harman, MD;  Location: [facility] OR;  Service: Vascular;  Laterality: Bilateral;   IVC FILTER INSERTION N/A 05/16/2017   Procedure: MECHANICAL THROMBECTOMY OF IVC FILTER;  Surgeon: Maeola Harman, MD;  Location: [facility] OR;  Service: Vascular;  Laterality: N/A;   LAMINECTOMY     LOWER EXTREMITY  VENOGRAPHY N/A 05/14/2017   Procedure: Lower Extremity Venography;  Surgeon: Nada Libman, MD;  Location: [facility] INVASIVE CV LAB;  Service: Cardiovascular;  Laterality: N/A;   NASAL SINUS SURGERY     PERCUTANEOUS VENOUS THROMBECTOMY,LYSIS WITH INTRAVASCULAR ULTRASOUND (IVUS) N/A 05/16/2017   Procedure: PERCUTANEOUS VENOUS THROMBECTOMY AND LYSIS WITH INTRAVASCULAR ULTRASOUND (IVUS);  Surgeon: Maeola Harman, MD;  Location: [facility] OR;  Service: Vascular;  Laterality: N/A;   VENA CAVA FILTER PLACEMENT N/A 05/16/2017   Procedure: RETRIEVAL INFERIOR VENA-CAVA FILTER;  Surgeon: Maeola Harman, MD;  Location: [facility] OR;  Service: Vascular;  Laterality: N/A;   VENOGRAM Bilateral 05/16/2017   Procedure: VENOGRAM OF IVC;  Surgeon: Maeola Harman, MD;  Location: [facility] OR;  Service: Vascular;  Laterality: Bilateral;   Past Medical History:  Diagnosis Date   Allergy    Anemia  Arthritis    left knee   Back pain    Bipolar disorder (HCC)    Chronic headaches    COPD (chronic obstructive pulmonary disease) (HCC)    Degenerative disorder of bone    Depression    Esophageal ulcer    history   GERD (gastroesophageal reflux disease)    Hyperlipidemia    Myeloma (HCC)    Peripheral vascular disease (HCC)    Pulmonary embolism (HCC)    Scoliosis    BP 116/76   Pulse 89   Ht 4\' 9"  (1.448 m)   Wt 134 lb (60.8 kg)   SpO2 90%   BMI 29.00 kg/m   Opioid Risk Score:   Fall Risk Score:  `1  Depression screen PHQ 2/9     07/12/2023    9:05 AM 05/14/2023    9:54 AM 01/28/2023    1:45 PM 12/12/2022    1:42 PM 11/01/2022    1:37 PM 08/06/2022    1:20 PM 07/12/2022   10:31 AM  Depression screen PHQ 2/9  Decreased Interest 1 1 1  0 1 1 1   Down, Depressed, Hopeless 1 1 1  0 1 1 1   PHQ - 2 Score 2 2 2  0 2 2 2      Review of Systems  Constitutional: Negative.   HENT: Negative.    Eyes: Negative.   Respiratory: Negative.    Cardiovascular: Negative.   Gastrointestinal: Negative.    Endocrine: Negative.   Genitourinary: Negative.   Musculoskeletal:  Positive for arthralgias, back pain and gait problem.  Skin: Negative.   Allergic/Immunologic: Negative.   Hematological: Negative.   Psychiatric/Behavioral:  Positive for dysphoric mood.   All other systems reviewed and are negative.      Objective:   Physical Exam Vitals and nursing note reviewed.  Constitutional:      Appearance: Normal appearance.  Cardiovascular:     Rate and Rhythm: Normal rate and regular rhythm.     Pulses: Normal pulses.     Heart sounds: Normal heart sounds.  Pulmonary:     Effort: Pulmonary effort is normal.     Breath sounds: Normal breath sounds.  Musculoskeletal:     Cervical back: Normal range of motion and neck supple.     Comments: Normal Muscle Bulk and Muscle Testing Reveals:  Upper Extremities: Right: Decreased ROM 20 Degrees and Muscle Strength 5/5 Left Upper Extremity: Full ROM and Muscle Strength 5/5 Lumbar Paraspinal Tenderness: L-3-L-5 Lower Extremities: Full ROM and Muscle Strength 5/5 Left Lower Extremity Flexion Produces Pain into her Left Patella Arises from Table slowly using cane for support  Antalgic Gait     Skin:    General: Skin is warm and dry.  Neurological:     Mental Status: She is alert and oriented to person, place, and time.  Psychiatric:        Mood and Affect: Mood normal.        Behavior: Behavior normal.         Assessment & Plan:  1.  History of osteoporosis with multiple thoracic and lumbar compression fractures as well as bilateral rib fractures, patient is no longer symptomatic at these levels. We will continue to Monitor. 07/12/2023  2.  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 Continue to Monitor. 07/12/2023  3. Bilateral Knee Pain/ Left Knee knee pain: S/P Fall : Hospitalized  at [facility] on 05/25/2022 For: 1.  Acute nondisplaced left patellar  fracture with associated hemarthrosis: 2/2 to fall. Orthopedics recommended left knee immobilizer, WBAT, Has Therapy weekly.  Orthopedics following. No falls this month. Continue to monitor. Continue HEP as Tolerated. Continue to Monitor. 07/12/2023 4.  Chronic pain syndrome multifactorial continue hydrocodone 10 mg /325 1 tablet three a day as needed for pain #75.Marland KitchenSecond script sent for the following month. We will continue the opioid monitoring program, this consists of regular clinic visits, examinations, urine drug screen, pill counts as well as use of [place] Controlled Substance Reporting system. A 12 month History has been reviewed on the [place] Controlled Substance Reporting System on 07/12/2023   5. Lumbosacral Spondylosis without Myelopathy: Continue HEP as Tolerated. Continue current medication regimen. Continue to Monitor. 07/12/2023 6. Chronic Bilateral  Shoulder Pain: R>L: Continue HEP as Tolerated. Continue to monitor. 07/12/2023 7. Chronic Left Hip Pain: No Complaints today. Continue HEP as tolerated. Continue current medication regimen. Continue to monitor. 07/12/2023   F/U in 2 months

## 2023-08-27 ENCOUNTER — Emergency Department (HOSPITAL_COMMUNITY): Payer: 59

## 2023-08-27 ENCOUNTER — Encounter (HOSPITAL_COMMUNITY): Payer: Self-pay

## 2023-08-27 ENCOUNTER — Other Ambulatory Visit: Payer: Self-pay

## 2023-08-27 ENCOUNTER — Emergency Department (HOSPITAL_COMMUNITY)
Admission: EM | Admit: 2023-08-27 | Discharge: 2023-08-28 | Disposition: A | Discharge status: Home / Self Care | Payer: 59 | Attending: Emergency Medicine | Admitting: Emergency Medicine

## 2023-08-27 DIAGNOSIS — L03213 Periorbital cellulitis: Secondary | ICD-10-CM | POA: Diagnosis not present

## 2023-08-27 DIAGNOSIS — J449 Chronic obstructive pulmonary disease, unspecified: Secondary | ICD-10-CM | POA: Diagnosis not present

## 2023-08-27 DIAGNOSIS — Z87891 Personal history of nicotine dependence: Secondary | ICD-10-CM | POA: Diagnosis not present

## 2023-08-27 DIAGNOSIS — R4182 Altered mental status, unspecified: Secondary | ICD-10-CM | POA: Diagnosis present

## 2023-08-27 DIAGNOSIS — Z8579 Personal history of other malignant neoplasms of lymphoid, hematopoietic and related tissues: Secondary | ICD-10-CM | POA: Diagnosis not present

## 2023-08-27 DIAGNOSIS — Z7901 Long term (current) use of anticoagulants: Secondary | ICD-10-CM | POA: Diagnosis not present

## 2023-08-27 LAB — COMPREHENSIVE METABOLIC PANEL
ALT: 16 U/L (ref 0–44)
AST: 21 U/L (ref 15–41)
Albumin: 3.6 g/dL (ref 3.5–5.0)
Alkaline Phosphatase: 59 U/L (ref 38–126)
Anion gap: 10 (ref 5–15)
BUN: 10 mg/dL (ref 8–23)
CO2: 26 mmol/L (ref 22–32)
Calcium: 9.1 mg/dL (ref 8.9–10.3)
Chloride: 99 mmol/L (ref 98–111)
Creatinine, Ser: 0.95 mg/dL (ref 0.44–1.00)
GFR, Estimated: >60 mL/min (ref >60)
Glucose, Bld: 89 mg/dL (ref 70–99)
Potassium: 3.5 mmol/L (ref 3.5–5.1)
Sodium: 135 mmol/L (ref 135–145)
Total Bilirubin: 0.5 mg/dL (ref 0.3–1.2)
Total Protein: 6.7 g/dL (ref 6.5–8.1)

## 2023-08-27 LAB — CBC
HCT: 37.5 % (ref 36.0–46.0)
Hemoglobin: 12.3 g/dL (ref 12.0–15.0)
MCH: 31.5 pg (ref 26.0–34.0)
MCHC: 32.8 g/dL (ref 30.0–36.0)
MCV: 95.9 fL (ref 80.0–100.0)
Platelets: 176 10*3/uL (ref 150–400)
RBC: 3.91 MIL/uL (ref 3.87–5.11)
RDW: 13 % (ref 11.5–15.5)
WBC: 5.7 10*3/uL (ref 4.0–10.5)
nRBC: 0 % (ref 0.0–0.2)

## 2023-08-27 LAB — PROTIME-INR
INR: 1.2 (ref 0.8–1.2)
Prothrombin Time: 15.4 s — ABNORMAL HIGH (ref 11.4–15.2)

## 2023-08-27 LAB — DIFFERENTIAL
Abs Immature Granulocytes: 0.02 10*3/uL (ref 0.00–0.07)
Basophils Absolute: 0.1 10*3/uL (ref 0.0–0.1)
Basophils Relative: 1 %
Eosinophils Absolute: 0.1 10*3/uL (ref 0.0–0.5)
Eosinophils Relative: 1 %
Immature Granulocytes: 0 %
Lymphocytes Relative: 18 %
Lymphs Abs: 1.1 10*3/uL (ref 0.7–4.0)
Monocytes Absolute: 0.3 10*3/uL (ref 0.1–1.0)
Monocytes Relative: 6 %
Neutro Abs: 4.2 10*3/uL (ref 1.7–7.7)
Neutrophils Relative %: 74 %

## 2023-08-27 LAB — I-STAT CHEM 8, ED
BUN: 10 mg/dL (ref 8–23)
Calcium, Ion: 1.16 mmol/L (ref 1.15–1.40)
Chloride: 98 mmol/L (ref 98–111)
Creatinine, Ser: 0.9 mg/dL (ref 0.44–1.00)
Glucose, Bld: 84 mg/dL (ref 70–99)
HCT: 40 % (ref 36.0–46.0)
Hemoglobin: 13.6 g/dL (ref 12.0–15.0)
Potassium: 3.6 mmol/L (ref 3.5–5.1)
Sodium: 138 mmol/L (ref 135–145)
TCO2: 25 mmol/L (ref 22–32)

## 2023-08-27 LAB — CBG MONITORING, ED: Glucose-Capillary: 76 mg/dL (ref 70–99)

## 2023-08-27 LAB — APTT: aPTT: 29 s (ref 24–36)

## 2023-08-27 LAB — ETHANOL: Alcohol, Ethyl (B): <10 mg/dL (ref <10)

## 2023-08-27 NOTE — ED Triage Notes (Addendum)
Patient BIB daughter who reports that friends called her reporting changes in speaking and cognition starting around 47, daughter reports that her mother is speaking childlike and is not being frustrated by difficulty finding words like usual. No unilateral deficits noted, no difficulty with words noted at this time. Patient is on eliquis.

## 2023-08-28 ENCOUNTER — Emergency Department (HOSPITAL_COMMUNITY): Payer: 59

## 2023-08-28 LAB — I-STAT VENOUS BLOOD GAS, ED
Acid-Base Excess: 3 mmol/L — ABNORMAL HIGH (ref 0.0–2.0)
Bicarbonate: 27.6 mmol/L (ref 20.0–28.0)
Calcium, Ion: 1.12 mmol/L — ABNORMAL LOW (ref 1.15–1.40)
HCT: 43 % (ref 36.0–46.0)
Hemoglobin: 14.6 g/dL (ref 12.0–15.0)
O2 Saturation: 70 %
Potassium: 3.7 mmol/L (ref 3.5–5.1)
Sodium: 137 mmol/L (ref 135–145)
TCO2: 29 mmol/L (ref 22–32)
pCO2, Ven: 41.3 mm[Hg] — ABNORMAL LOW (ref 44–60)
pH, Ven: 7.433 — ABNORMAL HIGH (ref 7.25–7.43)
pO2, Ven: 36 mm[Hg] (ref 32–45)

## 2023-08-28 LAB — AMMONIA: Ammonia: 13 umol/L (ref 9–35)

## 2023-08-28 LAB — TROPONIN I (HIGH SENSITIVITY)
Troponin I (High Sensitivity): 10 ng/L (ref <18)
Troponin I (High Sensitivity): 7 ng/L (ref <18)

## 2023-08-28 MED ORDER — LACTATED RINGERS IV SOLN: INTRAVENOUS | Status: DC

## 2023-08-28 MED ORDER — CEPHALEXIN 500 MG PO CAPS: 500.0000 mg | ORAL_CAPSULE | Freq: Four times a day (QID) | ORAL | 0 refills | Status: DC

## 2023-08-28 MED ORDER — LACTATED RINGERS IV BOLUS
500.0000 mL | Freq: Once | INTRAVENOUS | Status: AC
  Administered 2023-08-28: 500 mL via INTRAVENOUS

## 2023-08-28 MED ORDER — SODIUM CHLORIDE 0.9 % IV SOLN
100.0000 mg | Freq: Once | INTRAVENOUS | Status: AC
  Administered 2023-08-28: 100 mg via INTRAVENOUS
  Filled 2023-08-28: qty 100

## 2023-08-28 MED ORDER — ACETAMINOPHEN 325 MG PO TABS
650.0000 mg | ORAL_TABLET | Freq: Once | ORAL | Status: AC
  Administered 2023-08-28: 650 mg via ORAL
  Filled 2023-08-28: qty 2

## 2023-08-28 MED ORDER — IOHEXOL 350 MG/ML SOLN
75.0000 mL | Freq: Once | INTRAVENOUS | Status: AC | PRN
  Administered 2023-08-28: 75 mL via INTRAVENOUS

## 2023-08-28 NOTE — ED Notes (Signed)
Pt able to ambulate in hall with a cane. Pt walked to the charge desk and back to Trauma A with no problems. Daughter reports ambulation is at baseline.

## 2023-08-28 NOTE — Discharge Instructions (Signed)
Continue the topical antibiotics even while on oral antibiotics and follow up with your eye doctor as scheduled.

## 2023-08-28 NOTE — ED Provider Notes (Signed)
Emergency Department Provider Note  I have reviewed the triage vital signs and the nursing notes.  HISTORY  Chief Complaint Altered Mental Status   HPI [name] May is a 82 y.o. female with some cognitive delay that presents with her daughter who she is in close contact with.  Sounds like the patient was doing well and was out hanging out with a friend when all of a sudden between 2 and 3:00 she had a sudden change in her mental status.  She was having difficulty completing her sentences and seemed like she is almost obstructed.  She was generally weak with difficulty walking.  She had a change in her voice as well.  The friend called the daughter.  Patient had small improvement throughout the night but did not get much better so brought her here for further evaluation.  She has redness around her eye her daughter states that she was diagnosed with a stye and started on some topical antibiotics by her ophthalmologist a day or 2 ago.  No urinary symptoms.  No other symptoms that the daughter knows of.  Patient still having some difficulty with recalling events so not a reliable historian.  PMH Past Medical History:  Diagnosis Date   Allergy    Anemia    Arthritis    left knee   Back pain    Bipolar disorder (HCC)    Chronic headaches    COPD (chronic obstructive pulmonary disease) (HCC)    Degenerative disorder of bone    Depression    Esophageal ulcer    history   GERD (gastroesophageal reflux disease)    Hyperlipidemia    Myeloma (HCC)    Peripheral vascular disease (HCC)    Pulmonary embolism (HCC)    Scoliosis     Home Medications Prior to Admission medications   Medication Sig Start Date End Date Taking? Authorizing Provider  acetaminophen (TYLENOL) 650 MG CR tablet Take 1,300 mg by mouth every 8 (eight) hours as needed for pain (or headaches).   Yes [provider]  albuterol (VENTOLIN HFA) 108 (90 Base) MCG/ACT inhaler Inhale 2 puffs into the lungs every  6 (six) hours as needed for wheezing or shortness of breath.   Yes [provider]  apixaban (ELIQUIS) 2.5 MG TABS tablet Take 1 tablet by mouth 2 (two) times daily. 07/11/22  Yes [provider]  ascorbic acid (VITAMIN C) 500 MG tablet Take 500 mg by mouth daily.    Yes [provider]  atorvastatin (LIPITOR) 10 MG tablet Take by mouth. 09/22/21  Yes [provider]  buPROPion (WELLBUTRIN XL) 300 MG 24 hr tablet Take 300 mg by mouth in the morning.  02/25/14  Yes [provider]  cephALEXin (KEFLEX) 500 MG capsule Take 1 capsule (500 mg total) by mouth 4 (four) times daily. 08/28/23  Yes Shaneca Orne, Barbara Cower, MD  Cholecalciferol 125 MCG (5000 UT) TABS Take by mouth. 09/08/19  Yes [provider]  cholestyramine (QUESTRAN) 4 g packet MIX AND DRINK 1 PACKET BY MOUTH THREE TIMES DAILY WITH MEALS 10/08/22  Yes [provider]  dicyclomine (BENTYL) 20 MG tablet Take 20 mg by mouth in the morning and at bedtime.   Yes [provider]  estradiol (ESTRACE) 0.1 MG/GM vaginal cream Apply in vagina with index finger a small amount every other night 06/19/22  Yes [provider]  gabapentin (NEURONTIN) 600 MG tablet Take 600 mg by mouth 2 (two) times daily. 03/09/20  Yes [provider]  HYDROcodone-acetaminophen (NORCO) 10-325 MG tablet Take 1 tablet by mouth 3 (three) times daily as needed. Patient taking differently: Take 1-2 tablets by mouth 2 (two) times daily as needed for moderate pain (pain score 4-6). 07/12/23  Yes Jones Bales, NP  ibandronate (BONIVA) 150 MG tablet Take 150 mg by mouth every 30 (thirty) days. 01/02/22  Yes [provider]  iron polysaccharides (NIFEREX) 150 MG capsule Take 150 mg by mouth 2 (two) times daily. 02/10/18  Yes [provider]  lamoTRIgine (LAMICTAL) 200 MG tablet Take 200 mg by mouth every evening. 01/31/15  Yes [provider]  lenalidomide (REVLIMID) 15 MG capsule  Take by mouth. 10/09/22  Yes [provider]  lenalidomide (REVLIMID) 5 MG capsule Take 5 mg by mouth See admin instructions. Take 1 tablet by mouth daily for 21 days, then stop for 7 days 12/31/22  Yes [provider]  loratadine (CLARITIN) 10 MG tablet Take 10 mg by mouth daily. 05/21/17  Yes [provider]  LORazepam (ATIVAN) 0.5 MG tablet Take 0.5 mg by mouth every morning. 10/02/17  Yes [provider]  Multiple Vitamins-Minerals (PRESERVISION AREDS PO) Take 1 tablet by mouth daily.   Yes [provider]  NARCAN 4 MG/0.1ML LIQD nasal spray kit SMARTSIG:1 Spray(s) Both Nares Once PRN 07/05/22  Yes [provider]  neomycin-polymyxin b-dexamethasone (MAXITROL) 3.5-10000-0.1 OINT Place 1 Application into the right eye 3 (three) times daily. 08/26/23  Yes [provider]  pantoprazole (PROTONIX) 40 MG tablet Take 40 mg by mouth 2 (two) times daily. 01/24/14  Yes [provider]  potassium chloride SA (KLOR-CON M) 20 MEQ tablet Take 20 mEq by mouth daily. 10/25/22  Yes [provider]  rOPINIRole (REQUIP) 0.25 MG tablet Take 0.25 mg by mouth at bedtime.   Yes [provider]  rOPINIRole (REQUIP) 0.5 MG tablet Take 0.5 mg by mouth every evening. 10/24/22  Yes [provider]  SUMAtriptan (IMITREX) 50 MG tablet Take 1 tablet (50 mg total) by mouth once as needed for migraine (and may repeat once in 2 hours, if no relief). 11/28/21  Yes Kirsteins, Victorino Sparrow, MD  umeclidinium bromide (INCRUSE ELLIPTA) 62.5 MCG/ACT AEPB Inhale into the lungs. 03/05/23  Yes [provider]    Social History Social History   Tobacco Use   Smoking status: Former   Smokeless tobacco: Never   Tobacco comments:    quit 25 years ago   Vaping Use   Vaping status: Never Used  Substance Use Topics   Alcohol use: No   Drug use: No    Review of Systems: Documented in  HPI ____________________________________________  PHYSICAL EXAM: VITAL SIGNS: ED Triage Vitals  Encounter Vitals Group     BP 08/27/23 2231 118/84     Systolic BP Percentile --      Diastolic BP Percentile --      Pulse Rate 08/27/23 2231 (!) 109     Resp 08/27/23 2231 17     Temp 08/27/23 2231 98.3 F (36.8 C)     Temp Source 08/27/23 2231 Oral     SpO2 08/27/23 2231 (!) 88 %     Weight 08/27/23 2253 135 lb (61.2 kg)     Height 08/27/23 2253 4\' 9"  (1.448 m)     Head Circumference --      Peak Flow --      Pain Score 08/27/23 2247 4     Pain Loc --  Pain Education --      Exclude from Growth Chart --    Physical Exam Vitals and nursing note reviewed.  Constitutional:      Appearance: She is well-developed.  HENT:     Head: Normocephalic and atraumatic.  Eyes:     Extraocular Movements: Extraocular movements intact.     Pupils: Pupils are equal, round, and reactive to light.     Comments: Erythema and swelling around the right eye with an open wound with purulent drainage.  Erythema, induration and tenderness extend about halfway down her right cheek.  No fluctuance.  No pain with extraocular movements.  No proptosis.  No vision changes although has poor vision at baseline.  Cardiovascular:     Rate and Rhythm: Normal rate and regular rhythm.  Pulmonary:     Effort: No respiratory distress.     Breath sounds: No stridor. No wheezing or rales.  Abdominal:     General: There is no distension.  Musculoskeletal:     Cervical back: Normal range of motion.  Neurological:     Mental Status: She is alert.       ____________________________________________   LABS (all labs ordered are listed, but only abnormal results are displayed)  Labs Reviewed  PROTIME-INR - Abnormal; Notable for the following components:      Result Value   Prothrombin Time 15.4 (*)    All other components within normal limits  I-STAT VENOUS BLOOD GAS, ED - Abnormal; Notable for the  following components:   pH, Ven 7.433 (*)    pCO2, Ven 41.3 (*)    Acid-Base Excess 3.0 (*)    Calcium, Ion 1.12 (*)    All other components within normal limits  CULTURE, BLOOD (ROUTINE X 2)  CULTURE, BLOOD (ROUTINE X 2)  APTT  CBC  DIFFERENTIAL  COMPREHENSIVE METABOLIC PANEL  ETHANOL  AMMONIA  I-STAT CHEM 8, ED  CBG MONITORING, ED  TROPONIN I (HIGH SENSITIVITY)  TROPONIN I (HIGH SENSITIVITY)   ____________________________________________  EKG   EKG Interpretation Date/Time:  Wednesday August 28 2023 00:12:23 EDT Ventricular Rate:  98 PR Interval:  152 QRS Duration:  92 QT Interval:  355 QTC Calculation: 454 R Axis:   -2  Text Interpretation: Sinus rhythm Nonspecific T abnormalities, lateral leads Confirmed by Marily Memos [id number]) on 08/28/2023 2:29:14 AM        ____________________________________________  RADIOLOGY  No results found. ____________________________________________  PROCEDURES  Procedure(s) performed:   Procedures ____________________________________________  INITIAL IMPRESSION / ASSESSMENT AND PLAN   This patient presents to the ED for concern of altered mental status, this involves an extensive number of treatment options, and is a complaint that carries with it a high risk of complications and morbidity.  The differential diagnosis includes infectious, metabolic, encephalopathic, CVA.  Will start with lab workup.  She feels warm so we will get a rectal temperature as well.  Additional history obtained:  Additional history obtained from daughter at bedside Previous records obtained and reviewed in epic  Co morbidities that complicate the patient evaluation  Cognitive decline  Social Determinants of Health:  Lives by herself and is not the best historian  ED Course  Images ordered viewed and obtained by myself. Agree with Radiology interpretation. Details in ED course.  Labs ordered reviewed by myself as detailed in ED course.   Consultations obtained/considered detailed in ED course.   Clinical Course as of 08/29/23 0310  Wed Aug 28, 2023  0225 CT Orbits W Contrast No  obvious abscess under right eye or proptosis. Suspect just periorbital cellulitis. Abx already givne.  [JM]  0225 DG Chest Portable 1 View No obvious pneumonia on my intepretation [JM]  0225 CT HEAD WO CONTRAST No obvious bleed/stroke on my interpretation [JM]  0230 Pulse Rate(!): 109 Likely related to the fever [JM]  0230 SpO2(!): 88 % Wears O2 at home at night. 92 on my eval on RA and they state that is good for her (emphysema). [JM]  0230 pH, Ven(!): 7.433 reassuring [JM]  0230 WBC: 5.7 [JM]  0303 CT Orbits W Contrast Radiology read without abscess [JM]    Clinical Course User Index [JM] Shiquita Collignon, Barbara Cower, MD      Cardiac Monitoring:  The patient was maintained on a cardiac monitor.  I personally viewed and interpreted the cardiac monitored which showed an underlying rhythm of: sinus  CRITICAL INTERVENTIONS:  Fluids, antibiotics, repeat evaluations for stability  Reevaluation:  After the interventions noted above, I reevaluated the patient and found that they have :improved .  Daughter states the patient is speaking more normal now but not quite 100% normal.  She states that she is also walking much more normal and has less weakness compared to previously.  Feels comfortable taking her home on antibiotics.  Already has ophthalmology follow-up.  Discussed signs symptoms of orbital cellulitis, bacteremia and will return for any of these.  FINAL IMPRESSION AND PLAN Final diagnoses:  Periorbital cellulitis of right eye    A medical screening exam was performed and I feel the patient has had an appropriate workup for their chief complaint at this time and likelihood of emergent condition existing is low. They have been counseled on decision, DISCHARGE, follow up and which symptoms necessitate immediate return to the emergency department.  They or their family verbally stated understanding and agreement with plan and discharged in stable condition.   ____________________________________________   NEW OUTPATIENT MEDICATIONS STARTED DURING THIS VISIT:  Discharge Medication List as of 08/28/2023  4:40 AM     START taking these medications   Details  cephALEXin (KEFLEX) 500 MG capsule Take 1 capsule (500 mg total) by mouth 4 (four) times daily., Starting Wed 08/28/2023, Normal        Note:  This note was prepared with assistance of Dragon voice recognition software. Occasional wrong-word or sound-a-like substitutions may have occurred due to the inherent limitations of voice recognition software.    Jearlene Bridwell, Barbara Cower, MD 08/29/23 [phone]

## 2023-08-28 NOTE — ED Notes (Signed)
IV removed. Pt dressed appropriately for discharge. Pt verbalized understanding of discharge instructions.

## 2023-08-29 NOTE — Progress Notes (Deleted)
Subjective:    Patient ID: [name] May, female    DOB: 03/24/1941, 82 y.o.   MRN: [id number]  HPI   Pain Inventory Average Pain {NUMBERS; 0-10:5044} Pain Right Now {NUMBERS; 0-10:5044} My pain is {PAIN DESCRIPTION:21022940}  In the last 24 hours, has pain interfered with the following? General activity {NUMBERS; 0-10:5044} Relation with others {NUMBERS; 0-10:5044} Enjoyment of life {NUMBERS; 0-10:5044} What TIME of day is your pain at its worst? {time of day:24191} Sleep (in general) {BHH GOOD/FAIR/POOR:22877}  Pain is worse with: {ACTIVITIES:21022942} Pain improves with: {PAIN IMPROVES VHQI:69629528} Relief from Meds: {NUMBERS; 0-10:5044}  Family History  Problem Relation Age of Onset   Deep vein thrombosis Neg Hx    Social History   Socioeconomic History   Marital status: Widowed    Spouse name: Not on file   Number of children: Not on file   Years of education: Not on file   Highest education level: Not on file  Occupational History   Not on file  Tobacco Use   Smoking status: Former   Smokeless tobacco: Never   Tobacco comments:    quit 25 years ago   Vaping Use   Vaping status: Never Used  Substance and Sexual Activity   Alcohol use: No   Drug use: No   Sexual activity: Not Currently  Other Topics Concern   Not on file  Social History Narrative   Not on file   Social Determinants of Health   Financial Resource Strain: Not on file  Food Insecurity: Low Risk  (07/09/2023)   Received from [facility]   Hunger Vital Sign    Worried About Running Out of Food in the Last Year: Never true    Ran Out of Food in the Last Year: Never true  Transportation Needs: No Transportation Needs (07/09/2023)   Received from [facility]    In the past 12 months, has lack of reliable transportation kept you from medical appointments, meetings, work or from getting things needed for daily living? : No  Physical Activity: Not on file  Stress: Not  on file  Social Connections: Unknown (05/27/2023)   Received from [facility]   Social Network    Social Network: Not on file   Past Surgical History:  Procedure Laterality Date   ABDOMINAL HYSTERECTOMY     ANKLE SURGERY     COLONOSCOPY     CORONARY ULTRASOUND/IVUS N/A 05/06/2017   Procedure: INTRAVASCULAR ULTRASOUND/IVUS;  Surgeon: Maeola Harman, MD;  Location: [facility] OR;  Service: Vascular;  Laterality: N/A;   EYE SURGERY     caratacs   FEMORAL ARTERY EXPLORATION  05/16/2017   Procedure: STENT OF BILATERAL COMMON FEMORAL VEIN WITH 16 X 90 WALL STENT;  Surgeon: Maeola Harman, MD;  Location: [facility] OR;  Service: Vascular;;   hemroidectomy     INSERTION OF ILIAC STENT Bilateral 05/16/2017   Procedure: STENT OF BILATERAL COMMON AND EXTERNAL ILIAC VEIN WITH 18 X 90 WALL STENT;  Surgeon: Maeola Harman, MD;  Location: [facility] OR;  Service: Vascular;  Laterality: Bilateral;   IVC FILTER INSERTION N/A 05/16/2017   Procedure: MECHANICAL THROMBECTOMY OF IVC FILTER;  Surgeon: Maeola Harman, MD;  Location: [facility] OR;  Service: Vascular;  Laterality: N/A;   LAMINECTOMY     LOWER EXTREMITY VENOGRAPHY N/A 05/14/2017   Procedure: Lower Extremity Venography;  Surgeon: Nada Libman, MD;  Location: [facility];  Service: Cardiovascular;  Laterality: N/A;   NASAL  SINUS SURGERY     PERCUTANEOUS VENOUS THROMBECTOMY,LYSIS WITH INTRAVASCULAR ULTRASOUND (IVUS) N/A 05/16/2017   Procedure: PERCUTANEOUS VENOUS THROMBECTOMY AND LYSIS WITH INTRAVASCULAR ULTRASOUND (IVUS);  Surgeon: Maeola Harman, MD;  Location: [facility] OR;  Service: Vascular;  Laterality: N/A;   VENA CAVA FILTER PLACEMENT N/A 05/16/2017   Procedure: RETRIEVAL INFERIOR VENA-CAVA FILTER;  Surgeon: Maeola Harman, MD;  Location: [facility] OR;  Service: Vascular;  Laterality: N/A;   VENOGRAM Bilateral 05/16/2017   Procedure: VENOGRAM OF IVC;  Surgeon: Maeola Harman, MD;  Location: [facility] OR;  Service:  Vascular;  Laterality: Bilateral;   Past Surgical History:  Procedure Laterality Date   ABDOMINAL HYSTERECTOMY     ANKLE SURGERY     COLONOSCOPY     CORONARY ULTRASOUND/IVUS N/A 05/06/2017   Procedure: INTRAVASCULAR ULTRASOUND/IVUS;  Surgeon: Maeola Harman, MD;  Location: [facility] OR;  Service: Vascular;  Laterality: N/A;   EYE SURGERY     caratacs   FEMORAL ARTERY EXPLORATION  05/16/2017   Procedure: STENT OF BILATERAL COMMON FEMORAL VEIN WITH 16 X 90 WALL STENT;  Surgeon: Maeola Harman, MD;  Location: [facility] OR;  Service: Vascular;;   hemroidectomy     INSERTION OF ILIAC STENT Bilateral 05/16/2017   Procedure: STENT OF BILATERAL COMMON AND EXTERNAL ILIAC VEIN WITH 18 X 90 WALL STENT;  Surgeon: Maeola Harman, MD;  Location: [facility] OR;  Service: Vascular;  Laterality: Bilateral;   IVC FILTER INSERTION N/A 05/16/2017   Procedure: MECHANICAL THROMBECTOMY OF IVC FILTER;  Surgeon: Maeola Harman, MD;  Location: [facility] OR;  Service: Vascular;  Laterality: N/A;   LAMINECTOMY     LOWER EXTREMITY VENOGRAPHY N/A 05/14/2017   Procedure: Lower Extremity Venography;  Surgeon: Nada Libman, MD;  Location: [facility];  Service: Cardiovascular;  Laterality: N/A;   NASAL SINUS SURGERY     PERCUTANEOUS VENOUS THROMBECTOMY,LYSIS WITH INTRAVASCULAR ULTRASOUND (IVUS) N/A 05/16/2017   Procedure: PERCUTANEOUS VENOUS THROMBECTOMY AND LYSIS WITH INTRAVASCULAR ULTRASOUND (IVUS);  Surgeon: Maeola Harman, MD;  Location: [facility] OR;  Service: Vascular;  Laterality: N/A;   VENA CAVA FILTER PLACEMENT N/A 05/16/2017   Procedure: RETRIEVAL INFERIOR VENA-CAVA FILTER;  Surgeon: Maeola Harman, MD;  Location: [facility] OR;  Service: Vascular;  Laterality: N/A;   VENOGRAM Bilateral 05/16/2017   Procedure: VENOGRAM OF IVC;  Surgeon: Maeola Harman, MD;  Location: [facility] OR;  Service: Vascular;  Laterality: Bilateral;   Past Medical History:  Diagnosis Date   Allergy    Anemia     Arthritis    left knee   Back pain    Bipolar disorder (HCC)    Chronic headaches    COPD (chronic obstructive pulmonary disease) (HCC)    Degenerative disorder of bone    Depression    Esophageal ulcer    history   GERD (gastroesophageal reflux disease)    Hyperlipidemia    Myeloma (HCC)    Peripheral vascular disease (HCC)    Pulmonary embolism (HCC)    Scoliosis    There were no vitals taken for this visit.  Opioid Risk Score:   Fall Risk Score:  `1  Depression screen PHQ 2/9     07/12/2023    9:05 AM 05/14/2023    9:54 AM 01/28/2023    1:45 PM 12/12/2022    1:42 PM 11/01/2022    1:37 PM 08/06/2022    1:20 PM 07/12/2022   10:31 AM  Depression screen PHQ 2/9  Decreased Interest 1 1 1  0 1 1 1   Down, Depressed, Hopeless 1 1 1  0 1 1 1   PHQ - 2 Score 2 2 2  0 2 2 2     Review of Systems     Objective:   Physical Exam        Assessment & Plan:

## 2023-08-30 ENCOUNTER — Encounter: Payer: 59 | Admitting: Registered Nurse

## 2023-09-02 ENCOUNTER — Encounter: Payer: 59 | Attending: Physical Medicine & Rehabilitation | Admitting: Registered Nurse

## 2023-09-02 ENCOUNTER — Encounter: Payer: Self-pay | Admitting: Registered Nurse

## 2023-09-02 VITALS — Temp 97.7°F | Ht <= 58 in | Wt 135.0 lb

## 2023-09-02 DIAGNOSIS — M25561 Pain in right knee: Secondary | ICD-10-CM | POA: Insufficient documentation

## 2023-09-02 DIAGNOSIS — G5712 Meralgia paresthetica, left lower limb: Secondary | ICD-10-CM

## 2023-09-02 DIAGNOSIS — M25511 Pain in right shoulder: Secondary | ICD-10-CM | POA: Diagnosis not present

## 2023-09-02 DIAGNOSIS — M546 Pain in thoracic spine: Secondary | ICD-10-CM | POA: Insufficient documentation

## 2023-09-02 DIAGNOSIS — Z79891 Long term (current) use of opiate analgesic: Secondary | ICD-10-CM

## 2023-09-02 DIAGNOSIS — M47817 Spondylosis without myelopathy or radiculopathy, lumbosacral region: Secondary | ICD-10-CM

## 2023-09-02 DIAGNOSIS — G8929 Other chronic pain: Secondary | ICD-10-CM

## 2023-09-02 DIAGNOSIS — G894 Chronic pain syndrome: Secondary | ICD-10-CM

## 2023-09-02 DIAGNOSIS — Z5181 Encounter for therapeutic drug level monitoring: Secondary | ICD-10-CM

## 2023-09-02 DIAGNOSIS — M25562 Pain in left knee: Secondary | ICD-10-CM | POA: Insufficient documentation

## 2023-09-02 LAB — CULTURE, BLOOD (ROUTINE X 2)
Culture: NO GROWTH
Culture: NO GROWTH
Special Requests: ADEQUATE

## 2023-09-02 NOTE — Progress Notes (Signed)
Subjective:    Patient ID: [name] May, female    DOB: 22-Mar-1941, 82 y.o.   MRN: [id number]  HPI: [name] May is a 82 y.o. female who is scheduled for Virtual Visit, [name] May May has COVID. I connected with  [name] May by a video enabled telemedicine application and verified that I am speaking with the correct person using two identifiers.  Location: Patient: In her Home  Provider: In the Office    I discussed the limitations of evaluation and management by telemedicine and the availability of in person appointments. The patient expressed understanding and agreed to proceed.  She states her pain is located in her right shoulder, mid- lower back, left lower extremity and bilateral knees L>R. She rates her pain 4. Her current exercise regime is walking with a cane or walker.  [name] May on 08/27/2023, Altered Mental Status, note was reviewed.   [name] May is 30.00 MME.   She is also prescribed Lorazepam  by Dr. Donell Beers .We have discussed the black box warning of using opioids and benzodiazepines. I highlighted the dangers of using these drugs together and discussed the adverse events including respiratory suppression, overdose, cognitive impairment and importance of compliance with current regimen. We will continue to monitor and adjust as indicated.  she is being closely monitored and under the care of her psychiatrist.  Last UDS was Performed on 05/14/2023, it was consistent.    Pain Inventory Average Pain 6 Pain Right Now 4 My pain is intermittent, sharp, burning, dull, stabbing, tingling, and aching  In the last 24 hours, has pain interfered with the following? General activity 7 Relation with others 0 Enjoyment of life  NOT ABLE TO ANSWER What TIME of day is your pain at its worst? morning , daytime, evening, and night Sleep (in general) Good  Pain is worse with: walking, bending, sitting, inactivity,  standing, and some activites Pain improves with: pacing activities, medication, injections, and HEAT Relief from Meds: 10  Family History  Problem Relation Age of Onset   Deep vein thrombosis Neg Hx    Social History   Socioeconomic History   Marital status: Widowed    Spouse name: Not on file   Number of children: Not on file   Years of education: Not on file   Highest education level: Not on file  Occupational History   Not on file  Tobacco Use   Smoking status: Former   Smokeless tobacco: Never   Tobacco comments:    quit 25 years ago   Vaping Use   Vaping status: Never Used  Substance and Sexual Activity   Alcohol use: No   Drug use: No   Sexual activity: Not Currently  Other Topics Concern   Not on file  Social History Narrative   Not on file   Social Determinants of Health   Financial Resource Strain: Not on file  Food Insecurity: Low Risk  (07/09/2023)   Received from [facility]   Hunger Vital Sign    Worried About Running Out of Food in the Last Year: Never true    Ran Out of Food in the Last Year: Never true  Transportation Needs: No Transportation Needs (07/09/2023)   Received from [facility]    In the past 12 months, has lack of reliable transportation kept you from medical appointments, meetings, work or from getting things needed for daily living? : No  Physical Activity:  Not on file  Stress: Not on file  Social Connections: Unknown (05/27/2023)   Received from [facility]   Social Network    Social Network: Not on file   Past Surgical History:  Procedure Laterality Date   ABDOMINAL HYSTERECTOMY     ANKLE SURGERY     COLONOSCOPY     CORONARY ULTRASOUND/IVUS N/A 05/06/2017   Procedure: INTRAVASCULAR ULTRASOUND/IVUS;  Surgeon: Maeola Harman, MD;  Location: [facility] OR;  Service: Vascular;  Laterality: N/A;   EYE SURGERY     caratacs   FEMORAL ARTERY EXPLORATION  05/16/2017   Procedure: STENT OF BILATERAL COMMON FEMORAL  VEIN WITH 16 X 90 WALL STENT;  Surgeon: Maeola Harman, MD;  Location: [facility] OR;  Service: Vascular;;   hemroidectomy     INSERTION OF ILIAC STENT Bilateral 05/16/2017   Procedure: STENT OF BILATERAL COMMON AND EXTERNAL ILIAC VEIN WITH 18 X 90 WALL STENT;  Surgeon: Maeola Harman, MD;  Location: [facility] OR;  Service: Vascular;  Laterality: Bilateral;   IVC FILTER INSERTION N/A 05/16/2017   Procedure: MECHANICAL THROMBECTOMY OF IVC FILTER;  Surgeon: Maeola Harman, MD;  Location: [facility] OR;  Service: Vascular;  Laterality: N/A;   LAMINECTOMY     LOWER EXTREMITY VENOGRAPHY N/A 05/14/2017   Procedure: Lower Extremity Venography;  Surgeon: Nada Libman, MD;  Location: [facility];  Service: Cardiovascular;  Laterality: N/A;   NASAL SINUS SURGERY     PERCUTANEOUS VENOUS THROMBECTOMY,LYSIS WITH INTRAVASCULAR ULTRASOUND (IVUS) N/A 05/16/2017   Procedure: PERCUTANEOUS VENOUS THROMBECTOMY AND LYSIS WITH INTRAVASCULAR ULTRASOUND (IVUS);  Surgeon: Maeola Harman, MD;  Location: [facility] OR;  Service: Vascular;  Laterality: N/A;   VENA CAVA FILTER PLACEMENT N/A 05/16/2017   Procedure: RETRIEVAL INFERIOR VENA-CAVA FILTER;  Surgeon: Maeola Harman, MD;  Location: [facility] OR;  Service: Vascular;  Laterality: N/A;   VENOGRAM Bilateral 05/16/2017   Procedure: VENOGRAM OF IVC;  Surgeon: Maeola Harman, MD;  Location: [facility] OR;  Service: Vascular;  Laterality: Bilateral;   Past Surgical History:  Procedure Laterality Date   ABDOMINAL HYSTERECTOMY     ANKLE SURGERY     COLONOSCOPY     CORONARY ULTRASOUND/IVUS N/A 05/06/2017   Procedure: INTRAVASCULAR ULTRASOUND/IVUS;  Surgeon: Maeola Harman, MD;  Location: [facility] OR;  Service: Vascular;  Laterality: N/A;   EYE SURGERY     caratacs   FEMORAL ARTERY EXPLORATION  05/16/2017   Procedure: STENT OF BILATERAL COMMON FEMORAL VEIN WITH 16 X 90 WALL STENT;  Surgeon: Maeola Harman, MD;  Location: [facility] OR;  Service:  Vascular;;   hemroidectomy     INSERTION OF ILIAC STENT Bilateral 05/16/2017   Procedure: STENT OF BILATERAL COMMON AND EXTERNAL ILIAC VEIN WITH 18 X 90 WALL STENT;  Surgeon: Maeola Harman, MD;  Location: [facility] OR;  Service: Vascular;  Laterality: Bilateral;   IVC FILTER INSERTION N/A 05/16/2017   Procedure: MECHANICAL THROMBECTOMY OF IVC FILTER;  Surgeon: Maeola Harman, MD;  Location: [facility] OR;  Service: Vascular;  Laterality: N/A;   LAMINECTOMY     LOWER EXTREMITY VENOGRAPHY N/A 05/14/2017   Procedure: Lower Extremity Venography;  Surgeon: Nada Libman, MD;  Location: [facility];  Service: Cardiovascular;  Laterality: N/A;   NASAL SINUS SURGERY     PERCUTANEOUS VENOUS THROMBECTOMY,LYSIS WITH INTRAVASCULAR ULTRASOUND (IVUS) N/A 05/16/2017   Procedure: PERCUTANEOUS VENOUS THROMBECTOMY AND LYSIS WITH INTRAVASCULAR ULTRASOUND (IVUS);  Surgeon: Maeola Harman, MD;  Location: [facility] OR;  Service: Vascular;  Laterality: N/A;   VENA CAVA FILTER PLACEMENT N/A 05/16/2017   Procedure: RETRIEVAL INFERIOR VENA-CAVA FILTER;  Surgeon: Maeola Harman, MD;  Location: [facility] OR;  Service: Vascular;  Laterality: N/A;   VENOGRAM Bilateral 05/16/2017   Procedure: VENOGRAM OF IVC;  Surgeon: Maeola Harman, MD;  Location: [facility] OR;  Service: Vascular;  Laterality: Bilateral;   Past Medical History:  Diagnosis Date   Allergy    Anemia    Arthritis    left knee   Back pain    Bipolar disorder (HCC)    Chronic headaches    COPD (chronic obstructive pulmonary disease) (HCC)    Degenerative disorder of bone    Depression    Esophageal ulcer    history   GERD (gastroesophageal reflux disease)    Hyperlipidemia    Myeloma (HCC)    Peripheral vascular disease (HCC)    Pulmonary embolism (HCC)    Scoliosis    There were no vitals taken for this visit.  Opioid Risk Score:   Fall Risk Score:  `1  Depression screen PHQ 2/9     07/12/2023    9:05 AM 05/14/2023     9:54 AM 01/28/2023    1:45 PM 12/12/2022    1:42 PM 11/01/2022    1:37 PM 08/06/2022    1:20 PM 07/12/2022   10:31 AM  Depression screen PHQ 2/9  Decreased Interest 1 1 1  0 1 1 1   Down, Depressed, Hopeless 1 1 1  0 1 1 1   PHQ - 2 Score 2 2 2  0 2 2 2       Review of Systems  Musculoskeletal:  Positive for back pain.       LEFT LEG PAIN, PAIN IN BOTH KNEES, Right shoulder pain  All other systems reviewed and are negative.      Objective:   Physical Exam Vitals and nursing note reviewed.  Musculoskeletal:     Comments: No Physical Exam Performed : Virtual Visit          Assessment & Plan:  1.  History of osteoporosis with multiple thoracic and lumbar compression fractures as well as bilateral rib fractures, patient is no longer symptomatic at these levels. We will continue to Monitor. 09/02/2023  2.  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 Continue to Monitor. 09/02/2023  3. Bilateral Knee Pain/ Left Knee knee pain: S/P Fall : Hospitalized  at [facility] on 05/25/2022 For: 1.  Acute nondisplaced left patellar fracture with associated hemarthrosis: 2/2 to fall. Orthopedics recommended left knee immobilizer, WBAT, Has Therapy weekly.  Orthopedics following. No falls this month. Continue to monitor. Continue HEP as Tolerated. Continue to Monitor. 09/02/2023 4.  Chronic pain syndrome multifactorial continue hydrocodone 10 mg /325 1 tablet three a day as needed for pain #75.Marland KitchenSecond script sent for the following month. We will continue the opioid monitoring program, this consists of regular clinic visits, examinations, urine drug screen, pill counts as well as use of [place] Controlled Substance Reporting system. A 12 month History has been reviewed on the [place] Controlled Substance Reporting System on 09/02/2023   5. Lumbosacral Spondylosis without Myelopathy: Continue HEP as Tolerated. Continue  current medication regimen. Continue to Monitor. 09/02/2023 6. Chronic Right  Shoulder Pain: Continue HEP as Tolerated. Continue to monitor. 09/02/2023 7. Chronic Left Hip Pain:  No complaints today. Continue HEP as tolerated. Continue current medication regimen. Continue to monitor. 09/02/2023  8. Meralgia Paresthesia of Left Lower extremity: Continue current medication regimen. Continue to Monitor.   F/U in 2 months

## 2023-10-29 NOTE — Progress Notes (Unsigned)
Subjective:    Patient ID: [name] May, female    DOB: [birth year], 82 y.o.   MRN: [id number]  HPI: [name] May is a 82 y.o. female who returns for follow up appointment for chronic pain and medication refill. states *** pain is located in  ***. rates pain ***. current exercise regime is walking and performing stretching exercises.  [name] Morphine equivalent is *** MME.   UDS ordered today.     Pain Inventory Average Pain 6 Pain Right Now 6 My pain is constant, burning, and aching  In the last 24 hours, has pain interfered with the following? General activity 3 Relation with others 3 Enjoyment of life 5 What TIME of day is your pain at its worst? evening and night Sleep (in general) Fair  Pain is worse with: standing Pain improves with: medication Relief from Meds: 8  Family History  Problem Relation Age of Onset   Deep vein thrombosis Neg Hx    Social History   Socioeconomic History   Marital status: Widowed    Spouse name: Not on file   Number of children: Not on file   Years of education: Not on file   Highest education level: Not on file  Occupational History   Not on file  Tobacco Use   Smoking status: Former   Smokeless tobacco: Never   Tobacco comments:    quit 25 years ago   Vaping Use   Vaping status: Never Used  Substance and Sexual Activity   Alcohol use: No   Drug use: No   Sexual activity: Not Currently  Other Topics Concern   Not on file  Social History Narrative   Not on file   Social Drivers of Health   Financial Resource Strain: Not on file  Food Insecurity: Low Risk  (07/09/2023)   Received from [facility]   Hunger Vital Sign    Worried About Running Out of Food in the Last Year: Never true    Ran Out of Food in the Last Year: Never true  Transportation Needs: No Transportation Needs (07/09/2023)   Received from [facility]    In the past 12 months, has lack of reliable transportation kept you  from medical appointments, meetings, work or from getting things needed for daily living? : No  Physical Activity: Not on file  Stress: Not on file  Social Connections: Unknown (05/27/2023)   Received from [facility]   Social Network    Social Network: Not on file   Past Surgical History:  Procedure Laterality Date   ABDOMINAL HYSTERECTOMY     ANKLE SURGERY     COLONOSCOPY     CORONARY ULTRASOUND/IVUS N/A 05/06/2017   Procedure: INTRAVASCULAR ULTRASOUND/IVUS;  Surgeon: Maeola Harman, MD;  Location: [facility] OR;  Service: Vascular;  Laterality: N/A;   EYE SURGERY     caratacs   FEMORAL ARTERY EXPLORATION  05/16/2017   Procedure: STENT OF BILATERAL COMMON FEMORAL VEIN WITH 16 X 90 WALL STENT;  Surgeon: Maeola Harman, MD;  Location: [facility] OR;  Service: Vascular;;   hemroidectomy     INSERTION OF ILIAC STENT Bilateral 05/16/2017   Procedure: STENT OF BILATERAL COMMON AND EXTERNAL ILIAC VEIN WITH 18 X 90 WALL STENT;  Surgeon: Maeola Harman, MD;  Location: [facility] OR;  Service: Vascular;  Laterality: Bilateral;   IVC FILTER INSERTION N/A 05/16/2017   Procedure: MECHANICAL THROMBECTOMY OF IVC FILTER;  Surgeon: Maeola Harman, MD;  Location: [facility] OR;  Service: Vascular;  Laterality: N/A;   LAMINECTOMY     LOWER EXTREMITY VENOGRAPHY N/A 05/14/2017   Procedure: Lower Extremity Venography;  Surgeon: Nada Libman, MD;  Location: [facility] INVASIVE [facility] LAB;  Service: Cardiovascular;  Laterality: N/A;   NASAL SINUS SURGERY     PERCUTANEOUS VENOUS THROMBECTOMY,LYSIS WITH INTRAVASCULAR ULTRASOUND (IVUS) N/A 05/16/2017   Procedure: PERCUTANEOUS VENOUS THROMBECTOMY AND LYSIS WITH INTRAVASCULAR ULTRASOUND (IVUS);  Surgeon: Maeola Harman, MD;  Location: [facility] OR;  Service: Vascular;  Laterality: N/A;   VENA CAVA FILTER PLACEMENT N/A 05/16/2017   Procedure: RETRIEVAL INFERIOR VENA-CAVA FILTER;  Surgeon: Maeola Harman, MD;  Location: [facility] OR;  Service: Vascular;   Laterality: N/A;   VENOGRAM Bilateral 05/16/2017   Procedure: VENOGRAM OF IVC;  Surgeon: Maeola Harman, MD;  Location: [facility] OR;  Service: Vascular;  Laterality: Bilateral;   Past Surgical History:  Procedure Laterality Date   ABDOMINAL HYSTERECTOMY     ANKLE SURGERY     COLONOSCOPY     CORONARY ULTRASOUND/IVUS N/A 05/06/2017   Procedure: INTRAVASCULAR ULTRASOUND/IVUS;  Surgeon: Maeola Harman, MD;  Location: [facility] OR;  Service: Vascular;  Laterality: N/A;   EYE SURGERY     caratacs   FEMORAL ARTERY EXPLORATION  05/16/2017   Procedure: STENT OF BILATERAL COMMON FEMORAL VEIN WITH 16 X 90 WALL STENT;  Surgeon: Maeola Harman, MD;  Location: [facility] OR;  Service: Vascular;;   hemroidectomy     INSERTION OF ILIAC STENT Bilateral 05/16/2017   Procedure: STENT OF BILATERAL COMMON AND EXTERNAL ILIAC VEIN WITH 18 X 90 WALL STENT;  Surgeon: Maeola Harman, MD;  Location: [facility] OR;  Service: Vascular;  Laterality: Bilateral;   IVC FILTER INSERTION N/A 05/16/2017   Procedure: MECHANICAL THROMBECTOMY OF IVC FILTER;  Surgeon: Maeola Harman, MD;  Location: [facility] OR;  Service: Vascular;  Laterality: N/A;   LAMINECTOMY     LOWER EXTREMITY VENOGRAPHY N/A 05/14/2017   Procedure: Lower Extremity Venography;  Surgeon: Nada Libman, MD;  [facility]: [facility];  Service: Cardiovascular;  Laterality: N/A;   NASAL SINUS SURGERY     PERCUTANEOUS VENOUS THROMBECTOMY,LYSIS WITH INTRAVASCULAR ULTRASOUND (IVUS) N/A 05/16/2017   Procedure: PERCUTANEOUS VENOUS THROMBECTOMY AND LYSIS WITH INTRAVASCULAR ULTRASOUND (IVUS);  Surgeon: Maeola Harman, MD;  Location: [facility] OR;  Service: Vascular;  Laterality: N/A;   VENA CAVA FILTER PLACEMENT N/A 05/16/2017   Procedure: RETRIEVAL INFERIOR VENA-CAVA FILTER;  Surgeon: Maeola Harman, MD;  Location: [facility] OR;  Service: Vascular;  Laterality: N/A;   VENOGRAM Bilateral 05/16/2017   Procedure: VENOGRAM OF IVC;  Surgeon: Maeola Harman, MD;  Location: [facility] OR;  Service: Vascular;  Laterality: Bilateral;   Past Medical History:  Diagnosis Date   Allergy    Anemia    Arthritis    left knee   Back pain    Bipolar disorder (HCC)    Chronic headaches    COPD (chronic obstructive pulmonary disease) (HCC)    Degenerative disorder of bone    Depression    Esophageal ulcer    history   GERD (gastroesophageal reflux disease)    Hyperlipidemia    Myeloma (HCC)    Peripheral vascular disease (HCC)    Pulmonary embolism (HCC)    Scoliosis    BP 122/76   Pulse 83   Ht 4\' 9"  (1.448 m)   Wt 130 lb (59 kg)   SpO2 92%   BMI 28.13 kg/m   Opioid  Risk Score:   Fall Risk Score:  `1  Depression screen PHQ 2/9     09/02/2023    1:26 PM 07/12/2023    9:05 AM 05/14/2023    9:54 AM 01/28/2023    1:45 PM 12/12/2022    1:42 PM 11/01/2022    1:37 PM 08/06/2022    1:20 PM  Depression screen PHQ 2/9  Decreased Interest 0 1 1 1  0 1 1  Down, Depressed, Hopeless 0 1 1 1  0 1 1  PHQ - 2 Score 0 2 2 2  0 2 2     Review of Systems  Musculoskeletal:        Right shoulder pain Bilateral knee pain Left upper leg numbness, pin/needles       Objective:   Physical Exam        Assessment & Plan:  1.  History of osteoporosis with multiple thoracic and lumbar compression fractures as well as bilateral rib fractures, patient is no longer symptomatic at these levels. We will continue to Monitor. 09/02/2023  2.  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 Continue to Monitor. 09/02/2023  3. Bilateral Knee Pain/ Left Knee knee pain: S/P Fall : Hospitalized  at [facility] on 05/25/2022 For: 1.  Acute nondisplaced left patellar fracture with associated hemarthrosis: 2/2 to fall. Orthopedics recommended left knee immobilizer, WBAT, Has Therapy weekly.  Orthopedics following. No falls this month. Continue to monitor. Continue HEP as Tolerated.  Continue to Monitor. 09/02/2023 4.  Chronic pain syndrome multifactorial continue hydrocodone 10 mg /325 1 tablet three a day as needed for pain #75.Marland KitchenSecond script sent for the following month. We will continue the opioid monitoring program, this consists of regular clinic visits, examinations, urine drug screen, pill counts as well as use of [place] Controlled Substance Reporting system. A 12 month History has been reviewed on the [place] Controlled Substance Reporting System on 09/02/2023   5. Lumbosacral Spondylosis without Myelopathy: Continue HEP as Tolerated. Continue current medication regimen. Continue to Monitor. 09/02/2023 6. Chronic Right  Shoulder Pain: Continue HEP as Tolerated. Continue to monitor. 09/02/2023 7. Chronic Left Hip Pain:  No complaints today. Continue HEP as tolerated. Continue current medication regimen. Continue to monitor. 09/02/2023 8. Meralgia Paresthesia of Left Lower extremity: Continue current medication regimen. Continue to Monitor.    F/U in 2 months

## 2023-10-30 ENCOUNTER — Encounter: Payer: 59 | Attending: Physical Medicine & Rehabilitation | Admitting: Registered Nurse

## 2023-10-30 VITALS — BP 122/76 | HR 83 | Ht <= 58 in | Wt 130.0 lb

## 2023-10-30 DIAGNOSIS — M25562 Pain in left knee: Secondary | ICD-10-CM | POA: Diagnosis present

## 2023-10-30 DIAGNOSIS — G894 Chronic pain syndrome: Secondary | ICD-10-CM | POA: Diagnosis present

## 2023-10-30 DIAGNOSIS — G5712 Meralgia paresthetica, left lower limb: Secondary | ICD-10-CM | POA: Diagnosis present

## 2023-10-30 DIAGNOSIS — G8929 Other chronic pain: Secondary | ICD-10-CM | POA: Diagnosis present

## 2023-10-30 DIAGNOSIS — M25511 Pain in right shoulder: Secondary | ICD-10-CM | POA: Insufficient documentation

## 2023-10-30 DIAGNOSIS — M47817 Spondylosis without myelopathy or radiculopathy, lumbosacral region: Secondary | ICD-10-CM | POA: Diagnosis present

## 2023-10-30 DIAGNOSIS — M25561 Pain in right knee: Secondary | ICD-10-CM | POA: Insufficient documentation

## 2023-10-30 DIAGNOSIS — Z5181 Encounter for therapeutic drug level monitoring: Secondary | ICD-10-CM | POA: Diagnosis not present

## 2023-10-30 DIAGNOSIS — Z79891 Long term (current) use of opiate analgesic: Secondary | ICD-10-CM | POA: Diagnosis present

## 2023-10-30 MED ORDER — HYDROCODONE-ACETAMINOPHEN 10-325 MG PO TABS: 1.0000 | ORAL_TABLET | Freq: Three times a day (TID) | ORAL | 0 refills | Status: DC | PRN

## 2023-10-31 ENCOUNTER — Encounter: Payer: Self-pay | Admitting: Registered Nurse

## 2023-11-03 LAB — DRUG TOX ALC METAB W/CON, ORAL FLD: Alcohol Metabolite: NEGATIVE ng/mL (ref <25)

## 2023-11-03 LAB — DRUG TOX MONITOR 1 W/CONF, ORAL FLD
Alprazolam: NEGATIVE ng/mL (ref <0.50)
Aminoclonazepam: NEGATIVE ng/mL (ref <0.50)
Amphetamines: NEGATIVE ng/mL (ref <10)
Barbiturates: NEGATIVE ng/mL (ref <10)
Benzodiazepines: POSITIVE ng/mL — AB (ref <0.50)
Buprenorphine: NEGATIVE ng/mL (ref <0.10)
Chlordiazepoxide: NEGATIVE ng/mL (ref <0.50)
Clonazepam: NEGATIVE ng/mL (ref <0.50)
Cocaine: NEGATIVE ng/mL (ref <5.0)
Codeine: NEGATIVE ng/mL (ref <2.5)
Diazepam: NEGATIVE ng/mL (ref <0.50)
Dihydrocodeine: 3.2 ng/mL — ABNORMAL HIGH (ref <2.5)
Fentanyl: NEGATIVE ng/mL (ref <0.10)
Flunitrazepam: NEGATIVE ng/mL (ref <0.50)
Flurazepam: NEGATIVE ng/mL (ref <0.50)
Heroin Metabolite: NEGATIVE ng/mL (ref <1.0)
Hydrocodone: 17.9 ng/mL — ABNORMAL HIGH (ref <2.5)
Hydromorphone: NEGATIVE ng/mL (ref <2.5)
Lorazepam: 3.23 ng/mL — ABNORMAL HIGH (ref <0.50)
MARIJUANA: NEGATIVE ng/mL (ref <2.5)
MDMA: NEGATIVE ng/mL (ref <10)
Meprobamate: NEGATIVE ng/mL (ref <2.5)
Methadone: NEGATIVE ng/mL (ref <5.0)
Midazolam: NEGATIVE ng/mL (ref <0.50)
Morphine: NEGATIVE ng/mL (ref <2.5)
Nicotine Metabolite: NEGATIVE ng/mL (ref <5.0)
Nordiazepam: NEGATIVE ng/mL (ref <0.50)
Norhydrocodone: 4.4 ng/mL — ABNORMAL HIGH (ref <2.5)
Noroxycodone: NEGATIVE ng/mL (ref <2.5)
Opiates: POSITIVE ng/mL — AB (ref <2.5)
Oxazepam: NEGATIVE ng/mL (ref <0.50)
Oxycodone: NEGATIVE ng/mL (ref <2.5)
Oxymorphone: NEGATIVE ng/mL (ref <2.5)
Phencyclidine: NEGATIVE ng/mL (ref <10)
Tapentadol: NEGATIVE ng/mL (ref <5.0)
Temazepam: NEGATIVE ng/mL (ref <0.50)
Tramadol: NEGATIVE ng/mL (ref <5.0)
Triazolam: NEGATIVE ng/mL (ref <0.50)
Zolpidem: NEGATIVE ng/mL (ref <5.0)

## 2023-12-31 ENCOUNTER — Encounter: Payer: 59 | Attending: Physical Medicine & Rehabilitation | Admitting: Registered Nurse

## 2023-12-31 VITALS — BP 128/67 | HR 73 | Ht <= 58 in | Wt 130.0 lb

## 2023-12-31 DIAGNOSIS — M546 Pain in thoracic spine: Secondary | ICD-10-CM | POA: Diagnosis not present

## 2023-12-31 DIAGNOSIS — G894 Chronic pain syndrome: Secondary | ICD-10-CM | POA: Diagnosis not present

## 2023-12-31 DIAGNOSIS — M47817 Spondylosis without myelopathy or radiculopathy, lumbosacral region: Secondary | ICD-10-CM | POA: Diagnosis not present

## 2023-12-31 DIAGNOSIS — Z79891 Long term (current) use of opiate analgesic: Secondary | ICD-10-CM | POA: Diagnosis present

## 2023-12-31 DIAGNOSIS — G8929 Other chronic pain: Secondary | ICD-10-CM | POA: Insufficient documentation

## 2023-12-31 DIAGNOSIS — M25511 Pain in right shoulder: Secondary | ICD-10-CM | POA: Insufficient documentation

## 2023-12-31 DIAGNOSIS — Z5181 Encounter for therapeutic drug level monitoring: Secondary | ICD-10-CM | POA: Insufficient documentation

## 2023-12-31 MED ORDER — HYDROCODONE-ACETAMINOPHEN 10-325 MG PO TABS: 1.0000 | ORAL_TABLET | Freq: Three times a day (TID) | ORAL | 0 refills | Status: DC | PRN

## 2023-12-31 NOTE — Progress Notes (Unsigned)
Subjective:    Patient ID: [name] May, female    DOB: 13-Feb-1941, 83 y.o.   MRN: [id number]  [name]:[id number] H May is a 83 y.o. female who returns for follow up appointment for chronic pain and medication refill. states *** pain is located in  ***. rates pain ***. current exercise regime is walking and performing stretching exercises.   Pain Inventory Average Pain 6 Pain Right Now 6 My pain is constant, burning, and aching  In the last 24 hours, has pain interfered with the following? General activity 3 Relation with others 3 Enjoyment of life 5 What TIME of day is your pain at its worst? evening and night Sleep (in general) Fair  Pain is worse with: standing Pain improves with: medication Relief from Meds: 8  Family History  Problem Relation Age of Onset   Deep vein thrombosis Neg Hx    Social History   Socioeconomic History   Marital status: Widowed    Spouse name: Not on file   Number of children: Not on file   Years of education: Not on file   Highest education level: Not on file  Occupational History   Not on file  Tobacco Use   Smoking status: Former   Smokeless tobacco: Never   Tobacco comments:    quit 25 years ago   Vaping Use   Vaping status: Never Used  Substance and Sexual Activity   Alcohol use: No   Drug use: No   Sexual activity: Not Currently  Other Topics Concern   Not on file  Social History Narrative   Not on file   Social Drivers of Health   Financial Resource Strain: Not on file  Food Insecurity: Low Risk  (07/09/2023)   Received from [facility]   Hunger Vital Sign    Worried About Running Out of Food in the Last Year: Never true    Ran Out of Food in the Last Year: Never true  Transportation Needs: No Transportation Needs (07/09/2023)   Received from [facility]    In the past 12 months, has lack of reliable transportation kept you from medical appointments, meetings, work or from getting things needed for  daily living? : No  Physical Activity: Not on file  Stress: Not on file  Social Connections: Unknown (05/27/2023)   Received from [facility]   Social Network    Social Network: Not on file   Past Surgical History:  Procedure Laterality Date   ABDOMINAL HYSTERECTOMY     ANKLE SURGERY     COLONOSCOPY     CORONARY ULTRASOUND/IVUS N/A 05/06/2017   Procedure: INTRAVASCULAR ULTRASOUND/IVUS;  Surgeon: Maeola Harman, MD;  Location: [facility] OR;  Service: Vascular;  Laterality: N/A;   EYE SURGERY     caratacs   FEMORAL ARTERY EXPLORATION  05/16/2017   Procedure: STENT OF BILATERAL COMMON FEMORAL VEIN WITH 16 X 90 WALL STENT;  Surgeon: Maeola Harman, MD;  Location: [facility] OR;  Service: Vascular;;   hemroidectomy     INSERTION OF ILIAC STENT Bilateral 05/16/2017   Procedure: STENT OF BILATERAL COMMON AND EXTERNAL ILIAC VEIN WITH 18 X 90 WALL STENT;  Surgeon: Maeola Harman, MD;  Location: [facility] OR;  Service: Vascular;  Laterality: Bilateral;   IVC FILTER INSERTION N/A 05/16/2017   Procedure: MECHANICAL THROMBECTOMY OF IVC FILTER;  Surgeon: Maeola Harman, MD;  Location: [facility] OR;  Service: Vascular;  Laterality: N/A;   LAMINECTOMY  LOWER EXTREMITY VENOGRAPHY N/A 05/14/2017   Procedure: Lower Extremity Venography;  Surgeon: Nada Libman, MD;  Location: [facility];  Service: Cardiovascular;  Laterality: N/A;   NASAL SINUS SURGERY     PERCUTANEOUS VENOUS THROMBECTOMY,LYSIS WITH INTRAVASCULAR ULTRASOUND (IVUS) N/A 05/16/2017   Procedure: PERCUTANEOUS VENOUS THROMBECTOMY AND LYSIS WITH INTRAVASCULAR ULTRASOUND (IVUS);  Surgeon: Maeola Harman, MD;  Location: [facility] OR;  Service: Vascular;  Laterality: N/A;   VENA CAVA FILTER PLACEMENT N/A 05/16/2017   Procedure: RETRIEVAL INFERIOR VENA-CAVA FILTER;  Surgeon: Maeola Harman, MD;  Location: [facility] OR;  Service: Vascular;  Laterality: N/A;   VENOGRAM Bilateral 05/16/2017   Procedure: VENOGRAM OF IVC;   Surgeon: Maeola Harman, MD;  Location: [facility] OR;  Service: Vascular;  Laterality: Bilateral;   Past Surgical History:  Procedure Laterality Date   ABDOMINAL HYSTERECTOMY     ANKLE SURGERY     COLONOSCOPY     CORONARY ULTRASOUND/IVUS N/A 05/06/2017   Procedure: INTRAVASCULAR ULTRASOUND/IVUS;  Surgeon: Maeola Harman, MD;  Location: [facility] OR;  Service: Vascular;  Laterality: N/A;   EYE SURGERY     caratacs   FEMORAL ARTERY EXPLORATION  05/16/2017   Procedure: STENT OF BILATERAL COMMON FEMORAL VEIN WITH 16 X 90 WALL STENT;  Surgeon: Maeola Harman, MD;  Location: [facility] OR;  Service: Vascular;;   hemroidectomy     INSERTION OF ILIAC STENT Bilateral 05/16/2017   Procedure: STENT OF BILATERAL COMMON AND EXTERNAL ILIAC VEIN WITH 18 X 90 WALL STENT;  Surgeon: Maeola Harman, MD;  Location: [facility] OR;  Service: Vascular;  Laterality: Bilateral;   IVC FILTER INSERTION N/A 05/16/2017   Procedure: MECHANICAL THROMBECTOMY OF IVC FILTER;  Surgeon: Maeola Harman, MD;  Location: [facility] OR;  Service: Vascular;  Laterality: N/A;   LAMINECTOMY     LOWER EXTREMITY VENOGRAPHY N/A 05/14/2017   Procedure: Lower Extremity Venography;  Surgeon: Nada Libman, MD;  Location: [facility];  Service: Cardiovascular;  Laterality: N/A;   NASAL SINUS SURGERY     PERCUTANEOUS VENOUS THROMBECTOMY,LYSIS WITH INTRAVASCULAR ULTRASOUND (IVUS) N/A 05/16/2017   Procedure: PERCUTANEOUS VENOUS THROMBECTOMY AND LYSIS WITH INTRAVASCULAR ULTRASOUND (IVUS);  Surgeon: Maeola Harman, MD;  Location: [facility] OR;  Service: Vascular;  Laterality: N/A;   VENA CAVA FILTER PLACEMENT N/A 05/16/2017   Procedure: RETRIEVAL INFERIOR VENA-CAVA FILTER;  Surgeon: Maeola Harman, MD;  Location: [facility] OR;  Service: Vascular;  Laterality: N/A;   VENOGRAM Bilateral 05/16/2017   Procedure: VENOGRAM OF IVC;  Surgeon: Maeola Harman, MD;  Location: [facility] OR;  Service: Vascular;  Laterality:  Bilateral;   Past Medical History:  Diagnosis Date   Allergy    Anemia    Arthritis    left knee   Back pain    Bipolar disorder (HCC)    Chronic headaches    COPD (chronic obstructive pulmonary disease) (HCC)    Degenerative disorder of bone    Depression    Esophageal ulcer    history   GERD (gastroesophageal reflux disease)    Hyperlipidemia    Myeloma (HCC)    Peripheral vascular disease (HCC)    Pulmonary embolism (HCC)    Scoliosis    BP 128/67   Pulse 73   Ht 4\' 9"  (1.448 m)   Wt 130 lb (59 kg)   SpO2 93%   BMI 28.13 kg/m   Opioid Risk Score:   Fall Risk Score:  `1  Depression screen [facility] 2/9  09/02/2023    1:26 PM 07/12/2023    9:05 AM 05/14/2023    9:54 AM 01/28/2023    1:45 PM 12/12/2022    1:42 PM 11/01/2022    1:37 PM 08/06/2022    1:20 PM  Depression screen PHQ 2/9  Decreased Interest 0 1 1 1  0 1 1  Down, Depressed, Hopeless 0 1 1 1  0 1 1  PHQ - 2 Score 0 2 2 2  0 2 2      Review of Systems  Musculoskeletal:  Positive for back pain and gait problem.       Right Shoulder, Left Thigh, B/L knee pain   All other systems reviewed and are negative.     Objective:   Physical Exam        Assessment & Plan:

## 2024-01-02 ENCOUNTER — Encounter: Payer: Self-pay | Admitting: Registered Nurse

## 2024-03-02 ENCOUNTER — Encounter: Payer: 59 | Admitting: Registered Nurse

## 2024-03-10 ENCOUNTER — Encounter: Payer: Self-pay | Admitting: Registered Nurse

## 2024-03-10 ENCOUNTER — Encounter: Attending: Physical Medicine & Rehabilitation | Admitting: Registered Nurse

## 2024-03-10 VITALS — BP 114/71 | HR 69 | Ht <= 58 in

## 2024-03-10 DIAGNOSIS — G8929 Other chronic pain: Secondary | ICD-10-CM | POA: Insufficient documentation

## 2024-03-10 DIAGNOSIS — M25511 Pain in right shoulder: Secondary | ICD-10-CM | POA: Insufficient documentation

## 2024-03-10 DIAGNOSIS — M25561 Pain in right knee: Secondary | ICD-10-CM | POA: Insufficient documentation

## 2024-03-10 DIAGNOSIS — M5416 Radiculopathy, lumbar region: Secondary | ICD-10-CM | POA: Diagnosis present

## 2024-03-10 DIAGNOSIS — M546 Pain in thoracic spine: Secondary | ICD-10-CM | POA: Diagnosis present

## 2024-03-10 DIAGNOSIS — M25562 Pain in left knee: Secondary | ICD-10-CM | POA: Diagnosis present

## 2024-03-10 DIAGNOSIS — G894 Chronic pain syndrome: Secondary | ICD-10-CM | POA: Diagnosis present

## 2024-03-10 DIAGNOSIS — Z5181 Encounter for therapeutic drug level monitoring: Secondary | ICD-10-CM | POA: Insufficient documentation

## 2024-03-10 DIAGNOSIS — M47817 Spondylosis without myelopathy or radiculopathy, lumbosacral region: Secondary | ICD-10-CM | POA: Diagnosis present

## 2024-03-10 DIAGNOSIS — Z79891 Long term (current) use of opiate analgesic: Secondary | ICD-10-CM | POA: Insufficient documentation

## 2024-03-10 NOTE — Progress Notes (Signed)
 Subjective:    Patient ID: [name] May, female    DOB: [birth year], 83 y.o.   MRN: [id number]  HPI: [name] May is a 83 y.o. female who returns for follow up appointment for chronic pain and medication refill. She states her pain is located in her right shoulder, mid-lower back. Lower back pain radiating into his left lower extremity and bilateral knee pain L>R. She rates her pain 5. Her current exercise regime is walking short distances with cane.   Ms. Serres Morphine  equivalent is 30.00 MME.   Oral swab was performed today.     Pain Inventory Average Pain 6 Pain Right Now 5 My pain is dull and tingling  In the last 24 hours, has pain interfered with the following? General activity 7 Relation with others 0 Enjoyment of life 0 What TIME of day is your pain at its worst? evening Sleep (in general) Good  Pain is worse with: bending, standing, and getting up Pain improves with: rest, pacing activities, and medication Relief from Meds: 7  Family History  Problem Relation Age of Onset   Deep vein thrombosis Neg Hx    Social History   Socioeconomic History   Marital status: Widowed    Spouse name: Not on file   Number of children: Not on file   Years of education: Not on file   Highest education level: Not on file  Occupational History   Not on file  Tobacco Use   Smoking status: Former   Smokeless tobacco: Never   Tobacco comments:    quit 25 years ago   Vaping Use   Vaping status: Never Used  Substance and Sexual Activity   Alcohol use: No   Drug use: No   Sexual activity: Not Currently  Other Topics Concern   Not on file  Social History Narrative   Not on file   Social Drivers of Health   Financial Resource Strain: Not on file  Food Insecurity: Low Risk  (07/09/2023)   Received from [facility]   Hunger Vital Sign    Worried About Running Out of Food in the Last Year: Never true    Ran Out of Food in the Last Year: Never true  Transportation  Needs: No Transportation Needs (07/09/2023)   Received from [employer]    In the past 12 months, has lack of reliable transportation kept you from medical appointments, meetings, work or from getting things needed for daily living? : No  Physical Activity: Not on file  Stress: Not on file  Social Connections: Unknown (05/27/2023)   [facility]   Social Network    Social Network: Not on file   Past Surgical History:  Procedure Laterality Date   ABDOMINAL HYSTERECTOMY     ANKLE SURGERY     COLONOSCOPY     CORONARY ULTRASOUND/IVUS N/A 05/06/2017   Procedure: INTRAVASCULAR ULTRASOUND/IVUS;  Surgeon: Adine Hoof, MD;  Location: [facility] OR;  Service: Vascular;  Laterality: N/A;   EYE SURGERY     caratacs   FEMORAL ARTERY EXPLORATION  05/16/2017   Procedure: STENT OF BILATERAL COMMON FEMORAL VEIN WITH 16 X 90 WALL STENT;  Surgeon: Adine Hoof, MD;  Location: [facility] OR;  Service: Vascular;;   hemroidectomy     INSERTION OF ILIAC STENT Bilateral 05/16/2017   Procedure: STENT OF BILATERAL COMMON AND EXTERNAL ILIAC VEIN WITH 18 X 90 WALL STENT;  Surgeon: Adine Hoof, MD;  Location: [facility]  OR;  Service: Vascular;  Laterality: Bilateral;   IVC FILTER INSERTION N/A 05/16/2017   Procedure: MECHANICAL THROMBECTOMY OF IVC FILTER;  Surgeon: Adine Hoof, MD;  Location: [facility] OR;  Service: Vascular;  Laterality: N/A;   LAMINECTOMY     LOWER EXTREMITY VENOGRAPHY N/A 05/14/2017   Procedure: Lower Extremity Venography;  Surgeon: Margherita Shell, MD;  [facility];  Service: Cardiovascular;  Laterality: N/A;   NASAL SINUS SURGERY     PERCUTANEOUS VENOUS THROMBECTOMY,LYSIS WITH INTRAVASCULAR ULTRASOUND (IVUS) N/A 05/16/2017   Procedure: PERCUTANEOUS VENOUS THROMBECTOMY AND LYSIS WITH INTRAVASCULAR ULTRASOUND (IVUS);  Surgeon: Adine Hoof, MD;  Location[facility] OR;  Service: Vascular;  Laterality: N/A;   VENA CAVA FILTER  PLACEMENT N/A 05/16/2017   Procedure: RETRIEVAL INFERIOR VENA-CAVA FILTER;  Surgeon: Adine Hoof, MD;  Location: [facility] OR;  Service: Vascular;  Laterality: N/A;   VENOGRAM Bilateral 05/16/2017   Procedure: VENOGRAM OF IVC;  Surgeon: Adine Hoof, MD;  Location: [facility] OR;  Service: Vascular;  Laterality: Bilateral;   Past Surgical History:  Procedure Laterality Date   ABDOMINAL HYSTERECTOMY     ANKLE SURGERY     COLONOSCOPY     CORONARY ULTRASOUND/IVUS N/A 05/06/2017   Procedure: INTRAVASCULAR ULTRASOUND/IVUS;  Surgeon: Adine Hoof, MD;  Location: [facility] OR;  Service: Vascular;  Laterality: N/A;   EYE SURGERY     caratacs   FEMORAL ARTERY EXPLORATION  05/16/2017   Procedure: STENT OF BILATERAL COMMON FEMORAL VEIN WITH 16 X 90 WALL STENT;  Surgeon: Adine Hoof, MD;  Location: [facility] OR;  Service: Vascular;;   hemroidectomy     INSERTION OF ILIAC STENT Bilateral 05/16/2017   Procedure: STENT OF BILATERAL COMMON AND EXTERNAL ILIAC VEIN WITH 18 X 90 WALL STENT;  Surgeon: Adine Hoof, MD;  Location: [facility] OR;  Service: Vascular;  Laterality: Bilateral;   IVC FILTER INSERTION N/A 05/16/2017   Procedure: MECHANICAL THROMBECTOMY OF IVC FILTER;  Surgeon: Adine Hoof, MD;  Location: [facility] OR;  Service: Vascular;  Laterality: N/A;   LAMINECTOMY     LOWER EXTREMITY VENOGRAPHY N/A 05/14/2017   Procedure: Lower Extremity Venography;  Surgeon: Margherita Shell, MD;  [facility];  Service: Cardiovascular;  Laterality: N/A;   NASAL SINUS SURGERY     PERCUTANEOUS VENOUS THROMBECTOMY,LYSIS WITH INTRAVASCULAR ULTRASOUND (IVUS) N/A 05/16/2017   Procedure: PERCUTANEOUS VENOUS THROMBECTOMY AND LYSIS WITH INTRAVASCULAR ULTRASOUND (IVUS);  Surgeon: Adine Hoof, MD;  Location: [facility] OR;  Service: Vascular;  Laterality: N/A;   VENA CAVA FILTER PLACEMENT N/A 05/16/2017   Procedure: RETRIEVAL INFERIOR VENA-CAVA FILTER;  Surgeon: Adine Hoof, MD;  Location: [facility] OR;  Service: Vascular;  Laterality: N/A;   VENOGRAM Bilateral 05/16/2017   Procedure: VENOGRAM OF IVC;  Surgeon: Adine Hoof, MD;  Location: [facility] OR;  Service: Vascular;  Laterality: Bilateral;   Past Medical History:  Diagnosis Date   Allergy    Anemia    Arthritis    left knee   Back pain    Bipolar disorder (HCC)    Chronic headaches    COPD (chronic obstructive pulmonary disease) (HCC)    Degenerative disorder of bone    Depression    Esophageal ulcer    history   GERD (gastroesophageal reflux disease)    Hyperlipidemia    Myeloma (HCC)    Peripheral vascular disease (HCC)    Pulmonary embolism (HCC)    Scoliosis    BP 114/71  Pulse 69   Ht 4\' 9"  (1.448 m)   SpO2 91%   BMI 28.13 kg/m   Opioid Risk Score:   Fall Risk Score:  `1  Depression screen [facility] 2/9     03/10/2024   11:21 AM 09/02/2023    1:26 PM 07/12/2023    9:05 AM 05/14/2023    9:54 AM 01/28/2023    1:45 PM 12/12/2022    1:42 PM 11/01/2022    1:37 PM  Depression screen PHQ 2/9  Decreased Interest 1 0 1 1 1  0 1  Down, Depressed, Hopeless 1 0 1 1 1  0 1  PHQ - 2 Score 2 0 2 2 2  0 2     Review of Systems  Musculoskeletal:  Positive for back pain.       Right and left arms left leg  Psychiatric/Behavioral:  Positive for dysphoric mood.   All other systems reviewed and are negative.      Objective:   Physical Exam Vitals and nursing note reviewed.  Constitutional:      Appearance: Normal appearance.  Cardiovascular:     Rate and Rhythm: Normal rate and regular rhythm.     Pulses: Normal pulses.     Heart sounds: Normal heart sounds.  Pulmonary:     Effort: Pulmonary effort is normal.     Breath sounds: Normal breath sounds.  Musculoskeletal:     Comments: Normal Muscle Bulk and Muscle Testing Reveals:  Upper Extremities: Right: Decreased ROM 30 Degrees  and Muscle Strength 5/5 Left upper Extremity: Decreased ROM 90 Degrees and Muscle Strength  5/5 Bilateral AC  Joint Tenderness Lower Extremities: Full ROM and Muscle Strength 5/5 Arises from Table Slowly using cane for support  Narrow Based  Gait     Skin:    General: Skin is warm and dry.  Neurological:     Mental Status: She is alert and oriented to person, place, and time.  Psychiatric:        Mood and Affect: Mood normal.        Behavior: Behavior normal.         Assessment & Plan:  1.  History of osteoporosis with multiple thoracic and lumbar compression fractures as well as bilateral rib fractures, patient is no longer symptomatic at these levels. We will continue to Monitor. 03/10/2024  2.  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 Continue to Monitor. 03/10/2024  3. Bilateral Knee Pain/ Left Knee knee pain: S/P Fall : Hospitalized  at [facility] on 05/25/2022 For: 1.  Acute nondisplaced left patellar fracture with associated hemarthrosis: 2/2 to fall. Orthopedics recommended left knee immobilizer, WBAT, Has Therapy weekly.  Orthopedics following. No falls this month. Continue to monitor. Continue HEP as Tolerated. Continue to Monitor. 03/10/2024 4.  Chronic pain syndrome multifactorial continue hydrocodone  10 mg /325 1 tablet three a day as needed for pain #75.. We will continue the opioid monitoring program, this consists of regular clinic visits, examinations, urine drug screen, pill counts as well as use of [place]  Controlled Substance Reporting system. A 12 month History has been reviewed on the [place]  Controlled Substance Reporting System on 03/10/2024   5. Lumbosacral Spondylosis without Myelopathy: Continue HEP as Tolerated. Continue current medication regimen. Continue to Monitor. 03/10/2024 6. Chronic Right  Shoulder Pain: Continue HEP as Tolerated. Continue to monitor. 03/10/2024 7. Chronic Left Hip Pain:  No complaints today. Continue HEP as  tolerated. Continue  current medication regimen. Continue to monitor. 03/10/2024 8. Meralgia Paresthesia of Left Lower extremity: No complaints today. Continue current medication regimen. Continue to Monitor. 03/10/2024   F/U in 2 months

## 2024-03-15 LAB — DRUG TOX MONITOR 1 W/CONF, ORAL FLD
Alprazolam: NEGATIVE ng/mL (ref <0.50)
Aminoclonazepam: NEGATIVE ng/mL (ref <0.50)
Amphetamines: NEGATIVE ng/mL (ref <10)
Barbiturates: NEGATIVE ng/mL (ref <10)
Benzodiazepines: POSITIVE ng/mL — AB (ref <0.50)
Buprenorphine: NEGATIVE ng/mL (ref <0.10)
Chlordiazepoxide: NEGATIVE ng/mL (ref <0.50)
Clonazepam: NEGATIVE ng/mL (ref <0.50)
Cocaine: NEGATIVE ng/mL (ref <5.0)
Codeine: NEGATIVE ng/mL (ref <2.5)
Diazepam: NEGATIVE ng/mL (ref <0.50)
Dihydrocodeine: NEGATIVE ng/mL (ref <2.5)
Fentanyl: NEGATIVE ng/mL (ref <0.10)
Flunitrazepam: NEGATIVE ng/mL (ref <0.50)
Flurazepam: NEGATIVE ng/mL (ref <0.50)
Heroin Metabolite: NEGATIVE ng/mL (ref <1.0)
Hydrocodone: 10 ng/mL — ABNORMAL HIGH (ref <2.5)
Hydromorphone: NEGATIVE ng/mL (ref <2.5)
Lorazepam: 7.25 ng/mL — ABNORMAL HIGH (ref <0.50)
MARIJUANA: NEGATIVE ng/mL (ref <2.5)
MDMA: NEGATIVE ng/mL (ref <10)
Meprobamate: NEGATIVE ng/mL (ref <2.5)
Methadone: NEGATIVE ng/mL (ref <5.0)
Midazolam: NEGATIVE ng/mL (ref <0.50)
Morphine: NEGATIVE ng/mL (ref <2.5)
Nicotine Metabolite: NEGATIVE ng/mL (ref <5.0)
Nordiazepam: NEGATIVE ng/mL (ref <0.50)
Norhydrocodone: 2.6 ng/mL — ABNORMAL HIGH (ref <2.5)
Noroxycodone: NEGATIVE ng/mL (ref <2.5)
Opiates: POSITIVE ng/mL — AB (ref <2.5)
Oxazepam: NEGATIVE ng/mL (ref <0.50)
Oxycodone: NEGATIVE ng/mL (ref <2.5)
Oxymorphone: NEGATIVE ng/mL (ref <2.5)
Phencyclidine: NEGATIVE ng/mL (ref <10)
Tapentadol: NEGATIVE ng/mL (ref <5.0)
Temazepam: NEGATIVE ng/mL (ref <0.50)
Tramadol: NEGATIVE ng/mL (ref <5.0)
Triazolam: NEGATIVE ng/mL (ref <0.50)
Zolpidem: NEGATIVE ng/mL (ref <5.0)

## 2024-03-15 LAB — DRUG TOX ALC METAB W/CON, ORAL FLD: Alcohol Metabolite: NEGATIVE ng/mL (ref <25)

## 2024-05-04 ENCOUNTER — Encounter: Attending: Physical Medicine & Rehabilitation | Admitting: Registered Nurse

## 2024-05-04 ENCOUNTER — Encounter: Payer: Self-pay | Admitting: Registered Nurse

## 2024-05-04 VITALS — BP 126/85 | HR 68 | Ht <= 58 in | Wt 130.0 lb

## 2024-05-04 DIAGNOSIS — G894 Chronic pain syndrome: Secondary | ICD-10-CM | POA: Diagnosis present

## 2024-05-04 DIAGNOSIS — G8929 Other chronic pain: Secondary | ICD-10-CM | POA: Insufficient documentation

## 2024-05-04 DIAGNOSIS — Z5181 Encounter for therapeutic drug level monitoring: Secondary | ICD-10-CM | POA: Insufficient documentation

## 2024-05-04 DIAGNOSIS — M47817 Spondylosis without myelopathy or radiculopathy, lumbosacral region: Secondary | ICD-10-CM | POA: Diagnosis present

## 2024-05-04 DIAGNOSIS — M25562 Pain in left knee: Secondary | ICD-10-CM | POA: Insufficient documentation

## 2024-05-04 DIAGNOSIS — M25511 Pain in right shoulder: Secondary | ICD-10-CM | POA: Diagnosis present

## 2024-05-04 DIAGNOSIS — Z79891 Long term (current) use of opiate analgesic: Secondary | ICD-10-CM | POA: Diagnosis present

## 2024-05-04 DIAGNOSIS — M25561 Pain in right knee: Secondary | ICD-10-CM | POA: Diagnosis present

## 2024-05-04 MED ORDER — HYDROCODONE-ACETAMINOPHEN 10-325 MG PO TABS: 1.0000 | ORAL_TABLET | Freq: Three times a day (TID) | ORAL | 0 refills | Status: DC | PRN

## 2024-05-04 NOTE — Progress Notes (Signed)
 Subjective:    Patient ID: [name] May, female    DOB: 1940/12/08, 83 y.o.   MRN: [id number]  HPI: [name] May is a 83 y.o. female who returns for follow up appointment for chronic pain and medication refill. She states her pain is located in her right shoulder, lower back and bilateral knee pain L>R. She rates her pain 6. Her current exercise regime is walking short distance with cane and performing stretching exercises.  [name] Morphine  equivalent is 30.00 MME. She  is also prescribed Lorazepam   by Dr. Tasia .We have discussed the black box warning of using opioids and benzodiazepines. I highlighted the dangers of using these drugs together and discussed the adverse events including respiratory suppression, overdose, cognitive impairment and importance of compliance with current regimen. We will continue to monitor and adjust as indicated.  she is being closely monitored and under the care of her psychiatrist.    Last Oral Swab was Performed on 03/10/2024, it was consistent.    Pain Inventory Average Pain 6 Pain Right Now 6 My pain is intermittent, constant, burning, and aching  In the last 24 hours, has pain interfered with the following? General activity 7 Relation with others 4 Enjoyment of life 4 What TIME of day is your pain at its worst? daytime and night Sleep (in general) Good   Pain is worse with: walking, inactivity, and standing Pain improves with: rest, medication, and heat Relief from Meds: good  Family History  Problem Relation Age of Onset   Deep vein thrombosis Neg Hx    Social History   Socioeconomic History   Marital status: Widowed    Spouse name: Not on file   Number of children: Not on file   Years of education: Not on file   Highest education level: Not on file  Occupational History   Not on file  Tobacco Use   Smoking status: Former   Smokeless tobacco: Never   Tobacco comments:    quit 25 years ago   Vaping Use   Vaping  status: Never Used  Substance and Sexual Activity   Alcohol use: No   Drug use: No   Sexual activity: Not Currently  Other Topics Concern   Not on file  Social History Narrative   Not on file   Social Drivers of Health   Financial Resource Strain: Not on file  Food Insecurity: Low Risk  (07/09/2023)   Received from [facility]   Hunger Vital Sign    Worried About Running Out of Food in the Last Year: Never true    Ran Out of Food in the Last Year: Never true  Transportation Needs: No Transportation Needs (07/09/2023)   Received from [employer]    In the past 12 months, has lack of reliable transportation kept you from medical appointments, meetings, work or from getting things needed for daily living? : No  Physical Activity: Not on file  Stress: Not on file  Social Connections: Unknown (05/27/2023)   Received from [facility]   Social Network    Social Network: Not on file   Past Surgical History:  Procedure Laterality Date   ABDOMINAL HYSTERECTOMY     ANKLE SURGERY     COLONOSCOPY     CORONARY ULTRASOUND/IVUS N/A 05/06/2017   Procedure: INTRAVASCULAR ULTRASOUND/IVUS;  Surgeon: Sheree Penne Bruckner, MD;  Location: [facility] OR;  Service: Vascular;  Laterality: N/A;   EYE SURGERY     caratacs  FEMORAL ARTERY EXPLORATION  05/16/2017   Procedure: STENT OF BILATERAL COMMON FEMORAL VEIN WITH 16 X 90 WALL STENT;  Surgeon: Sheree Penne Bruckner, MD;  Location: [facility] OR;  Service: Vascular;;   hemroidectomy     INSERTION OF ILIAC STENT Bilateral 05/16/2017   Procedure: STENT OF BILATERAL COMMON AND EXTERNAL ILIAC VEIN WITH 18 X 90 WALL STENT;  Surgeon: Sheree Penne Bruckner, MD;  Location: [facility] OR;  Service: Vascular;  Laterality: Bilateral;   IVC FILTER INSERTION N/A 05/16/2017   Procedure: MECHANICAL THROMBECTOMY OF IVC FILTER;  Surgeon: Sheree Penne Bruckner, MD;  Location: [facility] OR;  Service: Vascular;  Laterality: N/A;   LAMINECTOMY     LOWER EXTREMITY  VENOGRAPHY N/A 05/14/2017   Procedure: Lower Extremity Venography;  Surgeon: Serene Gaile ORN, MD;  [facility];  Service: Cardiovascular;  Laterality: N/A;   NASAL SINUS SURGERY     PERCUTANEOUS VENOUS THROMBECTOMY,LYSIS WITH INTRAVASCULAR ULTRASOUND (IVUS) N/A 05/16/2017   Procedure: PERCUTANEOUS VENOUS THROMBECTOMY AND LYSIS WITH INTRAVASCULAR ULTRASOUND (IVUS);  Surgeon: Sheree Penne Bruckner, MD;  Location: [facility] OR;  Service: Vascular;  Laterality: N/A;   VENA CAVA FILTER PLACEMENT N/A 05/16/2017   Procedure: RETRIEVAL INFERIOR VENA-CAVA FILTER;  Surgeon: Sheree Penne Bruckner, MD;  Location: [facility] OR;  Service: Vascular;  Laterality: N/A;   VENOGRAM Bilateral 05/16/2017   Procedure: VENOGRAM OF IVC;  Surgeon: Sheree Penne Bruckner, MD;  Location: [facility] OR;  Service: Vascular;  Laterality: Bilateral;   Past Surgical History:  Procedure Laterality Date   ABDOMINAL HYSTERECTOMY     ANKLE SURGERY     COLONOSCOPY     CORONARY ULTRASOUND/IVUS N/A 05/06/2017   Procedure: INTRAVASCULAR ULTRASOUND/IVUS;  Surgeon: Sheree Penne Bruckner, MD;  Location: [facility] OR;  Service: Vascular;  Laterality: N/A;   EYE SURGERY     caratacs   FEMORAL ARTERY EXPLORATION  05/16/2017   Procedure: STENT OF BILATERAL COMMON FEMORAL VEIN WITH 16 X 90 WALL STENT;  Surgeon: Sheree Penne Bruckner, MD;  Location: [facility] OR;  Service: Vascular;;   hemroidectomy     INSERTION OF ILIAC STENT Bilateral 05/16/2017   Procedure: STENT OF BILATERAL COMMON AND EXTERNAL ILIAC VEIN WITH 18 X 90 WALL STENT;  Surgeon: Sheree Penne Bruckner, MD;  Location: [facility] OR;  Service: Vascular;  Laterality: Bilateral;   IVC FILTER INSERTION N/A 05/16/2017   Procedure: MECHANICAL THROMBECTOMY OF IVC FILTER;  Surgeon: Sheree Penne Bruckner, MD;  Location: [facility] OR;  Service: Vascular;  Laterality: N/A;   LAMINECTOMY     LOWER EXTREMITY VENOGRAPHY N/A 05/14/2017   Procedure: Lower Extremity Venography;  Surgeon: Serene Gaile ORN, MD;   [facility];  Service: Cardiovascular;  Laterality: N/A;   NASAL SINUS SURGERY     PERCUTANEOUS VENOUS THROMBECTOMY,LYSIS WITH INTRAVASCULAR ULTRASOUND (IVUS) N/A 05/16/2017   Procedure: PERCUTANEOUS VENOUS THROMBECTOMY AND LYSIS WITH INTRAVASCULAR ULTRASOUND (IVUS);  Surgeon: Sheree Penne Bruckner, MD;  Location: [facility] OR;  Service: Vascular;  Laterality: N/A;   VENA CAVA FILTER PLACEMENT N/A 05/16/2017   Procedure: RETRIEVAL INFERIOR VENA-CAVA FILTER;  Surgeon: Sheree Penne Bruckner, MD;  Location: [facility] OR;  Service: Vascular;  Laterality: N/A;   VENOGRAM Bilateral 05/16/2017   Procedure: VENOGRAM OF IVC;  Surgeon: Sheree Penne Bruckner, MD;  Location: [facility] OR;  Service: Vascular;  Laterality: Bilateral;   Past Medical History:  Diagnosis Date   Allergy    Anemia    Arthritis    left knee   Back pain    Bipolar disorder (HCC)  Chronic headaches    COPD (chronic obstructive pulmonary disease) (HCC)    Degenerative disorder of bone    Depression    Esophageal ulcer    history   GERD (gastroesophageal reflux disease)    Hyperlipidemia    Myeloma (HCC)    Peripheral vascular disease (HCC)    Pulmonary embolism (HCC)    Scoliosis    There were no vitals taken for this visit.  Opioid Risk Score:   Fall Risk Score:  `1  Depression screen [facility] 2/9     03/10/2024   11:21 AM 09/02/2023    1:26 PM 07/12/2023    9:05 AM 05/14/2023    9:54 AM 01/28/2023    1:45 PM 12/12/2022    1:42 PM 11/01/2022    1:37 PM  Depression screen PHQ 2/9  Decreased Interest 1 0 1 1 1  0 1  Down, Depressed, Hopeless 1 0 1 1 1  0 1  PHQ - 2 Score 2 0 2 2 2  0 2    Review of Systems  Musculoskeletal:  Positive for back pain and gait problem.       Pain in both knees, right buttock pain  All other systems reviewed and are negative.      Objective:   Physical Exam Vitals and nursing note reviewed.  Constitutional:      Appearance: Normal appearance.  Cardiovascular:     Rate and  Rhythm: Normal rate and regular rhythm.     Pulses: Normal pulses.     Heart sounds: Normal heart sounds.  Pulmonary:     Effort: Pulmonary effort is normal.     Breath sounds: Normal breath sounds.  Musculoskeletal:     Comments: Normal Muscle Bulk and Muscle Testing Reveals:  Upper Extremities: Right: Decreased ROM 20 Degrees  and Muscle Strength 5/5 Right AC Joint Tenderness Left Upper Extremity: Decreased ROM 90 Degrees and Muscle Strength 5/5 Lumbar Paraspinal Tenderness: L-4-L-5 Lower Extremities: Decreased ROM and Muscle Strength 5/5 Bilateral Lower Extremities Flexion Produces Pain into her bilateral Patella's Arises from Table slowly using cane for support Narrow Based  Gait     Skin:    General: Skin is warm and dry.  Neurological:     Mental Status: She is alert and oriented to person, place, and time.  Psychiatric:        Mood and Affect: Mood normal.        Behavior: Behavior normal.           Assessment & Plan:  1.  History of osteoporosis with multiple thoracic and lumbar compression fractures as well as bilateral rib fractures, patient is no longer symptomatic at these levels. We will continue to Monitor. 05/04/2024  2.  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 Continue to Monitor. 05/04/2024  3. Bilateral Knee Pain/ Left Knee knee pain: S/P Fall : Hospitalized  at [facility] on 05/25/2022 For: 1.  Acute nondisplaced left patellar fracture with associated hemarthrosis: 2/2 to fall. Orthopedics recommended left knee immobilizer, WBAT, Has Therapy weekly.  Orthopedics following. No falls this month. Continue to monitor. Continue HEP as Tolerated. Continue to Monitor. 05/04/2024 4.  Chronic pain syndrome multifactorial continue hydrocodone  10 mg /325 1 tablet three a day as needed for pain #75.. We will continue the opioid monitoring program, this consists of regular clinic visits,  examinations, urine drug screen, pill counts as well as use of [place]  Controlled  Substance Reporting system. A 12 month History has been reviewed on the [place]  Controlled Substance Reporting System on 05/04/2024   5. Lumbosacral Spondylosis without Myelopathy: Continue HEP as Tolerated. Continue current medication regimen. Continue to Monitor. 05/04/2024 6. Chronic Right  Shoulder Pain: Continue HEP as Tolerated. Continue to monitor. 05/04/2024 7. Chronic Left Hip Pain:  No complaints today. Continue HEP as tolerated. Continue current medication regimen. Continue to monitor. 05/04/2024 8. Meralgia Paresthesia of Left Lower extremity: No complaints today. Continue current medication regimen. Continue to Monitor. 05/04/2024   F/U in 2 months

## 2024-07-03 NOTE — Progress Notes (Signed)
 Subjective:    Patient ID: [name] May, female    DOB: 11/18/1940, 83 y.o.   MRN: [id number]  HPI: [name] May is a 83 y.o. female who returns for follow up appointment for chronic pain and medication refill. She states her pain is located in her right shoulder, mid- lower back radiating into her left lower extremity and bilateral knee pain L>R . She rates her pain 4. current exercise regime is walking and performing stretching exercises.  [name] Morphine  equivalent is 30.00 MME. She is also prescribed Lorazepam  by Dr. Tasia .We have discussed the black box warning of using opioids and benzodiazepines. I highlighted the dangers of using these drugs together and discussed the adverse events including respiratory suppression, overdose, cognitive impairment and importance of compliance with current regimen. We will continue to monitor and adjust as indicated.  she is being closely monitored and under the care of her psychiatrist.   Oral Swab was Performed today.      Pain Inventory Average Pain 4 Pain Right Now 4 My pain is intermittent, constant, and tingling  In the last 24 hours, has pain interfered with the following? General activity moderately - a lot Relation with others 0 Enjoyment of life 0 What TIME of day is your pain at its worst? daytime and evening Sleep (in general) good (hard time getting to sleep)  Pain is worse with: unsure Pain improves with: pacing activities and medication Relief from Meds: 7  Family History  Problem Relation Age of Onset   Deep vein thrombosis Neg Hx    Social History   Socioeconomic History   Marital status: Widowed    Spouse name: Not on file   Number of children: Not on file   Years of education: Not on file   Highest education level: Not on file  Occupational History   Not on file  Tobacco Use   Smoking status: Former   Smokeless tobacco: Never   Tobacco comments:    quit 25 years ago   Vaping Use   Vaping  status: Never Used  Substance and Sexual Activity   Alcohol use: No   Drug use: No   Sexual activity: Not Currently  Other Topics Concern   Not on file  Social History Narrative   Not on file   Social Drivers of Health   Financial Resource Strain: Not on file  Food Insecurity: Low Risk  (07/09/2023)   Received from [facility]   Hunger Vital Sign    Within the past 12 months, you worried that your food would run out before you got money to buy more: Never true    Within the past 12 months, the food you bought just didn't last and you didn't have money to get more. : Never true  Transportation Needs: No Transportation Needs (07/09/2023)   Received from [employer]    In the past 12 months, has lack of reliable transportation kept you from medical appointments, meetings, work or from getting things needed for daily living? : No  Physical Activity: Not on file  Stress: Not on file  Social Connections: Unknown (05/27/2023)   Received from [facility]   Social Network    Social Network: Not on file   Past Surgical History:  Procedure Laterality Date   ABDOMINAL HYSTERECTOMY     ANKLE SURGERY     COLONOSCOPY     CORONARY ULTRASOUND/IVUS N/A 05/06/2017   Procedure: INTRAVASCULAR ULTRASOUND/IVUS;  Surgeon: Sheree,  Penne Bruckner, MD;  Location: [facility] OR;  Service: Vascular;  Laterality: N/A;   EYE SURGERY     caratacs   FEMORAL ARTERY EXPLORATION  05/16/2017   Procedure: STENT OF BILATERAL COMMON FEMORAL VEIN WITH 16 X 90 WALL STENT;  Surgeon: Sheree Penne Bruckner, MD;  Location: [facility] OR;  Service: Vascular;;   hemroidectomy     INSERTION OF ILIAC STENT Bilateral 05/16/2017   Procedure: STENT OF BILATERAL COMMON AND EXTERNAL ILIAC VEIN WITH 18 X 90 WALL STENT;  Surgeon: Sheree Penne Bruckner, MD;  Location: [facility] OR;  Service: Vascular;  Laterality: Bilateral;   IVC FILTER INSERTION N/A 05/16/2017   Procedure: MECHANICAL THROMBECTOMY OF IVC FILTER;  Surgeon: Sheree Penne Bruckner, MD;  Location: [facility] OR;  Service: Vascular;  Laterality: N/A;   LAMINECTOMY     LOWER EXTREMITY VENOGRAPHY N/A 05/14/2017   Procedure: Lower Extremity Venography;  Surgeon: Serene Gaile ORN, MD;  [facility]: [facility];  Service: Cardiovascular;  Laterality: N/A;   NASAL SINUS SURGERY     PERCUTANEOUS VENOUS THROMBECTOMY,LYSIS WITH INTRAVASCULAR ULTRASOUND (IVUS) N/A 05/16/2017   Procedure: PERCUTANEOUS VENOUS THROMBECTOMY AND LYSIS WITH INTRAVASCULAR ULTRASOUND (IVUS);  Surgeon: Sheree Penne Bruckner, MD;  Location: [facility] OR;  Service: Vascular;  Laterality: N/A;   VENA CAVA FILTER PLACEMENT N/A 05/16/2017   Procedure: RETRIEVAL INFERIOR VENA-CAVA FILTER;  Surgeon: Sheree Penne Bruckner, MD;  Location: [facility] OR;  Service: Vascular;  Laterality: N/A;   VENOGRAM Bilateral 05/16/2017   Procedure: VENOGRAM OF IVC;  Surgeon: Sheree Penne Bruckner, MD;  Location: [facility] OR;  Service: Vascular;  Laterality: Bilateral;   Past Surgical History:  Procedure Laterality Date   ABDOMINAL HYSTERECTOMY     ANKLE SURGERY     COLONOSCOPY     CORONARY ULTRASOUND/IVUS N/A 05/06/2017   Procedure: INTRAVASCULAR ULTRASOUND/IVUS;  Surgeon: Sheree Penne Bruckner, MD;  Location: [facility] OR;  Service: Vascular;  Laterality: N/A;   EYE SURGERY     caratacs   FEMORAL ARTERY EXPLORATION  05/16/2017   Procedure: STENT OF BILATERAL COMMON FEMORAL VEIN WITH 16 X 90 WALL STENT;  Surgeon: Sheree Penne Bruckner, MD;  Location: [facility] OR;  Service: Vascular;;   hemroidectomy     INSERTION OF ILIAC STENT Bilateral 05/16/2017   Procedure: STENT OF BILATERAL COMMON AND EXTERNAL ILIAC VEIN WITH 18 X 90 WALL STENT;  Surgeon: Sheree Penne Bruckner, MD;  Location: [facility] OR;  Service: Vascular;  Laterality: Bilateral;   IVC FILTER INSERTION N/A 05/16/2017   Procedure: MECHANICAL THROMBECTOMY OF IVC FILTER;  Surgeon: Sheree Penne Bruckner, MD;  Location: [facility] OR;  Service: Vascular;  Laterality: N/A;   LAMINECTOMY      LOWER EXTREMITY VENOGRAPHY N/A 05/14/2017   Procedure: Lower Extremity Venography;  Surgeon: Serene Gaile ORN, MD;  [facility];  Service: Cardiovascular;  Laterality: N/A;   NASAL SINUS SURGERY     PERCUTANEOUS VENOUS THROMBECTOMY,LYSIS WITH INTRAVASCULAR ULTRASOUND (IVUS) N/A 05/16/2017   Procedure: PERCUTANEOUS VENOUS THROMBECTOMY AND LYSIS WITH INTRAVASCULAR ULTRASOUND (IVUS);  Surgeon: Sheree Penne Bruckner, MD;  Location: [facility] OR;  Service: Vascular;  Laterality: N/A;   VENA CAVA FILTER PLACEMENT N/A 05/16/2017   Procedure: RETRIEVAL INFERIOR VENA-CAVA FILTER;  Surgeon: Sheree Penne Bruckner, MD;  Location: [facility] OR;  Service: Vascular;  Laterality: N/A;   VENOGRAM Bilateral 05/16/2017   Procedure: VENOGRAM OF IVC;  Surgeon: Sheree Penne Bruckner, MD;  Location: [facility] OR;  Service: Vascular;  Laterality: Bilateral;   Past Medical History:  Diagnosis Date  Allergy    Anemia    Arthritis    left knee   Back pain    Bipolar disorder (HCC)    Chronic headaches    COPD (chronic obstructive pulmonary disease) (HCC)    Degenerative disorder of bone    Depression    Esophageal ulcer    history   GERD (gastroesophageal reflux disease)    Hyperlipidemia    Myeloma (HCC)    Peripheral vascular disease (HCC)    Pulmonary embolism (HCC)    Scoliosis    There were no vitals taken for this visit.  Opioid Risk Score:   Fall Risk Score:  `1  Depression screen [facility] 2/9     05/04/2024   11:30 AM 03/10/2024   11:21 AM 09/02/2023    1:26 PM 07/12/2023    9:05 AM 05/14/2023    9:54 AM 01/28/2023    1:45 PM 12/12/2022    1:42 PM  Depression screen PHQ 2/9  Decreased Interest 1 1 0 1 1 1  0  Down, Depressed, Hopeless 1 1 0 1 1 1  0  PHQ - 2 Score 2 2 0 2 2 2  0    Review of Systems  Musculoskeletal:  Positive for back pain.       Left shoulder pain,left back pain, pain in both knees  All other systems reviewed and are negative.      Objective:   Physical Exam Vitals and  nursing note reviewed.  Constitutional:      Appearance: Normal appearance.  Cardiovascular:     Rate and Rhythm: Normal rate and regular rhythm.     Pulses: Normal pulses.     Heart sounds: Normal heart sounds.  Pulmonary:     Effort: Pulmonary effort is normal.     Breath sounds: Normal breath sounds.  Musculoskeletal:     Comments: Normal Muscle Bulk and Muscle Testing Reveals:  Upper Extremities: Right: Decreased ROM 30 Degrees  and Muscle Strength  5/5  Left Upper Extremity: Decreased ROM 90 Degrees  and Muscle Strength 5/5 Thoracic Paraspinal Tenderness T-2- T-10 Lumbar Paraspinal Tenderness : L-3-L-5 Lower Extremities: Full ROM and Muscle Strength 5/5 Arises from Table slowly using walker for support Antalgic  Gait     Skin:    General: Skin is warm and dry.  Neurological:     Mental Status: She is alert and oriented to person, place, and time.  Psychiatric:        Mood and Affect: Mood normal.        Behavior: Behavior normal.          Assessment & Plan:  1.  History of osteoporosis with multiple thoracic and lumbar compression fractures as well as bilateral rib fractures, patient is no longer symptomatic at these levels. We will continue to Monitor. 07/06/2024  2.  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 Continue to Monitor. 07/06/2024  3. Bilateral Knee Pain/ Left Knee knee pain: S/P Fall : Hospitalized  at [facility] on 05/25/2022 For: 1.  Acute nondisplaced left patellar fracture with associated hemarthrosis: 2/2 to fall. Orthopedics recommended left knee immobilizer, WBAT, Has Therapy weekly.  Orthopedics following. No falls this month. Continue to monitor. Continue HEP as Tolerated. Continue to Monitor. 07/06/2024 4.  Chronic pain syndrome multifactorial continue hydrocodone  10 mg /325 1 tablet three a day as needed for pain #75.. We will continue the opioid monitoring program, this  consists  of regular clinic visits, examinations, urine drug screen, pill counts as well as use of [place]  Controlled Substance Reporting system. A 12 month History has been reviewed on the [place]  Controlled Substance Reporting System on 07/06/2024   5. Lumbosacral Spondylosis without Myelopathy: Continue HEP as Tolerated. Continue current medication regimen. Continue to Monitor. 07/06/2024 6. Chronic Right  Shoulder Pain: Continue HEP as Tolerated. Continue to monitor. 07/06/2024 7. Chronic Left Hip Pain:  No complaints today. Continue HEP as tolerated. Continue current medication regimen. Continue to monitor. 07/06/2024 8. Meralgia Paresthesia of Left Lower extremity: No complaints today. Continue current medication regimen. Continue to Monitor. 07/06/2024   F/U in 2 months

## 2024-07-06 ENCOUNTER — Encounter: Attending: Physical Medicine & Rehabilitation | Admitting: Registered Nurse

## 2024-07-06 ENCOUNTER — Encounter: Payer: Self-pay | Admitting: Registered Nurse

## 2024-07-06 VITALS — BP 115/76 | HR 77 | Ht <= 58 in

## 2024-07-06 DIAGNOSIS — M546 Pain in thoracic spine: Secondary | ICD-10-CM | POA: Diagnosis present

## 2024-07-06 DIAGNOSIS — G894 Chronic pain syndrome: Secondary | ICD-10-CM

## 2024-07-06 DIAGNOSIS — G8929 Other chronic pain: Secondary | ICD-10-CM | POA: Diagnosis present

## 2024-07-06 DIAGNOSIS — M25562 Pain in left knee: Secondary | ICD-10-CM

## 2024-07-06 DIAGNOSIS — M25561 Pain in right knee: Secondary | ICD-10-CM

## 2024-07-06 DIAGNOSIS — Z5181 Encounter for therapeutic drug level monitoring: Secondary | ICD-10-CM

## 2024-07-06 DIAGNOSIS — Z79891 Long term (current) use of opiate analgesic: Secondary | ICD-10-CM

## 2024-07-06 DIAGNOSIS — M5416 Radiculopathy, lumbar region: Secondary | ICD-10-CM | POA: Diagnosis present

## 2024-07-06 DIAGNOSIS — M25511 Pain in right shoulder: Secondary | ICD-10-CM

## 2024-07-06 MED ORDER — HYDROCODONE-ACETAMINOPHEN 10-325 MG PO TABS: 1.0000 | ORAL_TABLET | Freq: Three times a day (TID) | ORAL | 0 refills | Status: DC | PRN

## 2024-07-07 ENCOUNTER — Telehealth: Payer: Self-pay

## 2024-07-07 NOTE — Telephone Encounter (Signed)
 [name] May is having left rib area pain. Her daughter stated it's so bad she could not sleep last night and she was crying today. The daughter wanted to know if she could take her pain medication 3 times a day.   Rosina (daughter) has been advised to take her to the ED for Urgent Care for an evaluation  and follow. Do not increase the pain medication.  Advise okayed by Fidela Ned NP

## 2024-07-09 LAB — DRUG TOX MONITOR 1 W/CONF, ORAL FLD

## 2024-07-09 LAB — DRUG TOX ALC METAB W/CON, ORAL FLD: Alcohol Metabolite: NEGATIVE ng/mL (ref <25)

## 2024-08-31 ENCOUNTER — Encounter: Payer: Self-pay | Admitting: Registered Nurse

## 2024-08-31 ENCOUNTER — Encounter: Attending: Physical Medicine & Rehabilitation | Admitting: Registered Nurse

## 2024-08-31 ENCOUNTER — Ambulatory Visit (HOSPITAL_BASED_OUTPATIENT_CLINIC_OR_DEPARTMENT_OTHER)
Admission: RE | Admit: 2024-08-31 | Discharge: 2024-08-31 | Disposition: A | Discharge status: Home / Self Care | Source: Ambulatory Visit | Attending: Registered Nurse | Admitting: Registered Nurse

## 2024-08-31 VITALS — BP 116/73 | HR 80 | Ht <= 58 in | Wt 133.0 lb

## 2024-08-31 DIAGNOSIS — M25511 Pain in right shoulder: Secondary | ICD-10-CM | POA: Insufficient documentation

## 2024-08-31 DIAGNOSIS — M546 Pain in thoracic spine: Secondary | ICD-10-CM | POA: Diagnosis not present

## 2024-08-31 DIAGNOSIS — M25562 Pain in left knee: Secondary | ICD-10-CM | POA: Diagnosis present

## 2024-08-31 DIAGNOSIS — Z5181 Encounter for therapeutic drug level monitoring: Secondary | ICD-10-CM | POA: Insufficient documentation

## 2024-08-31 DIAGNOSIS — M47817 Spondylosis without myelopathy or radiculopathy, lumbosacral region: Secondary | ICD-10-CM | POA: Diagnosis not present

## 2024-08-31 DIAGNOSIS — G8929 Other chronic pain: Secondary | ICD-10-CM | POA: Insufficient documentation

## 2024-08-31 DIAGNOSIS — M25561 Pain in right knee: Secondary | ICD-10-CM | POA: Diagnosis not present

## 2024-08-31 DIAGNOSIS — G894 Chronic pain syndrome: Secondary | ICD-10-CM | POA: Insufficient documentation

## 2024-08-31 DIAGNOSIS — Z79891 Long term (current) use of opiate analgesic: Secondary | ICD-10-CM | POA: Diagnosis present

## 2024-08-31 DIAGNOSIS — R0789 Other chest pain: Secondary | ICD-10-CM | POA: Insufficient documentation

## 2024-08-31 MED ORDER — HYDROCODONE-ACETAMINOPHEN 10-325 MG PO TABS: 1.0000 | ORAL_TABLET | Freq: Three times a day (TID) | ORAL | 0 refills | Status: DC | PRN

## 2024-08-31 NOTE — Progress Notes (Signed)
 Subjective:    Patient ID: [name] May, female    DOB: 18-Feb-1941, 83 y.o.   MRN: [id number]  HPI: [name] May is a 83 y.o. female who returns for follow up appointment for chronic pain and medication refill. She states her  pain is located in her right shoulder, mid- lower back and bilateral knee pain L>R. She  rates her pain 7. Her current exercise regime is walking with her cane .  Ms,. Coufal reports left rib pain, denies falling. She states she heard a popping side, near her left rib a few weeks ago, X-ray ordered. She and daughter verbalizes understanding.   Ms. Novinger Morphine  equivalent is 20.00 MME, Ms. Odonnel is taking  her Hydrocodne 1-2 times a day as needed for pain.   Last oral swab was performed on 07/06/2024   Pain Inventory Average Pain 6 Pain Right Now 7 My pain is intermittent, burning, and tingling  In the last 24 hours, has pain interfered with the following? General activity 3 Relation with others 0 Enjoyment of life 4 What TIME of day is your pain at its worst? varies Sleep (in general) Good  Pain is worse with: some activites Pain improves with: rest, heat/ice, and medication Relief from Meds: 3  Family History  Problem Relation Age of Onset   Deep vein thrombosis Neg Hx    Social History   Socioeconomic History   Marital status: Widowed    Spouse name: Not on file   Number of children: Not on file   Years of education: Not on file   Highest education level: Not on file  Occupational History   Not on file  Tobacco Use   Smoking status: Former   Smokeless tobacco: Never   Tobacco comments:    quit 25 years ago   Vaping Use   Vaping status: Never Used  Substance and Sexual Activity   Alcohol use: No   Drug use: No   Sexual activity: Not Currently  Other Topics Concern   Not on file  Social History Narrative   Not on file   Social Drivers of Health   Financial Resource Strain: Not on file  Food Insecurity: Low Risk   (07/09/2023)   Received from [facility]   Hunger Vital Sign    Within the past 12 months, you worried that your food would run out before you got money to buy more: Never true    Within the past 12 months, the food you bought just didn't last and you didn't have money to get more. : Never true  Transportation Needs: No Transportation Needs (07/09/2023)   Received from [employer]    In the past 12 months, has lack of reliable transportation kept you from medical appointments, meetings, work or from getting things needed for daily living? : No  Physical Activity: Not on file  Stress: Not on file  Social Connections: Unknown (05/27/2023)   Received from [facility]   Social Network    Social Network: Not on file   Past Surgical History:  Procedure Laterality Date   ABDOMINAL HYSTERECTOMY     ANKLE SURGERY     COLONOSCOPY     CORONARY ULTRASOUND/IVUS N/A 05/06/2017   Procedure: INTRAVASCULAR ULTRASOUND/IVUS;  Surgeon: Sheree Penne Bruckner, MD;  Location: [facility] OR;  Service: Vascular;  Laterality: N/A;   EYE SURGERY     caratacs   FEMORAL ARTERY EXPLORATION  05/16/2017   Procedure: STENT OF BILATERAL COMMON  FEMORAL VEIN WITH 16 X 90 WALL STENT;  Surgeon: Sheree Penne Bruckner, MD;  Location: [facility] OR;  Service: Vascular;;   hemroidectomy     INSERTION OF ILIAC STENT Bilateral 05/16/2017   Procedure: STENT OF BILATERAL COMMON AND EXTERNAL ILIAC VEIN WITH 18 X 90 WALL STENT;  Surgeon: Sheree Penne Bruckner, MD;  Location: [facility] OR;  Service: Vascular;  Laterality: Bilateral;   IVC FILTER INSERTION N/A 05/16/2017   Procedure: MECHANICAL THROMBECTOMY OF IVC FILTER;  Surgeon: Sheree Penne Bruckner, MD;  Location: [facility] OR;  Service: Vascular;  Laterality: N/A;   LAMINECTOMY     LOWER EXTREMITY VENOGRAPHY N/A 05/14/2017   Procedure: Lower Extremity Venography;  Surgeon: Serene Gaile ORN, MD;  [facility]: [facility];  Service: Cardiovascular;  Laterality: N/A;   NASAL  SINUS SURGERY     PERCUTANEOUS VENOUS THROMBECTOMY,LYSIS WITH INTRAVASCULAR ULTRASOUND (IVUS) N/A 05/16/2017   Procedure: PERCUTANEOUS VENOUS THROMBECTOMY AND LYSIS WITH INTRAVASCULAR ULTRASOUND (IVUS);  Surgeon: Sheree Penne Bruckner, MD;  Location: [name] OR;  Service: Vascular;  Laterality: N/A;   VENA CAVA FILTER PLACEMENT N/A 05/16/2017   Procedure: RETRIEVAL INFERIOR VENA-CAVA FILTER;  Surgeon: Sheree Penne Bruckner, MD;  Location: [facility] OR;  Service: Vascular;  Laterality: N/A;   VENOGRAM Bilateral 05/16/2017   Procedure: VENOGRAM OF IVC;  Surgeon: Sheree Penne Bruckner, MD;  Location: [facility] OR;  Service: Vascular;  Laterality: Bilateral;   Past Surgical History:  Procedure Laterality Date   ABDOMINAL HYSTERECTOMY     ANKLE SURGERY     COLONOSCOPY     CORONARY ULTRASOUND/IVUS N/A 05/06/2017   Procedure: INTRAVASCULAR ULTRASOUND/IVUS;  Surgeon: Sheree Penne Bruckner, MD;  Location: [facility] OR;  Service: Vascular;  Laterality: N/A;   EYE SURGERY     caratacs   FEMORAL ARTERY EXPLORATION  05/16/2017   Procedure: STENT OF BILATERAL COMMON FEMORAL VEIN WITH 16 X 90 WALL STENT;  Surgeon: Sheree Penne Bruckner, MD;  Location: [facility] OR;  Service: Vascular;;   hemroidectomy     INSERTION OF ILIAC STENT Bilateral 05/16/2017   Procedure: STENT OF BILATERAL COMMON AND EXTERNAL ILIAC VEIN WITH 18 X 90 WALL STENT;  Surgeon: Sheree Penne Bruckner, MD;  Location: [facility] OR;  Service: Vascular;  Laterality: Bilateral;   IVC FILTER INSERTION N/A 05/16/2017   Procedure: MECHANICAL THROMBECTOMY OF IVC FILTER;  Surgeon: Sheree Penne Bruckner, MD;  Location: [facility] OR;  Service: Vascular;  Laterality: N/A;   LAMINECTOMY     LOWER EXTREMITY VENOGRAPHY N/A 05/14/2017   Procedure: Lower Extremity Venography;  Surgeon: Serene Gaile ORN, MD;  [facility];  Service: Cardiovascular;  Laterality: N/A;   NASAL SINUS SURGERY     PERCUTANEOUS VENOUS THROMBECTOMY,LYSIS WITH INTRAVASCULAR ULTRASOUND (IVUS) N/A  05/16/2017   Procedure: PERCUTANEOUS VENOUS THROMBECTOMY AND LYSIS WITH INTRAVASCULAR ULTRASOUND (IVUS);  Surgeon: Sheree Penne Bruckner, MD;  Location: [facility] OR;  Service: Vascular;  Laterality: N/A;   VENA CAVA FILTER PLACEMENT N/A 05/16/2017   Procedure: RETRIEVAL INFERIOR VENA-CAVA FILTER;  Surgeon: Sheree Penne Bruckner, MD;  Location: [facility] OR;  Service: Vascular;  Laterality: N/A;   VENOGRAM Bilateral 05/16/2017   Procedure: VENOGRAM OF IVC;  Surgeon: Sheree Penne Bruckner, MD;  Location: [facility] OR;  Service: Vascular;  Laterality: Bilateral;   Past Medical History:  Diagnosis Date   Allergy    Anemia    Arthritis    left knee   Back pain    Bipolar disorder (HCC)    Chronic headaches    COPD (chronic obstructive pulmonary  disease) (HCC)    Degenerative disorder of bone    Depression    Esophageal ulcer    history   GERD (gastroesophageal reflux disease)    Hyperlipidemia    Myeloma (HCC)    Peripheral vascular disease    Pulmonary embolism (HCC)    Scoliosis    There were no vitals taken for this visit.  Opioid Risk Score:   Fall Risk Score:  `1  Depression screen [facility] 2/9     07/06/2024   11:27 AM 05/04/2024   11:30 AM 03/10/2024   11:21 AM 09/02/2023    1:26 PM 07/12/2023    9:05 AM 05/14/2023    9:54 AM 01/28/2023    1:45 PM  Depression screen PHQ 2/9  Decreased Interest 1 1 1  0 1 1 1   Down, Depressed, Hopeless 1 1 1  0 1 1 1   PHQ - 2 Score 2 2 2  0 2 2 2      Review of Systems  Constitutional: Negative.   HENT: Negative.    Eyes: Negative.   Respiratory: Negative.    Gastrointestinal: Negative.   Endocrine: Negative.   Genitourinary: Negative.   Musculoskeletal:  Positive for arthralgias, back pain and neck pain.  Skin: Negative.   Allergic/Immunologic: Negative.   Neurological: Negative.   Psychiatric/Behavioral: Negative.         Objective:   Physical Exam Vitals and nursing note reviewed.  Constitutional:      Appearance: Normal appearance.   Cardiovascular:     Rate and Rhythm: Normal rate and regular rhythm.     Pulses: Normal pulses.     Heart sounds: Normal heart sounds.  Pulmonary:     Effort: Pulmonary effort is normal.     Breath sounds: Normal breath sounds.  Musculoskeletal:     Comments: Normal Muscle Bulk and Muscle Testing Reveals:  Upper Extremities: Right: Decreased ROM 20 Degrees  and Muscle Strength 5/5 Right AC Joint Tenderness  Left Upper Extremity: Decreased ROM 90 Degrees and Muscle Strength 5/5 Thoracic Paraspinal Tenderness: T-7-T-10  Lumbar Paraspinal Tenderness: L-3-L-5 Lower Extremities: Full ROM and Muscle Strength 5/5 Arises from Table slowly using cane for support Narrow Based  Gait     Skin:    General: Skin is warm and dry.  Neurological:     Mental Status: She is alert and oriented to person, place, and time.  Psychiatric:        Mood and Affect: Mood normal.        Behavior: Behavior normal.          Assessment & Plan:  1.  History of osteoporosis with multiple thoracic and lumbar compression fractures as well as bilateral rib fractures, patient is no longer symptomatic at these levels. We will continue to Monitor. 08/31/2024  2.  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 Continue to Monitor. 08/31/2024  3. Bilateral Knee Pain/ Left Knee knee pain: S/P Fall : Hospitalized  at [facility] on 05/25/2022 For: 1.  Acute nondisplaced left patellar fracture with associated hemarthrosis: 2/2 to fall. Orthopedics recommended left knee immobilizer, WBAT, Has Therapy weekly.  Orthopedics following. No falls this month. Continue to monitor. Continue HEP as Tolerated. Continue to Monitor. 08/31/2024 4.  Chronic pain syndrome multifactorial continue hydrocodone  10 mg /325 1 tablet three a day as needed for pain #75.. We will continue the opioid monitoring program, this consists of regular clinic visits, examinations,  urine  drug screen, pill counts as well as use of [place]  Controlled Substance Reporting system. A 12 month History has been reviewed on the [place]  Controlled Substance Reporting System on 08/31/2024   5. Lumbosacral Spondylosis without Myelopathy: Continue HEP as Tolerated. Continue current medication regimen. Continue to Monitor. 08/31/2024 6. Chronic Right  Shoulder Pain: Continue HEP as Tolerated. Continue to monitor. 08/31/2024 7. Chronic Left Hip Pain:  No complaints today. Continue HEP as tolerated. Continue current medication regimen. Continue to monitor. 08/31/2024 8. Meralgia Paresthesia of Left Lower extremity: No complaints today. Continue current medication regimen. Continue to Monitor. 08/31/2024 9. Left Rib Pain: denies falling ; RX: Rib X-ray ordered. Ms. Clegg and daughter verbalizes understanding.  F/U in 2 months

## 2024-10-26 ENCOUNTER — Encounter: Payer: Self-pay | Admitting: Registered Nurse

## 2024-10-26 ENCOUNTER — Encounter: Attending: Physical Medicine & Rehabilitation | Admitting: Registered Nurse

## 2024-10-26 VITALS — BP 107/66 | HR 68 | Ht <= 58 in | Wt 131.0 lb

## 2024-10-26 DIAGNOSIS — G894 Chronic pain syndrome: Secondary | ICD-10-CM | POA: Diagnosis not present

## 2024-10-26 DIAGNOSIS — M25561 Pain in right knee: Secondary | ICD-10-CM | POA: Insufficient documentation

## 2024-10-26 DIAGNOSIS — M25562 Pain in left knee: Secondary | ICD-10-CM | POA: Insufficient documentation

## 2024-10-26 DIAGNOSIS — M5416 Radiculopathy, lumbar region: Secondary | ICD-10-CM | POA: Insufficient documentation

## 2024-10-26 DIAGNOSIS — Z5181 Encounter for therapeutic drug level monitoring: Secondary | ICD-10-CM | POA: Diagnosis not present

## 2024-10-26 DIAGNOSIS — M546 Pain in thoracic spine: Secondary | ICD-10-CM | POA: Insufficient documentation

## 2024-10-26 DIAGNOSIS — G8929 Other chronic pain: Secondary | ICD-10-CM | POA: Insufficient documentation

## 2024-10-26 DIAGNOSIS — M25511 Pain in right shoulder: Secondary | ICD-10-CM | POA: Diagnosis not present

## 2024-10-26 DIAGNOSIS — Z79891 Long term (current) use of opiate analgesic: Secondary | ICD-10-CM | POA: Diagnosis not present

## 2024-10-26 DIAGNOSIS — M47817 Spondylosis without myelopathy or radiculopathy, lumbosacral region: Secondary | ICD-10-CM | POA: Insufficient documentation

## 2024-10-26 MED ORDER — HYDROCODONE-ACETAMINOPHEN 10-325 MG PO TABS: 1.0000 | ORAL_TABLET | Freq: Three times a day (TID) | ORAL | 0 refills | Status: AC | PRN

## 2024-10-26 NOTE — Progress Notes (Unsigned)
 Subjective:    Patient ID: [name] May, female    DOB: [birth year], 83 y.o.   MRN: [id number]  HPI: [name] May is a 83 y.o. female who returns for follow up appointment for chronic pain and medication refill. states *** pain is located in  ***. rates pain ***. current exercise regime is walking and performing stretching exercises.  [name] May [name] May  equivalent is *** MME.   Oral Swab was Performed today.      Pain Inventory Average Pain 6 Pain Right Now 6 My pain is intermittent, burning, and aching  In the last 24 hours, has pain interfered with the following? General activity 7 Relation with others 0 Enjoyment of life 7 What TIME of day is your pain at its worst? morning , daytime, evening, and night Sleep (in general) Good  Pain is worse with: walking, bending, and standing Pain improves with: pacing activities, medication, and heat Relief from Meds: good  Family History  Problem Relation Age of Onset   Deep vein thrombosis Neg Hx    Social History   Socioeconomic History   Marital status: Widowed    Spouse name: Not on file   Number of children: Not on file   Years of education: Not on file   Highest education level: Not on file  Occupational History   Not on file  Tobacco Use   Smoking status: Former   Smokeless tobacco: Never   Tobacco comments:    quit 25 years ago   Vaping Use   Vaping status: Never Used  Substance and Sexual Activity   Alcohol use: No   Drug use: No   Sexual activity: Not Currently  Other Topics Concern   Not on file  Social History Narrative   Not on file   Social Drivers of Health   Tobacco Use: Medium Risk (08/31/2024)   Patient History    Smoking Tobacco Use: Former    Smokeless Tobacco Use: Never    Passive Exposure: Not on Actuary Strain: Not on file  Food Insecurity: Low Risk (07/09/2023)   Received from [facility]   Epic    Within the past 12 months, you worried that your food would  run out before you got money to buy more: Never true    Within the past 12 months, the food you bought just didn't last and you didn't have money to get more. : Never true  Transportation Needs: No Transportation Needs (07/09/2023)   Received from [employer]    In the past 12 months, has lack of reliable transportation kept you from medical appointments, meetings, work or from getting things needed for daily living? : No  Physical Activity: Not on file  Stress: Not on file  Social Connections: Unknown (05/27/2023)   Received from [facility]   Social Network    Social Network: Not on file  Depression (PHQ2-9): Low Risk (07/06/2024)   Depression (PHQ2-9)    PHQ-2 Score: 2  Alcohol Screen: Not on file  Housing: Low Risk (10/06/2024)   Received from [facility]   Epic    Think about the place you live. Do you have problems with any of the following? Choose all that apply:: Not on file    What is your living situation today?: I have a steady place to live  Utilities: Low Risk (07/09/2023)   Received from [facility]   Utilities    In the past 12 months has  the electric, gas, oil, or water company threatened to shut off services in your home? : No  Health Literacy: Not on file   Past Surgical History:  Procedure Laterality Date   ABDOMINAL HYSTERECTOMY     ANKLE SURGERY     COLONOSCOPY     CORONARY ULTRASOUND/IVUS N/A 05/06/2017   Procedure: INTRAVASCULAR ULTRASOUND/IVUS;  Surgeon: Sheree Penne Bruckner, MD;  Location: [facility] OR;  Service: Vascular;  Laterality: N/A;   EYE SURGERY     caratacs   FEMORAL ARTERY EXPLORATION  05/16/2017   Procedure: STENT OF BILATERAL COMMON FEMORAL VEIN WITH 16 X 90 WALL STENT;  Surgeon: Sheree Penne Bruckner, MD;  Location: [facility] OR;  Service: Vascular;;   hemroidectomy     INSERTION OF ILIAC STENT Bilateral 05/16/2017   Procedure: STENT OF BILATERAL COMMON AND EXTERNAL ILIAC VEIN WITH 18 X 90 WALL STENT;  Surgeon: Sheree Penne Bruckner, MD;  Location: [facility] OR;  Service: Vascular;  Laterality: Bilateral;   IVC FILTER INSERTION N/A 05/16/2017   Procedure: MECHANICAL THROMBECTOMY OF IVC FILTER;  Surgeon: Sheree Penne Bruckner, MD;  Location: [facility] OR;  Service: Vascular;  Laterality: N/A;   LAMINECTOMY     LOWER EXTREMITY VENOGRAPHY N/A 05/14/2017   Procedure: Lower Extremity Venography;  Surgeon: Serene Gaile ORN, MD;  [facility];  Service: Cardiovascular;  Laterality: N/A;   NASAL SINUS SURGERY     PERCUTANEOUS VENOUS THROMBECTOMY,LYSIS WITH INTRAVASCULAR ULTRASOUND (IVUS) N/A 05/16/2017   Procedure: PERCUTANEOUS VENOUS THROMBECTOMY AND LYSIS WITH INTRAVASCULAR ULTRASOUND (IVUS);  Surgeon: Sheree Penne Bruckner, MD;  Location: [facility] OR;  Service: Vascular;  Laterality: N/A;   VENA CAVA FILTER PLACEMENT N/A 05/16/2017   Procedure: RETRIEVAL INFERIOR VENA-CAVA FILTER;  Surgeon: Sheree Penne Bruckner, MD;  Location: [facility] OR;  Service: Vascular;  Laterality: N/A;   VENOGRAM Bilateral 05/16/2017   Procedure: VENOGRAM OF IVC;  Surgeon: Sheree Penne Bruckner, MD;  Location: [facility] OR;  Service: Vascular;  Laterality: Bilateral;   Past Surgical History:  Procedure Laterality Date   ABDOMINAL HYSTERECTOMY     ANKLE SURGERY     COLONOSCOPY     CORONARY ULTRASOUND/IVUS N/A 05/06/2017   Procedure: INTRAVASCULAR ULTRASOUND/IVUS;  Surgeon: Sheree Penne Bruckner, MD;  Location: [facility] OR;  Service: Vascular;  Laterality: N/A;   EYE SURGERY     caratacs   FEMORAL ARTERY EXPLORATION  05/16/2017   Procedure: STENT OF BILATERAL COMMON FEMORAL VEIN WITH 16 X 90 WALL STENT;  Surgeon: Sheree Penne Bruckner, MD;  Location: [facility] OR;  Service: Vascular;;   hemroidectomy     INSERTION OF ILIAC STENT Bilateral 05/16/2017   Procedure: STENT OF BILATERAL COMMON AND EXTERNAL ILIAC VEIN WITH 18 X 90 WALL STENT;  Surgeon: Sheree Penne Bruckner, MD;  Location: [facility] OR;  Service: Vascular;  Laterality: Bilateral;   IVC FILTER  INSERTION N/A 05/16/2017   Procedure: MECHANICAL THROMBECTOMY OF IVC FILTER;  Surgeon: Sheree Penne Bruckner, MD;  Location: [facility] OR;  Service: Vascular;  Laterality: N/A;   LAMINECTOMY     LOWER EXTREMITY VENOGRAPHY N/A 05/14/2017   Procedure: Lower Extremity Venography;  Surgeon: Serene Gaile ORN, MD;  [facility]: [facility];  Service: Cardiovascular;  Laterality: N/A;   NASAL SINUS SURGERY     PERCUTANEOUS VENOUS THROMBECTOMY,LYSIS WITH INTRAVASCULAR ULTRASOUND (IVUS) N/A 05/16/2017   Procedure: PERCUTANEOUS VENOUS THROMBECTOMY AND LYSIS WITH INTRAVASCULAR ULTRASOUND (IVUS);  Surgeon: Sheree Penne Bruckner, MD;  Location: [facility] OR;  Service: Vascular;  Laterality: N/A;   VENA CAVA FILTER  PLACEMENT N/A 05/16/2017   Procedure: RETRIEVAL INFERIOR VENA-CAVA FILTER;  Surgeon: Sheree Penne Bruckner, MD;  Location: [facility] OR;  Service: Vascular;  Laterality: N/A;   VENOGRAM Bilateral 05/16/2017   Procedure: VENOGRAM OF IVC;  Surgeon: Sheree Penne Bruckner, MD;  Location: [facility] OR;  Service: Vascular;  Laterality: Bilateral;   Past Medical History:  Diagnosis Date   Allergy    Anemia    Arthritis    left knee   Back pain    Bipolar disorder (HCC)    Chronic headaches    COPD (chronic obstructive pulmonary disease) (HCC)    Degenerative disorder of bone    Depression    Esophageal ulcer    history   GERD (gastroesophageal reflux disease)    Hyperlipidemia    Myeloma (HCC)    Peripheral vascular disease    Pulmonary embolism (HCC)    Scoliosis    There were no vitals taken for this visit.  Opioid Risk Score:   Fall Risk Score:  `1  Depression screen [facility] 2/9     07/06/2024   11:27 AM 05/04/2024   11:30 AM 03/10/2024   11:21 AM 09/02/2023    1:26 PM 07/12/2023    9:05 AM 05/14/2023    9:54 AM 01/28/2023    1:45 PM  Depression screen PHQ 2/9  Decreased Interest 1 1 1  0 1 1 1   Down, Depressed, Hopeless 1 1 1  0 1 1 1   PHQ - 2 Score 2 2 2  0 2 2 2     Review of Systems   Musculoskeletal:  Positive for back pain.       Right shoulder pain, pain in both knees, left left knee pain up to left hip  All other systems reviewed and are negative.      Objective:   Physical Exam        Assessment & Plan:  1.  History of osteoporosis with multiple thoracic and lumbar compression fractures as well as bilateral rib fractures, patient is no longer symptomatic at these levels. We will continue to Monitor. 08/31/2024  2.  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 Continue to Monitor. 08/31/2024  3. Bilateral Knee Pain/ Left Knee knee pain: S/P Fall : Hospitalized  at [facility] on 05/25/2022 For: 1.  Acute nondisplaced left patellar fracture with associated hemarthrosis: 2/2 to fall. Orthopedics recommended left knee immobilizer, WBAT, Has Therapy weekly.  Orthopedics following. No falls this month. Continue to monitor. Continue HEP as Tolerated. Continue to Monitor. 08/31/2024 4.  Chronic pain syndrome multifactorial continue hydrocodone  10 mg /325 1 tablet three a day as needed for pain #75.. We will continue the opioid monitoring program, this consists of regular clinic visits, examinations, urine drug screen, pill counts as well as use of [place]  Controlled Substance Reporting system. A 12 month History has been reviewed on the [place]  Controlled Substance Reporting System on 08/31/2024   5. Lumbosacral Spondylosis without Myelopathy: Continue HEP as Tolerated. Continue current medication regimen. Continue to Monitor. 08/31/2024 6. Chronic Right  Shoulder Pain: Continue HEP as Tolerated. Continue to monitor. 08/31/2024 7. Chronic Left Hip Pain:  No complaints today. Continue HEP as tolerated. Continue current medication regimen. Continue to monitor. 08/31/2024 8. Meralgia Paresthesia of Left Lower extremity: No complaints today. Continue current medication regimen. Continue to  Monitor. 08/31/2024 9. Left Rib Pain: denies falling ; RX: Rib X-ray ordered. [name] May and daughter verbalizes  understanding.  F/U in 2 months

## 2024-10-29 LAB — DRUG TOX MONITOR 1 W/CONF, ORAL FLD
Amphetamines: NEGATIVE ng/mL (ref <10)
Barbiturates: NEGATIVE ng/mL (ref <10)
Benzodiazepines: NEGATIVE ng/mL (ref <0.50)
Buprenorphine: NEGATIVE ng/mL (ref <0.10)
Cocaine: NEGATIVE ng/mL (ref <5.0)
Codeine: NEGATIVE ng/mL (ref <2.5)
Dihydrocodeine: NEGATIVE ng/mL (ref <2.5)
Fentanyl: NEGATIVE ng/mL (ref <0.10)
Fentanyl: NEGATIVE ng/mL (ref <0.10)
Heroin Metabolite: NEGATIVE ng/mL (ref <1.0)
Hydrocodone: 2.8 ng/mL — ABNORMAL HIGH (ref <2.5)
Hydromorphone: NEGATIVE ng/mL (ref <2.5)
MARIJUANA: NEGATIVE ng/mL (ref <2.5)
MDMA: NEGATIVE ng/mL (ref <10)
Meprobamate: NEGATIVE ng/mL (ref <2.5)
Methadone: NEGATIVE ng/mL (ref <5.0)
Morphine: NEGATIVE ng/mL (ref <2.5)
Nicotine Metabolite: NEGATIVE ng/mL (ref <5.0)
Norhydrocodone: NEGATIVE ng/mL (ref <2.5)
Noroxycodone: NEGATIVE ng/mL (ref <2.5)
Opiates: POSITIVE ng/mL — AB (ref <2.5)
Oxycodone: NEGATIVE ng/mL (ref <2.5)
Oxymorphone: NEGATIVE ng/mL (ref <2.5)
Phencyclidine: NEGATIVE ng/mL (ref <10)
Tapentadol: NEGATIVE ng/mL (ref <5.0)
Tramadol: NEGATIVE ng/mL (ref <5.0)
Zolpidem: NEGATIVE ng/mL (ref <5.0)

## 2024-10-29 LAB — DRUG TOX ALC METAB W/CON, ORAL FLD: Alcohol Metabolite: NEGATIVE ng/mL (ref <25)

## 2024-12-18 NOTE — Progress Notes (Unsigned)
 "  Subjective:    Patient ID: [name] May, female    DOB: 1941/09/07, 84 y.o.   MRN: [id number]  HPI   Pain Inventory Average Pain {NUMBERS; 0-10:5044} Pain Right Now {NUMBERS; 0-10:5044} My pain is {PAIN DESCRIPTION:21022940}  In the last 24 hours, has pain interfered with the following? General activity {NUMBERS; 0-10:5044} Relation with others {NUMBERS; 0-10:5044} Enjoyment of life {NUMBERS; 0-10:5044} What TIME of day is your pain at its worst? {time of day:24191} Sleep (in general) {BHH GOOD/FAIR/POOR:22877}  Pain is worse with: {ACTIVITIES:21022942} Pain improves with: {PAIN IMPROVES TPUY:78977056} Relief from Meds: {NUMBERS; 0-10:5044}  Family History  Problem Relation Age of Onset   Deep vein thrombosis Neg Hx    Social History   Socioeconomic History   Marital status: Widowed    Spouse name: Not on file   Number of children: Not on file   Years of education: Not on file   Highest education level: Not on file  Occupational History   Not on file  Tobacco Use   Smoking status: Former   Smokeless tobacco: Never   Tobacco comments:    quit 25 years ago   Vaping Use   Vaping status: Never Used  Substance and Sexual Activity   Alcohol use: No   Drug use: No   Sexual activity: Not Currently  Other Topics Concern   Not on file  Social History Narrative   Not on file   Social Drivers of Health   Tobacco Use: Medium Risk (10/26/2024)   Patient History    Smoking Tobacco Use: Former    Smokeless Tobacco Use: Never    Passive Exposure: Not on Actuary Strain: Not on file  Food Insecurity: Low Risk (07/09/2023)   Received from [facility]   Epic    Within the past 12 months, you worried that your food would run out before you got money to buy more: Never true    Within the past 12 months, the food you bought just didn't last and you didn't have money to get more. : Never true  Transportation Needs: No Transportation Needs (07/09/2023)    Received from [employer]    In the past 12 months, has lack of reliable transportation kept you from medical appointments, meetings, work or from getting things needed for daily living? : No  Physical Activity: Not on file  Stress: Not on file  Social Connections: Unknown (05/27/2023)   Received from [facility]   Social Network    Social Network: Not on file  Depression (PHQ2-9): Low Risk (10/26/2024)   Depression (PHQ2-9)    PHQ-2 Score: 2  Alcohol Screen: Not on file  Housing: Low Risk (11/11/2024)   Received from [facility]   Epic    What is your living situation today?: I have a steady place to live    Think about the place you live. Do you have problems with any of the following? Choose all that apply:: Not on file  Utilities: Low Risk (07/09/2023)   Received from [facility]   Utilities    In the past 12 months has the electric, gas, oil, or water company threatened to shut off services in your home? : No  Health Literacy: Not on file   Past Surgical History:  Procedure Laterality Date   ABDOMINAL HYSTERECTOMY     ANKLE SURGERY     COLONOSCOPY     CORONARY ULTRASOUND/IVUS N/A 05/06/2017   Procedure: INTRAVASCULAR ULTRASOUND/IVUS;  Surgeon: Sheree Penne Bruckner, MD;  Location: [facility] OR;  Service: Vascular;  Laterality: N/A;   EYE SURGERY     caratacs   FEMORAL ARTERY EXPLORATION  05/16/2017   Procedure: STENT OF BILATERAL COMMON FEMORAL VEIN WITH 16 X 90 WALL STENT;  Surgeon: Sheree Penne Bruckner, MD;  Location: [facility] OR;  Service: Vascular;;   hemroidectomy     INSERTION OF ILIAC STENT Bilateral 05/16/2017   Procedure: STENT OF BILATERAL COMMON AND EXTERNAL ILIAC VEIN WITH 18 X 90 WALL STENT;  Surgeon: Sheree Penne Bruckner, MD;  Location: [facility] OR;  Service: Vascular;  Laterality: Bilateral;   IVC FILTER INSERTION N/A 05/16/2017   Procedure: MECHANICAL THROMBECTOMY OF IVC FILTER;  Surgeon: Sheree Penne Bruckner, MD;  Location: [facility] OR;  Service:  Vascular;  Laterality: N/A;   LAMINECTOMY     LOWER EXTREMITY VENOGRAPHY N/A 05/14/2017   Procedure: Lower Extremity Venography;  Surgeon: Serene Gaile ORN, MD;  Location: [facility];  Service: Cardiovascular;  Laterality: N/A;   NASAL SINUS SURGERY     PERCUTANEOUS VENOUS THROMBECTOMY,LYSIS WITH INTRAVASCULAR ULTRASOUND (IVUS) N/A 05/16/2017   Procedure: PERCUTANEOUS VENOUS THROMBECTOMY AND LYSIS WITH INTRAVASCULAR ULTRASOUND (IVUS);  Surgeon: Sheree Penne Bruckner, MD;  Location: [facility] OR;  Service: Vascular;  Laterality: N/A;   VENA CAVA FILTER PLACEMENT N/A 05/16/2017   Procedure: RETRIEVAL INFERIOR VENA-CAVA FILTER;  Surgeon: Sheree Penne Bruckner, MD;  Location: [facility] OR;  Service: Vascular;  Laterality: N/A;   VENOGRAM Bilateral 05/16/2017   Procedure: VENOGRAM OF IVC;  Surgeon: Sheree Penne Bruckner, MD;  Location: [facility] OR;  Service: Vascular;  Laterality: Bilateral;   Past Surgical History:  Procedure Laterality Date   ABDOMINAL HYSTERECTOMY     ANKLE SURGERY     COLONOSCOPY     CORONARY ULTRASOUND/IVUS N/A 05/06/2017   Procedure: INTRAVASCULAR ULTRASOUND/IVUS;  Surgeon: Sheree Penne Bruckner, MD;  Location: [facility] OR;  Service: Vascular;  Laterality: N/A;   EYE SURGERY     caratacs   FEMORAL ARTERY EXPLORATION  05/16/2017   Procedure: STENT OF BILATERAL COMMON FEMORAL VEIN WITH 16 X 90 WALL STENT;  Surgeon: Sheree Penne Bruckner, MD;  Location: [facility] OR;  Service: Vascular;;   hemroidectomy     INSERTION OF ILIAC STENT Bilateral 05/16/2017   Procedure: STENT OF BILATERAL COMMON AND EXTERNAL ILIAC VEIN WITH 18 X 90 WALL STENT;  Surgeon: Sheree Penne Bruckner, MD;  Location: [facility] OR;  Service: Vascular;  Laterality: Bilateral;   IVC FILTER INSERTION N/A 05/16/2017   Procedure: MECHANICAL THROMBECTOMY OF IVC FILTER;  Surgeon: Sheree Penne Bruckner, MD;  Location: [facility] OR;  Service: Vascular;  Laterality: N/A;   LAMINECTOMY     LOWER EXTREMITY VENOGRAPHY N/A 05/14/2017    Procedure: Lower Extremity Venography;  Surgeon: Serene Gaile ORN, MD;  Location: [facility];  Service: Cardiovascular;  Laterality: N/A;   NASAL SINUS SURGERY     PERCUTANEOUS VENOUS THROMBECTOMY,LYSIS WITH INTRAVASCULAR ULTRASOUND (IVUS) N/A 05/16/2017   Procedure: PERCUTANEOUS VENOUS THROMBECTOMY AND LYSIS WITH INTRAVASCULAR ULTRASOUND (IVUS);  Surgeon: Sheree Penne Bruckner, MD;  Location: [facility] OR;  Service: Vascular;  Laterality: N/A;   VENA CAVA FILTER PLACEMENT N/A 05/16/2017   Procedure: RETRIEVAL INFERIOR VENA-CAVA FILTER;  Surgeon: Sheree Penne Bruckner, MD;  Location: [facility] OR;  Service: Vascular;  Laterality: N/A;   VENOGRAM Bilateral 05/16/2017   Procedure: VENOGRAM OF IVC;  Surgeon: Sheree Penne Bruckner, MD;  Location: [facility] OR;  Service: Vascular;  Laterality: Bilateral;   Past Medical History:  Diagnosis Date  Allergy    Anemia    Arthritis    left knee   Back pain    Bipolar disorder (HCC)    Chronic headaches    COPD (chronic obstructive pulmonary disease) (HCC)    Degenerative disorder of bone    Depression    Esophageal ulcer    history   GERD (gastroesophageal reflux disease)    Hyperlipidemia    Myeloma (HCC)    Peripheral vascular disease    Pulmonary embolism (HCC)    Scoliosis    There were no vitals taken for this visit.  Opioid Risk Score:   Fall Risk Score:  `1  Depression screen [facility] 2/9     10/26/2024    1:38 PM 07/06/2024   11:27 AM 05/04/2024   11:30 AM 03/10/2024   11:21 AM 09/02/2023    1:26 PM 07/12/2023    9:05 AM 05/14/2023    9:54 AM  Depression screen PHQ 2/9  Decreased Interest 1 1 1 1  0 1 1  Down, Depressed, Hopeless 1 1 1 1  0 1 1  PHQ - 2 Score 2 2 2 2  0 2 2    Review of Systems     Objective:   Physical Exam        Assessment & Plan:    "

## 2024-12-21 ENCOUNTER — Encounter: Attending: Physical Medicine & Rehabilitation | Admitting: Registered Nurse
